# Patient Record
Sex: Female | Born: 1945 | Race: White | Hispanic: No | Smoking: Former smoker
Health system: Southern US, Community
[De-identification: ages and names within clinical notes are randomized; demographics above are authoritative.]

## PROBLEM LIST (undated history)

## (undated) DIAGNOSIS — J4520 Mild intermittent asthma, uncomplicated: Secondary | ICD-10-CM

## (undated) DIAGNOSIS — K08109 Complete loss of teeth, unspecified cause, unspecified class: Secondary | ICD-10-CM

## (undated) DIAGNOSIS — E119 Type 2 diabetes mellitus without complications: Secondary | ICD-10-CM

## (undated) DIAGNOSIS — K219 Gastro-esophageal reflux disease without esophagitis: Secondary | ICD-10-CM

## (undated) DIAGNOSIS — IMO0002 Reserved for concepts with insufficient information to code with codable children: Secondary | ICD-10-CM

## (undated) DIAGNOSIS — K5909 Other constipation: Secondary | ICD-10-CM

## (undated) DIAGNOSIS — E785 Hyperlipidemia, unspecified: Secondary | ICD-10-CM

## (undated) DIAGNOSIS — I10 Essential (primary) hypertension: Secondary | ICD-10-CM

## (undated) DIAGNOSIS — C679 Malignant neoplasm of bladder, unspecified: Secondary | ICD-10-CM

## (undated) DIAGNOSIS — M1990 Unspecified osteoarthritis, unspecified site: Secondary | ICD-10-CM

## (undated) DIAGNOSIS — Z972 Presence of dental prosthetic device (complete) (partial): Secondary | ICD-10-CM

## (undated) HISTORY — PX: WRIST SURGERY: SHX841

## (undated) HISTORY — PX: BLADDER SURGERY: SHX569

---

## 1968-11-26 HISTORY — PX: CHOLECYSTECTOMY: SHX55

## 1998-11-24 ENCOUNTER — Ambulatory Visit (HOSPITAL_COMMUNITY): Admission: RE | Admit: 1998-11-24 | Discharge: 1998-11-24 | Payer: Self-pay | Admitting: Internal Medicine

## 1999-06-10 ENCOUNTER — Encounter: Payer: Self-pay | Admitting: Emergency Medicine

## 1999-06-10 ENCOUNTER — Emergency Department (HOSPITAL_COMMUNITY): Admission: EM | Admit: 1999-06-10 | Discharge: 1999-06-10 | Payer: Self-pay | Admitting: Emergency Medicine

## 2002-10-19 ENCOUNTER — Encounter: Admission: RE | Admit: 2002-10-19 | Discharge: 2003-01-17 | Payer: Self-pay | Admitting: Internal Medicine

## 2002-12-05 ENCOUNTER — Other Ambulatory Visit: Admission: RE | Admit: 2002-12-05 | Discharge: 2002-12-05 | Payer: Self-pay | Admitting: Internal Medicine

## 2003-04-28 ENCOUNTER — Encounter: Admission: RE | Admit: 2003-04-28 | Discharge: 2003-04-28 | Payer: Self-pay | Admitting: Internal Medicine

## 2003-04-28 ENCOUNTER — Encounter: Payer: Self-pay | Admitting: Internal Medicine

## 2005-04-19 ENCOUNTER — Other Ambulatory Visit: Admission: RE | Admit: 2005-04-19 | Discharge: 2005-04-19 | Payer: Self-pay | Admitting: Internal Medicine

## 2005-09-19 ENCOUNTER — Encounter: Admission: RE | Admit: 2005-09-19 | Discharge: 2005-09-19 | Payer: Self-pay | Admitting: Internal Medicine

## 2006-08-08 ENCOUNTER — Other Ambulatory Visit: Admission: RE | Admit: 2006-08-08 | Discharge: 2006-08-08 | Payer: Self-pay | Admitting: Internal Medicine

## 2007-08-28 ENCOUNTER — Other Ambulatory Visit: Admission: RE | Admit: 2007-08-28 | Discharge: 2007-08-28 | Payer: Self-pay | Admitting: *Deleted

## 2008-09-09 ENCOUNTER — Other Ambulatory Visit: Admission: RE | Admit: 2008-09-09 | Discharge: 2008-09-09 | Payer: Self-pay | Admitting: Internal Medicine

## 2009-09-09 ENCOUNTER — Other Ambulatory Visit: Admission: RE | Admit: 2009-09-09 | Discharge: 2009-09-09 | Payer: Self-pay | Admitting: Geriatric Medicine

## 2010-09-11 ENCOUNTER — Other Ambulatory Visit: Admission: RE | Admit: 2010-09-11 | Discharge: 2010-09-11 | Payer: Self-pay | Admitting: Internal Medicine

## 2014-05-21 ENCOUNTER — Other Ambulatory Visit: Payer: Self-pay | Admitting: Urology

## 2014-05-21 ENCOUNTER — Encounter (HOSPITAL_COMMUNITY): Payer: Self-pay | Admitting: Pharmacy Technician

## 2014-05-21 ENCOUNTER — Other Ambulatory Visit (HOSPITAL_COMMUNITY): Payer: Self-pay | Admitting: *Deleted

## 2014-05-21 ENCOUNTER — Encounter (HOSPITAL_COMMUNITY): Payer: Self-pay | Admitting: *Deleted

## 2014-05-24 ENCOUNTER — Encounter (HOSPITAL_COMMUNITY): Payer: Self-pay

## 2014-05-24 ENCOUNTER — Encounter (HOSPITAL_COMMUNITY): Payer: PRIVATE HEALTH INSURANCE | Admitting: Certified Registered Nurse Anesthetist

## 2014-05-24 ENCOUNTER — Ambulatory Visit (HOSPITAL_COMMUNITY): Payer: PRIVATE HEALTH INSURANCE

## 2014-05-24 ENCOUNTER — Encounter (HOSPITAL_COMMUNITY)
Admission: RE | Disposition: A | Discharge status: Home / Self Care | Payer: Self-pay | Source: Ambulatory Visit | Attending: Urology

## 2014-05-24 ENCOUNTER — Ambulatory Visit (HOSPITAL_COMMUNITY)
Admission: RE | Admit: 2014-05-24 | Discharge: 2014-05-24 | Disposition: A | Discharge status: Home / Self Care | Payer: PRIVATE HEALTH INSURANCE | Source: Ambulatory Visit | Attending: Urology | Admitting: Urology

## 2014-05-24 ENCOUNTER — Ambulatory Visit (HOSPITAL_COMMUNITY): Payer: PRIVATE HEALTH INSURANCE | Admitting: Certified Registered Nurse Anesthetist

## 2014-05-24 DIAGNOSIS — M129 Arthropathy, unspecified: Secondary | ICD-10-CM | POA: Diagnosis not present

## 2014-05-24 DIAGNOSIS — Z7983 Long term (current) use of bisphosphonates: Secondary | ICD-10-CM | POA: Insufficient documentation

## 2014-05-24 DIAGNOSIS — Z87891 Personal history of nicotine dependence: Secondary | ICD-10-CM | POA: Diagnosis not present

## 2014-05-24 DIAGNOSIS — K219 Gastro-esophageal reflux disease without esophagitis: Secondary | ICD-10-CM | POA: Diagnosis not present

## 2014-05-24 DIAGNOSIS — C679 Malignant neoplasm of bladder, unspecified: Secondary | ICD-10-CM | POA: Diagnosis present

## 2014-05-24 DIAGNOSIS — E119 Type 2 diabetes mellitus without complications: Secondary | ICD-10-CM | POA: Diagnosis not present

## 2014-05-24 DIAGNOSIS — J45909 Unspecified asthma, uncomplicated: Secondary | ICD-10-CM | POA: Insufficient documentation

## 2014-05-24 DIAGNOSIS — I10 Essential (primary) hypertension: Secondary | ICD-10-CM | POA: Insufficient documentation

## 2014-05-24 DIAGNOSIS — E785 Hyperlipidemia, unspecified: Secondary | ICD-10-CM | POA: Insufficient documentation

## 2014-05-24 HISTORY — DX: Gastro-esophageal reflux disease without esophagitis: K21.9

## 2014-05-24 HISTORY — DX: Hyperlipidemia, unspecified: E78.5

## 2014-05-24 HISTORY — DX: Essential (primary) hypertension: I10

## 2014-05-24 HISTORY — PX: CYSTOSCOPY/RETROGRADE/URETEROSCOPY: SHX5316

## 2014-05-24 HISTORY — DX: Malignant neoplasm of bladder, unspecified: C67.9

## 2014-05-24 HISTORY — PX: TRANSURETHRAL RESECTION OF BLADDER TUMOR: SHX2575

## 2014-05-24 LAB — GLUCOSE, CAPILLARY
Glucose-Capillary: 112 mg/dL — ABNORMAL HIGH (ref 70–99)
Glucose-Capillary: 122 mg/dL — ABNORMAL HIGH (ref 70–99)

## 2014-05-24 LAB — BASIC METABOLIC PANEL
BUN: 14 mg/dL (ref 6–23)
CO2: 28 mEq/L (ref 19–32)
CREATININE: 0.69 mg/dL (ref 0.50–1.10)
Calcium: 9.1 mg/dL (ref 8.4–10.5)
Chloride: 107 mEq/L (ref 96–112)
GFR calc Af Amer: >90 mL/min (ref >90)
GFR, EST NON AFRICAN AMERICAN: 87 mL/min — AB (ref >90)
GLUCOSE: 116 mg/dL — AB (ref 70–99)
POTASSIUM: 4 meq/L (ref 3.7–5.3)
Sodium: 148 mEq/L — ABNORMAL HIGH (ref 137–147)

## 2014-05-24 LAB — CBC
HCT: 40.4 % (ref 36.0–46.0)
HEMOGLOBIN: 13.3 g/dL (ref 12.0–15.0)
MCH: 26.2 pg (ref 26.0–34.0)
MCHC: 32.9 g/dL (ref 30.0–36.0)
MCV: 79.5 fL (ref 78.0–100.0)
Platelets: 284 10*3/uL (ref 150–400)
RBC: 5.08 MIL/uL (ref 3.87–5.11)
RDW: 13.6 % (ref 11.5–15.5)
WBC: 7.1 10*3/uL (ref 4.0–10.5)

## 2014-05-24 SURGERY — TURBT (TRANSURETHRAL RESECTION OF BLADDER TUMOR)
Anesthesia: General

## 2014-05-24 MED ORDER — MITOMYCIN CHEMO FOR BLADDER INSTILLATION 40 MG
40.0000 mg | Freq: Once | INTRAVENOUS | Status: AC
  Administered 2014-05-24: 40 mg via INTRAVESICAL
  Filled 2014-05-24: qty 40

## 2014-05-24 MED ORDER — FENTANYL CITRATE 0.05 MG/ML IJ SOLN
INTRAMUSCULAR | Status: AC
  Filled 2014-05-24: qty 2

## 2014-05-24 MED ORDER — HYDROCODONE-ACETAMINOPHEN 5-325 MG PO TABS: 1.0000 | ORAL_TABLET | Freq: Once | ORAL | Status: DC | PRN

## 2014-05-24 MED ORDER — IOHEXOL 300 MG/ML  SOLN
INTRAMUSCULAR | Status: DC | PRN
  Administered 2014-05-24: 8 mL via INTRAVENOUS

## 2014-05-24 MED ORDER — MIDAZOLAM HCL 5 MG/5ML IJ SOLN
INTRAMUSCULAR | Status: DC | PRN
  Administered 2014-05-24: 2 mg via INTRAVENOUS

## 2014-05-24 MED ORDER — SODIUM CHLORIDE 0.9 % IR SOLN
Status: DC | PRN
  Administered 2014-05-24: 6000 mL via INTRAVESICAL

## 2014-05-24 MED ORDER — LACTATED RINGERS IV SOLN
INTRAVENOUS | Status: DC | PRN
  Administered 2014-05-24: 07:00:00 via INTRAVENOUS

## 2014-05-24 MED ORDER — FENTANYL CITRATE 0.05 MG/ML IJ SOLN
INTRAMUSCULAR | Status: DC | PRN
  Administered 2014-05-24 (×3): 25 ug via INTRAVENOUS

## 2014-05-24 MED ORDER — STERILE WATER FOR IRRIGATION IR SOLN
Status: DC | PRN
  Administered 2014-05-24: 1000 mL

## 2014-05-24 MED ORDER — MEPERIDINE HCL 50 MG/ML IJ SOLN
6.2500 mg | INTRAMUSCULAR | Status: DC | PRN
Start: 2014-05-24 — End: 2014-05-24

## 2014-05-24 MED ORDER — GENTAMICIN SULFATE 40 MG/ML IJ SOLN
160.0000 mg | INTRAVENOUS | Status: DC
  Filled 2014-05-24: qty 4

## 2014-05-24 MED ORDER — LIDOCAINE HCL 2 % EX GEL
CUTANEOUS | Status: AC
  Filled 2014-05-24: qty 10

## 2014-05-24 MED ORDER — ONDANSETRON HCL 4 MG/2ML IJ SOLN
INTRAMUSCULAR | Status: AC
  Filled 2014-05-24: qty 2

## 2014-05-24 MED ORDER — EPHEDRINE SULFATE 50 MG/ML IJ SOLN
INTRAMUSCULAR | Status: AC
  Filled 2014-05-24: qty 1

## 2014-05-24 MED ORDER — 0.9 % SODIUM CHLORIDE (POUR BTL) OPTIME
TOPICAL | Status: DC | PRN
  Administered 2014-05-24: 1000 mL

## 2014-05-24 MED ORDER — SODIUM CHLORIDE 0.9 % IJ SOLN
INTRAMUSCULAR | Status: AC
  Filled 2014-05-24: qty 10

## 2014-05-24 MED ORDER — PROPOFOL 10 MG/ML IV BOLUS
INTRAVENOUS | Status: AC
  Filled 2014-05-24: qty 20

## 2014-05-24 MED ORDER — BELLADONNA ALKALOIDS-OPIUM 16.2-60 MG RE SUPP
RECTAL | Status: AC
  Filled 2014-05-24: qty 1

## 2014-05-24 MED ORDER — HYDROCODONE-ACETAMINOPHEN 5-325 MG PO TABS: 1.0000 | ORAL_TABLET | ORAL | Status: DC | PRN

## 2014-05-24 MED ORDER — MITOMYCIN CHEMO FOR BLADDER INSTILLATION 40 MG: 40.0000 mg | Freq: Once | INTRAVENOUS | Status: DC

## 2014-05-24 MED ORDER — LIDOCAINE HCL (CARDIAC) 20 MG/ML IV SOLN
INTRAVENOUS | Status: AC
  Filled 2014-05-24: qty 5

## 2014-05-24 MED ORDER — PROMETHAZINE HCL 25 MG/ML IJ SOLN: 6.2500 mg | INTRAMUSCULAR | Status: DC | PRN

## 2014-05-24 MED ORDER — ONDANSETRON HCL 4 MG/2ML IJ SOLN
INTRAMUSCULAR | Status: DC | PRN
  Administered 2014-05-24: 4 mg via INTRAVENOUS

## 2014-05-24 MED ORDER — FENTANYL CITRATE 0.05 MG/ML IJ SOLN
25.0000 ug | INTRAMUSCULAR | Status: DC | PRN
  Administered 2014-05-24 (×2): 25 ug via INTRAVENOUS
  Administered 2014-05-24: 50 ug via INTRAVENOUS

## 2014-05-24 MED ORDER — FENTANYL CITRATE 0.05 MG/ML IJ SOLN
INTRAMUSCULAR | Status: AC
  Filled 2014-05-24: qty 5

## 2014-05-24 MED ORDER — PROPOFOL 10 MG/ML IV BOLUS
INTRAVENOUS | Status: DC | PRN
  Administered 2014-05-24: 170 mg via INTRAVENOUS

## 2014-05-24 MED ORDER — NITROFURANTOIN MONOHYD MACRO 100 MG PO CAPS: 100.0000 mg | ORAL_CAPSULE | Freq: Two times a day (BID) | ORAL | Status: DC

## 2014-05-24 MED ORDER — GENTAMICIN SULFATE 40 MG/ML IJ SOLN
320.0000 mg | INTRAVENOUS | Status: AC
  Administered 2014-05-24: 320 mg via INTRAVENOUS
  Filled 2014-05-24: qty 8

## 2014-05-24 MED ORDER — MIDAZOLAM HCL 2 MG/2ML IJ SOLN
INTRAMUSCULAR | Status: AC
  Filled 2014-05-24: qty 2

## 2014-05-24 MED ORDER — LIDOCAINE HCL (CARDIAC) 20 MG/ML IV SOLN
INTRAVENOUS | Status: DC | PRN
  Administered 2014-05-24: 80 mg via INTRAVENOUS

## 2014-05-24 SURGICAL SUPPLY — 28 items
BAG URINE DRAINAGE (UROLOGICAL SUPPLIES) ×2 IMPLANT
BAG URO CATCHER STRL LF (DRAPE) ×4 IMPLANT
BASKET ZERO TIP NITINOL 2.4FR (BASKET) IMPLANT
BSKT STON RTRVL ZERO TP 2.4FR (BASKET)
CATH FOLEY 2WAY  5CC 16FR SIL (CATHETERS)
CATH FOLEY 2WAY 5CC 16FR SIL (CATHETERS) IMPLANT
CATH FOLEY 2WAY SLVR  5CC 18FR (CATHETERS) ×2
CATH FOLEY 2WAY SLVR 5CC 18FR (CATHETERS) IMPLANT
CATH INTERMIT  6FR 70CM (CATHETERS) IMPLANT
CATH URET 5FR 28IN CONE TIP (BALLOONS)
CATH URET 5FR 70CM CONE TIP (BALLOONS) IMPLANT
DRAPE CAMERA CLOSED 9X96 (DRAPES) ×4 IMPLANT
ELECT REM PT RETURN 9FT ADLT (ELECTROSURGICAL) ×4
ELECTRODE REM PT RTRN 9FT ADLT (ELECTROSURGICAL) ×2 IMPLANT
EVACUATOR MICROVAS BLADDER (UROLOGICAL SUPPLIES) IMPLANT
GLOVE BIOGEL M 8.0 STRL (GLOVE) ×4 IMPLANT
GOWN STRL REUS W/ TWL XL LVL3 (GOWN DISPOSABLE) ×2 IMPLANT
GOWN STRL REUS W/TWL XL LVL3 (GOWN DISPOSABLE) ×8 IMPLANT
KIT ASPIRATION TUBING (SET/KITS/TRAYS/PACK) IMPLANT
LOOPS RESECTOSCOPE DISP (ELECTROSURGICAL) ×2 IMPLANT
MANIFOLD NEPTUNE II (INSTRUMENTS) ×4 IMPLANT
PACK CYSTO (CUSTOM PROCEDURE TRAY) ×4 IMPLANT
PLUG CATH AND CAP STER (CATHETERS) ×2 IMPLANT
SYRINGE IRR TOOMEY STRL 70CC (SYRINGE) IMPLANT
TUBING CONNECTING 10 (TUBING) ×3 IMPLANT
TUBING CONNECTING 10' (TUBING) ×1
WATER STERILE IRR 3000ML UROMA (IV SOLUTION) ×4 IMPLANT
WIRE COONS/BENSON .038X145CM (WIRE) ×2 IMPLANT

## 2014-05-24 NOTE — Op Note (Signed)
PATIENT:  Teresa Rice  PRE-OPERATIVE DIAGNOSIS: History of bladder cancer with 2 papillary recurrences  POST-OPERATIVE DIAGNOSIS: Same  PROCEDURE: Cystoscopy, left retrograde ureteropyelogram with interpretive fluoroscopy, TURBT of 2 lesions, aggregate diameter 2 cm, placement of mitomycin  SURGEON:  Lillette Boxer. Dahlstedt, M.D.  ANESTHESIA:  General  EBL:  Minimal  DRAINS: 18 French Foley catheter  LOCAL MEDICATIONS USED:  None  SPECIMEN:  1. Left ureteral orifice lesion 2. Posterior bladder lesion  INDICATION: Teresa Rice is a 68 year old female who presents at this time for cystoscopy, TURBT, left retrograde and possible ureteroscopy. Her initial TURBT was performed by Dr. Solon Augusta in [place] in 2012. Followup since then has been negative until last week when she was found to have 2 papillary recurrences-one of the left ureteral orifice, one in the midline posterior bladder. She presents at this time for left retrograde to rule out upper tract lesions as well as TURBT.  Description of procedure: The patient was properly identified and marked (if applicable) in the holding area. They were then  taken to the operating room and placed on the table in a supine position. General anesthesia was then administered. Once fully anesthetized the patient was moved to the dorsolithotomy position and the genitalia and perineum were sterilely prepped and draped in standard fashion. An official timeout was then performed.  A 22 French panendoscope was passed into her bladder. Her bladder was inspected circumferentially. There were no trabeculations or foreign bodies. Ureteral orifices were normal in configuration and location. Both the 12 and 70 lenses were used to inspect the bladder. 2 areas of papillary tumors were noted-there was a 4-5 mm lesion just lateral to the left ureteral orifice. There were flat papillary lesions in the midline of the posterior bladder, these were  approximately 15 mm in size. No other lesions were noted. X-rays were taken.  I cannulated the left ureteral orifice with a 6 [place] open-ended catheter. Retrograde ureteropyelogram was performed using Omnipaque. The ureter was of normal diameter. There were no filling defects. There was no stricture. Renal pelvis and calyces were normal.  At this point, with the cold cup biopsy forceps, I resected the lesion at the ureteral orifice. This was sent as left ureteral orifice lesion. I use caution not to resect orifice itself. The tumor was resected totally. Separately, I resected with the cold cup biopsy forceps the posterior bladder wall lesion. It took great caution to perform wide resection here. The biopsy was taken as well. All visible tumor was resected.  At this point, I passed the Bugbee electrode. The posterior bladder wall biopsy site was cauterized, with a wide margin used and cauterizing outside of the resected sites. I also cauterized, and carefully, the biopsy site of the left ureteral orifice, and taking caution not to cauterize the orifice itself. At this point, there was adequate hemostasis.  The bladder was drained. A placed an 16 French Foley catheter. The balloon was filled with 10 cc of water. The catheter was plugged.  The patient was awakened and taken to PACU in stable condition. In the PACU, I placed 40 mg of mitomycin. It would be left indwelling for an hour.    PLAN OF CARE: Discharge to home after PACU  PATIENT DISPOSITION:  PACU - hemodynamically stable.

## 2014-05-24 NOTE — H&P (Signed)
H&P  Chief Complaint: Bladder cancer  History of Present Illness: Teresa Rice is a 68 y.o. year old female who presents today for management of recurrent bladder cancer. She underwent an initial resection in [place] in 2012. She 1st saw me in 2014 for cysto, at which time was negative. Recent surveillance cystoscopy revealed 2 papillary recurrences-1 at her left ureteral orifice and 1 above her trigone.   I have recommended TURBT of her recurrences as well as left RGP/possible ureteroscopy.  Past Medical History  Diagnosis Date  . Hyperlipidemia   . Hypertension   . Asthma     infrequent problem  . Arthritis   . GERD (gastroesophageal reflux disease)   . Bladder cancer     DX 2012  . Diabetes mellitus without complication     Past Surgical History  Procedure Laterality Date  . Bladder surgery  2012  . Cholecystectomy  1970    Home Medications:  Medications Prior to Admission  Medication Sig Dispense Refill  . ALPRAZolam (XANAX) 1 MG tablet Take 0.5-1 mg by mouth 4 (four) times daily - after meals and at bedtime. 0.5 tablet tid and 1 tablet at hs      . alendronate (FOSAMAX) 70 MG tablet Take 70 mg by mouth every Monday. Take with a full glass of water on an empty stomach.      . Calcium-Vitamin D (CALTRATE 600 PLUS-VIT D PO) Take 1 tablet by mouth 2 (two) times daily.      . cetirizine (ZYRTEC) 10 MG tablet Take 10 mg by mouth daily.      Marland Kitchen esomeprazole (NEXIUM) 40 MG capsule Take 40 mg by mouth daily at 12 noon.      Marland Kitchen lisinopril (PRINIVIL,ZESTRIL) 20 MG tablet Take 20 mg by mouth every morning.      . magnesium oxide (MAG-OX) 400 MG tablet Take 400 mg by mouth 2 (two) times daily.      . metFORMIN (GLUCOPHAGE) 500 MG tablet Take 500 mg by mouth 2 (two) times daily with a meal.      . Multiple Vitamin (MULTIVITAMIN WITH MINERALS) TABS tablet Take 1 tablet by mouth daily.      Marland Kitchen olopatadine (PATANOL) 0.1 % ophthalmic solution Place 1 drop into both eyes 2 (two) times daily.       . Omega 3 1000 MG CAPS Take 1,000 mg by mouth daily.      . rosuvastatin (CRESTOR) 10 MG tablet Take 10 mg by mouth every evening.      . sitaGLIPtin (JANUVIA) 100 MG tablet Take 100 mg by mouth every morning.        Allergies:  Allergies  Allergen Reactions  . Celebrex [Celecoxib] Anaphylaxis  . Penicillins Other (See Comments)    Burns marks   . Sulfa Antibiotics Itching    History reviewed. No pertinent family history.  Social History:  reports that she quit smoking about 20 years ago. She does not have any smokeless tobacco history on file. She reports that she drinks alcohol. She reports that she does not use illicit drugs.  ROS: A complete review of systems was performed.  All systems are negative except for pertinent findings as noted.  Physical Exam:  Vital signs in last 24 hours:   General:  Alert and oriented, No acute distress HEENT: Normocephalic, atraumatic Neck: No JVD or lymphadenopathy Cardiovascular: Regular rate and rhythm Lungs: Clear bilaterally Abdomen: Soft, nontender, nondistended, no abdominal masses Back: No CVA tenderness Extremities: No edema Neurologic:  Grossly intact  Laboratory Data:  No results found for this or any previous visit (from the past 24 hour(s)). No results found for this or any previous visit (from the past 240 hour(s)). Creatinine: No results found for this basename: CREATININE,  in the last 168 hours  Radiologic Imaging: No results found.  Impression/Assessment:  Recurrent NMIBC  Plan:  Cystoscopy, left RGP/possible ureteroscopy, TURBT  DAHLSTEDT, STEPHEN M 05/24/2014, 5:34 AM  Lillette Boxer. Dahlstedt MD

## 2014-05-24 NOTE — Discharge Instructions (Signed)
Teresa Rice  DOB Oct 27, 2046 DOS 05/24/14  PT INSTRUCTED TO ARRIVE AT [facility] ON 05/24/14 AT 5:15 AM  INSTRUCTED NPO AFTER MIDNIGHT EXCEPT TO TAKE [place]  INSTRUCTED ON BETASEPT PROTOCOL  D REED RN Transurethral Resection of Bladder Tumor (TURBT)  Definition:  Transurethral Resection of the Bladder Tumor is a surgical procedure used to diagnose and remove tumors within the bladder. TURBT is the most common treatment for early stage bladder cancer.   General instructions:  Your recent bladder surgery requires very little post hospital care but some definite precautions.  Despite the fact that no skin incisions were used, the area around the tumor removal site is raw and covered with scabs to promote healing and prevent bleeding. Certain precautions are needed to insure that the scabs are not disturbed over the next 2-4 weeks while the healing proceeds.  Because the raw surface inside your bladder and the irritating effects of urine you may expect frequency of urination and/or urgency (a stronger desire to urinate) and perhaps even getting up at night more often. This will usually resolve or improve slowly over the healing period. You may see some blood in your urine over the first 6 weeks. Do not be alarmed, even if the urine was clear for a while. Get off your feet and drink lots of fluids until clearing occurs. If you start to pass clots or don't improve call us.  Remove the catheter as instructed on Tuesday morning. Diet:  You may return to your normal diet immediately. Because of the raw surface of your bladder, alcohol, spicy foods, foods high in acid and drinks with caffeine may cause irritation or frequency and should be used in moderation. To keep your urine flowing freely and avoid constipation, drink plenty of fluids during the day (8-10 glasses). Tip: Avoid cranberry juice because it is very acidic.   Activity:  Your physical activity  needs to be restricted somewhat.  We suggest that you reduce your activity under the circumstances until the bleeding has stopped. Heavy lifting (greater than 20 lbs.) and heavy exertion should be limited for 2-3 weeks.  Bowels:  It is important to keep your bowels regular during the postoperative period. Straining with bowel movements can cause bleeding. A bowel movement every other day is reasonable. Use a mild laxative if needed, such as milk of magnesia 2-3 tablespoons, or 2 Dulcolax tablets. Call if you continue to have problems. If you had been taking narcotics for pain, before, during or after your surgery, you may be constipated.   Medication:  You should resume your pre-surgery medications unless told not to. In addition you may be given an antibiotic to prevent or treat infection. Antibiotics are not always necessary. All medication should be taken as prescribed until the bottles are finished unless you are having an unusual reaction to one of the drugs.  General Anesthetic, Adult  A doctor specialized in giving anesthesia (anesthesiologist) or a nurse specialized in giving anesthesia (nurse anesthetist) gives medicine that makes you sleep while a procedure is performed (general anesthetic). Once the general anesthetic has been administered, you will be in a sleeplike state in which you feel no pain. After having a general anesthetic you may feel:  Dizzy.  Weak.  Drowsy.  Confused.  These feelings are normal and can be expected to last for up to 24 hours after the procedure.   LET YOUR CAREGIVER KNOW ABOUT:  Allergies you have.  Medications you are taking, including herbs, eye drops, over the counter medications, dietary supplements, and creams.  Previous problems you have had with anesthetics or numbing medicines.  Use of cigarettes, alcohol, or illicit drugs.  Possibility of pregnancy, if this applies.  History of bleeding or blood disorders, including blood clots and clotting  disorders.  Previous surgeries you have had and types of anesthetics you have received.  Family medical history, especially anesthetic problems.  Other health problems.   AFTER THE PROCEDURE  After surgery, you will be taken to the recovery area where a nurse will monitor your progress. You will be allowed to go home when you are awake, stable, taking fluids well, and without serious pain or complications.  For the first 24 hours following an anesthetic:  Have a responsible person with you.  Do not drive a car. If you are alone, do not take public transportation.  Do not engage in strenuous activity. You may usually resume normal activities the next day, or as advised by your caregiver.  Do not drink alcohol.  Do not take medicine that has not been prescribed by your caregiver.  Do not sign important papers or make important decisions as your judgement may be impaired.  You may resume a normal diet as directed.  Change bandages (dressings) as directed.  Only take over-the-counter or prescription medicines for pain, discomfort, or fever as directed by your caregiver.  If you have questions or problems that seem related to the anesthetic, call the hospital and ask for the anesthetist, anesthesiologist, or anesthesia department.   SEEK IMMEDIATE MEDICAL CARE IF:  You develop a rash.  You have difficulty breathing.  You have chest pain.  You have allergic problems.  You have uncontrolled nausea.  You have uncontrolled vomiting.  You develop any serious bleeding, especially from the incision site.  Document Released: 02/19/2008 Document Revised: 07/25/2011 Document Reviewed: 03/15/2011  [facility] Patient Information 2012 [place].

## 2014-05-24 NOTE — Anesthesia Postprocedure Evaluation (Signed)
  Anesthesia Post-op Note  Patient: Teresa Rice  Procedure(s) Performed: Procedure(s) (LRB): TRANSURETHRAL RESECTION OF BLADDER TUMOR (TURBT)  (N/A) CYSTOSCOPY/RETROGRADE/ POSSIBLE URETEROSCOPY (Left)  Patient Location: PACU  Anesthesia Type: General  Level of Consciousness: awake and alert   Airway and Oxygen Therapy: Patient Spontanous Breathing  Post-op Pain: mild  Post-op Assessment: Post-op Vital signs reviewed, Patient's Cardiovascular Status Stable, Respiratory Function Stable, Patent Airway and No signs of Nausea or vomiting  Last Vitals:  Filed Vitals:   05/24/14 1117  BP: 133/64  Pulse: 78  Temp:   Resp: 16    Post-op Vital Signs: stable   Complications: No apparent anesthesia complications

## 2014-05-24 NOTE — Transfer of Care (Signed)
Immediate Anesthesia Transfer of Care Note  Patient: Teresa Rice  Procedure(s) Performed: Procedure(s): TRANSURETHRAL RESECTION OF BLADDER TUMOR (TURBT)  (N/A) CYSTOSCOPY/RETROGRADE/ POSSIBLE URETEROSCOPY (Left)  Patient Location: PACU  Anesthesia Type:General  Level of Consciousness: awake, alert , oriented and patient cooperative  Airway & Oxygen Therapy: Patient Spontanous Breathing and Patient connected to face mask oxygen  Post-op Assessment: Report given to PACU RN, Post -op Vital signs reviewed and stable and Patient moving all extremities  Post vital signs: Reviewed and stable  Complications: No apparent anesthesia complications

## 2014-05-24 NOTE — Anesthesia Preprocedure Evaluation (Addendum)
Anesthesia Evaluation  Patient identified by MRN, date of birth, ID band Patient awake    Reviewed: Allergy & Precautions, H&P , NPO status , Patient's Chart, lab work & pertinent test results  Airway Mallampati: II TM Distance: >3 FB Neck ROM: Full    Dental no notable dental hx. (+) Edentulous Lower, Edentulous Upper   Pulmonary asthma , former smoker,  breath sounds clear to auscultation  Pulmonary exam normal       Cardiovascular hypertension, Pt. on medications Rhythm:Regular Rate:Normal     Neuro/Psych negative neurological ROS  negative psych ROS   GI/Hepatic negative GI ROS, Neg liver ROS,   Endo/Other  diabetes  Renal/GU negative Renal ROS  negative genitourinary   Musculoskeletal negative musculoskeletal ROS (+)   Abdominal   Peds negative pediatric ROS (+)  Hematology negative hematology ROS (+)   Anesthesia Other Findings   Reproductive/Obstetrics negative OB ROS                          Anesthesia Physical Anesthesia Plan  ASA: III  Anesthesia Plan: General   Post-op Pain Management:    Induction: Intravenous  Airway Management Planned: LMA  Additional Equipment:   Intra-op Plan:   Post-operative Plan: Extubation in OR  Informed Consent: I have reviewed the patients History and Physical, chart, labs and discussed the procedure including the risks, benefits and alternatives for the proposed anesthesia with the patient or authorized representative who has indicated his/her understanding and acceptance.   Dental advisory given  Plan Discussed with: CRNA  Anesthesia Plan Comments:         Anesthesia Quick Evaluation

## 2014-05-24 NOTE — Progress Notes (Signed)
pacu note:  Dentures and glasses returned to patient in pacu per patient request.

## 2014-05-25 ENCOUNTER — Encounter (HOSPITAL_COMMUNITY): Payer: Self-pay | Admitting: Urology

## 2015-07-22 ENCOUNTER — Other Ambulatory Visit: Payer: Self-pay | Admitting: Urology

## 2015-07-28 ENCOUNTER — Encounter (HOSPITAL_BASED_OUTPATIENT_CLINIC_OR_DEPARTMENT_OTHER): Payer: Self-pay | Admitting: *Deleted

## 2015-08-02 ENCOUNTER — Encounter (HOSPITAL_BASED_OUTPATIENT_CLINIC_OR_DEPARTMENT_OTHER): Payer: Self-pay | Admitting: *Deleted

## 2015-08-02 NOTE — Progress Notes (Signed)
NPO AFTER MN.  ARRIVE AT 0700,.  NEEDS ISTAT 8 AND EKG.   WILL TAKE XANAX, ZYRTEC, AND ZANTAC AM DOS W/ SIPS OF WATER.

## 2015-08-03 NOTE — H&P (Signed)
  H&P  Chief Complaint: Bladder cancer  History of Present Illness: Teresa Rice is a 69 y.o. year old female who presents for TURBT of recurrent bladder cancer.  Past Medical History  Diagnosis Date  . Hyperlipidemia   . Hypertension   . GERD (gastroesophageal reflux disease)   . Bladder cancer first dx 2012    Recurrent Bladder Cancer--  (urologist-  dr Diona Fanti)  . Mild intermittent asthma   . Type 2 diabetes mellitus   . Chronic constipation   . OA (osteoarthritis)     fingers   . Trigger finger of both hands     wear slints and special gloves at night  . Full dentures     Past Surgical History  Procedure Laterality Date  . Bladder surgery  2012   in [place]    TURBT  . Cholecystectomy  1970  . Transurethral resection of bladder tumor N/A 05/24/2014    Procedure: TRANSURETHRAL RESECTION OF BLADDER TUMOR (TURBT) ;  Surgeon: Jorja Loa, MD;  Location: [facility];  Service: Urology;  Laterality: N/A;  . Cystoscopy/retrograde/ureteroscopy Left 05/24/2014    Procedure: CYSTOSCOPY/RETROGRADE/ URETEROSCOPY;  Surgeon: Jorja Loa, MD;  Location: [facility];  Service: Urology;  Laterality: Left;    Home Medications:  No prescriptions prior to admission    Allergies:  Allergies  Allergen Reactions  . Celebrex [Celecoxib] Anaphylaxis  . Glipizide Itching  . Penicillins Other (See Comments)    "Burn marks from inside out"   . Sulfa Antibiotics Itching    History reviewed. No pertinent family history.  Social History:  reports that she quit smoking about 21 years ago. Her smoking use included Cigarettes. She quit after 30 years of use. She has never used smokeless tobacco. She reports that she drinks alcohol. She reports that she does not use illicit drugs.  ROS: A complete review of systems was performed.  All systems are negative except for pertinent findings as noted.  Physical Exam:  Vital signs in last 24 hours:   General:  Alert and oriented, No  acute distress HEENT: Normocephalic, atraumatic Neck: No JVD or lymphadenopathy Cardiovascular: Regular rate and rhythm Lungs: Clear bilaterally Abdomen: Soft, nontender, nondistended, no abdominal masses Back: No CVA tenderness Extremities: No edema Neurologic: Grossly intact  Laboratory Data:  No results found for this or any previous visit (from the past 24 hour(s)). No results found for this or any previous visit (from the past 240 hour(s)). Creatinine: No results for input(s): CREATININE in the last 168 hours.  Radiologic Imaging: No results found.  Impression/Assessment:  Recurrent bladder cancer  Plan:  TURBT  Jorja Loa 08/03/2015, 8:37 PM  Lillette Boxer. Alysse Rathe MD

## 2015-08-04 ENCOUNTER — Encounter (HOSPITAL_BASED_OUTPATIENT_CLINIC_OR_DEPARTMENT_OTHER)
Admission: RE | Disposition: A | Discharge status: Home / Self Care | Payer: Self-pay | Source: Ambulatory Visit | Attending: Urology

## 2015-08-04 ENCOUNTER — Ambulatory Visit (HOSPITAL_BASED_OUTPATIENT_CLINIC_OR_DEPARTMENT_OTHER)
Admission: RE | Admit: 2015-08-04 | Discharge: 2015-08-04 | Disposition: A | Discharge status: Home / Self Care | Payer: Medicare Other | Source: Ambulatory Visit | Attending: Urology | Admitting: Urology

## 2015-08-04 ENCOUNTER — Other Ambulatory Visit: Payer: Self-pay

## 2015-08-04 ENCOUNTER — Ambulatory Visit (HOSPITAL_BASED_OUTPATIENT_CLINIC_OR_DEPARTMENT_OTHER): Payer: Medicare Other | Admitting: Anesthesiology

## 2015-08-04 ENCOUNTER — Encounter (HOSPITAL_BASED_OUTPATIENT_CLINIC_OR_DEPARTMENT_OTHER): Payer: Self-pay | Admitting: *Deleted

## 2015-08-04 DIAGNOSIS — I10 Essential (primary) hypertension: Secondary | ICD-10-CM | POA: Insufficient documentation

## 2015-08-04 DIAGNOSIS — N3020 Other chronic cystitis without hematuria: Secondary | ICD-10-CM | POA: Insufficient documentation

## 2015-08-04 DIAGNOSIS — E785 Hyperlipidemia, unspecified: Secondary | ICD-10-CM | POA: Insufficient documentation

## 2015-08-04 DIAGNOSIS — M19049 Primary osteoarthritis, unspecified hand: Secondary | ICD-10-CM | POA: Insufficient documentation

## 2015-08-04 DIAGNOSIS — K219 Gastro-esophageal reflux disease without esophagitis: Secondary | ICD-10-CM | POA: Diagnosis not present

## 2015-08-04 DIAGNOSIS — J4520 Mild intermittent asthma, uncomplicated: Secondary | ICD-10-CM | POA: Diagnosis not present

## 2015-08-04 DIAGNOSIS — Z87891 Personal history of nicotine dependence: Secondary | ICD-10-CM | POA: Diagnosis not present

## 2015-08-04 DIAGNOSIS — C679 Malignant neoplasm of bladder, unspecified: Secondary | ICD-10-CM | POA: Insufficient documentation

## 2015-08-04 DIAGNOSIS — E119 Type 2 diabetes mellitus without complications: Secondary | ICD-10-CM | POA: Diagnosis not present

## 2015-08-04 DIAGNOSIS — C674 Malignant neoplasm of posterior wall of bladder: Secondary | ICD-10-CM

## 2015-08-04 HISTORY — DX: Other constipation: K59.09

## 2015-08-04 HISTORY — DX: Reserved for concepts with insufficient information to code with codable children: IMO0002

## 2015-08-04 HISTORY — DX: Mild intermittent asthma, uncomplicated: J45.20

## 2015-08-04 HISTORY — PX: TRANSURETHRAL RESECTION OF BLADDER TUMOR: SHX2575

## 2015-08-04 HISTORY — DX: Type 2 diabetes mellitus without complications: E11.9

## 2015-08-04 HISTORY — DX: Complete loss of teeth, unspecified cause, unspecified class: K08.109

## 2015-08-04 HISTORY — DX: Unspecified osteoarthritis, unspecified site: M19.90

## 2015-08-04 HISTORY — DX: Presence of dental prosthetic device (complete) (partial): Z97.2

## 2015-08-04 LAB — GLUCOSE, CAPILLARY: GLUCOSE-CAPILLARY: 124 mg/dL — AB (ref 65–99)

## 2015-08-04 LAB — POCT I-STAT, CHEM 8
BUN: 20 mg/dL (ref 6–20)
CREATININE: 0.7 mg/dL (ref 0.44–1.00)
Calcium, Ion: 1.29 mmol/L (ref 1.13–1.30)
Chloride: 103 mmol/L (ref 101–111)
GLUCOSE: 129 mg/dL — AB (ref 65–99)
HCT: 39 % (ref 36.0–46.0)
HEMOGLOBIN: 13.3 g/dL (ref 12.0–15.0)
POTASSIUM: 4 mmol/L (ref 3.5–5.1)
Sodium: 140 mmol/L (ref 135–145)
TCO2: 23 mmol/L (ref 0–100)

## 2015-08-04 SURGERY — TURBT (TRANSURETHRAL RESECTION OF BLADDER TUMOR)
Anesthesia: General | Site: Bladder

## 2015-08-04 MED ORDER — LACTATED RINGERS IV SOLN
INTRAVENOUS | Status: DC
  Administered 2015-08-04: 08:00:00 via INTRAVENOUS
  Filled 2015-08-04: qty 1000

## 2015-08-04 MED ORDER — NITROFURANTOIN MONOHYD MACRO 100 MG PO CAPS: 100.0000 mg | ORAL_CAPSULE | Freq: Two times a day (BID) | ORAL | Status: DC

## 2015-08-04 MED ORDER — FENTANYL CITRATE (PF) 100 MCG/2ML IJ SOLN
INTRAMUSCULAR | Status: AC
  Filled 2015-08-04: qty 4

## 2015-08-04 MED ORDER — FENTANYL CITRATE (PF) 100 MCG/2ML IJ SOLN
INTRAMUSCULAR | Status: DC | PRN
  Administered 2015-08-04: 50 ug via INTRAVENOUS

## 2015-08-04 MED ORDER — HYDROCODONE-ACETAMINOPHEN 5-325 MG PO TABS
ORAL_TABLET | ORAL | Status: AC
  Filled 2015-08-04: qty 1

## 2015-08-04 MED ORDER — HYDROCODONE-ACETAMINOPHEN 5-325 MG PO TABS: 1.0000 | ORAL_TABLET | ORAL | Status: DC | PRN

## 2015-08-04 MED ORDER — ONDANSETRON HCL 4 MG/2ML IJ SOLN
INTRAMUSCULAR | Status: DC | PRN
  Administered 2015-08-04: 4 mg via INTRAVENOUS

## 2015-08-04 MED ORDER — GENTAMICIN SULFATE 40 MG/ML IJ SOLN
5.0000 mg/kg | INTRAVENOUS | Status: AC
  Administered 2015-08-04: 320 mg via INTRAVENOUS
  Filled 2015-08-04: qty 8

## 2015-08-04 MED ORDER — HYDROMORPHONE HCL 1 MG/ML IJ SOLN
0.2500 mg | INTRAMUSCULAR | Status: DC | PRN
  Filled 2015-08-04: qty 1

## 2015-08-04 MED ORDER — HYDROMORPHONE HCL 1 MG/ML IJ SOLN
INTRAMUSCULAR | Status: AC
  Filled 2015-08-04: qty 1

## 2015-08-04 MED ORDER — LIDOCAINE HCL (CARDIAC) 20 MG/ML IV SOLN
INTRAVENOUS | Status: DC | PRN
  Administered 2015-08-04: 60 mg via INTRAVENOUS

## 2015-08-04 MED ORDER — STERILE WATER FOR IRRIGATION IR SOLN
Status: DC | PRN
  Administered 2015-08-04: 3000 mL

## 2015-08-04 MED ORDER — DEXAMETHASONE SODIUM PHOSPHATE 4 MG/ML IJ SOLN
INTRAMUSCULAR | Status: DC | PRN
  Administered 2015-08-04: 10 mg via INTRAVENOUS

## 2015-08-04 MED ORDER — MIDAZOLAM HCL 5 MG/5ML IJ SOLN
INTRAMUSCULAR | Status: DC | PRN
  Administered 2015-08-04: 2 mg via INTRAVENOUS

## 2015-08-04 MED ORDER — PROPOFOL 10 MG/ML IV BOLUS
INTRAVENOUS | Status: DC | PRN
  Administered 2015-08-04: 140 mg via INTRAVENOUS

## 2015-08-04 MED ORDER — HYDROCODONE-ACETAMINOPHEN 5-325 MG PO TABS
1.0000 | ORAL_TABLET | Freq: Four times a day (QID) | ORAL | Status: DC | PRN
  Administered 2015-08-04: 1 via ORAL
  Filled 2015-08-04: qty 2

## 2015-08-04 MED ORDER — MEPERIDINE HCL 25 MG/ML IJ SOLN
6.2500 mg | INTRAMUSCULAR | Status: DC | PRN
  Filled 2015-08-04: qty 1

## 2015-08-04 MED ORDER — OXYBUTYNIN CHLORIDE 5 MG PO TABS: 5.0000 mg | ORAL_TABLET | Freq: Three times a day (TID) | ORAL | Status: DC | PRN

## 2015-08-04 MED ORDER — MIDAZOLAM HCL 2 MG/2ML IJ SOLN
INTRAMUSCULAR | Status: AC
  Filled 2015-08-04: qty 2

## 2015-08-04 MED ORDER — PROMETHAZINE HCL 25 MG/ML IJ SOLN
6.2500 mg | INTRAMUSCULAR | Status: DC | PRN
  Filled 2015-08-04: qty 1

## 2015-08-04 SURGICAL SUPPLY — 34 items
BAG DRAIN URO-CYSTO SKYTR STRL (DRAIN) ×4 IMPLANT
BAG DRN ANRFLXCHMBR STRAP LEK (BAG) ×2
BAG DRN UROCATH (DRAIN) ×2
BAG URINE DRAINAGE (UROLOGICAL SUPPLIES) IMPLANT
BAG URINE LEG 19OZ MD ST LTX (BAG) ×3 IMPLANT
CANISTER SUCT LVC 12 LTR MEDI- (MISCELLANEOUS) IMPLANT
CATH FOLEY 2WAY SLVR  5CC 20FR (CATHETERS)
CATH FOLEY 2WAY SLVR  5CC 22FR (CATHETERS)
CATH FOLEY 2WAY SLVR  5CC 24FR (CATHETERS) ×2
CATH FOLEY 2WAY SLVR 5CC 20FR (CATHETERS) IMPLANT
CATH FOLEY 2WAY SLVR 5CC 22FR (CATHETERS) IMPLANT
CATH FOLEY 2WAY SLVR 5CC 24FR (CATHETERS) ×1 IMPLANT
CATH FOLEY 3WAY 30CC 22F (CATHETERS) IMPLANT
CLOTH BEACON ORANGE TIMEOUT ST (SAFETY) ×4 IMPLANT
ELECT BUTTON BIOP 24F 90D PLAS (MISCELLANEOUS) IMPLANT
ELECT REM PT RETURN 9FT ADLT (ELECTROSURGICAL) ×4
ELECTRODE REM PT RTRN 9FT ADLT (ELECTROSURGICAL) ×2 IMPLANT
EVACUATOR MICROVAS BLADDER (UROLOGICAL SUPPLIES) IMPLANT
GLOVE BIO SURGEON STRL SZ8 (GLOVE) ×4 IMPLANT
GLOVE BIOGEL PI IND STRL 6.5 (GLOVE) ×2 IMPLANT
GLOVE BIOGEL PI INDICATOR 6.5 (GLOVE) ×4
GLOVE ECLIPSE 6.5 STRL STRAW (GLOVE) ×3 IMPLANT
GOWN STRL REUS W/ TWL XL LVL3 (GOWN DISPOSABLE) ×3 IMPLANT
GOWN STRL REUS W/TWL XL LVL3 (GOWN DISPOSABLE) ×8
GOWN XL W/COTTON TOWEL STD (GOWNS) ×1 IMPLANT
HOLDER FOLEY CATH W/STRAP (MISCELLANEOUS) IMPLANT
MANIFOLD NEPTUNE II (INSTRUMENTS) ×3 IMPLANT
NEEDLE HYPO 22GX1.5 SAFETY (NEEDLE) IMPLANT
NS IRRIG 500ML POUR BTL (IV SOLUTION) IMPLANT
PACK CYSTO (CUSTOM PROCEDURE TRAY) ×4 IMPLANT
PLUG CATH AND CAP STER (CATHETERS) IMPLANT
SET ASPIRATION TUBING (TUBING) IMPLANT
SYRINGE IRR TOOMEY STRL 70CC (SYRINGE) IMPLANT
WATER STERILE IRR 3000ML UROMA (IV SOLUTION) ×4 IMPLANT

## 2015-08-04 NOTE — Op Note (Signed)
PATIENT:  Teresa Rice  PRE-OPERATIVE DIAGNOSIS:   POST-OPERATIVE DIAGNOSIS: Same  PROCEDURE:   SURGEON:  Lillette Boxer. Lynden Flemmer, M.D.  ANESTHESIA:  General  EBL:  Minimal  DRAINS: None  LOCAL MEDICATIONS USED:  None  SPECIMEN:    INDICATION: [name] is a 69 year old female with a history of bladder cancer. Her initial resection was in [place] several years ago. Recently, during routine surveillance cystoscopy, she was noted to have a recurrence. She presents at this time for repeat TURBT. Risks and complications the procedure have been discussed with the patient, as well as the routine use of mitomycin postoperatively. She agrees with the procedure and desires to proceed.  Description of procedure: The patient was properly identified and marked (if applicable) in the holding area. They were then  taken to the operating room and placed on the table in a supine position. General anesthesia was then administered. Once fully anesthetized the patient was moved to the dorsolithotomy position and the genitalia and perineum were sterilely prepped and draped in standard fashion. An official timeout was then performed.  A 23 French cystoscope was passed into her bladder. The bladder was inspected circumferentially. No foreign bodies were identified. Both ureteral orifices were normal in configuration and location. Bladder abnormalities were as follows-a 1 cm papillary tumor located posteriorly on the left, a 6 mm papillary lesion located at the left bladder neck at approximately the 4 clock position, approximately 1 cm below the left ureteral orifice. 1 erythematous lesion without papillary morphology just above and medial to the left ureteral orifice. No other abnormalities were noted.  Using the cold cup biopsy forceps, I totally excise both of the papillary lesions. These were sent, labeled "bladder tumor". When the posterior bladder lesion was removed, a small amount of fat was  evident through the bladder wall after excision. For this reason, I did not feel it mitomycin use was judicious. Bladder lesions/tumors were totally removed with the cold cup biopsy forceps.  I then biopsied the flat erythematous lesion separately. This was sent as "posterior bladder lesion". The entire lesion was removed. I then used the Bugbee electrode to cauterize all biopsy sites. Hemostasis was excellent.  At this point, the scope was removed. An 14 French Foley catheter was then placed to direct drainage. The balloon was filled with 10 mL of water. The patient was awakened and taken to the PACU in stable condition.    PLAN OF CARE: Discharge to home after PACU  PATIENT DISPOSITION:  PACU - hemodynamically stable.

## 2015-08-04 NOTE — Anesthesia Preprocedure Evaluation (Addendum)
Anesthesia Evaluation  Patient identified by MRN, date of birth, ID band Patient awake    Reviewed: Allergy & Precautions, H&P , NPO status , Patient's Chart, lab work & pertinent test results  Airway Mallampati: II  TM Distance: >3 FB Neck ROM: Full    Dental no notable dental hx. (+) Edentulous Lower, Upper Dentures, Dental Advisory Given   Pulmonary asthma , former smoker,    Pulmonary exam normal breath sounds clear to auscultation       Cardiovascular hypertension, Pt. on medications Normal cardiovascular exam Rhythm:Regular Rate:Normal     Neuro/Psych negative neurological ROS  negative psych ROS   GI/Hepatic Neg liver ROS, GERD  Medicated and Controlled,  Endo/Other  diabetes, Well Controlled, Type 2, Oral Hypoglycemic Agents  Renal/GU negative Renal ROS     Musculoskeletal  (+) Arthritis , Osteoarthritis,    Abdominal   Peds  Hematology negative hematology ROS (+)   Anesthesia Other Findings   Reproductive/Obstetrics negative OB ROS                           Anesthesia Physical  Anesthesia Plan  ASA: III  Anesthesia Plan: General   Post-op Pain Management:    Induction: Intravenous  Airway Management Planned: LMA  Additional Equipment:   Intra-op Plan:   Post-operative Plan: Extubation in OR  Informed Consent: I have reviewed the patients History and Physical, chart, labs and discussed the procedure including the risks, benefits and alternatives for the proposed anesthesia with the patient or authorized representative who has indicated his/her understanding and acceptance.   Dental advisory given  Plan Discussed with: CRNA  Anesthesia Plan Comments:         Anesthesia Quick Evaluation

## 2015-08-04 NOTE — Anesthesia Procedure Notes (Signed)
Procedure Name: LMA Insertion Date/Time: 08/04/2015 8:31 AM Performed by: Wanita Chamberlain Pre-anesthesia Checklist: Patient identified, Timeout performed, Emergency Drugs available, Suction available and Patient being monitored Patient Re-evaluated:Patient Re-evaluated prior to inductionOxygen Delivery Method: Circle system utilized Intubation Type: IV induction Ventilation: Mask ventilation without difficulty LMA: LMA inserted LMA Size: 4.0 Placement Confirmation: breath sounds checked- equal and bilateral and positive ETCO2 Tube secured with: Tape Dental Injury: Teeth and Oropharynx as per pre-operative assessment

## 2015-08-04 NOTE — Transfer of Care (Signed)
Immediate Anesthesia Transfer of Care Note  Patient: Teresa Rice  Procedure(s) Performed: Procedure(s): TRANSURETHRAL RESECTION OF BLADDER TUMOR (TURBT) (N/A)  Patient Location: PACU  Anesthesia Type:General  Level of Consciousness: awake, alert , oriented and patient cooperative  Airway & Oxygen Therapy: Patient Spontanous Breathing and Patient connected to face mask oxygen  Post-op Assessment: Report given to RN and Post -op Vital signs reviewed and stable  Post vital signs: Reviewed and stable  Last Vitals:  Filed Vitals:   08/04/15 0706  BP: 139/78  Pulse: 99  Temp: 36.6 C  Resp: 16    Complications: No apparent anesthesia complications

## 2015-08-04 NOTE — Anesthesia Postprocedure Evaluation (Signed)
Anesthesia Post Note  Patient: Teresa Rice  Procedure(s) Performed: Procedure(s) (LRB): TRANSURETHRAL RESECTION OF BLADDER TUMOR (TURBT) (N/A)  Anesthesia type: General  Patient location: PACU  Post pain: Pain level controlled  Post assessment: Post-op Vital signs reviewed  Last Vitals: BP 127/59 mmHg  Pulse 77  Temp(Src) 36.4 C (Oral)  Resp 14  Ht 5\' 4"  (1.626 m)  Wt 168 lb (76.204 kg)  BMI 28.82 kg/m2  SpO2 96%  Post vital signs: Reviewed  Level of consciousness: sedated  Complications: No apparent anesthesia complications

## 2015-08-04 NOTE — Discharge Instructions (Signed)
Transurethral Resection of Bladder Tumor (TURBT)  Definition:  Transurethral Resection of the Bladder Tumor is a surgical procedure used to diagnose and remove tumors within the bladder. TURBT is the most common treatment for early stage bladder cancer.   General instructions:  Your recent bladder surgery requires very little post hospital care but some definite precautions.  Despite the fact that no skin incisions were used, the area around the tumor removal site is raw and covered with scabs to promote healing and prevent bleeding. Certain precautions are needed to insure that the scabs are not disturbed over the next 2-4 weeks while the healing proceeds.  Because the raw surface inside your bladder and the irritating effects of urine you may expect frequency of urination and/or urgency (a stronger desire to urinate) and perhaps even getting up at night more often. This will usually resolve or improve slowly over the healing period. You may see some blood in your urine over the first 6 weeks. Do not be alarmed, even if the urine was clear for a while. Get off your feet and drink lots of fluids until clearing occurs. If you start to pass clots or don't improve call us.   Because of the bladder biopsy, I would prefer the catheter to be left in until Monday, September 12. Remove as instructed by the nurses.  Diet:  You may return to your normal diet immediately. Because of the raw surface of your bladder, alcohol, spicy foods, foods high in acid and drinks with caffeine may cause irritation or frequency and should be used in moderation. To keep your urine flowing freely and avoid constipation, drink plenty of fluids during the day (8-10 glasses). Tip: Avoid cranberry juice because it is very acidic.   Activity:  Your physical activity needs to be restricted somewhat.  We suggest that you reduce your activity under the circumstances until the bleeding has stopped. Heavy lifting (greater than 20 lbs.)  and heavy exertion should be limited for 2-3 weeks.  Bowels:  It is important to keep your bowels regular during the postoperative period. Straining with bowel movements can cause bleeding. A bowel movement every other day is reasonable. Use a mild laxative if needed, such as milk of magnesia 2-3 tablespoons, or 2 Dulcolax tablets. Call if you continue to have problems. If you had been taking narcotics for pain, before, during or after your surgery, you may be constipated.   Medication:  You should resume your pre-surgery medications unless told not to. In addition you may be given an antibiotic to prevent or treat infection. Antibiotics are not always necessary. All medication should be taken as prescribed until the bottles are finished unless you are having an unusual reaction to one of the drugs.  General Anesthetic, Adult  A doctor specialized in giving anesthesia (anesthesiologist) or a nurse specialized in giving anesthesia (nurse anesthetist) gives medicine that makes you sleep while a procedure is performed (general anesthetic). Once the general anesthetic has been administered, you will be in a sleeplike state in which you feel no pain. After having a general anesthetic you may feel:  Dizzy.  Weak.  Drowsy.  Confused.  These feelings are normal and can be expected to last for up to 24 hours after the procedure.   LET YOUR CAREGIVER KNOW ABOUT:  Allergies you have.  Medications you are taking, including herbs, eye drops, over the counter medications, dietary supplements, and creams.  Previous problems you have had with anesthetics or numbing medicines.  Use  of cigarettes, alcohol, or illicit drugs.  Possibility of pregnancy, if this applies.  History of bleeding or blood disorders, including blood clots and clotting disorders.  Previous surgeries you have had and types of anesthetics you have received.  Family medical history, especially anesthetic problems.  Other health problems.     AFTER THE PROCEDURE  After surgery, you will be taken to the recovery area where a nurse will monitor your progress. You will be allowed to go home when you are awake, stable, taking fluids well, and without serious pain or complications.  For the first 24 hours following an anesthetic:  Have a responsible person with you.  Do not drive a car. If you are alone, do not take public transportation.  Do not engage in strenuous activity. You may usually resume normal activities the next day, or as advised by your caregiver.  Do not drink alcohol.  Do not take medicine that has not been prescribed by your caregiver.  Do not sign important papers or make important decisions as your judgement may be impaired.  You may resume a normal diet as directed.  Change bandages (dressings) as directed.  Only take over-the-counter or prescription medicines for pain, discomfort, or fever as directed by your caregiver.  If you have questions or problems that seem related to the anesthetic, call the hospital and ask for the anesthetist, anesthesiologist, or anesthesia department.   SEEK IMMEDIATE MEDICAL CARE IF:  You develop a rash.  You have difficulty breathing.  You have chest pain.  You have allergic problems.  You have uncontrolled nausea.  You have uncontrolled vomiting.  You develop any serious bleeding, especially from the incision site.  Document Released: 02/19/2008 Document Revised: 07/25/2011 Document Reviewed: 03/15/2011  [facility] Patient Information 2012 [place].    Post Anesthesia Home Care Instructions  Activity: Get plenty of rest for the remainder of the day. A responsible adult should stay with you for 24 hours following the procedure.  For the next 24 hours, DO NOT: -Drive a car -Paediatric nurse -Drink alcoholic beverages -Take any medication unless instructed by your physician -Make any legal decisions or sign important papers.  Meals: Start with liquid foods such  as gelatin or soup. Progress to regular foods as tolerated. Avoid greasy, spicy, heavy foods. If nausea and/or vomiting occur, drink only clear liquids until the nausea and/or vomiting subsides. Call your physician if vomiting continues.  Special Instructions/Symptoms: Your throat may feel dry or sore from the anesthesia or the breathing tube placed in your throat during surgery. If this causes discomfort, gargle with warm salt water. The discomfort should disappear within 24 hours.  If you had a scopolamine patch placed behind your ear for the management of post- operative nausea and/or vomiting:  1. The medication in the patch is effective for 72 hours, after which it should be removed.  Wrap patch in a tissue and discard in the trash. Wash hands thoroughly with soap and water. 2. You may remove the patch earlier than 72 hours if you experience unpleasant side effects which may include dry mouth, dizziness or visual disturbances. 3. Avoid touching the patch. Wash your hands with soap and water after contact with the patch.   Indwelling Urinary Catheter Care You have been given a flexible tube (catheter) used to drain the bladder. Catheters are often used when a person has difficulty urinating due to blockage, bleeding, infection, or inability to control bladder or bowel movements (incontinence). A catheter requires daily care to  prevent infection and blockage. HOME CARE INSTRUCTIONS  Do the following to reduce the risk of infection. Antibiotic medicines cannot prevent infections. Limit the number of bacteria entering your bladder  Wash your hands for 2 minutes with soapy water before and after handling the catheter.  Wash your bottom and the entire catheter twice daily, as well as after each bowel movement. Wash the tip of the penis or just above the vaginal opening with soap and warm water, rinse, and then wash the rectal area. Always wash from front to back.  When changing from the leg bag  to overnight bag or from the overnight bag to leg bag, thoroughly clean the end of the catheter where it connects to the tubing with an alcohol wipe.  Clean the leg bag and overnight bag daily after use. Replace your drainage bags weekly.  Always keep the tubing and bag below the level of your bladder. This allows your urine to drain properly. Lifting the bag or tubing above the level of your bladder will cause dirty urine to flow back into your bladder. If you must briefly lift the bag higher than your bladder, pinch the catheter or tubing to prevent backflow.  Drink enough water and fluids to keep your urine clear or pale yellow, or as directed by your caregiver. This will flush bacteria out of the bladder. Protect tissues from injury  Attach the catheter to your leg so there is no tension on the catheter. Use adhesive tape or a leg strap. If you are using adhesive tape, remove any sticky residue left behind by the previous tape you used.  Place your leg bag on your lower leg. Fasten the straps securely and comfortably.  Do not remove the catheter yourself unless you have been instructed how to do so. Keep the urinary pathway open  Check throughout the day to be sure your catheter is working and urine is draining freely. Make sure the tubing does not become kinked.  Do not let the drainage bag overfill. SEEK IMMEDIATE MEDICAL CARE IF:   The catheter becomes blocked. Urine is not draining.  Urine is leaking.  You have any pain.  You have a fever. Document Released: 11/12/2005 Document Revised: 10/29/2012 Document Reviewed: 04/13/2010 [facility] Patient Information 2015 [place], [place]. This information is not intended to replace advice given to you by your health care provider. Make sure you discuss any questions you have with your health care provider.

## 2015-08-05 ENCOUNTER — Encounter (HOSPITAL_BASED_OUTPATIENT_CLINIC_OR_DEPARTMENT_OTHER): Payer: Self-pay | Admitting: Urology

## 2015-10-11 DIAGNOSIS — G5603 Carpal tunnel syndrome, bilateral upper limbs: Secondary | ICD-10-CM | POA: Insufficient documentation

## 2015-10-11 DIAGNOSIS — IMO0002 Reserved for concepts with insufficient information to code with codable children: Secondary | ICD-10-CM | POA: Insufficient documentation

## 2015-10-11 DIAGNOSIS — M19041 Primary osteoarthritis, right hand: Secondary | ICD-10-CM | POA: Insufficient documentation

## 2015-10-11 DIAGNOSIS — M19042 Primary osteoarthritis, left hand: Secondary | ICD-10-CM | POA: Insufficient documentation

## 2016-08-30 DIAGNOSIS — S52502A Unspecified fracture of the lower end of left radius, initial encounter for closed fracture: Secondary | ICD-10-CM

## 2016-08-30 HISTORY — DX: Unspecified fracture of the lower end of left radius, initial encounter for closed fracture: S52.502A

## 2016-09-13 DIAGNOSIS — S62647A Nondisplaced fracture of proximal phalanx of left little finger, initial encounter for closed fracture: Secondary | ICD-10-CM | POA: Insufficient documentation

## 2018-09-26 ENCOUNTER — Ambulatory Visit: Payer: Medicare Other | Admitting: Gastroenterology

## 2018-10-14 ENCOUNTER — Encounter: Payer: Self-pay | Admitting: Family Medicine

## 2018-11-10 ENCOUNTER — Encounter: Payer: Self-pay | Admitting: Emergency Medicine

## 2018-11-10 DIAGNOSIS — F4310 Post-traumatic stress disorder, unspecified: Secondary | ICD-10-CM

## 2018-11-25 ENCOUNTER — Ambulatory Visit (INDEPENDENT_AMBULATORY_CARE_PROVIDER_SITE_OTHER): Payer: Medicare Other | Admitting: Psychiatry

## 2018-11-25 ENCOUNTER — Encounter: Payer: Self-pay | Admitting: Psychiatry

## 2018-11-25 DIAGNOSIS — F4001 Agoraphobia with panic disorder: Secondary | ICD-10-CM

## 2018-11-25 DIAGNOSIS — F5105 Insomnia due to other mental disorder: Secondary | ICD-10-CM

## 2018-11-25 DIAGNOSIS — F4310 Post-traumatic stress disorder, unspecified: Secondary | ICD-10-CM | POA: Diagnosis not present

## 2018-11-25 DIAGNOSIS — G4721 Circadian rhythm sleep disorder, delayed sleep phase type: Secondary | ICD-10-CM

## 2018-11-25 MED ORDER — ALPRAZOLAM 1 MG PO TABS: ORAL_TABLET | ORAL | 5 refills | Status: DC

## 2018-11-25 NOTE — Progress Notes (Signed)
EGYPT WELCOME [id number] Jan 10, 1946 72 y.o.  Subjective:   Patient ID:  Teresa Rice is a 72 y.o. (DOB 03-Aug-1946) female.  Chief Complaint:  Chief Complaint  Patient presents with  . Follow-up    Medication management  . Anxiety    HPI Teresa Rice presents to the office today for follow-up of anxiety.  1 year anniversary of B's death.  Affected her mood and caused anxiety.  "over anxious" and poor sleep.  Initial insomnia.  Doesn't sleep late usually.  Going to bed 1:30 and up 6-9 am.  Usually sleeps until 9 AM.  No reason known.  For a couple of months.  No caffeine.  Not aware if something bothering her.  No full panic.  Not drowsy daytime.  Bored often.  Also falling asleep on the couch though she doesn't want to do it.  No weight loss off Paxil but no weight gain either.   Will be great GM.  Review of Systems:  Review of Systems  HENT: Positive for hearing loss.   Neurological: Negative for tremors and weakness.  Psychiatric/Behavioral: Positive for sleep disturbance. Negative for agitation, behavioral problems, confusion, decreased concentration, dysphoric mood, hallucinations, self-injury and suicidal ideas. The patient is nervous/anxious. The patient is not hyperactive.     Medications: I have reviewed the patient's current medications.  Current Outpatient Medications  Medication Sig Dispense Refill  . ALPRAZolam (XANAX) 1 MG tablet Take 0.5-1 mg by mouth 4 (four) times daily - after meals and at bedtime. 0.5 tablet tid and 1 tablet at hs    . aspirin EC 81 MG tablet Take 81 mg by mouth daily.    . Calcium-Vitamin D (CALTRATE 600 PLUS-VIT D PO) Take 1 tablet by mouth 2 (two) times daily.    . Dulaglutide (TRULICITY) 1.06 YI/9.4WN SOPN Inject into the skin once a week.    . levothyroxine (SYNTHROID, LEVOTHROID) 25 MCG tablet     . lisinopril (PRINIVIL,ZESTRIL) 20 MG tablet Take 20 mg by mouth every morning.    . magnesium oxide (MAG-OX) 400 MG tablet Take 400  mg by mouth 2 (two) times daily.    . metFORMIN (GLUCOPHAGE) 500 MG tablet Take 500 mg by mouth 3 (three) times daily.     . Multiple Vitamin (MULTIVITAMIN WITH MINERALS) TABS tablet Take 1 tablet by mouth daily.    Marland Kitchen olopatadine (PATANOL) 0.1 % ophthalmic solution Place 1 drop into both eyes 2 (two) times daily.    . Omega 3 1000 MG CAPS Take 1,000 mg by mouth daily.    Marland Kitchen oxybutynin (DITROPAN) 5 MG tablet Take 1 tablet (5 mg total) by mouth every 8 (eight) hours as needed for bladder spasms. 30 tablet 1  . Polyethylene Glycol 3350 (MIRALAX PO) Take by mouth every evening.    . rosuvastatin (CRESTOR) 10 MG tablet Take 10 mg by mouth every evening.    . sitaGLIPtin (JANUVIA) 100 MG tablet Take 100 mg by mouth every morning.    . traMADol (ULTRAM) 50 MG tablet Take by mouth.    . cetirizine (ZYRTEC) 10 MG tablet Take 10 mg by mouth every morning.     Marland Kitchen HYDROcodone-acetaminophen (NORCO) 5-325 MG per tablet Take 1-2 tablets by mouth every 4 (four) hours as needed for moderate pain. (Patient not taking: Reported on 11/25/2018) 20 tablet 0  . nitrofurantoin, macrocrystal-monohydrate, (MACROBID) 100 MG capsule Take 1 capsule (100 mg total) by mouth 2 (two) times daily. (Patient not taking: Reported on 11/25/2018) 10  capsule 0  . ranitidine (ZANTAC) 150 MG tablet Take 150 mg by mouth every morning.     No current facility-administered medications for this visit.     Medication Side Effects: None  Allergies:  Allergies  Allergen Reactions  . Celebrex [Celecoxib] Anaphylaxis  . Glipizide Itching  . Penicillins Other (See Comments)    "Burn marks from inside out"   . Sulfa Antibiotics Itching    Past Medical History:  Diagnosis Date  . Bladder cancer [facility]) first dx 2012   Recurrent Bladder Cancer--  (urologist-  dr Diona Fanti)  . Chronic constipation   . Full dentures   . GERD (gastroesophageal reflux disease)   . Hyperlipidemia   . Hypertension   . Mild intermittent asthma   . OA  (osteoarthritis)    fingers   . Trigger finger of both hands    wear slints and special gloves at night  . Type 2 diabetes mellitus ([place])     History reviewed. No pertinent family history.  Social History   Socioeconomic History  . Marital status: Widowed    Spouse name: Not on file  . Number of children: Not on file  . Years of education: Not on file  . Highest education level: Not on file  Occupational History  . Not on file  Social Needs  . Financial resource strain: Not on file  . Food insecurity:    Worry: Not on file    Inability: Not on file  . Transportation needs:    Medical: Not on file    Non-medical: Not on file  Tobacco Use  . Smoking status: Former Smoker    Years: 30.00    Types: Cigarettes    Last attempt to quit: 05/21/1994    Years since quitting: 24.5  . Smokeless tobacco: Never Used  Substance and Sexual Activity  . Alcohol use: Yes    Comment: RARE  . Drug use: No  . Sexual activity: Not on file  Lifestyle  . Physical activity:    Days per week: Not on file    Minutes per session: Not on file  . Stress: Not on file  Relationships  . Social connections:    Talks on phone: Not on file    Gets together: Not on file    Attends religious service: Not on file    Active member of club or organization: Not on file    Attends meetings of clubs or organizations: Not on file    Relationship status: Not on file  . Intimate partner violence:    Fear of current or ex partner: Not on file    Emotionally abused: Not on file    Physically abused: Not on file    Forced sexual activity: Not on file  Other Topics Concern  . Not on file  Social History Narrative  . Not on file    Past Medical History, Surgical history, Social history, and Family history were reviewed and updated as appropriate.   2 new hearing aids.  Please see review of systems for further details on the patient's review from today.   Objective:   Physical Exam:  There were no  vitals taken for this visit.  Physical Exam Constitutional:      General: She is not in acute distress.    Appearance: She is well-developed.  Musculoskeletal:        General: No deformity.  Neurological:     Mental Status: She is alert and oriented to person,  place, and time.     Motor: No tremor.     Coordination: Coordination normal.     Gait: Gait normal.  Psychiatric:        Attention and Perception: Attention normal.        Mood and Affect: Mood is anxious. Mood is not depressed. Affect is not labile, blunt, angry or inappropriate.        Speech: Speech normal.        Behavior: Behavior normal.        Thought Content: Thought content normal. Thought content does not include homicidal or suicidal ideation. Thought content does not include homicidal or suicidal plan.        Cognition and Memory: Cognition normal.        Judgment: Judgment normal.     Comments: Insight fair.  Lacking in some areas about sleep expectations. No auditory or visual hallucinations. No delusions.      Lab Review:     Component Value Date/Time   NA 140 08/04/2015 0739   K 4.0 08/04/2015 0739   CL 103 08/04/2015 0739   CO2 28 05/24/2014 0620   GLUCOSE 129 (H) 08/04/2015 0739   BUN 20 08/04/2015 0739   CREATININE 0.70 08/04/2015 0739   CALCIUM 9.1 05/24/2014 0620   GFRNONAA 87 (L) 05/24/2014 0620   GFRAA >90 05/24/2014 0620       Component Value Date/Time   WBC 7.1 05/24/2014 0620   RBC 5.08 05/24/2014 0620   HGB 13.3 08/04/2015 0739   HCT 39.0 08/04/2015 0739   PLT 284 05/24/2014 0620   MCV 79.5 05/24/2014 0620   MCH 26.2 05/24/2014 0620   MCHC 32.9 05/24/2014 0620   RDW 13.6 05/24/2014 0620    No results found for: POCLITH, LITHIUM   No results found for: PHENYTOIN, PHENOBARB, VALPROATE, CBMZ   .res Assessment: Plan:    PTSD (post-traumatic stress disorder)  Delayed sleep phase syndrome  Panic disorder with agoraphobia  Insomnia due to mental condition   Sleep hygiene  in detail.  Normalized some of the problem.  She's getting enough sleep.  Delayed sleep phase explained in detail.  If she wants to change this then get up 15 mins earlier per day.   Rec exercise.  Went off SSRI AMA DT weight gain.  Disc it's benefit for anxiety.  Disc SE.  She doesn't want to take more meds like this right now.  Does not think she can tolerate anxiety without Xanax.  A lot of benefit and no SE.  We discussed the short-term risks associated with benzodiazepines including sedation and increased fall risk among others.  Discussed long-term side effect risk including dependence, potential withdrawal symptoms, and the potential eventual dose-related risk of dementia.  Asks to increase HS Xanax.  Already at significant dose for age.  Rec against this.  This appt was 30 mins.  FU 6 mos.  Lynder Parents, MD, DFAPA   Please see After Visit Summary for patient specific instructions.  No future appointments.  No orders of the defined types were placed in this encounter.     -------------------------------

## 2019-01-14 ENCOUNTER — Other Ambulatory Visit: Payer: Self-pay | Admitting: Internal Medicine

## 2019-01-14 DIAGNOSIS — Z1231 Encounter for screening mammogram for malignant neoplasm of breast: Secondary | ICD-10-CM

## 2019-02-06 ENCOUNTER — Ambulatory Visit
Admission: RE | Admit: 2019-02-06 | Discharge: 2019-02-06 | Disposition: A | Discharge status: Home / Self Care | Payer: Medicare Other | Source: Ambulatory Visit | Attending: Internal Medicine | Admitting: Internal Medicine

## 2019-02-06 ENCOUNTER — Other Ambulatory Visit: Payer: Self-pay

## 2019-02-06 DIAGNOSIS — Z1231 Encounter for screening mammogram for malignant neoplasm of breast: Secondary | ICD-10-CM

## 2019-05-05 ENCOUNTER — Encounter: Payer: Self-pay | Admitting: Family Medicine

## 2019-05-05 ENCOUNTER — Ambulatory Visit (INDEPENDENT_AMBULATORY_CARE_PROVIDER_SITE_OTHER): Payer: Medicare Other | Admitting: Family Medicine

## 2019-05-05 ENCOUNTER — Other Ambulatory Visit: Payer: Self-pay

## 2019-05-05 VITALS — Ht 64.0 in | Wt 176.2 lb

## 2019-05-05 DIAGNOSIS — K219 Gastro-esophageal reflux disease without esophagitis: Secondary | ICD-10-CM

## 2019-05-05 DIAGNOSIS — M791 Myalgia, unspecified site: Secondary | ICD-10-CM

## 2019-05-05 DIAGNOSIS — M545 Low back pain: Secondary | ICD-10-CM | POA: Diagnosis not present

## 2019-05-05 DIAGNOSIS — E039 Hypothyroidism, unspecified: Secondary | ICD-10-CM

## 2019-05-05 DIAGNOSIS — G8929 Other chronic pain: Secondary | ICD-10-CM

## 2019-05-05 DIAGNOSIS — I152 Hypertension secondary to endocrine disorders: Secondary | ICD-10-CM

## 2019-05-05 DIAGNOSIS — I10 Essential (primary) hypertension: Secondary | ICD-10-CM

## 2019-05-05 DIAGNOSIS — E785 Hyperlipidemia, unspecified: Secondary | ICD-10-CM

## 2019-05-05 DIAGNOSIS — E1169 Type 2 diabetes mellitus with other specified complication: Secondary | ICD-10-CM

## 2019-05-05 DIAGNOSIS — J302 Other seasonal allergic rhinitis: Secondary | ICD-10-CM

## 2019-05-05 DIAGNOSIS — E1159 Type 2 diabetes mellitus with other circulatory complications: Secondary | ICD-10-CM

## 2019-05-05 DIAGNOSIS — J4540 Moderate persistent asthma, uncomplicated: Secondary | ICD-10-CM

## 2019-05-05 DIAGNOSIS — F4310 Post-traumatic stress disorder, unspecified: Secondary | ICD-10-CM

## 2019-05-05 DIAGNOSIS — E669 Obesity, unspecified: Secondary | ICD-10-CM

## 2019-05-05 MED ORDER — SITAGLIPTIN PHOS-METFORMIN HCL 50-500 MG PO TABS: 1.0000 | ORAL_TABLET | Freq: Two times a day (BID) | ORAL | 1 refills | Status: DC

## 2019-05-05 NOTE — Progress Notes (Signed)
Virtual Visit via Video   Due to the COVID-19 pandemic, this visit was completed with telemedicine (audio/video) technology to reduce patient and provider exposure as well as to preserve personal protective equipment.  I connected with Bynum Bellows by a video enabled telemedicine application and verified that I am speaking with the correct person using two identifiers. Location patient: Home Location provider: [facility], Office Persons participating in the virtual visit: CARLENA RUYBAL, Briscoe Deutscher, DO Lonell Grandchild, CMA acting as scribe for Dr. Briscoe Deutscher.   I discussed the limitations of evaluation and management by telemedicine and the availability of in person appointments. The patient expressed understanding and agreed to proceed.  Care Team   Patient Care Team: Colonel Bald, MD as PCP - General (Internal Medicine)  Subjective:   HPI: Patient presents for NEW PATIENT appointment. Patient was referred by daughter who is patient of Dr. Juleen China as well. She was not comfortable with the level of care that she was receiving at that office. He has been seen by Psychology, urology as well.  She follows up wit psychology once ever six months. She is treated for PTSD.   Diabetes: Patient has recently increased Trulicity to 1.5, metformin three times a day and Januvia. We will call in the combo and see if her insurance covers. If so we will change.  Her fasting blood sugar today today was 170. She thinks her last A1c was 7.6 the time before that it was 6.8. But she was aware that she did not follow her diet as well last few months. That was before she was increased on with the Trulicity. She has been walking daily.   Bladder Cancer: she is in remission for bladder cancer. She is followed by urology.   Back pain: She has ongoing back pian that she takes tramadol as needed. She has increased pain with activity. She will consider PT in the future if needed and will continue  with tramadol as needed.   Review of Systems  Constitutional: Negative for chills and fever.  HENT: Negative for ear pain, hearing loss and tinnitus.   Eyes: Negative for blurred vision, double vision and photophobia.  Respiratory: Negative for cough and hemoptysis.   Cardiovascular: Negative for chest pain, palpitations and leg swelling.  Gastrointestinal: Negative for heartburn, nausea and vomiting.  Genitourinary: Negative for dysuria, frequency and urgency.  Musculoskeletal: Negative for myalgias.  Neurological: Negative for dizziness and headaches.  Psychiatric/Behavioral: Negative for depression and suicidal ideas.    Patient Active Problem List   Diagnosis Date Noted  . Obesity, Class I, BMI 30-34.9 05/06/2019  . Seasonal allergies 05/06/2019  . Gastroesophageal reflux disease 05/06/2019  . Moderate persistent asthma without complication 32/44/0102  . Acquired hypothyroidism, on Levothyoxine 25 mcg daily 05/06/2019  . Chronic bilateral low back pain without sciatica 05/06/2019  . Diabetes mellitus ([place]) 05/06/2019  . Hyperlipidemia associated with type 2 diabetes mellitus ([place]) 05/06/2019  . Hypertension associated with diabetes ([place]) 05/06/2019  . PTSD (post-traumatic stress disorder) 11/10/2018  . Closed nondisplaced fracture of proximal phalanx of left little finger 09/13/2016  . Closed fracture of left distal radius 08/30/2016  . Bilateral carpal tunnel syndrome 10/11/2015  . Primary osteoarthritis of both hands 10/11/2015  . Trigger finger of both hands 10/11/2015    Social History   Tobacco Use  . Smoking status: Former Smoker    Years: 30.00    Types: Cigarettes    Last attempt to quit: 05/21/1994  Years since quitting: 24.9  . Smokeless tobacco: Never Used  Substance Use Topics  . Alcohol use: Yes    Comment: RARE   Current Outpatient Medications:  .  ALPRAZolam (XANAX) 1 MG tablet, 0.5 tablet tid and 1 tablet at hs, Disp: 75 tablet, Rfl: 5 .  aspirin EC  81 MG tablet, Take 81 mg by mouth daily., Disp: , Rfl:  .  Calcium-Vitamin D (CALTRATE 600 PLUS-VIT D PO), Take 1 tablet by mouth 2 (two) times daily., Disp: , Rfl:  .  cetirizine (ZYRTEC) 10 MG tablet, Take 10 mg by mouth every morning. , Disp: , Rfl:  .  Dulaglutide (TRULICITY) 2.37 SE/8.3TD SOPN, Inject into the skin once a week., Disp: , Rfl:  .  HYDROcodone-acetaminophen (NORCO) 5-325 MG per tablet, Take 1-2 tablets by mouth every 4 (four) hours as needed for moderate pain. (Patient not taking: Reported on 11/25/2018), Disp: 20 tablet, Rfl: 0 .  levothyroxine (SYNTHROID, LEVOTHROID) 25 MCG tablet, , Disp: , Rfl:  .  lisinopril (PRINIVIL,ZESTRIL) 20 MG tablet, Take 20 mg by mouth every morning., Disp: , Rfl:  .  magnesium oxide (MAG-OX) 400 MG tablet, Take 400 mg by mouth 2 (two) times daily., Disp: , Rfl:  .  metFORMIN (GLUCOPHAGE) 500 MG tablet, Take 500 mg by mouth 3 (three) times daily. , Disp: , Rfl:  .  Multiple Vitamin (MULTIVITAMIN WITH MINERALS) TABS tablet, Take 1 tablet by mouth daily., Disp: , Rfl:  .  nitrofurantoin, macrocrystal-monohydrate, (MACROBID) 100 MG capsule, Take 1 capsule (100 mg total) by mouth 2 (two) times daily. (Patient not taking: Reported on 11/25/2018), Disp: 10 capsule, Rfl: 0 .  olopatadine (PATANOL) 0.1 % ophthalmic solution, Place 1 drop into both eyes 2 (two) times daily., Disp: , Rfl:  .  Omega 3 1000 MG CAPS, Take 1,000 mg by mouth daily., Disp: , Rfl:  .  oxybutynin (DITROPAN) 5 MG tablet, Take 1 tablet (5 mg total) by mouth every 8 (eight) hours as needed for bladder spasms., Disp: 30 tablet, Rfl: 1 .  Polyethylene Glycol 3350 (MIRALAX PO), Take by mouth every evening., Disp: , Rfl:  .  ranitidine (ZANTAC) 150 MG tablet, Take 150 mg by mouth every morning., Disp: , Rfl:  .  rosuvastatin (CRESTOR) 10 MG tablet, Take 10 mg by mouth every evening., Disp: , Rfl:  .  sitaGLIPtin (JANUVIA) 100 MG tablet, Take 100 mg by mouth every morning., Disp: , Rfl:  .   traMADol (ULTRAM) 50 MG tablet, Take by mouth., Disp: , Rfl:   Allergies  Allergen Reactions  . Celebrex [Celecoxib] Anaphylaxis  . Glipizide Itching  . Penicillins Other (See Comments)    "Burn marks from inside out"   . Sulfa Antibiotics Itching   Objective:   VITALS: Per patient if applicable, see vitals. GENERAL: Alert, appears well and in no acute distress. HEENT: Atraumatic, conjunctiva clear, no obvious abnormalities on inspection of external nose and ears. NECK: Normal movements of the head and neck. CARDIOPULMONARY: No increased WOB. Speaking in clear sentences. I:E ratio WNL.  MS: Moves all visible extremities without noticeable abnormality. PSYCH: Pleasant and cooperative, well-groomed. Speech normal rate and rhythm. Affect is appropriate. Insight and judgement are appropriate. Attention is focused, linear, and appropriate.  NEURO: CN grossly intact. Oriented as arrived to appointment on time with no prompting. Moves both UE equally.  SKIN: No obvious lesions, wounds, erythema, or cyanosis noted on face or hands.  No flowsheet data found.  Assessment and Plan:  Robi was seen today for establish care.  Diagnoses and all orders for this visit:  Hypertension associated with diabetes ([place])  Hyperlipidemia associated with type 2 diabetes mellitus ([place])  Type 2 diabetes mellitus with other specified complication, without long-term current use of insulin ([facility]) -     sitaGLIPtin-metformin (JANUMET) 50-500 MG tablet; Take 1 tablet by mouth 2 (two) times daily with a meal.  Myalgia Comments: Will hold statin to see if improves.   Chronic bilateral low back pain without sciatica  Acquired hypothyroidism, on Levothyoxine 25 mcg daily  Moderate persistent asthma without complication  PTSD (post-traumatic stress disorder), followed by Dr. Clovis Pu  Gastroesophageal reflux disease, esophagitis presence not specified  Seasonal allergies  Obesity, Class I, BMI  30-34.9    . COVID-19 Education: The signs and symptoms of COVID-19 were discussed with the patient and how to seek care for testing if needed. The importance of social distancing was discussed today. . Reviewed expectations re: course of current medical issues. . Discussed self-management of symptoms. . Outlined signs and symptoms indicating need for more acute intervention. . Patient verbalized understanding and all questions were answered. Marland Kitchen Health Maintenance issues including appropriate healthy diet, exercise, and smoking avoidance were discussed with patient. . See orders for this visit as documented in the electronic medical record.  Briscoe Deutscher, DO  Records requested if needed. Time spent: 45 minutes, of which >50% was spent in obtaining information about her symptoms, reviewing her previous labs, evaluations, and treatments, counseling her about her condition (please see the discussed topics above), and developing a plan to further investigate it; she had a number of questions which I addressed.

## 2019-05-06 ENCOUNTER — Encounter: Payer: Self-pay | Admitting: Family Medicine

## 2019-05-06 DIAGNOSIS — E119 Type 2 diabetes mellitus without complications: Secondary | ICD-10-CM | POA: Insufficient documentation

## 2019-05-06 DIAGNOSIS — J4540 Moderate persistent asthma, uncomplicated: Secondary | ICD-10-CM | POA: Insufficient documentation

## 2019-05-06 DIAGNOSIS — E1169 Type 2 diabetes mellitus with other specified complication: Secondary | ICD-10-CM | POA: Insufficient documentation

## 2019-05-06 DIAGNOSIS — E785 Hyperlipidemia, unspecified: Secondary | ICD-10-CM | POA: Insufficient documentation

## 2019-05-06 DIAGNOSIS — J302 Other seasonal allergic rhinitis: Secondary | ICD-10-CM | POA: Insufficient documentation

## 2019-05-06 DIAGNOSIS — G8929 Other chronic pain: Secondary | ICD-10-CM | POA: Insufficient documentation

## 2019-05-06 DIAGNOSIS — E039 Hypothyroidism, unspecified: Secondary | ICD-10-CM | POA: Insufficient documentation

## 2019-05-06 DIAGNOSIS — K219 Gastro-esophageal reflux disease without esophagitis: Secondary | ICD-10-CM | POA: Insufficient documentation

## 2019-05-06 DIAGNOSIS — I152 Hypertension secondary to endocrine disorders: Secondary | ICD-10-CM | POA: Insufficient documentation

## 2019-05-06 DIAGNOSIS — E1159 Type 2 diabetes mellitus with other circulatory complications: Secondary | ICD-10-CM | POA: Insufficient documentation

## 2019-05-06 DIAGNOSIS — M545 Low back pain, unspecified: Secondary | ICD-10-CM | POA: Insufficient documentation

## 2019-05-06 DIAGNOSIS — E669 Obesity, unspecified: Secondary | ICD-10-CM | POA: Insufficient documentation

## 2019-05-19 ENCOUNTER — Other Ambulatory Visit: Payer: Self-pay

## 2019-05-20 ENCOUNTER — Ambulatory Visit: Payer: Medicare Other | Admitting: Family Medicine

## 2019-05-26 ENCOUNTER — Ambulatory Visit (INDEPENDENT_AMBULATORY_CARE_PROVIDER_SITE_OTHER): Payer: Medicare Other | Admitting: Psychiatry

## 2019-05-26 ENCOUNTER — Other Ambulatory Visit: Payer: Self-pay

## 2019-05-26 ENCOUNTER — Encounter: Payer: Self-pay | Admitting: Psychiatry

## 2019-05-26 DIAGNOSIS — F5105 Insomnia due to other mental disorder: Secondary | ICD-10-CM | POA: Diagnosis not present

## 2019-05-26 DIAGNOSIS — G4721 Circadian rhythm sleep disorder, delayed sleep phase type: Secondary | ICD-10-CM

## 2019-05-26 DIAGNOSIS — F4310 Post-traumatic stress disorder, unspecified: Secondary | ICD-10-CM

## 2019-05-26 DIAGNOSIS — F4001 Agoraphobia with panic disorder: Secondary | ICD-10-CM | POA: Diagnosis not present

## 2019-05-26 MED ORDER — ALPRAZOLAM 1 MG PO TABS
ORAL_TABLET | ORAL | 5 refills | Status: DC
Start: 2019-05-26 — End: 2019-08-04

## 2019-05-26 NOTE — Progress Notes (Signed)
Teresa Rice [id number] 1946-09-28 73 y.o.  Subjective:   Patient ID:  Teresa Rice is a 73 y.o. (DOB [birth year]) female.  Chief Complaint:  Chief Complaint  Patient presents with  . Anxiety    med mangement    HPI [name] presents to the office today for follow-up of anxiety.    Last seen December without med changes though she had previously stopped SSRI AMA.  MD had concerns over LT panic,  Get down a little from Covid isolation.  New GGS Elijah 3 mos old.  Sleep better.  Couple panic since here over social matters.  Doing ok with Xanax. Consistent and it works.  1 year anniversary of B's death.  Affected her mood and caused anxiety.  "over anxious" and poor sleep.  Initial insomnia.  Doesn't sleep late usually.  Going to bed 1:30 and up 6-9 am.  Usually sleeps until 9 AM.  No reason known.  For a couple of months.  No caffeine.  Not aware if something bothering her.  No full panic.  Not drowsy daytime.  Bored often.  Also falling asleep on the couch though she doesn't want to do it.  No weight loss off Paxil but no weight gain either.  Overall is not worse evidentally.  New PCP Juleen China  Past Psychiatric Medication Trials:  Paxil 60, trazodone, sertraline forgetfulness She has been under our care since 1998 and has been on the same dosage of Xanax the whole time and most of the time took paroxetine 60   Review of Systems:  Review of Systems  HENT: Positive for hearing loss.   Neurological: Negative for tremors and weakness.  Psychiatric/Behavioral: Negative for agitation, behavioral problems, confusion, decreased concentration, dysphoric mood, hallucinations, self-injury, sleep disturbance and suicidal ideas. The patient is nervous/anxious. The patient is not hyperactive.     Medications: I have reviewed the patient's current medications.  Current Outpatient Medications  Medication Sig Dispense Refill  . ALPRAZolam (XANAX) 1 MG tablet 0.5 tablet tid and 1  tablet at hs 75 tablet 5  . aspirin EC 81 MG tablet Take 81 mg by mouth daily.    . Calcium-Vitamin D (CALTRATE 600 PLUS-VIT D PO) Take 1 tablet by mouth 2 (two) times daily.    . cetirizine (ZYRTEC) 10 MG tablet Take 10 mg by mouth every morning.     . Dulaglutide (TRULICITY) 7.37 TG/6.2IR SOPN Inject 1.5 mg into the skin once a week.     . levothyroxine (SYNTHROID, LEVOTHROID) 25 MCG tablet     . lisinopril (PRINIVIL,ZESTRIL) 20 MG tablet Take 20 mg by mouth every morning.    . magnesium oxide (MAG-OX) 400 MG tablet Take 400 mg by mouth 2 (two) times daily.    . Multiple Vitamin (MULTIVITAMIN WITH MINERALS) TABS tablet Take 1 tablet by mouth daily.    Marland Kitchen olopatadine (PATANOL) 0.1 % ophthalmic solution Place 1 drop into both eyes 2 (two) times daily.    . Omega 3 1000 MG CAPS Take 1,000 mg by mouth daily.    Marland Kitchen omeprazole (PRILOSEC) 20 MG capsule Take 20 mg by mouth daily.    . Polyethylene Glycol 3350 (MIRALAX PO) Take by mouth every evening.    . sitaGLIPtin-metformin (JANUMET) 50-500 MG tablet Take 1 tablet by mouth 2 (two) times daily with a meal. 60 tablet 1  . traMADol (ULTRAM) 50 MG tablet Take by mouth.    . rosuvastatin (CRESTOR) 10 MG tablet Take 10 mg by mouth every  evening.     No current facility-administered medications for this visit.     Medication Side Effects: None  Allergies:  Allergies  Allergen Reactions  . Celebrex [Celecoxib] Anaphylaxis  . Glipizide Itching  . Penicillins Other (See Comments)    "Burn marks from inside out"   . Sulfa Antibiotics Itching    Past Medical History:  Diagnosis Date  . Bladder cancer [facility]) first dx 2012   Recurrent Bladder Cancer--  (urologist-  dr Diona Fanti)  . Chronic constipation   . Full dentures   . GERD (gastroesophageal reflux disease)   . Hyperlipidemia   . Hypertension   . Mild intermittent asthma   . OA (osteoarthritis)    fingers   . Trigger finger of both hands    wear slints and special gloves at night  .  Type 2 diabetes mellitus ([facility])     Family History  Problem Relation Age of Onset  . Early death Mother   . AAA (abdominal aortic aneurysm) Sister     Social History   Socioeconomic History  . Marital status: Widowed    Spouse name: Not on file  . Number of children: Not on file  . Years of education: Not on file  . Highest education level: Not on file  Occupational History  . Not on file  Social Needs  . Financial resource strain: Not on file  . Food insecurity    Worry: Not on file    Inability: Not on file  . Transportation needs    Medical: Not on file    Non-medical: Not on file  Tobacco Use  . Smoking status: Former Smoker    Years: 30.00    Types: Cigarettes    Quit date: 05/21/1994    Years since quitting: 25.0  . Smokeless tobacco: Never Used  Substance and Sexual Activity  . Alcohol use: Yes    Comment: RARE  . Drug use: No  . Sexual activity: Not on file  Lifestyle  . Physical activity    Days per week: 6 days    Minutes per session: 20 min  . Stress: Not on file  Relationships  . Social Herbalist on phone: Not on file    Gets together: Not on file    Attends religious service: Not on file    Active member of club or organization: Not on file    Attends meetings of clubs or organizations: Not on file    Relationship status: Not on file  . Intimate partner violence    Fear of current or ex partner: Not on file    Emotionally abused: Not on file    Physically abused: Not on file    Forced sexual activity: Not on file  Other Topics Concern  . Not on file  Social History Narrative  . Not on file    Past Medical History, Surgical history, Social history, and Family history were reviewed and updated as appropriate.   2 new hearing aids.  Please see review of systems for further details on the patient's review from today.   Objective:   Physical Exam:  There were no vitals taken for this visit.  Physical Exam Constitutional:       General: She is not in acute distress.    Appearance: She is well-developed.  Musculoskeletal:        General: No deformity.  Neurological:     Mental Status: She is alert and oriented to person, place,  and time.     Motor: No tremor.     Coordination: Coordination normal.     Gait: Gait normal.  Psychiatric:        Attention and Perception: Attention normal. She does not perceive auditory hallucinations.        Mood and Affect: Mood is anxious. Mood is not depressed. Affect is not labile, blunt, angry or inappropriate.        Speech: Speech normal.        Behavior: Behavior normal.        Thought Content: Thought content normal. Thought content does not include homicidal or suicidal ideation. Thought content does not include homicidal or suicidal plan.        Cognition and Memory: Cognition normal.        Judgment: Judgment normal.     Comments: Insight fair.  Lacking in some areas about sleep expectations. No auditory or visual hallucinations. No delusions.      Lab Review:     Component Value Date/Time   NA 140 08/04/2015 0739   K 4.0 08/04/2015 0739   CL 103 08/04/2015 0739   CO2 28 05/24/2014 0620   GLUCOSE 129 (H) 08/04/2015 0739   BUN 20 08/04/2015 0739   CREATININE 0.70 08/04/2015 0739   CALCIUM 9.1 05/24/2014 0620   GFRNONAA 87 (L) 05/24/2014 0620   GFRAA >90 05/24/2014 0620       Component Value Date/Time   WBC 7.1 05/24/2014 0620   RBC 5.08 05/24/2014 0620   HGB 13.3 08/04/2015 0739   HCT 39.0 08/04/2015 0739   PLT 284 05/24/2014 0620   MCV 79.5 05/24/2014 0620   MCH 26.2 05/24/2014 0620   MCHC 32.9 05/24/2014 0620   RDW 13.6 05/24/2014 0620    No results found for: POCLITH, LITHIUM   No results found for: PHENYTOIN, PHENOBARB, VALPROATE, CBMZ   .res Assessment: Plan:    Lacresha was seen today for anxiety.  Diagnoses and all orders for this visit:  Panic disorder with agoraphobia  PTSD (post-traumatic stress disorder)  Delayed sleep phase  syndrome  Insomnia due to mental condition   Overall doing well as she can.  Chronic avoidance and general fearfulness at baseline.  Sleep hygiene in detail.  Normalized some of the problem.  She's getting enough sleep.  Delayed sleep phase is improved from last visit.  Insomnia is better.    Rec exercise.  Went off SSRI AMA DT weight gain.  Has done ok off it but no weight loss.  Does not think she can tolerate anxiety without Xanax.  A lot of benefit and no SE.  We discussed the short-term risks associated with benzodiazepines including sedation and increased fall risk among others.  Discussed long-term side effect risk including dependence, potential withdrawal symptoms, and the potential eventual dose-related risk of dementia.  Asks to increase HS Xanax.  Already at significant dose for age.  Rec against this.  FU 6 mos.  Lynder Parents, MD, DFAPA   Please see After Visit Summary for patient specific instructions.  Future Appointments  Date Time Provider [place]  06/01/2019  8:20 AM Briscoe Deutscher, DO [facility]    No orders of the defined types were placed in this encounter.     -------------------------------

## 2019-05-28 NOTE — Progress Notes (Signed)
Virtual Visit via Video   Due to the COVID-19 pandemic, this visit was completed with telemedicine (audio/video) technology to reduce patient and provider exposure as well as to preserve personal protective equipment.   I connected with Teresa Rice by a video enabled telemedicine application and verified that I am speaking with the correct person using two identifiers. Location patient: Home Location provider: [facility], Office Persons participating in the virtual visit: JAKI STEPTOE, Briscoe Deutscher, DO Lonell Grandchild, CMA acting as scribe for Dr. Briscoe Deutscher.   I discussed the limitations of evaluation and management by telemedicine and the availability of in person appointments. The patient expressed understanding and agreed to proceed.  Care Team   Patient Care Team: Briscoe Deutscher, DO as PCP - General (Family Medicine)  Subjective:   HPI: Patient was told to hold crestor at last visit to see if was causing foot cramps.   For the last 4 months she started having swelling in both feet. Left is much more then right. Stops at ankles. She denies any injury she does have soreness in top of feet and joint of toes. She has never been tested for gout. She can not notice change in amount of swelling or tenderness from when she wakes up. When she tries to go on walks she does have increased pain/swelling and has to stop. No Shortness of breath, no injury, no changes in bowel or bladder.   Asks for refill of Tramadol to help control pain in hips, legs, back, feet. Has seen Neurology in the past. Was told that she had peripheral neuropathy and right foot drop. Went through PT and wore a brace for months. Now, left foot feels worse. No falls.   Had Wellness exam at home through insurance. Was told that she had decreased peripheral pulses. States that her legs "get tired."  Tolerating Trulicity. Needs 90 day to  Get cheaper through insurance. Hasn't been walking due to heat. Eats at  night - "my daughter calls me a bread monster." AM fasting range 136-156 but highest was 192 recently.   Review of Systems  Constitutional: Negative for chills and fever.  HENT: Negative for hearing loss and tinnitus.   Eyes: Negative for blurred vision and double vision.  Respiratory: Negative for cough and wheezing.   Cardiovascular: Negative for chest pain, palpitations and leg swelling.       B/l feet swelling   Gastrointestinal: Negative for nausea and vomiting.  Genitourinary: Negative for dysuria and urgency.  Skin: Negative for rash.  Neurological: Negative for dizziness and headaches.  Psychiatric/Behavioral: Negative for depression and suicidal ideas.    Patient Active Problem List   Diagnosis Date Noted  . Obesity, Class I, BMI 30-34.9 05/06/2019  . Seasonal allergies 05/06/2019  . Gastroesophageal reflux disease 05/06/2019  . Moderate persistent asthma without complication 63/84/6659  . Acquired hypothyroidism, on Levothyoxine 25 mcg daily 05/06/2019  . Chronic bilateral low back pain without sciatica 05/06/2019  . Diabetes mellitus ([place]) 05/06/2019  . Hyperlipidemia associated with type 2 diabetes mellitus ([place]) 05/06/2019  . Hypertension associated with diabetes ([place]) 05/06/2019  . PTSD (post-traumatic stress disorder) 11/10/2018  . Closed nondisplaced fracture of proximal phalanx of left little finger 09/13/2016  . Closed fracture of left distal radius 08/30/2016  . Bilateral carpal tunnel syndrome 10/11/2015  . Primary osteoarthritis of both hands 10/11/2015  . Trigger finger of both hands 10/11/2015    Social History   Tobacco Use  . Smoking status: Former Smoker  Years: 30.00    Types: Cigarettes    Quit date: 05/21/1994    Years since quitting: 25.0  . Smokeless tobacco: Never Used  Substance Use Topics  . Alcohol use: Yes    Comment: RARE   Current Outpatient Medications:  .  ALPRAZolam (XANAX) 1 MG tablet, 0.5 tablet tid and 1 tablet at hs, Disp:  75 tablet, Rfl: 5 .  aspirin EC 81 MG tablet, Take 81 mg by mouth daily., Disp: , Rfl:  .  Calcium-Vitamin D (CALTRATE 600 PLUS-VIT D PO), Take 1 tablet by mouth 2 (two) times daily., Disp: , Rfl:  .  cetirizine (ZYRTEC) 10 MG tablet, Take 10 mg by mouth every morning. , Disp: , Rfl:  .  Dulaglutide (TRULICITY) 2.37 SE/8.3TD SOPN, Inject 1.5 mg into the skin once a week. , Disp: , Rfl:  .  levothyroxine (SYNTHROID, LEVOTHROID) 25 MCG tablet, , Disp: , Rfl:  .  lisinopril (PRINIVIL,ZESTRIL) 20 MG tablet, Take 20 mg by mouth every morning., Disp: , Rfl:  .  magnesium oxide (MAG-OX) 400 MG tablet, Take 400 mg by mouth 2 (two) times daily., Disp: , Rfl:  .  Multiple Vitamin (MULTIVITAMIN WITH MINERALS) TABS tablet, Take 1 tablet by mouth daily., Disp: , Rfl:  .  olopatadine (PATANOL) 0.1 % ophthalmic solution, Place 1 drop into both eyes 2 (two) times daily., Disp: , Rfl:  .  Omega 3 1000 MG CAPS, Take 1,000 mg by mouth daily., Disp: , Rfl:  .  omeprazole (PRILOSEC) 20 MG capsule, Take 20 mg by mouth daily., Disp: , Rfl:  .  Polyethylene Glycol 3350 (MIRALAX PO), Take by mouth every evening., Disp: , Rfl:  .  rosuvastatin (CRESTOR) 10 MG tablet, Take 10 mg by mouth every evening., Disp: , Rfl:  .  sitaGLIPtin-metformin (JANUMET) 50-500 MG tablet, Take 1 tablet by mouth 2 (two) times daily with a meal., Disp: 60 tablet, Rfl: 1 .  traMADol (ULTRAM) 50 MG tablet, Take by mouth., Disp: , Rfl:   Allergies  Allergen Reactions  . Celebrex [Celecoxib] Anaphylaxis  . Glipizide Itching  . Penicillins Other (See Comments)    "Burn marks from inside out"   . Sulfa Antibiotics Itching   Objective:   VITALS: Per patient if applicable, see vitals. GENERAL: Alert, appears well and in no acute distress. HEENT: Atraumatic, conjunctiva clear, no obvious abnormalities on inspection of external nose and ears. NECK: Normal movements of the head and neck. CARDIOPULMONARY: No increased WOB. Speaking in clear  sentences. I:E ratio WNL.  MS: Moves all visible extremities without noticeable abnormality. PSYCH: Pleasant and cooperative, well-groomed. Speech normal rate and rhythm. Affect is appropriate. Insight and judgement are appropriate. Attention is focused, linear, and appropriate.  NEURO: CN grossly intact. Oriented as arrived to appointment on time with no prompting. Moves both UE equally.  SKIN: No obvious lesions, wounds, erythema, or cyanosis noted on face or hands.  Depression screen [facility] 2/9 05/28/2019  Decreased Interest 0  Down, Depressed, Hopeless 0  PHQ - 2 Score 0  Altered sleeping 0  Tired, decreased energy 0  Change in appetite 0  Feeling bad or failure about yourself  0  Trouble concentrating 0  Moving slowly or fidgety/restless 0  Suicidal thoughts 0  PHQ-9 Score 0   Assessment and Plan:   Teresa Rice was seen today for foot swelling.  Diagnoses and all orders for this visit:  Bilateral leg pain Comments: DDx includes neurogenic or vascular claudication, vit deficiency (B12), DM related.  Will get labs and go from there. Need records.  Orders: -     Iron, TIBC and Ferritin Panel -     Magnesium -     Uric acid -     CK  Acquired hypothyroidism, on Levothyoxine 25 mcg daily -     T4, free -     TSH  Hyperlipidemia associated with type 2 diabetes mellitus ([place])  Hypertension associated with diabetes ([place])  Chronic bilateral low back pain without sciatica -     traMADol (ULTRAM) 50 MG tablet; Take 1 tablet (50 mg total) by mouth every 8 (eight) hours as needed for severe pain (chronic pain).  Type 2 diabetes mellitus with other specified complication, without long-term current use of insulin (HCC) -     CBC with Differential/Platelet -     Comprehensive metabolic panel -     Hemoglobin A1c  Obesity, Class I, BMI 30-34.9  Bilateral lower extremity edema -     Brain natriuretic peptide  Other polyneuropathy -     Iron, TIBC and Ferritin Panel -     Vitamin  B12  Medication management -     Vitamin B12 -     Uric acid -     CK  Vitamin D deficiency -     VITAMIN D 25 Hydroxy (Vit-D Deficiency, Fractures)  Chronic hip pain, bilateral -     traMADol (ULTRAM) 50 MG tablet; Take 1 tablet (50 mg total) by mouth every 8 (eight) hours as needed for severe pain (chronic pain).   . COVID-19 Education: The signs and symptoms of COVID-19 were discussed with the patient and how to seek care for testing if needed. The importance of social distancing was discussed today. . Reviewed expectations re: course of current medical issues. . Discussed self-management of symptoms. . Outlined signs and symptoms indicating need for more acute intervention. . Patient verbalized understanding and all questions were answered. Marland Kitchen Health Maintenance issues including appropriate healthy diet, exercise, and smoking avoidance were discussed with patient. . See orders for this visit as documented in the electronic medical record.  Briscoe Deutscher, DO  Records requested if needed. Time spent: 25 minutes, of which >50% was spent in obtaining information about her symptoms, reviewing her previous labs, evaluations, and treatments, counseling her about her condition (please see the discussed topics above), and developing a plan to further investigate it; she had a number of questions which I addressed.

## 2019-05-30 ENCOUNTER — Encounter: Payer: Self-pay | Admitting: Family Medicine

## 2019-06-01 ENCOUNTER — Other Ambulatory Visit: Payer: Self-pay

## 2019-06-01 ENCOUNTER — Encounter: Payer: Self-pay | Admitting: Family Medicine

## 2019-06-01 ENCOUNTER — Ambulatory Visit (INDEPENDENT_AMBULATORY_CARE_PROVIDER_SITE_OTHER): Payer: Medicare Other | Admitting: Family Medicine

## 2019-06-01 ENCOUNTER — Other Ambulatory Visit: Payer: Medicare Other

## 2019-06-01 VITALS — Ht 64.0 in | Wt 176.0 lb

## 2019-06-01 DIAGNOSIS — E785 Hyperlipidemia, unspecified: Secondary | ICD-10-CM

## 2019-06-01 DIAGNOSIS — E559 Vitamin D deficiency, unspecified: Secondary | ICD-10-CM

## 2019-06-01 DIAGNOSIS — M79604 Pain in right leg: Secondary | ICD-10-CM

## 2019-06-01 DIAGNOSIS — R600 Localized edema: Secondary | ICD-10-CM

## 2019-06-01 DIAGNOSIS — E039 Hypothyroidism, unspecified: Secondary | ICD-10-CM | POA: Diagnosis not present

## 2019-06-01 DIAGNOSIS — E1169 Type 2 diabetes mellitus with other specified complication: Secondary | ICD-10-CM

## 2019-06-01 DIAGNOSIS — E66811 Obesity, class 1: Secondary | ICD-10-CM

## 2019-06-01 DIAGNOSIS — G6289 Other specified polyneuropathies: Secondary | ICD-10-CM

## 2019-06-01 DIAGNOSIS — G8929 Other chronic pain: Secondary | ICD-10-CM

## 2019-06-01 DIAGNOSIS — I10 Essential (primary) hypertension: Secondary | ICD-10-CM

## 2019-06-01 DIAGNOSIS — M25551 Pain in right hip: Secondary | ICD-10-CM

## 2019-06-01 DIAGNOSIS — Z79899 Other long term (current) drug therapy: Secondary | ICD-10-CM

## 2019-06-01 DIAGNOSIS — E1159 Type 2 diabetes mellitus with other circulatory complications: Secondary | ICD-10-CM

## 2019-06-01 DIAGNOSIS — E669 Obesity, unspecified: Secondary | ICD-10-CM

## 2019-06-01 DIAGNOSIS — M545 Low back pain, unspecified: Secondary | ICD-10-CM

## 2019-06-01 DIAGNOSIS — M79605 Pain in left leg: Secondary | ICD-10-CM | POA: Diagnosis not present

## 2019-06-01 DIAGNOSIS — I152 Hypertension secondary to endocrine disorders: Secondary | ICD-10-CM

## 2019-06-01 DIAGNOSIS — M25552 Pain in left hip: Secondary | ICD-10-CM

## 2019-06-01 LAB — BRAIN NATRIURETIC PEPTIDE: Pro B Natriuretic peptide (BNP): 33 pg/mL (ref 0.0–100.0)

## 2019-06-01 LAB — CBC WITH DIFFERENTIAL/PLATELET
Basophils Absolute: 0.1 10*3/uL (ref 0.0–0.1)
Basophils Relative: 1.1 % (ref 0.0–3.0)
Eosinophils Absolute: 0.3 10*3/uL (ref 0.0–0.7)
Eosinophils Relative: 3.7 % (ref 0.0–5.0)
HCT: 39.7 % (ref 36.0–46.0)
Hemoglobin: 13 g/dL (ref 12.0–15.0)
Lymphocytes Relative: 46.6 % — ABNORMAL HIGH (ref 12.0–46.0)
Lymphs Abs: 3.3 10*3/uL (ref 0.7–4.0)
MCHC: 32.7 g/dL (ref 30.0–36.0)
MCV: 81.2 fl (ref 78.0–100.0)
Monocytes Absolute: 0.5 10*3/uL (ref 0.1–1.0)
Monocytes Relative: 7 % (ref 3.0–12.0)
Neutro Abs: 2.9 10*3/uL (ref 1.4–7.7)
Neutrophils Relative %: 41.6 % — ABNORMAL LOW (ref 43.0–77.0)
Platelets: 333 10*3/uL (ref 150.0–400.0)
RBC: 4.89 Mil/uL (ref 3.87–5.11)
RDW: 14.3 % (ref 11.5–15.5)
WBC: 7.1 10*3/uL (ref 4.0–10.5)

## 2019-06-01 LAB — COMPREHENSIVE METABOLIC PANEL
ALT: 28 U/L (ref 0–35)
AST: 18 U/L (ref 0–37)
Albumin: 4.3 g/dL (ref 3.5–5.2)
Alkaline Phosphatase: 44 U/L (ref 39–117)
BUN: 21 mg/dL (ref 6–23)
CO2: 27 mEq/L (ref 19–32)
Calcium: 9.5 mg/dL (ref 8.4–10.5)
Chloride: 102 mEq/L (ref 96–112)
Creatinine, Ser: 0.82 mg/dL (ref 0.40–1.20)
GFR: 68.2 mL/min (ref >60.00)
Glucose, Bld: 141 mg/dL — ABNORMAL HIGH (ref 70–99)
Potassium: 4.3 mEq/L (ref 3.5–5.1)
Sodium: 137 mEq/L (ref 135–145)
Total Bilirubin: 0.3 mg/dL (ref 0.2–1.2)
Total Protein: 7 g/dL (ref 6.0–8.3)

## 2019-06-01 LAB — T4, FREE: Free T4: 0.86 ng/dL (ref 0.60–1.60)

## 2019-06-01 LAB — MAGNESIUM: Magnesium: 1.6 mg/dL (ref 1.5–2.5)

## 2019-06-01 LAB — VITAMIN D 25 HYDROXY (VIT D DEFICIENCY, FRACTURES): VITD: 40.95 ng/mL (ref 30.00–100.00)

## 2019-06-01 LAB — HEMOGLOBIN A1C: Hgb A1c MFr Bld: 7.8 % — ABNORMAL HIGH (ref 4.6–6.5)

## 2019-06-01 LAB — CK: Total CK: 35 U/L (ref 7–177)

## 2019-06-01 LAB — URIC ACID: Uric Acid, Serum: 4.3 mg/dL (ref 2.4–7.0)

## 2019-06-01 LAB — TSH: TSH: 5.11 u[IU]/mL — ABNORMAL HIGH (ref 0.35–4.50)

## 2019-06-01 LAB — VITAMIN B12: Vitamin B-12: 606 pg/mL (ref 211–911)

## 2019-06-01 MED ORDER — TRAMADOL HCL 50 MG PO TABS
50.0000 mg | ORAL_TABLET | Freq: Three times a day (TID) | ORAL | 0 refills | Status: AC | PRN
Start: 2019-06-01 — End: 2019-07-01

## 2019-06-02 ENCOUNTER — Telehealth: Payer: Self-pay | Admitting: Family Medicine

## 2019-06-02 DIAGNOSIS — M79604 Pain in right leg: Secondary | ICD-10-CM

## 2019-06-02 DIAGNOSIS — G609 Hereditary and idiopathic neuropathy, unspecified: Secondary | ICD-10-CM

## 2019-06-02 LAB — IRON,TIBC AND FERRITIN PANEL
%SAT: 14 % (calc) — ABNORMAL LOW (ref 16–45)
Ferritin: 39 ng/mL (ref 16–288)
Iron: 50 ug/dL (ref 45–160)
TIBC: 364 mcg/dL (calc) (ref 250–450)

## 2019-06-02 NOTE — Telephone Encounter (Signed)
Patient has decided she would like to have the referral to neurology, thank you!

## 2019-06-02 NOTE — Telephone Encounter (Signed)
Left message to return call to our office.  

## 2019-06-02 NOTE — Telephone Encounter (Signed)
Did you have one you wanted sent to?

## 2019-06-02 NOTE — Telephone Encounter (Signed)
No preference from me, she saw someone in [facility] a few years ago.

## 2019-06-03 ENCOUNTER — Ambulatory Visit: Payer: Medicare Other | Admitting: Family Medicine

## 2019-06-05 NOTE — Telephone Encounter (Signed)
I spoke with patient and she informed me that she does not have a preference for neurologist. Did you want to sent a referral out to neurology and if so I will need DX?  Thank you.

## 2019-06-05 NOTE — Telephone Encounter (Signed)
Please place Neurology referral. Dx leg pain, peripheral neuropathy.

## 2019-06-06 ENCOUNTER — Encounter: Payer: Self-pay | Admitting: Family Medicine

## 2019-06-08 ENCOUNTER — Other Ambulatory Visit: Payer: Self-pay | Admitting: *Deleted

## 2019-06-08 MED ORDER — TRULICITY 0.75 MG/0.5ML ~~LOC~~ SOAJ: 1.5000 mg | SUBCUTANEOUS | 0 refills | Status: DC

## 2019-06-08 MED ORDER — GABAPENTIN 100 MG PO CAPS: 100.0000 mg | ORAL_CAPSULE | Freq: Three times a day (TID) | ORAL | 1 refills | Status: DC

## 2019-06-08 NOTE — Telephone Encounter (Signed)
Teresa Rice, I sent in Rx for Trulicity 3 month supply, but can you check on the paper she is talking about. Thanks

## 2019-06-08 NOTE — Addendum Note (Signed)
Addended by: Marian Sorrow on: 06/08/2019 10:12 AM   Modules accepted: Orders

## 2019-06-08 NOTE — Telephone Encounter (Signed)
Spoke to pt told her referral for Neurology has been placed someone will be contacting you to schedule an appt. If you do not hear from someone within 3-4 weeks please let us know. Pt verbalized understanding.

## 2019-06-08 NOTE — Progress Notes (Signed)
gabap

## 2019-06-09 ENCOUNTER — Other Ambulatory Visit: Payer: Self-pay

## 2019-06-09 ENCOUNTER — Encounter: Payer: Self-pay | Admitting: Family Medicine

## 2019-06-09 MED ORDER — GLUCOSE BLOOD VI STRP: ORAL_STRIP | 12 refills | Status: DC

## 2019-06-11 ENCOUNTER — Other Ambulatory Visit: Payer: Self-pay

## 2019-06-11 DIAGNOSIS — M79604 Pain in right leg: Secondary | ICD-10-CM

## 2019-06-11 DIAGNOSIS — M79605 Pain in left leg: Secondary | ICD-10-CM

## 2019-06-21 ENCOUNTER — Encounter: Payer: Self-pay | Admitting: Family Medicine

## 2019-06-22 ENCOUNTER — Telehealth: Payer: Self-pay

## 2019-06-22 ENCOUNTER — Other Ambulatory Visit: Payer: Self-pay

## 2019-06-22 MED ORDER — AMOXICILLIN-POT CLAVULANATE 875-125 MG PO TABS: 1.0000 | ORAL_TABLET | Freq: Two times a day (BID) | ORAL | 0 refills | Status: DC

## 2019-06-22 NOTE — Telephone Encounter (Signed)
1. Okay to call in Augmentin 875 mg po BID x 10 days. 2. Okay to restart Crestor if leg pain did not improve. CK was normal.

## 2019-06-22 NOTE — Telephone Encounter (Signed)
Hold and will discuss at next visit.

## 2019-06-22 NOTE — Telephone Encounter (Signed)
Copied from [place] [phone] [phone]. Topic: General - Other >> Jun 22, 2019  9:52 AM Leward Quan A wrote: Reason for CRM: Patient called to inform Dr Santina Evans that she have a cough with congestion and lots of wheezing. Asking for an Rx to be sent to be sent to the pharmacy patient can be reached at Ph# [phone]

## 2019-06-22 NOTE — Telephone Encounter (Signed)
Called patient let her know that abx sent in.  She has had improvement with leg cramps after stopping the medication. Did you want her to change to something different?

## 2019-06-22 NOTE — Telephone Encounter (Signed)
Called patient she has been having symptoms started before 6/9 app with our office. She has productive cough with green mucus. She has had nasal congestion with increased wheezing at night. He denies any fever, body aches, changes in taste or smell or changes in apatite.  Would like to have something called in to make her feel better.   She did d/c the Crestor over 30 days ago and wanted to know if you wanted her to start back on in yet?

## 2019-06-23 NOTE — Telephone Encounter (Signed)
Patient informed will call if any questions.  

## 2019-06-26 ENCOUNTER — Telehealth (INDEPENDENT_AMBULATORY_CARE_PROVIDER_SITE_OTHER): Payer: Medicare Other | Admitting: Neurology

## 2019-06-26 ENCOUNTER — Encounter: Payer: Self-pay | Admitting: Neurology

## 2019-06-26 ENCOUNTER — Other Ambulatory Visit: Payer: Self-pay

## 2019-06-26 VITALS — Ht 64.0 in | Wt 176.0 lb

## 2019-06-26 DIAGNOSIS — E1142 Type 2 diabetes mellitus with diabetic polyneuropathy: Secondary | ICD-10-CM

## 2019-06-26 DIAGNOSIS — I739 Peripheral vascular disease, unspecified: Secondary | ICD-10-CM | POA: Diagnosis not present

## 2019-06-26 NOTE — Progress Notes (Signed)
New Patient Virtual Visit via Video Note The purpose of this virtual visit is to provide medical care while limiting exposure to the novel coronavirus.    Consent was obtained for video visit:  Yes.   Answered questions that patient had about telehealth interaction:  Yes.   I discussed the limitations, risks, security and privacy concerns of performing an evaluation and management service by telemedicine. I also discussed with the patient that there may be a patient responsible charge related to this service. The patient expressed understanding and agreed to proceed.  Pt location: Home Physician Location: office Name of referring provider:  Briscoe Deutscher, DO I connected with Bynum Bellows at patients initiation/request on 06/26/2019 at 12:50 PM EDT by video enabled telemedicine application and verified that I am speaking with the correct person using two identifiers. Pt MRN:  [id number] Pt DOB:  1946/02/14 Video Participants:  Bynum Bellows    History of Present Illness: Teresa Rice is a 73 y.o. right-handed female with diabetes mellitus, hypothyroidism, hyperlipidemia, hypertension, and bladder cancer (2012) presenting for evaluation of bilateral leg pain.   She complains of achy pain and soreness in the calf which is worse with exertion.  She can walk for several minutes then then has to stop because of achy pain in the lower legs.  Rest quickly improves symptoms and she is able to resume activity, until pain returns.  She does not have exertional low back pain.  She denies leg weakness.  She has known diabetic neuropathy in the feet with numbness and tingling.  She was diagnosed by a neurologist in [facility] where she initially presented with a right foot drop.  I do not have these reports to review.  She did physical therapy and wore a brace, which helped.  She no longer has foot weakness.  She denies imbalance and walks unassisted.   Out-side paper records, electronic  medical record, and images have been reviewed where available and summarized as:  Lab Results  Component Value Date   HGBA1C 7.8 (H) 06/01/2019   Lab Results  Component Value Date   [id number] 035 06/01/2019   Lab Results  Component Value Date   TSH 5.11 (H) 06/01/2019   No results found for: ESRSEDRATE, POCTSEDRATE  Past Medical History:  Diagnosis Date  . Bladder cancer [facility]) first dx 2012   Recurrent Bladder Cancer--  (urologist-  dr Diona Fanti)  . Chronic constipation   . Full dentures   . GERD (gastroesophageal reflux disease)   . Hyperlipidemia   . Hypertension   . Mild intermittent asthma   . OA (osteoarthritis)    fingers   . Trigger finger of both hands    wear slints and special gloves at night  . Type 2 diabetes mellitus ([place])     Past Surgical History:  Procedure Laterality Date  . BLADDER SURGERY  2012   in [place]   TURBT  . CHOLECYSTECTOMY  1970  . CYSTOSCOPY/RETROGRADE/URETEROSCOPY Left 05/24/2014   Procedure: CYSTOSCOPY/RETROGRADE/ URETEROSCOPY;  Surgeon: Jorja Loa, MD;  Location: [facility];  Service: Urology;  Laterality: Left;  . TRANSURETHRAL RESECTION OF BLADDER TUMOR N/A 05/24/2014   Procedure: TRANSURETHRAL RESECTION OF BLADDER TUMOR (TURBT) ;  Surgeon: Jorja Loa, MD;  Location: [facility];  Service: Urology;  Laterality: N/A;  . TRANSURETHRAL RESECTION OF BLADDER TUMOR N/A 08/04/2015   Procedure: TRANSURETHRAL RESECTION OF BLADDER TUMOR (TURBT);  Surgeon: Franchot Gallo, MD;  Location: [facility];  Service:  Urology;  Laterality: N/A;     Medications:  Outpatient Encounter Medications as of 06/26/2019  Medication Sig  . ALPRAZolam (XANAX) 1 MG tablet 0.5 tablet tid and 1 tablet at hs  . amoxicillin-clavulanate (AUGMENTIN) 875-125 MG tablet Take 1 tablet by mouth 2 (two) times daily.  Marland Kitchen aspirin EC 81 MG tablet Take 81 mg by mouth daily.  . budesonide-formoterol (SYMBICORT) 160-4.5 MCG/ACT inhaler Inhale 2 puffs  into the lungs 2 (two) times daily.  . Calcium-Vitamin D (CALTRATE 600 PLUS-VIT D PO) Take 1 tablet by mouth 2 (two) times daily.  . cetirizine (ZYRTEC) 10 MG tablet Take 10 mg by mouth every morning.   . diclofenac sodium (VOLTAREN) 1 % GEL   . Dulaglutide (TRULICITY) 6.71 IW/5.8KD SOPN Inject 1.5 mg into the skin once a week.  . fenofibrate 160 MG tablet   . fluticasone (FLONASE) 50 MCG/ACT nasal spray   . gabapentin (NEURONTIN) 100 MG capsule Take 1 capsule (100 mg total) by mouth 3 (three) times daily.  Marland Kitchen glucose blood test strip Use as instructed  . levothyroxine (SYNTHROID, LEVOTHROID) 25 MCG tablet   . lisinopril (PRINIVIL,ZESTRIL) 20 MG tablet Take 20 mg by mouth every morning.  Marland Kitchen lisinopril (ZESTRIL) 40 MG tablet   . magnesium oxide (MAG-OX) 400 MG tablet Take 400 mg by mouth 2 (two) times daily.  . montelukast (SINGULAIR) 10 MG tablet   . Multiple Vitamin (MULTIVITAMIN WITH MINERALS) TABS tablet Take 1 tablet by mouth daily.  Marland Kitchen olopatadine (PATANOL) 0.1 % ophthalmic solution Place 1 drop into both eyes 2 (two) times daily.  . Omega 3 1000 MG CAPS Take 1,000 mg by mouth daily.  Marland Kitchen omeprazole (PRILOSEC) 20 MG capsule Take 20 mg by mouth daily.  . Polyethylene Glycol 3350 (MIRALAX PO) Take by mouth every evening.  . sitaGLIPtin-metformin (JANUMET) 50-500 MG tablet Take 1 tablet by mouth 2 (two) times daily with a meal.  . SYMBICORT 80-4.5 MCG/ACT inhaler   . traMADol (ULTRAM) 50 MG tablet Take 1 tablet (50 mg total) by mouth every 8 (eight) hours as needed for severe pain (chronic pain).   No facility-administered encounter medications on file as of 06/26/2019.     Allergies:  Allergies  Allergen Reactions  . Celebrex [Celecoxib] Anaphylaxis  . Glipizide Itching  . Penicillins Other (See Comments)    "Burn marks from inside out"   . Sulfa Antibiotics Itching    Family History: Family History  Problem Relation Age of Onset  . Early death Mother   . AAA (abdominal aortic  aneurysm) Sister   . Cancer Father   . Diabetes Brother   . Cancer Brother     Social History: Social History   Tobacco Use  . Smoking status: Former Smoker    Years: 30.00    Types: Cigarettes    Quit date: 05/21/1994    Years since quitting: 25.1  . Smokeless tobacco: Never Used  Substance Use Topics  . Alcohol use: Yes    Comment: RARE  . Drug use: No   Social History   Social History Narrative   Lives in an apartment, lives alone   One daughter   Right handed    Retired from [employer] work   Highest level of education:  GED    Review of Systems:  CONSTITUTIONAL: No fevers, chills, night sweats, or weight loss.   EYES: No visual changes or eye pain ENT: No hearing changes.  No history of nose bleeds.   RESPIRATORY: No cough,  wheezing and shortness of breath.   CARDIOVASCULAR: Negative for chest pain, and palpitations.   GI: Negative for abdominal discomfort, blood in stools or black stools.  No recent change in bowel habits.   GU:  No history of incontinence.   MUSCLOSKELETAL: No history of joint pain or swelling.  No myalgias.   SKIN: Negative for lesions, rash, and itching.   HEMATOLOGY/ONCOLOGY: Negative for prolonged bleeding, bruising easily, and swollen nodes.  No history of cancer.   ENDOCRINE: Negative for cold or heat intolerance, polydipsia or goiter.   PSYCH:  No depression or anxiety symptoms.   NEURO: As Above.   Vital Signs:  Ht 5\' 4"  (1.626 m)   Wt 176 lb (79.8 kg)   BMI 30.21 kg/m    General Medical Exam:  Well appearing, comfortable.  Nonlabored breathing.  No deformity or edema.  No rash.  Neurological Exam: MENTAL STATUS including orientation to time, place, person, recent and remote memory, attention span and concentration, language, and fund of knowledge is normal.  Speech is not dysarthric.  CRANIAL NERVES:  Normal conjugate, extra-ocular eye movements in all directions of gaze.  No ptosis.  Normal facial symmetry and movements.  Normal  shoulder shrug and head rotation.  Tongue is midline.  MOTOR:  Antigravity in all extremities.  No abnormal movements.  No pronator drift.   SENSORY & REFLEXES:  Unable to assess   COORDINATION/GAIT:  Intact rapid alternating movements bilaterally.  Able to rise from a chair without using arms.  Gait narrow based and stable.    IMPRESSION: 1. Exertional leg pain concerning for claudication.  She denies low back pain with exertion, making neurogenic claudication less likely, however due to being virtual visit her examination is limited. Recommend arterial studies of the legs  2.  Diabetic peripheral neuropathy, current symptomology does not suggest neuropathy to be a cause of her leg pain as neuropathy is not triggered by activity, not characterized by achy pain/soreness.  She will return to the office for formal neuromuscular examination and NCS/EMG of the legs to determine the severity of neuropathy and assess for lumbosacral radiculopathy.   Follow Up Instructions:  I discussed the assessment and treatment plan with the patient. The patient was provided an opportunity to ask questions and all were answered. The patient agreed with the plan and demonstrated an understanding of the instructions.   The patient was advised to call back or seek an in-person evaluation if the symptoms worsen or if the condition fails to improve as anticipated.  Further recommendations pending results.   Alda Berthold, DO

## 2019-06-29 NOTE — Telephone Encounter (Signed)
sCopied from [place] [phone]. Topic: Referral - Question >> Jun 29, 2019 10:41 AM Ivar Drape wrote: Reason for CRM:  Shaunda w/[facility] [phone] needs to know if the patient needs just a consult or if the provider would like to order a test.  Please advise ASAP.

## 2019-06-29 NOTE — Telephone Encounter (Signed)
Called office to let them know she needs both.

## 2019-07-02 ENCOUNTER — Other Ambulatory Visit: Payer: Self-pay | Admitting: Family Medicine

## 2019-07-02 DIAGNOSIS — E1169 Type 2 diabetes mellitus with other specified complication: Secondary | ICD-10-CM

## 2019-07-09 ENCOUNTER — Ambulatory Visit (INDEPENDENT_AMBULATORY_CARE_PROVIDER_SITE_OTHER): Payer: Medicare Other

## 2019-07-09 ENCOUNTER — Other Ambulatory Visit: Payer: Self-pay

## 2019-07-09 DIAGNOSIS — Z23 Encounter for immunization: Secondary | ICD-10-CM | POA: Diagnosis not present

## 2019-07-09 NOTE — Progress Notes (Signed)
I have reviewed the patient's encounter and agree with the documentation.  Teresa Rice. Jerline Pain, MD 07/09/2019 4:08 PM

## 2019-07-09 NOTE — Progress Notes (Signed)
Per orders of Dr. Jerline Pain, injection of Prevnar 13 given left deltoid IM by Kevan Ny, CMA Patient tolerated injection well.  Patient was originally scheduled to have pneumonia vaccine and Tdap.  I explained to her that since she has Medicare, her insurance will not pay for her to have a Tdap.  Advised her to contact her pharmacy to obtain the vaccination there for her Tdap.  She voiced understanding.

## 2019-07-30 ENCOUNTER — Ambulatory Visit (INDEPENDENT_AMBULATORY_CARE_PROVIDER_SITE_OTHER): Payer: Medicare Other | Admitting: Neurology

## 2019-07-30 ENCOUNTER — Other Ambulatory Visit: Payer: Self-pay

## 2019-07-30 ENCOUNTER — Encounter: Payer: Self-pay | Admitting: Neurology

## 2019-07-30 VITALS — BP 118/86 | HR 94 | Ht 64.0 in | Wt 176.0 lb

## 2019-07-30 DIAGNOSIS — M5417 Radiculopathy, lumbosacral region: Secondary | ICD-10-CM

## 2019-07-30 DIAGNOSIS — M48062 Spinal stenosis, lumbar region with neurogenic claudication: Secondary | ICD-10-CM | POA: Diagnosis not present

## 2019-07-30 DIAGNOSIS — E1142 Type 2 diabetes mellitus with diabetic polyneuropathy: Secondary | ICD-10-CM | POA: Diagnosis not present

## 2019-07-30 NOTE — Procedures (Signed)
[facility]  [place], [place]  [place], [place] [address] Tel: [phone] Fax:  [phone] Test Date:  07/30/2019  Patient: Teresa Rice DOB: 1946/01/12 Physician: Narda Amber, DO  Sex: Female Height: 5\' 4"  Ref Phys: Narda Amber, DO  ID#: [id number] Temp: 33.0C Technician:    Patient Complaints: This is a 73 year old female referred for evaluation of bilateral exertional leg pain.  NCV & EMG Findings: Extensive electrodiagnostic testing of the right lower extremity and additional studies of the left shows:  1. Bilateral sural and superficial peroneal sensory responses are within normal limits. 2. Right peroneal and bilateral tibial motor responses are within normal limits.  Left peroneal motor response at the extensor digitorum brevis is reduced, and normal at the tibialis anterior. 3. Bilateral tibial H reflex studies are within normal limits. 4. Chronic motor axonal loss changes are seen affecting bilateral L5 myotomes, without accompanied active denervation.  Impression: 1. Chronic L5 radiculopathy affecting bilateral lower extremities, mild in degree electrically. 2. There is no evidence of a large fiber sensorimotor polyneuropathy affecting the lower extremities.   ___________________________ Narda Amber, DO    Nerve Conduction Studies Anti Sensory Summary Table   Site NR Peak (ms) Norm Peak (ms) P-T Amp (V) Norm P-T Amp  Left Sup Peroneal Anti Sensory (Ant Lat Mall)  33C  12 cm    2.5 <4.6 5.3 >3  Right Sup Peroneal Anti Sensory (Ant Lat Mall)  33C  12 cm    2.7 <4.6 7.0 >3  Left Sural Anti Sensory (Lat Mall)  33C  Calf    2.4 <4.6 8.6 >3  Right Sural Anti Sensory (Lat Mall)  33C  Calf    2.8 <4.6 9.9 >3   Motor Summary Table   Site NR Onset (ms) Norm Onset (ms) O-P Amp (mV) Norm O-P Amp Site1 Site2 Delta-0 (ms) Dist (cm) Vel (m/s) Norm Vel (m/s)  Left Peroneal Motor (Ext Dig Brev)  33C  Ankle    3.8 <6.0 1.6 >2.5 B Fib Ankle  6.2 30.0 48 >40  B Fib    10.0  1.4  Poplt B Fib 1.7 8.0 47 >40  Poplt    11.7  1.4         Right Peroneal Motor (Ext Dig Brev)  33C  Ankle    3.2 <6.0 2.5 >2.5 B Fib Ankle 6.7 34.0 51 >40  B Fib    9.9  2.0  Poplt B Fib 1.6 8.0 50 >40  Poplt    11.5  1.9         Left Peroneal TA Motor (Tib Ant)  33C  Fib Head    2.6 <4.5 6.8 >3 Poplit Fib Head 1.3 8.0 62 >40  Poplit    3.9  6.2         Left Tibial Motor (Abd Hall Brev)  33C  Ankle    3.9 <6.0 8.0 >4 Knee Ankle 7.3 36.0 49 >40  Knee    11.2  5.6         Right Tibial Motor (Abd Hall Brev)  33C  Ankle    3.4 <6.0 10.0 >4 Knee Ankle 7.0 39.0 56 >40  Knee    10.4  5.1          H Reflex Studies   NR H-Lat (ms) Lat Norm (ms) L-R H-Lat (ms)  Left Tibial (Gastroc)  33C     33.06 <35 0.00  Right Tibial (Gastroc)  33C  33.06 <35 0.00   EMG   Side Muscle Ins Act Fibs Psw Fasc Number Recrt Dur Dur. Amp Amp. Poly Poly. Comment  Right AntTibialis Nml Nml Nml Nml 1- Rapid Some 1+ Some 1+ Nml Nml N/A  Right Gastroc Nml Nml Nml Nml Nml Nml Nml Nml Nml Nml Nml Nml N/A  Right Flex Dig Long Nml Nml Nml Nml 1- Rapid Some 1+ Some 1+ Nml Nml N/A  Right RectFemoris Nml Nml Nml Nml Nml Nml Nml Nml Nml Nml Nml Nml N/A  Right GluteusMed Nml Nml Nml Nml 1- Rapid Some 1+ Some 1+ Nml Nml N/A  Left AntTibialis Nml Nml Nml Nml 1- Rapid Some 1+ Some 1+ Nml Nml N/A  Left Gastroc Nml Nml Nml Nml Nml Nml Nml Nml Nml Nml Nml Nml N/A  Left Flex Dig Long Nml Nml Nml Nml 1- Rapid Some 1+ Some 1+ Nml Nml N/A  Left RectFemoris Nml Nml Nml Nml Nml Nml Nml Nml Nml Nml Nml Nml N/A  Left GluteusMed Nml Nml Nml Nml 1- Rapid Some 1+ Some 1+ Nml Nml N/A      Waveforms:

## 2019-07-30 NOTE — Progress Notes (Signed)
Follow-up Visit   Date: 07/30/19   Teresa Rice MRN: [id number] DOB: Jan 22, 1946   Interim History: Teresa Rice is a 73 y.o. right-handed Caucasian female with diabetes mellitus, hypothyroidism, hyperlipidemia, hypertension, and bladder cancer (2012) returning to the clinic for follow-up of bilateral leg pain.  The patient was accompanied to the clinic by self.  History of present illness: She complains of achy pain and soreness in the calf which is worse with exertion.  She can walk for several minutes then then has to stop because of achy pain in the lower legs.  Rest quickly improves symptoms and she is able to resume activity, until pain returns.  She does not have exertional low back pain.  She denies leg weakness.  She has known diabetic neuropathy in the feet with numbness and tingling.  She was diagnosed by a neurologist in [facility] where she initially presented with a right foot drop.  I do not have these reports to review.  She did physical therapy and wore a brace, which helped.  She no longer has foot weakness.  She denies imbalance and walks unassisted.  UPDATE 07/30/2019: She is here for follow-up visit.  She continues to have achy leg pain, worse with walking.  She does report being able to walk up to a mile before she has to take a brief break.  She has some numbness and tingling of the feet, this has been long-lasting.  She has not had any falls.  She denies any muscle cramps, low back pain, or leg weakness.  No bowel/bladder incontinence. She babysits her great grandchild 5 days a week and stays active with him.  Medications:  Current Outpatient Medications on File Prior to Visit  Medication Sig Dispense Refill  . ALPRAZolam (XANAX) 1 MG tablet 0.5 tablet tid and 1 tablet at hs 75 tablet 5  . aspirin EC 81 MG tablet Take 81 mg by mouth daily.    . budesonide-formoterol (SYMBICORT) 160-4.5 MCG/ACT inhaler Inhale 2 puffs into the lungs 2 (two) times daily.    .  Calcium-Vitamin D (CALTRATE 600 PLUS-VIT D PO) Take 1 tablet by mouth 2 (two) times daily.    . cetirizine (ZYRTEC) 10 MG tablet Take 10 mg by mouth every morning.     . diclofenac sodium (VOLTAREN) 1 % GEL     . Dulaglutide (TRULICITY) A999333 0000000 SOPN Inject 1.5 mg into the skin once a week. 12 pen 0  . fenofibrate 160 MG tablet     . fluticasone (FLONASE) 50 MCG/ACT nasal spray     . gabapentin (NEURONTIN) 100 MG capsule Take 1 capsule (100 mg total) by mouth 3 (three) times daily. 90 capsule 1  . glucose blood test strip Use as instructed 100 each 12  . JANUMET 50-500 MG tablet TAKE 1 TABLET BY MOUTH TWICE DAILY WITH A MEAL 60 tablet 1  . JANUVIA 100 MG tablet     . levothyroxine (SYNTHROID, LEVOTHROID) 25 MCG tablet     . lisinopril (PRINIVIL,ZESTRIL) 20 MG tablet Take 20 mg by mouth every morning.    Marland Kitchen lisinopril (ZESTRIL) 40 MG tablet     . magnesium oxide (MAG-OX) 400 MG tablet Take 400 mg by mouth 2 (two) times daily.    . montelukast (SINGULAIR) 10 MG tablet     . Multiple Vitamin (MULTIVITAMIN WITH MINERALS) TABS tablet Take 1 tablet by mouth daily.    Marland Kitchen olopatadine (PATANOL) 0.1 % ophthalmic solution Place 1 drop into  both eyes 2 (two) times daily.    . Omega 3 1000 MG CAPS Take 1,000 mg by mouth daily.    Marland Kitchen omeprazole (PRILOSEC) 20 MG capsule Take 20 mg by mouth daily.    . Polyethylene Glycol 3350 (MIRALAX PO) Take by mouth every evening.    . SYMBICORT 80-4.5 MCG/ACT inhaler     . TRULICITY 1.5 0000000 SOPN     . amoxicillin-clavulanate (AUGMENTIN) 875-125 MG tablet Take 1 tablet by mouth 2 (two) times daily. 20 tablet 0   No current facility-administered medications on file prior to visit.     Allergies:  Allergies  Allergen Reactions  . Celebrex [Celecoxib] Anaphylaxis  . Glipizide Itching  . Penicillins Other (See Comments)    "Burn marks from inside out"   . Sulfa Antibiotics Itching    Review of Systems:  CONSTITUTIONAL: No fevers, chills, night sweats,  or weight loss.  EYES: No visual changes or eye pain ENT: No hearing changes.  No history of nose bleeds.   RESPIRATORY: No cough, wheezing and shortness of breath.   CARDIOVASCULAR: Negative for chest pain, and palpitations.   GI: Negative for abdominal discomfort, blood in stools or black stools.  No recent change in bowel habits.   GU:  No history of incontinence.   MUSCLOSKELETAL: No history of joint pain or swelling.  No myalgias.   SKIN: Negative for lesions, rash, and itching.   ENDOCRINE: Negative for cold or heat intolerance, polydipsia or goiter.   PSYCH:  No depression or anxiety symptoms.   NEURO: As Above.   Vital Signs:  BP 118/86   Pulse 94   Ht 5\' 4"  (1.626 m)   Wt 176 lb (79.8 kg)   SpO2 95%   BMI 30.21 kg/m    General Medical Exam:   General:  Well appearing, comfortable  Ext:  Feet are warm with easily palpable pedal pulse  Neurological Exam: MENTAL STATUS including orientation to time, place, person, recent and remote memory, attention span and concentration, language, and fund of knowledge is normal.  Speech is not dysarthric.  CRANIAL NERVES:   Face is symmetric.   MOTOR:  Motor strength is 5/5 in all extremities, including bilateral dorsiflexion, plantarflexion, toe extension, and toe flexion.  No atrophy, fasciculations or abnormal movements.  No pronator drift.  Tone is normal.    MSRs:                                           Right        Left brachioradialis 2+  2+  biceps 2+  2+  triceps 2+  2+  patellar 3+  3+  ankle jerk 1+  1+  plantar response down  down  Bilateral crossed adductors.    SENSORY:  Intact to vibration, temperature, and pin prick throughout.  COORDINATION/GAIT:    Gait mildly wide-based and stable, unassisted.   Data: NCS/EMG of the legs 07/30/2019:  Chronic L5 radiculopathy affecting bilateral lower extremities.  No evidence of large fiber sensorimotor polyneuropathy.   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  It's likely that her paresthesias in the feet are stemming from lumbar disease.   Return to clinic in 3 months   Thank you for allowing me to participate in patient's care.  If I can answer any additional questions, I would be pleased to do so.    Sincerely,    Calandra Madura K. Posey Pronto, DO

## 2019-08-04 ENCOUNTER — Ambulatory Visit (INDEPENDENT_AMBULATORY_CARE_PROVIDER_SITE_OTHER): Payer: Medicare Other

## 2019-08-04 ENCOUNTER — Ambulatory Visit (INDEPENDENT_AMBULATORY_CARE_PROVIDER_SITE_OTHER): Payer: Medicare Other | Admitting: Podiatry

## 2019-08-04 ENCOUNTER — Other Ambulatory Visit: Payer: Self-pay

## 2019-08-04 ENCOUNTER — Other Ambulatory Visit: Payer: Self-pay | Admitting: Podiatry

## 2019-08-04 ENCOUNTER — Encounter: Payer: Self-pay | Admitting: Family Medicine

## 2019-08-04 ENCOUNTER — Encounter: Payer: Self-pay | Admitting: Podiatry

## 2019-08-04 VITALS — BP 124/75 | HR 87 | Resp 16

## 2019-08-04 DIAGNOSIS — E1142 Type 2 diabetes mellitus with diabetic polyneuropathy: Secondary | ICD-10-CM

## 2019-08-04 DIAGNOSIS — D3610 Benign neoplasm of peripheral nerves and autonomic nervous system, unspecified: Secondary | ICD-10-CM | POA: Diagnosis not present

## 2019-08-04 DIAGNOSIS — G5762 Lesion of plantar nerve, left lower limb: Secondary | ICD-10-CM

## 2019-08-04 NOTE — Patient Instructions (Signed)
Morton Neuralgia  Morton neuralgia is foot pain that affects the ball of the foot and the area near the toes. Morton neuralgia occurs when part of a nerve in the foot (digital nerve) is under too much pressure (compressed). When this happens over a long period of time, the nerve can thicken (neuroma) and cause pain. Pain usually occurs between the third and fourth toes.  Morton neuralgia can come and go but may get worse over time. What are the causes? This condition is caused by doing the same things over and over with your foot, such as:  Activities such as running or jumping.  Wearing shoes that are too tight. What increases the risk? You may be at higher risk for Morton neuralgia if you:  Are female.  Wear high heels.  Wear shoes that are narrow or tight.  Do activities that repeatedly stretch your toes, such as: ? Running. ? Ballet. ? Long-distance walking. What are the signs or symptoms? The first symptom of Morton neuralgia is pain that spreads from the ball of the foot to the toes. It may feel like you are walking on a marble. Pain usually gets worse with walking and goes away at night. Other symptoms may include numbness and cramping of your toes. Both feet are equally affected, but rarely at the same time. How is this diagnosed? This condition is diagnosed based on your symptoms, your medical history, and a physical exam. Your health care provider may:  Squeeze your foot just behind your toe.  Ask you to move your toes to check for pain.  Ask about your physical activity level. You also may have imaging tests, such as an X-ray, ultrasound, or MRI. How is this treated? Treatment depends on how severe your condition is and what causes it. Treatment may involve:  Wearing different shoes that are not too tight, are low-heeled, and provide good support. For some people, this is the only treatment needed.  Wearing an over-the-counter or custom supportive pad (orthotic)  under the front of your foot.  Getting injections of numbing medicine and anti-inflammatory medicine (steroid) in the nerve.  Having surgery to remove part of the thickened nerve. Follow these instructions at home: Managing pain, stiffness, and swelling   Massage your foot as needed.  Wear orthotics as told by your health care provider.  If directed, put ice on your foot: ? Put ice in a plastic bag. ? Place a towel between your skin and the bag. ? Leave the ice on for 20 minutes, 2-3 times a day.  Avoid activities that cause pain or make pain worse. If you play sports, ask your health care provider when it is safe for you to return to sports.  Raise (elevate) your foot above the level of your heart while lying down and, when possible, while sitting. General instructions  Take over-the-counter and prescription medicines only as told by your health care provider.  Do not drive or use heavy machinery while taking prescription pain medicine.  Wear shoes that: ? Have soft soles. ? Have a wide toe area. ? Provide arch support. ? Do not pinch or squeeze your feet. ? Have room for your orthotics, if applicable.  Keep all follow-up visits as told by your health care provider. This is important. Contact a health care provider if:  Your symptoms get worse or do not get better with treatment and home care. Summary  Morton neuralgia is foot pain that affects the ball of the foot and the   area near the toes. Pain usually occurs between the third and fourth toes, gets worse with walking, and goes away at night.  Morton neuralgia occurs when part of a nerve in the foot (digital nerve) is under too much pressure. When this happens over a long period of time, the nerve can thicken (neuroma) and cause pain.  This condition is caused by doing the same things over and over with your foot, such as running or jumping, wearing shoes that are too tight, or wearing high heels.  Treatment may  involve wearing low-heeled shoes that are not too tight, wearing a supportive pad (orthotic) under the front of your foot, getting injections in the nerve, or having surgery to remove part of the thickened nerve. This information is not intended to replace advice given to you by your health care provider. Make sure you discuss any questions you have with your health care provider. Document Released: 02/18/2001 Document Revised: 11/26/2017 Document Reviewed: 11/26/2017 Elsevier Patient Education  [place].    Diabetes Mellitus and [place] care is an important part of your health, especially when you have diabetes. Diabetes may cause you to have problems because of poor blood flow (circulation) to your feet and legs, which can cause your skin to:  Become thinner and drier.  Break more easily.  Heal more slowly.  Peel and crack. You may also have nerve damage (neuropathy) in your legs and feet, causing decreased feeling in them. This means that you may not notice minor injuries to your feet that could lead to more serious problems. Noticing and addressing any potential problems early is the best way to prevent future foot problems. How to care for your feet Foot hygiene  Wash your feet daily with warm water and mild soap. Do not use hot water. Then, pat your feet and the areas between your toes until they are completely dry. Do not soak your feet as this can dry your skin.  Trim your toenails straight across. Do not dig under them or around the cuticle. File the edges of your nails with an emery board or nail file.  Apply a moisturizing lotion or petroleum jelly to the skin on your feet and to dry, brittle toenails. Use lotion that does not contain alcohol and is unscented. Do not apply lotion between your toes. Shoes and socks  Wear clean socks or stockings every day. Make sure they are not too tight. Do not wear knee-high stockings since they may decrease blood flow to  your legs.  Wear shoes that fit properly and have enough cushioning. Always look in your shoes before you put them on to be sure there are no objects inside.  To break in new shoes, wear them for just a few hours a day. This prevents injuries on your feet. Wounds, scrapes, corns, and calluses  Check your feet daily for blisters, cuts, bruises, sores, and redness. If you cannot see the bottom of your feet, use a mirror or ask someone for help.  Do not cut corns or calluses or try to remove them with medicine.  If you find a minor scrape, cut, or break in the skin on your feet, keep it and the skin around it clean and dry. You may clean these areas with mild soap and water. Do not clean the area with peroxide, alcohol, or iodine.  If you have a wound, scrape, corn, or callus on your foot, look at it several times a day to make  sure it is healing and not infected. Check for: ? Redness, swelling, or pain. ? Fluid or blood. ? Warmth. ? Pus or a bad smell. General instructions  Do not cross your legs. This may decrease blood flow to your feet.  Do not use heating pads or hot water bottles on your feet. They may burn your skin. If you have lost feeling in your feet or legs, you may not know this is happening until it is too late.  Protect your feet from hot and cold by wearing shoes, such as at the beach or on hot pavement.  Schedule a complete foot exam at least once a year (annually) or more often if you have foot problems. If you have foot problems, report any cuts, sores, or bruises to your health care provider immediately. Contact a health care provider if:  You have a medical condition that increases your risk of infection and you have any cuts, sores, or bruises on your feet.  You have an injury that is not healing.  You have redness on your legs or feet.  You feel burning or tingling in your legs or feet.  You have pain or cramps in your legs and feet.  Your legs or feet are  numb.  Your feet always feel cold.  You have pain around a toenail. Get help right away if:  You have a wound, scrape, corn, or callus on your foot and: ? You have pain, swelling, or redness that gets worse. ? You have fluid or blood coming from the wound, scrape, corn, or callus. ? Your wound, scrape, corn, or callus feels warm to the touch. ? You have pus or a bad smell coming from the wound, scrape, corn, or callus. ? You have a fever. ? You have a red line going up your leg. Summary  Check your feet every day for cuts, sores, red spots, swelling, and blisters.  Moisturize feet and legs daily.  Wear shoes that fit properly and have enough cushioning.  If you have foot problems, report any cuts, sores, or bruises to your health care provider immediately.  Schedule a complete foot exam at least once a year (annually) or more often if you have foot problems. This information is not intended to replace advice given to you by your health care provider. Make sure you discuss any questions you have with your health care provider. Document Released: 11/09/2000 Document Revised: 12/25/2017 Document Reviewed: 12/14/2016 Elsevier Patient Education  2020 [employer].

## 2019-08-06 NOTE — Progress Notes (Signed)
Subjective:   Patient ID: Teresa Rice, female   DOB: 73 y.o.   MRN: [id number]   HPI 74 year old female presents the office today for concerns of burning pain to the forefoot on both sides left side worse than the right.  She says been ongoing for last couple months and gets worse with prolonged walking.  She said that she is also been getting some cramping in the toes.  She is previously been prescribed gabapentin but she stopped as it was not helping.  She has had no recent injury.  No radiating pain.  No other concerns.   Review of Systems  All other systems reviewed and are negative.  Past Medical History:  Diagnosis Date  . Bladder cancer [facility]) first dx 2012   Recurrent Bladder Cancer--  (urologist-  dr Diona Fanti)  . Chronic constipation   . Full dentures   . GERD (gastroesophageal reflux disease)   . Hyperlipidemia   . Hypertension   . Mild intermittent asthma   . OA (osteoarthritis)    fingers   . Trigger finger of both hands    wear slints and special gloves at night  . Type 2 diabetes mellitus ([place])     Past Surgical History:  Procedure Laterality Date  . BLADDER SURGERY  2012   in [place]   TURBT  . CHOLECYSTECTOMY  1970  . CYSTOSCOPY/RETROGRADE/URETEROSCOPY Left 05/24/2014   Procedure: CYSTOSCOPY/RETROGRADE/ URETEROSCOPY;  Surgeon: Jorja Loa, MD;  Location: [facility];  Service: Urology;  Laterality: Left;  . TRANSURETHRAL RESECTION OF BLADDER TUMOR N/A 05/24/2014   Procedure: TRANSURETHRAL RESECTION OF BLADDER TUMOR (TURBT) ;  Surgeon: Jorja Loa, MD;  Location: [facility];  Service: Urology;  Laterality: N/A;  . TRANSURETHRAL RESECTION OF BLADDER TUMOR N/A 08/04/2015   Procedure: TRANSURETHRAL RESECTION OF BLADDER TUMOR (TURBT);  Surgeon: Franchot Gallo, MD;  Location: [facility];  Service: Urology;  Laterality: N/A;     Current Outpatient Medications:  .  aspirin EC 81 MG tablet, Take 81 mg by mouth daily., Disp: , Rfl:  .   Calcium-Vitamin D (CALTRATE 600 PLUS-VIT D PO), Take 1 tablet by mouth 2 (two) times daily., Disp: , Rfl:  .  fenofibrate 160 MG tablet, , Disp: , Rfl:  .  glucose blood test strip, Use as instructed, Disp: 100 each, Rfl: 12 .  JANUMET 50-500 MG tablet, TAKE 1 TABLET BY MOUTH TWICE DAILY WITH A MEAL, Disp: 60 tablet, Rfl: 1 .  levothyroxine (SYNTHROID, LEVOTHROID) 25 MCG tablet, , Disp: , Rfl:  .  lisinopril (ZESTRIL) 40 MG tablet, , Disp: , Rfl:  .  magnesium oxide (MAG-OX) 400 MG tablet, Take 400 mg by mouth 2 (two) times daily., Disp: , Rfl:  .  Multiple Vitamin (MULTIVITAMIN WITH MINERALS) TABS tablet, Take 1 tablet by mouth daily., Disp: , Rfl:  .  olopatadine (PATANOL) 0.1 % ophthalmic solution, Place 1 drop into both eyes 2 (two) times daily., Disp: , Rfl:  .  Omega 3 1000 MG CAPS, Take 1,000 mg by mouth daily., Disp: , Rfl:  .  omeprazole (PRILOSEC) 20 MG capsule, Take 20 mg by mouth daily., Disp: , Rfl:  .  Polyethylene Glycol 3350 (MIRALAX PO), Take by mouth every evening., Disp: , Rfl:   Allergies  Allergen Reactions  . Celebrex [Celecoxib] Anaphylaxis  . Glipizide Itching  . Penicillins Other (See Comments)    "Burn marks from inside out"   . Sulfa Antibiotics Itching  Objective:  Physical Exam  General: AAO x3, NAD  Dermatological: Skin is warm, dry and supple bilateral. Nails x 10 are well manicured; remaining integument appears unremarkable at this time. There are no open sores, no preulcerative lesions, no rash or signs of infection present.  Vascular: Dorsalis Pedis artery and Posterior Tibial artery pedal pulses are 2/4 bilateral with immedate capillary fill time. There is no pain with calf compression, swelling, warmth, erythema.   Neruologic: Sensation mildly decreased with Semmes Weinstein monofilament mostly to the forefoot area.  Musculoskeletal: There is tenderness palpation on third interspaces bilaterally.  She is describing burning, tingling to her  toes most on the third and fourth toes.  She does get some other areas of tenderness subjectively but I am not able to elicit any tenderness to palpation on exam today she seems to get discomfort on metatarsal head area.  There is no edema, erythema.  No area pinpoint tenderness.  Muscular strength 5/5 in all groups tested bilateral.  Gait: Unassisted, Nonantalgic.       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 There is no evidence of acute fracture or stress fracture. -Discussed steroid injection.  She wants to hold off on this today and she was a schedule for another time to have the injection performed.  Dispensed a neuroma pads.  Discussed shoe modifications and orthotics to offload the area as well.  Follow-up at her convenience.  Trula Slade DPM

## 2019-08-09 NOTE — Progress Notes (Signed)
Virtual Visit via Video   Due to the COVID-19 pandemic, this visit was completed with telemedicine (audio/video) technology to reduce patient and provider exposure as well as to preserve personal protective equipment.   I connected with Bynum Bellows by a video enabled telemedicine application and verified that I am speaking with the correct person using two identifiers. Location patient: Home Location provider: [facility], Office Persons participating in the virtual visit: Kap[place], Mulrooney, DO   I discussed the limitations of evaluation and management by telemedicine and the availability of in person appointments. The patient expressed understanding and agreed to proceed.  Care Team   Patient Care Team: Briscoe Deutscher, DO as PCP - General (Family Medicine) Alda Berthold, DO as Consulting Physician (Neurology)  Subjective:   HPI: Patient states that the last fasting glucose was 164 today. She has been on Trulicity a few years ago. We did not have on list so I have added.   Swelling in both feet. Seen by podiatry but she would like to have you take a look. She has a follow up with them in October. She does have compression socks that she uses daily.   Review of Systems  Constitutional: Negative for chills and fever.  HENT: Negative for hearing loss and tinnitus.   Eyes: Negative for blurred vision and double vision.  Respiratory: Negative for cough.   Cardiovascular: Negative for chest pain, palpitations and leg swelling.  Gastrointestinal: Negative for nausea and vomiting.  Genitourinary: Negative for dysuria and urgency.  Neurological: Negative for dizziness and headaches.  Psychiatric/Behavioral: Negative for depression and suicidal ideas.     Patient Active Problem List   Diagnosis Date Noted  . Metatarsalgia of both feet 08/10/2019  . Morton's neuroma of both feet 08/10/2019  . Obesity, Class I, BMI 30-34.9 05/06/2019  . Seasonal allergies  05/06/2019  . Gastroesophageal reflux disease 05/06/2019  . Moderate persistent asthma without complication 123456  . Acquired hypothyroidism, on Levothyoxine 25 mcg daily 05/06/2019  . Chronic bilateral low back pain without sciatica 05/06/2019  . Diabetes mellitus ([place]) 05/06/2019  . Hyperlipidemia associated with type 2 diabetes mellitus ([place]) 05/06/2019  . Hypertension associated with diabetes ([place]) 05/06/2019  . PTSD (post-traumatic stress disorder) 11/10/2018  . Closed nondisplaced fracture of proximal phalanx of left little finger 09/13/2016  . Closed fracture of left distal radius 08/30/2016  . Bilateral carpal tunnel syndrome 10/11/2015  . Primary osteoarthritis of both hands 10/11/2015  . Trigger finger of both hands 10/11/2015    Social History   Tobacco Use  . Smoking status: Former Smoker    Years: 30.00    Types: Cigarettes    Quit date: 05/21/1994    Years since quitting: 25.2  . Smokeless tobacco: Never Used  Substance Use Topics  . Alcohol use: Yes    Comment: RARE   Current Outpatient Medications:  .  aspirin EC 81 MG tablet, Take 81 mg by mouth daily., Disp: , Rfl:  .  budesonide-formoterol (SYMBICORT) 160-4.5 MCG/ACT inhaler, Inhale 2 puffs into the lungs 2 (two) times daily., Disp: , Rfl:  .  Dulaglutide (TRULICITY) 1.5 0000000 SOPN, Inject into the skin., Disp: , Rfl:  .  fenofibrate 160 MG tablet, , Disp: , Rfl:  .  JANUMET 50-500 MG tablet, TAKE 1 TABLET BY MOUTH TWICE DAILY WITH A MEAL, Disp: 60 tablet, Rfl: 1 .  levothyroxine (SYNTHROID, LEVOTHROID) 25 MCG tablet, , Disp: , Rfl:  .  lisinopril (ZESTRIL) 40 MG  tablet, , Disp: , Rfl:  .  magnesium oxide (MAG-OX) 400 MG tablet, Take 400 mg by mouth 2 (two) times daily., Disp: , Rfl:  .  Multiple Vitamin (MULTIVITAMIN WITH MINERALS) TABS tablet, Take 1 tablet by mouth daily., Disp: , Rfl:  .  olopatadine (PATANOL) 0.1 % ophthalmic solution, Place 1 drop into both eyes 2 (two) times daily., Disp: , Rfl:    .  omeprazole (PRILOSEC) 20 MG capsule, Take 20 mg by mouth daily., Disp: , Rfl:  .  Polyethylene Glycol 3350 (MIRALAX PO), Take by mouth every evening., Disp: , Rfl:  .  gabapentin (NEURONTIN) 100 MG capsule, Take 1 capsule (100 mg total) by mouth 3 (three) times daily., Disp: 90 capsule, Rfl: 3  Allergies  Allergen Reactions  . Celebrex [Celecoxib] Anaphylaxis  . Glipizide Itching  . Penicillins Other (See Comments)    "Burn marks from inside out"   . Sulfa Antibiotics Itching    Objective:   VITALS: Per patient if applicable, see vitals. GENERAL: Alert and in no acute distress. CARDIOPULMONARY: No increased WOB. Speaking in clear sentences.  PSYCH: Pleasant and cooperative. Speech normal rate and rhythm. Affect is appropriate. Insight and judgement are appropriate. Attention is focused, linear, and appropriate.  NEURO: Oriented as arrived to appointment on time with no prompting.   Depression screen [facility] 2/9 05/28/2019  Decreased Interest 0  Down, Depressed, Hopeless 0  PHQ - 2 Score 0  Altered sleeping 0  Tired, decreased energy 0  Change in appetite 0  Feeling bad or failure about yourself  0  Trouble concentrating 0  Moving slowly or fidgety/restless 0  Suicidal thoughts 0  PHQ-9 Score 0   Assessment and Plan:   Ema was seen today for follow-up.  Diagnoses and all orders for this visit:  Bilateral lower extremity edema Comments: Ongoing. Previously on Lasix. Will restart and follow up in office in one week to recheck labs and have physical exam. Orders: -     furosemide (LASIX) 20 MG tablet; Take 1 tablet (20 mg total) by mouth daily. -     potassium chloride (K-DUR) 10 MEQ tablet; Take 1 tablet (10 mEq total) by mouth daily.  Type 2 diabetes mellitus with other specified complication, without long-term current use of insulin (HCC) -     Semaglutide, 1 MG/DOSE, (OZEMPIC, 1 MG/DOSE,) 2 MG/1.5ML SOPN; Inject 1 mg into the skin once a week.  Morton's neuroma of  both feet Comments: Reviewed Podiatry note.  Metatarsalgia of both feet Comments: Reviewed Podiatry note.    Marland Kitchen COVID-19 Education: The signs and symptoms of COVID-19 were discussed with the patient and how to seek care for testing if needed. The importance of social distancing was discussed today. . Reviewed expectations re: course of current medical issues. . Discussed self-management of symptoms. . Outlined signs and symptoms indicating need for more acute intervention. . Patient verbalized understanding and all questions were answered. Marland Kitchen Health Maintenance issues including appropriate healthy diet, exercise, and smoking avoidance were discussed with patient. . See orders for this visit as documented in the electronic medical record.  Briscoe Deutscher, DO  Records requested if needed. Time spent: 25 minutes, of which >50% was spent in obtaining information about her symptoms, reviewing her previous labs, evaluations, and treatments, counseling her about her condition (please see the discussed topics above), and developing a plan to further investigate it; she had a number of questions which I addressed.

## 2019-08-10 ENCOUNTER — Ambulatory Visit (INDEPENDENT_AMBULATORY_CARE_PROVIDER_SITE_OTHER): Payer: Medicare Other | Admitting: Family Medicine

## 2019-08-10 ENCOUNTER — Encounter: Payer: Self-pay | Admitting: Family Medicine

## 2019-08-10 VITALS — Ht 64.0 in | Wt 177.0 lb

## 2019-08-10 DIAGNOSIS — M7742 Metatarsalgia, left foot: Secondary | ICD-10-CM | POA: Diagnosis not present

## 2019-08-10 DIAGNOSIS — M7741 Metatarsalgia, right foot: Secondary | ICD-10-CM | POA: Diagnosis not present

## 2019-08-10 DIAGNOSIS — R600 Localized edema: Secondary | ICD-10-CM | POA: Diagnosis not present

## 2019-08-10 DIAGNOSIS — E1169 Type 2 diabetes mellitus with other specified complication: Secondary | ICD-10-CM | POA: Diagnosis not present

## 2019-08-10 DIAGNOSIS — G5763 Lesion of plantar nerve, bilateral lower limbs: Secondary | ICD-10-CM | POA: Insufficient documentation

## 2019-08-10 MED ORDER — POTASSIUM CHLORIDE ER 10 MEQ PO TBCR: 10.0000 meq | EXTENDED_RELEASE_TABLET | Freq: Every day | ORAL | 2 refills | Status: DC

## 2019-08-10 MED ORDER — OZEMPIC (1 MG/DOSE) 2 MG/1.5ML ~~LOC~~ SOPN: 1.0000 mg | PEN_INJECTOR | SUBCUTANEOUS | 3 refills | Status: DC

## 2019-08-10 MED ORDER — FUROSEMIDE 20 MG PO TABS: 20.0000 mg | ORAL_TABLET | Freq: Every day | ORAL | 3 refills | Status: DC

## 2019-08-12 ENCOUNTER — Ambulatory Visit (INDEPENDENT_AMBULATORY_CARE_PROVIDER_SITE_OTHER): Payer: Medicare Other

## 2019-08-12 ENCOUNTER — Other Ambulatory Visit: Payer: Self-pay

## 2019-08-12 DIAGNOSIS — Z23 Encounter for immunization: Secondary | ICD-10-CM | POA: Diagnosis not present

## 2019-08-14 ENCOUNTER — Other Ambulatory Visit: Payer: Self-pay

## 2019-08-14 MED ORDER — GABAPENTIN 100 MG PO CAPS: 100.0000 mg | ORAL_CAPSULE | Freq: Three times a day (TID) | ORAL | 3 refills | Status: DC

## 2019-08-17 ENCOUNTER — Telehealth: Payer: Self-pay | Admitting: Family Medicine

## 2019-08-17 NOTE — Telephone Encounter (Signed)
Please Advise.   >> Aug 17, 2019  9:45 AM Ivar Drape wrote: Reason for CRM:   Patient stated she was told by Dr. Juleen China on her 08/10/2019 virtual visit to come into the office the week of 08/17/19.  So she will need an In [facility]vc visit this week for Dr. Juleen China.

## 2019-08-17 NOTE — Telephone Encounter (Signed)
Ok to schedule patient for next available OV.

## 2019-08-20 NOTE — Progress Notes (Signed)
Virtual Visit via Video   Due to the COVID-19 pandemic, this visit was completed with telemedicine (audio/video) technology to reduce patient and provider exposure as well as to preserve personal protective equipment.   I connected with Teresa Rice by a video enabled telemedicine application and verified that I am speaking with the correct person using two identifiers. Location patient: Home Location provider: [facility], Office Persons participating in the virtual visit: Leani, [place], DO   I discussed the limitations of evaluation and management by telemedicine and the availability of in person appointments. The patient expressed understanding and agreed to proceed.  Care Team   Patient Care Team: Briscoe Deutscher, DO as PCP - General (Family Medicine) Alda Berthold, DO as Consulting Physician (Neurology)  Subjective:   Cough This is a new problem. The current episode started in the past 7 days. The problem has been gradually worsening. The cough is productive of purulent sputum. Associated symptoms include ear congestion, nasal congestion, rhinorrhea, a sore throat and shortness of breath. Pertinent negatives include no chest pain, chills, fever, headaches or rash. The symptoms are aggravated by lying down. Risk factors for lung disease include smoking/tobacco exposure. She has tried a beta-agonist inhaler, OTC cough suppressant, rest and steroid inhaler for the symptoms. The treatment provided mild relief. Her past medical history is significant for asthma and environmental allergies.    Patient Active Problem List   Diagnosis Date Noted  . Metatarsalgia of both feet 08/10/2019  . Morton's neuroma of both feet 08/10/2019  . Obesity, Class I, BMI 30-34.9 05/06/2019  . Seasonal allergies 05/06/2019  . Gastroesophageal reflux disease 05/06/2019  . Moderate persistent asthma without complication 123456  . Acquired hypothyroidism, on Levothyoxine 25 mcg  daily 05/06/2019  . Chronic bilateral low back pain without sciatica 05/06/2019  . Diabetes mellitus ([place]) 05/06/2019  . Hyperlipidemia associated with type 2 diabetes mellitus ([place]) 05/06/2019  . Hypertension associated with diabetes ([place]) 05/06/2019  . PTSD (post-traumatic stress disorder) 11/10/2018  . Closed nondisplaced fracture of proximal phalanx of left little finger 09/13/2016  . Closed fracture of left distal radius 08/30/2016  . Bilateral carpal tunnel syndrome 10/11/2015  . Primary osteoarthritis of both hands 10/11/2015  . Trigger finger of both hands 10/11/2015    Social History   Tobacco Use  . Smoking status: Former Smoker    Years: 30.00    Types: Cigarettes    Quit date: 05/21/1994    Years since quitting: 25.2  . Smokeless tobacco: Never Used  Substance Use Topics  . Alcohol use: Yes    Comment: RARE    Current Outpatient Medications:  .  ALPRAZolam (XANAX) 1 MG tablet, , Disp: , Rfl:  .  aspirin EC 81 MG tablet, Take 81 mg by mouth daily., Disp: , Rfl:  .  budesonide-formoterol (SYMBICORT) 160-4.5 MCG/ACT inhaler, Inhale 2 puffs into the lungs 2 (two) times daily., Disp: , Rfl:  .  Calcium-Vitamin D (CALTRATE 600 PLUS-VIT D PO), Take 1 tablet by mouth 2 (two) times daily., Disp: , Rfl:  .  fenofibrate 160 MG tablet, , Disp: , Rfl:  .  fluticasone (FLONASE) 50 MCG/ACT nasal spray, , Disp: , Rfl:  .  furosemide (LASIX) 20 MG tablet, Take 1 tablet (20 mg total) by mouth daily., Disp: 30 tablet, Rfl: 3 .  gabapentin (NEURONTIN) 100 MG capsule, Take 1 capsule (100 mg total) by mouth 3 (three) times daily., Disp: 90 capsule, Rfl: 3 .  glucose blood  test strip, Use as instructed, Disp: 100 each, Rfl: 12 .  levothyroxine (SYNTHROID, LEVOTHROID) 25 MCG tablet, , Disp: , Rfl:  .  lisinopril (ZESTRIL) 40 MG tablet, , Disp: , Rfl:  .  magnesium oxide (MAG-OX) 400 MG tablet, Take 400 mg by mouth 2 (two) times daily., Disp: , Rfl:  .  Multiple Vitamin (MULTIVITAMIN WITH  MINERALS) TABS tablet, Take 1 tablet by mouth daily., Disp: , Rfl:  .  olopatadine (PATANOL) 0.1 % ophthalmic solution, Place 1 drop into both eyes 2 (two) times daily., Disp: , Rfl:  .  Omega 3 1000 MG CAPS, Take 1,000 mg by mouth daily., Disp: , Rfl:  .  omeprazole (PRILOSEC) 20 MG capsule, Take 20 mg by mouth daily., Disp: , Rfl:  .  Polyethylene Glycol 3350 (MIRALAX PO), Take by mouth every evening., Disp: , Rfl:  .  potassium chloride (K-DUR) 10 MEQ tablet, Take 1 tablet (10 mEq total) by mouth daily., Disp: 30 tablet, Rfl: 2 .  Semaglutide, 1 MG/DOSE, (OZEMPIC, 1 MG/DOSE,) 2 MG/1.5ML SOPN, Inject 1 mg into the skin once a week., Disp: 1 pen, Rfl: 3 .  sitaGLIPtin-metformin (JANUMET) 50-500 MG tablet, TAKE 1 TABLET BY MOUTH TWICE DAILY WITH A MEAL, Disp: 60 tablet, Rfl: 4 .  Dulaglutide (TRULICITY) 1.5 0000000 SOPN, Inject into the skin., Disp: , Rfl:   Allergies  Allergen Reactions  . Celebrex [Celecoxib] Anaphylaxis  . Glipizide Itching  . Penicillins Other (See Comments)    "Burn marks from inside out"   . Sulfa Antibiotics Itching   Objective:   VITALS: Per patient if applicable, see vitals. GENERAL: Alert and in no acute distress. CARDIOPULMONARY: No increased WOB. Speaking in clear sentences.  PSYCH: Pleasant and cooperative. Speech normal rate and rhythm. Affect is appropriate. Insight and judgement are appropriate. Attention is focused, linear, and appropriate.  NEURO: Oriented as arrived to appointment on time with no prompting.   Depression screen [facility] 2/9 08/21/2019 05/28/2019  Decreased Interest 0 0  Down, Depressed, Hopeless 0 0  PHQ - 2 Score 0 0  Altered sleeping - 0  Tired, decreased energy - 0  Change in appetite - 0  Feeling bad or failure about yourself  - 0  Trouble concentrating - 0  Moving slowly or fidgety/restless - 0  Suicidal thoughts - 0  PHQ-9 Score - 0   Assessment and Plan:   Areliz was seen today for cough.  Diagnoses and all orders for this  visit:  Cough Comments: Patient declines COVID testing today but will isolate. Exposure to grandson with fever a week ago - eval by pediatrician and Dx URI. Orders: -     azithromycin (ZITHROMAX) 250 MG tablet; 2 po on day one, then 1 po daily until gone. -     guaiFENesin-codeine (CHERATUSSIN AC) 100-10 MG/5ML syrup; Take 10 mLs by mouth at bedtime as needed for cough or congestion.  Type 2 diabetes mellitus with other specified complication, without long-term current use of insulin (HCC) -     sitaGLIPtin-metformin (JANUMET) 50-500 MG tablet; TAKE 1 TABLET BY MOUTH TWICE DAILY WITH A MEAL   . COVID-19 Education: The signs and symptoms of COVID-19 were discussed with the patient and how to seek care for testing if needed. The importance of social distancing was discussed today. . Reviewed expectations re: course of current medical issues. . Discussed self-management of symptoms. . Outlined signs and symptoms indicating need for more acute intervention. . Patient verbalized understanding and all questions were  answered. Marland Kitchen Health Maintenance issues including appropriate healthy diet, exercise, and smoking avoidance were discussed with patient. . See orders for this visit as documented in the electronic medical record.  Briscoe Deutscher, DO  Records requested if needed. Time spent: 15 minutes, of which >50% was spent in obtaining information about her symptoms, reviewing her previous labs, evaluations, and treatments, counseling her about her condition (please see the discussed topics above), and developing a plan to further investigate it; she had a number of questions which I addressed.

## 2019-08-21 ENCOUNTER — Other Ambulatory Visit: Payer: Self-pay

## 2019-08-21 ENCOUNTER — Ambulatory Visit (INDEPENDENT_AMBULATORY_CARE_PROVIDER_SITE_OTHER): Payer: Medicare Other

## 2019-08-21 ENCOUNTER — Encounter: Payer: Self-pay | Admitting: Family Medicine

## 2019-08-21 ENCOUNTER — Ambulatory Visit (INDEPENDENT_AMBULATORY_CARE_PROVIDER_SITE_OTHER): Payer: Medicare Other | Admitting: Family Medicine

## 2019-08-21 VITALS — Temp 98.4°F | Ht 64.0 in | Wt 174.0 lb

## 2019-08-21 DIAGNOSIS — Z20828 Contact with and (suspected) exposure to other viral communicable diseases: Secondary | ICD-10-CM

## 2019-08-21 DIAGNOSIS — R05 Cough: Secondary | ICD-10-CM

## 2019-08-21 DIAGNOSIS — E1169 Type 2 diabetes mellitus with other specified complication: Secondary | ICD-10-CM | POA: Diagnosis not present

## 2019-08-21 DIAGNOSIS — Z0000 Encounter for general adult medical examination without abnormal findings: Secondary | ICD-10-CM

## 2019-08-21 DIAGNOSIS — R059 Cough, unspecified: Secondary | ICD-10-CM

## 2019-08-21 MED ORDER — JANUMET 50-500 MG PO TABS: ORAL_TABLET | ORAL | 4 refills | Status: DC

## 2019-08-21 MED ORDER — AZITHROMYCIN 250 MG PO TABS: ORAL_TABLET | ORAL | 0 refills | Status: DC

## 2019-08-21 MED ORDER — GUAIFENESIN-CODEINE 100-10 MG/5ML PO SYRP: 10.0000 mL | ORAL_SOLUTION | Freq: Every evening | ORAL | 0 refills | Status: DC | PRN

## 2019-08-21 NOTE — Progress Notes (Signed)
I have reviewed documentation for AWV and Advance Care planning provided by [facility], I agree with documentation, I was immediately available for any questions. Koron Godeaux, DO   

## 2019-08-21 NOTE — Progress Notes (Signed)
This visit is being conducted via phone call due to the COVID-19 pandemic. This patient has given me verbal consent via phone to conduct this visit, patient states they are participating from their home address. Some vital signs may be absent or patient reported.   Patient identification: identified by name, DOB, and current address.  Visit conducted from office setting by myself.  Patient Teresa Rice) and myself Denman George LPN) participating in visit.   Subjective:   Teresa Rice is a 73 y.o. female who presents for an Initial Medicare Annual Wellness Visit.  Review of Systems     Cardiac Risk Factors include: diabetes mellitus;hypertension;dyslipidemia;advanced age (>40men, >88 women)     Objective:    There were no vitals filed for this visit. There is no height or weight on file to calculate BMI.  Advanced Directives 08/21/2019 07/30/2019 06/26/2019 08/04/2015 05/21/2014 05/21/2014  Does Patient Have a Medical Advance Directive? No No No No Patient does not have advance directive;Patient would not like information Patient does not have advance directive  Would patient like information on creating a medical advance directive? Yes (MAU/Ambulatory/Procedural Areas - Information given) - - No - patient declined information - -    Current Medications (verified) Outpatient Encounter Medications as of 08/21/2019  Medication Sig  . ALPRAZolam (XANAX) 1 MG tablet   . aspirin EC 81 MG tablet Take 81 mg by mouth daily.  . budesonide-formoterol (SYMBICORT) 160-4.5 MCG/ACT inhaler Inhale 2 puffs into the lungs 2 (two) times daily.  . Calcium-Vitamin D (CALTRATE 600 PLUS-VIT D PO) Take 1 tablet by mouth 2 (two) times daily.  . Dulaglutide (TRULICITY) 1.5 0000000 SOPN Inject into the skin.  . fenofibrate 160 MG tablet   . fluticasone (FLONASE) 50 MCG/ACT nasal spray   . furosemide (LASIX) 20 MG tablet Take 1 tablet (20 mg total) by mouth daily.  Marland Kitchen gabapentin (NEURONTIN) 100 MG capsule  Take 1 capsule (100 mg total) by mouth 3 (three) times daily.  Marland Kitchen glucose blood test strip Use as instructed  . JANUMET 50-500 MG tablet TAKE 1 TABLET BY MOUTH TWICE DAILY WITH A MEAL  . levothyroxine (SYNTHROID, LEVOTHROID) 25 MCG tablet   . lisinopril (ZESTRIL) 40 MG tablet   . magnesium oxide (MAG-OX) 400 MG tablet Take 400 mg by mouth 2 (two) times daily.  . Multiple Vitamin (MULTIVITAMIN WITH MINERALS) TABS tablet Take 1 tablet by mouth daily.  Marland Kitchen olopatadine (PATANOL) 0.1 % ophthalmic solution Place 1 drop into both eyes 2 (two) times daily.  . Omega 3 1000 MG CAPS Take 1,000 mg by mouth daily.  Marland Kitchen omeprazole (PRILOSEC) 20 MG capsule Take 20 mg by mouth daily.  . Polyethylene Glycol 3350 (MIRALAX PO) Take by mouth every evening.  . potassium chloride (K-DUR) 10 MEQ tablet Take 1 tablet (10 mEq total) by mouth daily.  . Semaglutide, 1 MG/DOSE, (OZEMPIC, 1 MG/DOSE,) 2 MG/1.5ML SOPN Inject 1 mg into the skin once a week.   No facility-administered encounter medications on file as of 08/21/2019.     Allergies (verified) Celebrex [celecoxib], Glipizide, Penicillins, and Sulfa antibiotics   History: Past Medical History:  Diagnosis Date  . Bladder cancer [facility]) first dx 2012   Recurrent Bladder Cancer--  (urologist-  dr Diona Fanti)  . Chronic constipation   . Full dentures   . GERD (gastroesophageal reflux disease)   . Hyperlipidemia   . Hypertension   . Mild intermittent asthma   . OA (osteoarthritis)    fingers   .  Trigger finger of both hands    wear slints and special gloves at night  . Type 2 diabetes mellitus ([place])    Past Surgical History:  Procedure Laterality Date  . BLADDER SURGERY  2012   in [place]   TURBT  . CHOLECYSTECTOMY  1970  . CYSTOSCOPY/RETROGRADE/URETEROSCOPY Left 05/24/2014   Procedure: CYSTOSCOPY/RETROGRADE/ URETEROSCOPY;  Surgeon: Jorja Loa, MD;  Location: [facility];  Service: Urology;  Laterality: Left;  . TRANSURETHRAL RESECTION OF BLADDER  TUMOR N/A 05/24/2014   Procedure: TRANSURETHRAL RESECTION OF BLADDER TUMOR (TURBT) ;  Surgeon: Jorja Loa, MD;  Location: [facility];  Service: Urology;  Laterality: N/A;  . TRANSURETHRAL RESECTION OF BLADDER TUMOR N/A 08/04/2015   Procedure: TRANSURETHRAL RESECTION OF BLADDER TUMOR (TURBT);  Surgeon: Franchot Gallo, MD;  Location: [facility];  Service: Urology;  Laterality: N/A;   Family History  Problem Relation Age of Onset  . Early death Mother   . AAA (abdominal aortic aneurysm) Sister   . Cancer Father   . Diabetes Brother   . Cancer Brother    Social History   Socioeconomic History  . Marital status: Widowed    Spouse name: Not on file  . Number of children: 1  . Years of education: 89  . Highest education level: Not on file  Occupational History  . Occupation: retired  Scientific laboratory technician  . Financial resource strain: Not on file  . Food insecurity    Worry: Not on file    Inability: Not on file  . Transportation needs    Medical: Not on file    Non-medical: Not on file  Tobacco Use  . Smoking status: Former Smoker    Years: 30.00    Types: Cigarettes    Quit date: 05/21/1994    Years since quitting: 25.2  . Smokeless tobacco: Never Used  Substance and Sexual Activity  . Alcohol use: Yes    Comment: RARE  . Drug use: No  . Sexual activity: Not on file  Lifestyle  . Physical activity    Days per week: 6 days    Minutes per session: 20 min  . Stress: Not on file  Relationships  . Social Herbalist on phone: Not on file    Gets together: Not on file    Attends religious service: Not on file    Active member of club or organization: Not on file    Attends meetings of clubs or organizations: Not on file    Relationship status: Not on file  Other Topics Concern  . Not on file  Social History Narrative   Lives in an apartment, lives alone   One daughter   Right handed    Retired from [employer] work   Highest level of education:  GED     Tobacco Counseling Counseling given: Not Answered   Clinical Intake:  Pre-visit preparation completed: Yes  Diabetes: Yes CBG done?: No Did pt. bring in CBG monitor from home?: No  How often do you need to have someone help you when you read instructions, pamphlets, or other written materials from your doctor or pharmacy?: 1 - Never  Interpreter Needed?: No  Information entered by :: Denman George LPN   Activities of Daily Living In your present state of health, do you have any difficulty performing the following activities: 08/21/2019  Hearing? N  Vision? N  Difficulty concentrating or making decisions? N  Walking or climbing stairs? N  Dressing  or bathing? N  Doing errands, shopping? N  Preparing Food and eating ? N  Using the Toilet? N  In the past six months, have you accidently leaked urine? N  Do you have problems with loss of bowel control? N  Managing your Medications? N  Managing your Finances? N  Housekeeping or managing your Housekeeping? N  Some recent data might be hidden     Immunizations and Health Maintenance Immunization History  Administered Date(s) Administered  . Fluad Quad(high Dose 65+) 08/12/2019  . Pneumococcal Conjugate-13 07/09/2019   Health Maintenance Due  Topic Date Due  . Hepatitis C Screening  1946/08/12  . FOOT EXAM  09/18/1955  . OPHTHALMOLOGY EXAM  09/18/1955  . TETANUS/TDAP  09/17/1964  . COLONOSCOPY  09/18/1995  . DEXA SCAN  09/17/2010    Patient Care Team: Briscoe Deutscher, DO as PCP - General (Family Medicine) Alda Berthold, DO as Consulting Physician (Neurology)  Indicate any recent Medical Services you may have received from other than [facility] providers in the past year (date may be approximate).     Assessment:   This is a routine wellness examination for Katherin.  Hearing/Vision screen No exam data present  Dietary issues and exercise activities discussed: Current Exercise Habits: The patient does not  participate in regular exercise at present  Goals   None    Depression Screen PHQ 2/9 Scores 08/21/2019 05/28/2019  PHQ - 2 Score 0 0  PHQ- 9 Score - 0    Fall Risk Fall Risk  08/21/2019 07/30/2019 06/26/2019 05/28/2019  Falls in the past year? 0 0 0 0  Number falls in past yr: 0 0 0 0  Injury with Fall? 0 0 0 0  Follow up Education provided;Falls prevention discussed;Falls evaluation completed - - -    Is the patient's home free of loose throw rugs in walkways, pet beds, electrical cords, etc?   yes      Grab bars in the bathroom? yes      Handrails on the stairs?   yes      Adequate lighting?   yes  Cognitive Function: no cognitive concerns at this time  Alert? Yes         Normal Appearance? N/a  Oriented to person? Yes           Place? Yes  Time? Yes  Recall of three objects? Yes  Can perform simple calculations? Yes  Displays appropriate judgment? Yes  Can read the correct time from a watch face? Yes          Screening Tests Health Maintenance  Topic Date Due  . Hepatitis C Screening  [birth year]  . FOOT EXAM  09/18/1955  . OPHTHALMOLOGY EXAM  09/18/1955  . TETANUS/TDAP  09/17/1964  . COLONOSCOPY  09/18/1995  . DEXA SCAN  09/17/2010  . HEMOGLOBIN A1C  12/02/2019  . PNA vac Low Risk Adult (2 of 2 - PPSV23) 07/08/2020  . MAMMOGRAM  02/05/2021  . INFLUENZA VACCINE  Completed    Qualifies for Shingles Vaccine? Discussed and patient will check with pharmacy for coverage.  Patient education handout provided    Cancer Screenings: Lung: Low Dose CT Chest recommended if Age 58-80 years, 30 pack-year currently smoking OR have quit w/in 15years. Patient does not qualify. Breast: Up to date on Mammogram? Yes   Up to date of Bone Density/Dexa? Yes Colorectal: colonoscopy 02/02/10; due 01/2020    Plan:  I have personally reviewed and addressed the Medicare Annual Wellness questionnaire  and have noted the following in the patient's chart:  A. Medical and social history B.  Use of alcohol, tobacco or illicit drugs  C. Current medications and supplements D. Functional ability and status E.  Nutritional status F.  Physical activity G. Advance directives H. List of other physicians I.  Hospitalizations, surgeries, and ER visits in previous 12 months J.  [place] such as hearing and vision if needed, cognitive and depression L. Referrals, records requested, and appointments- none (will request records from diabetic eye exam and dexa)   In addition, I have reviewed and discussed with patient certain preventive protocols, quality metrics, and best practice recommendations. A written personalized care plan for preventive services as well as general preventive health recommendations were provided to patient.   Signed,  Denman George, LPN  Nurse Health Advisor   Nurse Notes: no additional

## 2019-08-21 NOTE — Patient Instructions (Signed)
Teresa Rice , Thank you for taking time to come for your Medicare Wellness Visit. I appreciate your ongoing commitment to your health goals. Please review the following plan we discussed and let me know if I can assist you in the future.   Screening recommendations/referrals: Colorectal Screening: last colonoscopy 02/02/10 Mammogram: completed 02/06/19 Bone Density: will request records from last provider   Vision and Dental Exams: Recommended annual ophthalmology exams for early detection of glaucoma and other disorders of the eye Recommended annual dental exams for proper oral hygiene  Diabetic Exams: Diabetic Eye Exam: will request exam notes from this year  Diabetic Foot Exam: at next visit   Vaccinations: Influenza vaccine: completed 08/12/19 Pneumococcal vaccine: up to date; last 07/09/19 Tdap vaccine: Please call your insurance company to determine your out of pocket expense. You may also receive this vaccine at your local pharmacy or Health Dept. Shingles vaccine: Please call your insurance company to determine your out of pocket expense for the Shingrix vaccine. You may receive this vaccine at your local pharmacy.  Advanced directives: Advance directives discussed with you today. I have provided a copy for you to complete at home and have notarized. Once this is complete please bring a copy in to our office so we can scan it into your chart.  Goals: Recommend to drink at least 6-8 8oz glasses of water per day.  Next appointment: Please schedule your Annual Wellness Visit with your Nurse Health Advisor in one year.  Preventive Care 25 Years and Older, Female Preventive care refers to lifestyle choices and visits with your health care provider that can promote health and wellness. What does preventive care include?  A yearly physical exam. This is also called an annual well check.  Dental exams once or twice a year.  Routine eye exams. Ask your health care provider how often  you should have your eyes checked.  Personal lifestyle choices, including:  Daily care of your teeth and gums.  Regular physical activity.  Eating a healthy diet.  Avoiding tobacco and drug use.  Limiting alcohol use.  Practicing safe sex.  Taking low-dose aspirin every day if recommended by your health care provider.  Taking vitamin and mineral supplements as recommended by your health care provider. What happens during an annual well check? The services and screenings done by your health care provider during your annual well check will depend on your age, overall health, lifestyle risk factors, and family history of disease. Counseling  Your health care provider may ask you questions about your:  Alcohol use.  Tobacco use.  Drug use.  Emotional well-being.  Home and relationship well-being.  Sexual activity.  Eating habits.  History of falls.  Memory and ability to understand (cognition).  Work and work Statistician.  Reproductive health. Screening  You may have the following tests or measurements:  Height, weight, and BMI.  Blood pressure.  Lipid and cholesterol levels. These may be checked every 5 years, or more frequently if you are over 72 years old.  Skin check.  Lung cancer screening. You may have this screening every year starting at age 55 if you have a 30-pack-year history of smoking and currently smoke or have quit within the past 15 years.  Fecal occult blood test (FOBT) of the stool. You may have this test every year starting at age 39.  Flexible sigmoidoscopy or colonoscopy. You may have a sigmoidoscopy every 5 years or a colonoscopy every 10 years starting at age 58.  Hepatitis  C blood test.  Hepatitis B blood test.  Sexually transmitted disease (STD) testing.  Diabetes screening. This is done by checking your blood sugar (glucose) after you have not eaten for a while (fasting). You may have this done every 1-3 years.  Bone density  scan. This is done to screen for osteoporosis. You may have this done starting at age 51.  Mammogram. This may be done every 1-2 years. Talk to your health care provider about how often you should have regular mammograms. Talk with your health care provider about your test results, treatment options, and if necessary, the need for more tests. Vaccines  Your health care provider may recommend certain vaccines, such as:  Influenza vaccine. This is recommended every year.  Tetanus, diphtheria, and acellular pertussis (Tdap, Td) vaccine. You may need a Td booster every 10 years.  Zoster vaccine. You may need this after age 51.  Pneumococcal 13-valent conjugate (PCV13) vaccine. One dose is recommended after age 76.  Pneumococcal polysaccharide (PPSV23) vaccine. One dose is recommended after age 27. Talk to your health care provider about which screenings and vaccines you need and how often you need them. This information is not intended to replace advice given to you by your health care provider. Make sure you discuss any questions you have with your health care provider. Document Released: 12/09/2015 Document Revised: 08/01/2016 Document Reviewed: 09/13/2015 Elsevier Interactive Patient Education  2017 [employer] Prevention in the Home Falls can cause injuries. They can happen to people of all ages. There are many things you can do to make your home safe and to help prevent falls. What can I do on the outside of my home?  Regularly fix the edges of walkways and driveways and fix any cracks.  Remove anything that might make you trip as you walk through a door, such as a raised step or threshold.  Trim any bushes or trees on the path to your home.  Use bright outdoor lighting.  Clear any walking paths of anything that might make someone trip, such as rocks or tools.  Regularly check to see if handrails are loose or broken. Make sure that both sides of any steps have  handrails.  Any raised decks and porches should have guardrails on the edges.  Have any leaves, snow, or ice cleared regularly.  Use sand or salt on walking paths during winter.  Clean up any spills in your garage right away. This includes oil or grease spills. What can I do in the bathroom?  Use night lights.  Install grab bars by the toilet and in the tub and shower. Do not use towel bars as grab bars.  Use non-skid mats or decals in the tub or shower.  If you need to sit down in the shower, use a plastic, non-slip stool.  Keep the floor dry. Clean up any water that spills on the floor as soon as it happens.  Remove soap buildup in the tub or shower regularly.  Attach bath mats securely with double-sided non-slip rug tape.  Do not have throw rugs and other things on the floor that can make you trip. What can I do in the bedroom?  Use night lights.  Make sure that you have a light by your bed that is easy to reach.  Do not use any sheets or blankets that are too big for your bed. They should not hang down onto the floor.  Have a firm chair that has side arms.  You can use this for support while you get dressed.  Do not have throw rugs and other things on the floor that can make you trip. What can I do in the kitchen?  Clean up any spills right away.  Avoid walking on wet floors.  Keep items that you use a lot in easy-to-reach places.  If you need to reach something above you, use a strong step stool that has a grab bar.  Keep electrical cords out of the way.  Do not use floor polish or wax that makes floors slippery. If you must use wax, use non-skid floor wax.  Do not have throw rugs and other things on the floor that can make you trip. What can I do with my stairs?  Do not leave any items on the stairs.  Make sure that there are handrails on both sides of the stairs and use them. Fix handrails that are broken or loose. Make sure that handrails are as long as  the stairways.  Check any carpeting to make sure that it is firmly attached to the stairs. Fix any carpet that is loose or worn.  Avoid having throw rugs at the top or bottom of the stairs. If you do have throw rugs, attach them to the floor with carpet tape.  Make sure that you have a light switch at the top of the stairs and the bottom of the stairs. If you do not have them, ask someone to add them for you. What else can I do to help prevent falls?  Wear shoes that:  Do not have high heels.  Have rubber bottoms.  Are comfortable and fit you well.  Are closed at the toe. Do not wear sandals.  If you use a stepladder:  Make sure that it is fully opened. Do not climb a closed stepladder.  Make sure that both sides of the stepladder are locked into place.  Ask someone to hold it for you, if possible.  Clearly mark and make sure that you can see:  Any grab bars or handrails.  First and last steps.  Where the edge of each step is.  Use tools that help you move around (mobility aids) if they are needed. These include:  Canes.  Walkers.  Scooters.  Crutches.  Turn on the lights when you go into a dark area. Replace any light bulbs as soon as they burn out.  Set up your furniture so you have a clear path. Avoid moving your furniture around.  If any of your floors are uneven, fix them.  If there are any pets around you, be aware of where they are.  Review your medicines with your doctor. Some medicines can make you feel dizzy. This can increase your chance of falling. Ask your doctor what other things that you can do to help prevent falls. This information is not intended to replace advice given to you by your health care provider. Make sure you discuss any questions you have with your health care provider. Document Released: 09/08/2009 Document Revised: 04/19/2016 Document Reviewed: 12/17/2014 Elsevier Interactive Patient Education  2017 [employer].

## 2019-08-24 ENCOUNTER — Encounter: Payer: Self-pay | Admitting: Family Medicine

## 2019-08-24 ENCOUNTER — Other Ambulatory Visit: Payer: Self-pay | Admitting: Family Medicine

## 2019-08-24 DIAGNOSIS — E1169 Type 2 diabetes mellitus with other specified complication: Secondary | ICD-10-CM

## 2019-08-24 NOTE — Addendum Note (Signed)
Addended by: Dimple Nanas on: 08/24/2019 03:41 PM   Modules accepted: Orders

## 2019-08-24 NOTE — Telephone Encounter (Signed)
Requested medication (s) are due for refill today: Yes  Requested medication (s) are on the active medication list: Yes  Last refill:  05/30/19 by historical provider  Future visit scheduled: No  Notes to clinic:  Unable to refill per protocol, last refilled by another provider     Requested Prescriptions  Pending Prescriptions Disp Refills   fenofibrate 160 MG tablet        Cardiovascular:  Antilipid - Fibric Acid Derivatives Failed - 08/24/2019  3:41 PM      Failed - Total Cholesterol in normal range and within 360 days    No results found for: CHOL, POCCHOL       Failed - LDL in normal range and within 360 days    No results found for: LDLCALC, LDLC, HIRISKLDL       Failed - HDL in normal range and within 360 days    No results found for: HDL       Failed - Triglycerides in normal range and within 360 days    No results found for: TRIG       Passed - ALT in normal range and within 180 days    ALT  Date Value Ref Range Status  06/01/2019 28 0 - 35 U/L Final         Passed - AST in normal range and within 180 days    AST  Date Value Ref Range Status  06/01/2019 18 0 - 37 U/L Final         Passed - Cr in normal range and within 180 days    Creatinine, Ser  Date Value Ref Range Status  06/01/2019 0.82 0.40 - 1.20 mg/dL Final         Passed - eGFR in normal range and within 180 days    GFR calc Af Amer  Date Value Ref Range Status  05/24/2014 >90 >90 mL/min Final    Comment:    (NOTE) The eGFR has been calculated using the CKD EPI equation. This calculation has not been validated in all clinical situations. eGFR's persistently <90 mL/min signify possible Chronic Kidney Disease.   GFR calc non Af Amer  Date Value Ref Range Status  05/24/2014 87 (L) >90 mL/min Final   GFR  Date Value Ref Range Status  06/01/2019 68.20 >60.00 mL/min Final         Passed - Valid encounter within last 12 months    Recent Outpatient Visits          3 days ago Cough   [place] Wallace, [place]ol[place], DO   2 weeks ago Bilateral lower extremity edema   [place] Wallace, [place], DO   2 months ago Bilateral leg pain   [place], DO   3 months ago Hypertension associated with diabetes [facility])   [place] Wallace, [place], [place]

## 2019-08-24 NOTE — Telephone Encounter (Signed)
Copied from [place] [phone] [id number]. Topic: Quick Communication - Rx Refill/Question >> Aug 24, 2019  3:28 PM Leward Quan A wrote: Medication: fenofibrate 160 MG tablet   Has the patient contacted their pharmacy? Yes.   (Agent: If no, request that the patient contact the pharmacy for the refill.) (Agent: If yes, when and what did the pharmacy advise?)  Preferred Pharmacy (with phone number or street name): [place], [place] [phone] (Phone) [phone] (Fax)    Agent: Please be advised that RX refills may take up to 3 business days. We ask that you follow-up with your pharmacy.

## 2019-08-25 MED ORDER — FENOFIBRATE 160 MG PO TABS: 160.0000 mg | ORAL_TABLET | Freq: Every day | ORAL | 2 refills | Status: DC

## 2019-08-25 NOTE — Telephone Encounter (Signed)
See below

## 2019-08-28 MED ORDER — BLOOD GLUCOSE MONITOR KIT: PACK | 0 refills | Status: DC

## 2019-08-28 NOTE — Telephone Encounter (Signed)
Patient called in and said she needed a new meter and a new needle that hers had broke/ Please advise.

## 2019-08-28 NOTE — Telephone Encounter (Signed)
I faxed over Rx for meter to [facility] today.

## 2019-08-28 NOTE — Addendum Note (Signed)
Addended by: Darral Dash on: 08/28/2019 05:06 PM   Modules accepted: Orders

## 2019-08-31 ENCOUNTER — Ambulatory Visit: Payer: Medicare Other | Admitting: Physician Assistant

## 2019-08-31 NOTE — Telephone Encounter (Signed)
Copied from [place] [phone]. Topic: General - Other >> Aug 31, 2019  1:55 PM Leward Quan A wrote: Reason for CRM: [place] called to ask if it is ok to change the directions on the Rx that was sent in for Glucometer and test strips to testing twice daily as per patient. Please call [facility] at Ph# [phone] Ref# [id number]

## 2019-09-01 ENCOUNTER — Ambulatory Visit (INDEPENDENT_AMBULATORY_CARE_PROVIDER_SITE_OTHER): Payer: Medicare Other | Admitting: Physician Assistant

## 2019-09-01 ENCOUNTER — Encounter: Payer: Self-pay | Admitting: Physician Assistant

## 2019-09-01 VITALS — Temp 96.6°F | Ht 64.0 in | Wt 177.0 lb

## 2019-09-01 DIAGNOSIS — R0981 Nasal congestion: Secondary | ICD-10-CM

## 2019-09-01 DIAGNOSIS — J4540 Moderate persistent asthma, uncomplicated: Secondary | ICD-10-CM

## 2019-09-01 MED ORDER — BUDESONIDE-FORMOTEROL FUMARATE 160-4.5 MCG/ACT IN AERO: 2.0000 | INHALATION_SPRAY | Freq: Two times a day (BID) | RESPIRATORY_TRACT | 11 refills | Status: DC

## 2019-09-01 MED ORDER — IPRATROPIUM BROMIDE 0.03 % NA SOLN: 2.0000 | Freq: Two times a day (BID) | NASAL | 1 refills | Status: DC

## 2019-09-01 NOTE — Progress Notes (Signed)
TELEPHONE ENCOUNTER   Patient verbally agreed to telephone visit and is aware that copayment and coinsurance may apply. Patient was treated using telemedicine according to accepted telemedicine protocols.  Location of the patient: home Location of provider: [place] Names of all persons participating in the telemedicine service and role in the encounter: Inda Coke, [place] , Anselmo Pickler, LPN, Eulas Post  I acted as a Education administrator for [employer], PA-C Anselmo Pickler, LPN  Subjective:   Chief Complaint  Patient presents with  . Nasal Congestion     HPI   Nasal congestion Pt c/o nasal congestion and runny nose started yesterday. She is having green nasal drainage. Pt denies fever, chills, headaches, dizziness, cough or diarrhea. She has tried Flonase works for a little while and Tylenol.   She was seen by PCP on 08/21/19 -- was given azithromycin and cough syrup. She had cough at that time and declined COVID testing. Her symptoms resolved with treatment.  She is also asthmatic -- uses symbicort. States that she needs refill. Denies any significant concerns with her asthma at this time.  Patient Active Problem List   Diagnosis Date Noted  . Metatarsalgia of both feet 08/10/2019  . Morton's neuroma of both feet 08/10/2019  . Obesity, Class I, BMI 30-34.9 05/06/2019  . Seasonal allergies 05/06/2019  . Gastroesophageal reflux disease 05/06/2019  . Moderate persistent asthma without complication 41/28/7867  . Acquired hypothyroidism, on Levothyoxine 25 mcg daily 05/06/2019  . Chronic bilateral low back pain without sciatica 05/06/2019  . Diabetes mellitus ([place]) 05/06/2019  . Hyperlipidemia associated with type 2 diabetes mellitus ([place]) 05/06/2019  . Hypertension associated with diabetes ([place]) 05/06/2019  . PTSD (post-traumatic stress disorder) 11/10/2018  . Closed nondisplaced fracture of proximal phalanx of left little finger 09/13/2016  . Closed fracture of  left distal radius 08/30/2016  . Bilateral carpal tunnel syndrome 10/11/2015  . Primary osteoarthritis of both hands 10/11/2015  . Trigger finger of both hands 10/11/2015   Social History   Tobacco Use  . Smoking status: Former Smoker    Years: 30.00    Types: Cigarettes    Quit date: 05/21/1994    Years since quitting: 25.2  . Smokeless tobacco: Never Used  Substance Use Topics  . Alcohol use: Yes    Comment: RARE    Current Outpatient Medications:  .  ALPRAZolam (XANAX) 1 MG tablet, Take 1 mg by mouth as needed. , Disp: , Rfl:  .  aspirin EC 81 MG tablet, Take 81 mg by mouth daily., Disp: , Rfl:  .  blood glucose meter kit and supplies KIT, Dispense based on patient and insurance preference. Use up to four times daily as directed. (FOR ICD-9 250.00, 250.01)., Disp: 1 each, Rfl: 0 .  budesonide-formoterol (SYMBICORT) 160-4.5 MCG/ACT inhaler, Inhale 2 puffs into the lungs 2 (two) times daily., Disp: 1 Inhaler, Rfl: 11 .  fenofibrate 160 MG tablet, Take 1 tablet (160 mg total) by mouth daily., Disp: 90 tablet, Rfl: 2 .  fluticasone (FLONASE) 50 MCG/ACT nasal spray, Place 2 sprays into both nostrils daily. , Disp: , Rfl:  .  furosemide (LASIX) 20 MG tablet, Take 1 tablet (20 mg total) by mouth daily., Disp: 30 tablet, Rfl: 3 .  gabapentin (NEURONTIN) 100 MG capsule, Take 1 capsule (100 mg total) by mouth 3 (three) times daily., Disp: 90 capsule, Rfl: 3 .  glucose blood test strip, Use as instructed, Disp: 100 each, Rfl: 12 .  guaiFENesin-codeine (CHERATUSSIN AC)  100-10 MG/5ML syrup, Take 10 mLs by mouth at bedtime as needed for cough or congestion., Disp: 120 mL, Rfl: 0 .  levothyroxine (SYNTHROID, LEVOTHROID) 25 MCG tablet, , Disp: , Rfl:  .  lisinopril (ZESTRIL) 40 MG tablet, Take 40 mg by mouth daily. , Disp: , Rfl:  .  magnesium oxide (MAG-OX) 400 MG tablet, Take 400 mg by mouth 2 (two) times daily., Disp: , Rfl:  .  Multiple Vitamin (MULTIVITAMIN WITH MINERALS) TABS tablet, Take 1  tablet by mouth daily., Disp: , Rfl:  .  olopatadine (PATANOL) 0.1 % ophthalmic solution, Place 1 drop into both eyes 2 (two) times daily., Disp: , Rfl:  .  Omega 3 1000 MG CAPS, Take 1,000 mg by mouth daily., Disp: , Rfl:  .  omeprazole (PRILOSEC) 20 MG capsule, Take 20 mg by mouth daily., Disp: , Rfl:  .  Polyethylene Glycol 3350 (MIRALAX PO), Take by mouth every evening., Disp: , Rfl:  .  potassium chloride (K-DUR) 10 MEQ tablet, Take 1 tablet (10 mEq total) by mouth daily., Disp: 30 tablet, Rfl: 2 .  Semaglutide, 1 MG/DOSE, (OZEMPIC, 1 MG/DOSE,) 2 MG/1.5ML SOPN, Inject 1 mg into the skin once a week., Disp: 1 pen, Rfl: 3 .  sitaGLIPtin-metformin (JANUMET) 50-500 MG tablet, TAKE 1 TABLET BY MOUTH TWICE DAILY WITH A MEAL, Disp: 60 tablet, Rfl: 4 .  ipratropium (ATROVENT) 0.03 % nasal spray, Place 2 sprays into both nostrils every 12 (twelve) hours., Disp: 30 mL, Rfl: 1 Allergies  Allergen Reactions  . Celebrex [Celecoxib] Anaphylaxis  . Glipizide Itching  . Penicillins Other (See Comments)    "Burn marks from inside out"   . Sulfa Antibiotics Itching    Assessment & Plan:   1. Moderate persistent asthma without complication; Nasal congestion Patient has a respiratory illness without signs of acute distress or respiratory compromise at this time. This is likely a viral infection, which can come from a number of respiratory viruses.  Refill symbicort. Will trial atrovent nasal spray. Close follow-up if symptoms do not improve or if they change.  Did discuss that with her sx I cannot r/o COVID-19. Continue to follow CDC recommendations -- washing hands, wearing masks, avoiding non essential places. Close follow-up if any changes in symptoms. She declines COVID-19 testing at this time.       No orders of the defined types were placed in this encounter.  Meds ordered this encounter  Medications  . budesonide-formoterol (SYMBICORT) 160-4.5 MCG/ACT inhaler    Sig: Inhale 2 puffs into  the lungs 2 (two) times daily.    Dispense:  1 Inhaler    Refill:  11    Order Specific Question:   Supervising Provider    Answer:   Juleen China, ERICA [[id number]]  . ipratropium (ATROVENT) 0.03 % nasal spray    Sig: Place 2 sprays into both nostrils every 12 (twelve) hours.    Dispense:  30 mL    Refill:  1    Order Specific Question:   Supervising Provider    Answer:   Juleen China, ERICA [place]    Inda Coke, [place] 09/01/2019  Time spent with the patient: 8 minutes, spent in obtaining information about her symptoms, reviewing her previous labs, evaluations, and treatments, counseling her about her condition (please see the discussed topics above), and developing a plan to further investigate it; she had a number of questions which I addressed.

## 2019-09-02 ENCOUNTER — Other Ambulatory Visit: Payer: Self-pay

## 2019-09-02 MED ORDER — GLUCOSE BLOOD VI STRP: ORAL_STRIP | 12 refills | Status: DC

## 2019-09-15 ENCOUNTER — Ambulatory Visit: Payer: Medicare Other | Admitting: Podiatry

## 2019-09-30 ENCOUNTER — Encounter: Payer: Self-pay | Admitting: Physician Assistant

## 2019-10-01 NOTE — Telephone Encounter (Signed)
Patient is scheduled for a med refill appt with Dr. Rogers Blocker

## 2019-10-01 NOTE — Telephone Encounter (Signed)
Please call pt and schedule TOC with Dr. Rogers Blocker.

## 2019-10-05 ENCOUNTER — Ambulatory Visit: Payer: Medicare Other | Admitting: Family Medicine

## 2019-10-07 ENCOUNTER — Encounter: Payer: Self-pay | Admitting: Physician Assistant

## 2019-10-08 NOTE — Telephone Encounter (Signed)
Please call pt and schedule with Dr. Rogers Blocker.

## 2019-10-14 ENCOUNTER — Ambulatory Visit (INDEPENDENT_AMBULATORY_CARE_PROVIDER_SITE_OTHER): Payer: Medicare Other | Admitting: Family Medicine

## 2019-10-14 ENCOUNTER — Other Ambulatory Visit: Payer: Self-pay

## 2019-10-14 ENCOUNTER — Encounter: Payer: Self-pay | Admitting: Family Medicine

## 2019-10-14 VITALS — BP 136/70 | HR 92 | Temp 96.5°F | Ht 64.0 in | Wt 173.2 lb

## 2019-10-14 DIAGNOSIS — E039 Hypothyroidism, unspecified: Secondary | ICD-10-CM | POA: Diagnosis not present

## 2019-10-14 DIAGNOSIS — M79604 Pain in right leg: Secondary | ICD-10-CM

## 2019-10-14 DIAGNOSIS — E1159 Type 2 diabetes mellitus with other circulatory complications: Secondary | ICD-10-CM | POA: Diagnosis not present

## 2019-10-14 DIAGNOSIS — E785 Hyperlipidemia, unspecified: Secondary | ICD-10-CM | POA: Diagnosis not present

## 2019-10-14 DIAGNOSIS — I10 Essential (primary) hypertension: Secondary | ICD-10-CM | POA: Diagnosis not present

## 2019-10-14 DIAGNOSIS — M79605 Pain in left leg: Secondary | ICD-10-CM

## 2019-10-14 DIAGNOSIS — K219 Gastro-esophageal reflux disease without esophagitis: Secondary | ICD-10-CM

## 2019-10-14 DIAGNOSIS — E1169 Type 2 diabetes mellitus with other specified complication: Secondary | ICD-10-CM | POA: Diagnosis not present

## 2019-10-14 DIAGNOSIS — Z8551 Personal history of malignant neoplasm of bladder: Secondary | ICD-10-CM | POA: Insufficient documentation

## 2019-10-14 DIAGNOSIS — Z1159 Encounter for screening for other viral diseases: Secondary | ICD-10-CM

## 2019-10-14 LAB — LIPID PANEL
Cholesterol: 212 mg/dL — ABNORMAL HIGH (ref 0–200)
HDL: 30 mg/dL — ABNORMAL LOW (ref >39.00)
NonHDL: 182.23
Total CHOL/HDL Ratio: 7
Triglycerides: 265 mg/dL — ABNORMAL HIGH (ref 0.0–149.0)
VLDL: 53 mg/dL — ABNORMAL HIGH (ref 0.0–40.0)

## 2019-10-14 LAB — CBC WITH DIFFERENTIAL/PLATELET
Basophils Absolute: 0.1 10*3/uL (ref 0.0–0.1)
Basophils Relative: 1.1 % (ref 0.0–3.0)
Eosinophils Absolute: 0.2 10*3/uL (ref 0.0–0.7)
Eosinophils Relative: 3.6 % (ref 0.0–5.0)
HCT: 39.5 % (ref 36.0–46.0)
Hemoglobin: 13 g/dL (ref 12.0–15.0)
Lymphocytes Relative: 33.1 % (ref 12.0–46.0)
Lymphs Abs: 2.1 10*3/uL (ref 0.7–4.0)
MCHC: 33 g/dL (ref 30.0–36.0)
MCV: 80.4 fl (ref 78.0–100.0)
Monocytes Absolute: 0.4 10*3/uL (ref 0.1–1.0)
Monocytes Relative: 5.6 % (ref 3.0–12.0)
Neutro Abs: 3.5 10*3/uL (ref 1.4–7.7)
Neutrophils Relative %: 56.6 % (ref 43.0–77.0)
Platelets: 323 10*3/uL (ref 150.0–400.0)
RBC: 4.91 Mil/uL (ref 3.87–5.11)
RDW: 14.4 % (ref 11.5–15.5)
WBC: 6.3 10*3/uL (ref 4.0–10.5)

## 2019-10-14 LAB — COMPREHENSIVE METABOLIC PANEL
ALT: 32 U/L (ref 0–35)
AST: 26 U/L (ref 0–37)
Albumin: 4.5 g/dL (ref 3.5–5.2)
Alkaline Phosphatase: 52 U/L (ref 39–117)
BUN: 15 mg/dL (ref 6–23)
CO2: 28 mEq/L (ref 19–32)
Calcium: 10 mg/dL (ref 8.4–10.5)
Chloride: 103 mEq/L (ref 96–112)
Creatinine, Ser: 0.83 mg/dL (ref 0.40–1.20)
GFR: 67.19 mL/min (ref >60.00)
Glucose, Bld: 187 mg/dL — ABNORMAL HIGH (ref 70–99)
Potassium: 3.9 mEq/L (ref 3.5–5.1)
Sodium: 141 mEq/L (ref 135–145)
Total Bilirubin: 0.4 mg/dL (ref 0.2–1.2)
Total Protein: 7.4 g/dL (ref 6.0–8.3)

## 2019-10-14 LAB — MICROALBUMIN / CREATININE URINE RATIO
Creatinine,U: 62.4 mg/dL
Microalb Creat Ratio: 1.6 mg/g (ref 0.0–30.0)
Microalb, Ur: 1 mg/dL (ref 0.0–1.9)

## 2019-10-14 LAB — HEMOGLOBIN A1C: Hgb A1c MFr Bld: 7.6 % — ABNORMAL HIGH (ref 4.6–6.5)

## 2019-10-14 LAB — LDL CHOLESTEROL, DIRECT: Direct LDL: 144 mg/dL

## 2019-10-14 LAB — TSH: TSH: 1.9 u[IU]/mL (ref 0.35–4.50)

## 2019-10-14 LAB — T4, FREE: Free T4: 0.93 ng/dL (ref 0.60–1.60)

## 2019-10-14 MED ORDER — FENOFIBRATE 160 MG PO TABS: 160.0000 mg | ORAL_TABLET | Freq: Every day | ORAL | 2 refills | Status: DC

## 2019-10-14 MED ORDER — OMEPRAZOLE 20 MG PO CPDR: 20.0000 mg | DELAYED_RELEASE_CAPSULE | Freq: Every day | ORAL | 1 refills | Status: DC

## 2019-10-14 MED ORDER — BACLOFEN 10 MG PO TABS: 10.0000 mg | ORAL_TABLET | Freq: Three times a day (TID) | ORAL | 1 refills | Status: DC

## 2019-10-14 MED ORDER — LEVOTHYROXINE SODIUM 25 MCG PO TABS: 25.0000 ug | ORAL_TABLET | Freq: Every day | ORAL | 1 refills | Status: DC

## 2019-10-14 MED ORDER — JANUMET 50-1000 MG PO TABS: 1.0000 | ORAL_TABLET | Freq: Two times a day (BID) | ORAL | 1 refills | Status: DC

## 2019-10-14 MED ORDER — TRULICITY 1.5 MG/0.5ML ~~LOC~~ SOAJ: 1.5000 mg | SUBCUTANEOUS | 1 refills | Status: DC

## 2019-10-14 MED ORDER — LISINOPRIL 40 MG PO TABS: 40.0000 mg | ORAL_TABLET | Freq: Every day | ORAL | 1 refills | Status: DC

## 2019-10-14 NOTE — Progress Notes (Signed)
Patient: Teresa Rice MRN: [id number] DOB: 07-May-1946 PCP: Orma Flaming, MD     Subjective:  Chief Complaint  Patient presents with  . Medication Refill    HPI: The patient is a 73 y.o. female who presents today for med refills and transfer of care.   Hypertension: Here for follow up of hypertension.  Currently on lisinopril .  Takes medication as prescribed and denies any side effects. Exercise includes walking. Weight has been stable. Denies any chest pain, headaches, shortness of breath, vision changes, swelling in lower extremities.   Diabetes: Patient is here for follow up of type 2 diabetes. First diagnosed 67.  Currently on the following medications janumet and ozempic. Takes medications as prescribed. Last A1C was 7.8. Currently exercising and following diabetic diet. Sugars range from 156 to 160+. Denies any hypoglycemic events. Denies any vision changes, nausea, vomiting, abdominal pain, ulcers/paraesthesia in feet, polyuria, polydipsia or polyphagia. Denies any chest pain, shortness of breath. She does not like ozempic as it causes her to  Bruise and hurts. She would like to go back to trulicity. Also on janumet at 50-500 dosage. Was on a higher dose of metformin before, unsure why not on the same dose.   Hyperlipidemia: was on statin, but then stopped by PCP. She thought she was having leg cramps, but it was not from medication. She was put on fenofibrate after crestor was stopped. She has HTN/diabetes. Unsure of family hx of CAD. She has past hx of smoking (stopped over 20 years ago).    Leg pain: she was started on gabapentin for leg pain. She is not sure if this helps. Saw neurology EMG was negative. History not consistent with neuropathy either. Cant' see if Abi's have been done, but she said they told her it was normal. She has more cramping. No tingling/burning/pain.   Review of Systems  Constitutional: Negative for fatigue.  HENT: Positive for congestion. Negative  for postnasal drip, rhinorrhea and sore throat.   Eyes: Negative for visual disturbance.  Respiratory: Negative for shortness of breath.   Cardiovascular: Negative for chest pain, palpitations and leg swelling.  Gastrointestinal: Negative for abdominal pain, diarrhea, nausea and vomiting.  Endocrine: Negative for cold intolerance, heat intolerance, polydipsia and polyuria.  Skin: Negative for rash.  Neurological: Negative for dizziness and headaches.  Psychiatric/Behavioral: Negative for sleep disturbance.    Allergies Patient is allergic to celebrex [celecoxib]; glipizide; penicillins; and sulfa antibiotics.  Past Medical History Patient  has a past medical history of Bladder cancer ([place]) (first dx 2012), Chronic constipation, Full dentures, GERD (gastroesophageal reflux disease), Hyperlipidemia, Hypertension, Mild intermittent asthma, OA (osteoarthritis), Trigger finger of both hands, and Type 2 diabetes mellitus ([place]).  Surgical History Patient  has a past surgical history that includes Bladder surgery (2012   in [facility]); Cholecystectomy (1970); Transurethral resection of bladder tumor (N/A, 05/24/2014); Cystoscopy/retrograde/ureteroscopy (Left, 05/24/2014); and Transurethral resection of bladder tumor (N/A, 08/04/2015).  Family History Pateint's family history includes AAA (abdominal aortic aneurysm) in her sister; Cancer in her brother and father; Diabetes in her brother; Early death in her mother.  Social History Patient  reports that she quit smoking about 25 years ago. Her smoking use included cigarettes. She quit after 30.00 years of use. She has never used smokeless tobacco. She reports current alcohol use. She reports that she does not use drugs.    Objective: Vitals:   10/14/19 1115  BP: 136/70  Pulse: 92  Temp: (!) 96.5 F (35.8 C)  TempSrc: Skin  SpO2: 97%  Weight: 173 lb 3.2 oz (78.6 kg)  Height: 5\' 4"  (1.626 m)    Body mass index is 29.73 kg/m.  Physical  Exam Vitals signs reviewed.  Constitutional:      Appearance: Normal appearance. She is well-developed.  HENT:     Head: Normocephalic and atraumatic.     Right Ear: Tympanic membrane, ear canal and external ear normal.     Left Ear: Ear canal and external ear normal.     Nose: Nose normal.     Mouth/Throat:     Mouth: Mucous membranes are moist.  Eyes:     Extraocular Movements: Extraocular movements intact.     Conjunctiva/sclera: Conjunctivae normal.     Pupils: Pupils are equal, round, and reactive to light.  Neck:     Musculoskeletal: Normal range of motion and neck supple.     Thyroid: No thyromegaly.  Cardiovascular:     Rate and Rhythm: Normal rate and regular rhythm.     Heart sounds: Normal heart sounds. No murmur.  Pulmonary:     Effort: Pulmonary effort is normal.     Breath sounds: Normal breath sounds.  Abdominal:     General: Abdomen is flat. Bowel sounds are normal. There is no distension.     Palpations: Abdomen is soft.     Tenderness: There is no abdominal tenderness.  Musculoskeletal:     Right lower leg: No edema.     Left lower leg: No edema.  Lymphadenopathy:     Cervical: No cervical adenopathy.  Skin:    General: Skin is warm and dry.     Findings: No rash.  Neurological:     General: No focal deficit present.     Mental Status: She is alert and oriented to person, place, and time.     Cranial Nerves: No cranial nerve deficit.     Coordination: Coordination normal.     Deep Tendon Reflexes: Reflexes normal.  Psychiatric:        Mood and Affect: Mood normal.        Behavior: Behavior normal.      Office Visit from 10/14/2019 in [facility]  PHQ-2 Total Score  0         Assessment/plan: 1. Type 2 diabetes mellitus with other specified complication, without long-term current use of insulin (HCC) Will change her back to trulicity since tolerated this much better. Increasing her janumet to 50-1000mg  to increase  metformin. GFR/creatinine wnl. Starting back on statin. Foot exam next appointment. F/u in 3 months.  - Hemoglobin A1c - CBC with Differential/Platelet - Comprehensive metabolic panel - Microalbumin / creatinine urine ratio  2. Gastroesophageal reflux disease  - omeprazole (PRILOSEC) 20 MG capsule; Take 1 capsule (20 mg total) by mouth daily.  Dispense: 90 capsule; Refill: 1  3. Hypertension associated with diabetes ([place]) Blood pressure is to goal. Continue current anti-hypertensive medications. Refills given and routine lab work will be done today. Recommended routine exercise and healthy diet including DASH diet and mediterranean diet. Encouraged weight loss. F/u in 6 months.    4. Acquired hypothyroidism, on Levothyoxine 25 mcg daily  - TSH - T4, free  5. Hyperlipidemia associated with type 2 diabetes mellitus ([place]) Starting back statin with diabetes and stopping fenofibrate. Checking lipid panel now and then in 3 months. Unsure what her TG were before and discussed need for statin therapy. Does not need refills at this time.  - Lipid panel  6. Encounter for  hepatitis C screening test for low risk patient  - Hepatitis C antibody  7. Leg pain  -emg study normal, no hx consistent with neuropathy and gabapentin does not work. Stopping this. Will do trial of baclofen.    Return in about 3 months (around 01/14/2020) for labs/appointment .   Orma Flaming, MD [place]   10/14/2019

## 2019-10-14 NOTE — Patient Instructions (Signed)
-  we are stopping your lasix and gabapentin.   -adding baclofen to take up three times a day or as needed for muscle cramping  -stopping ozempic and starting back trulicity  -increasing your janumet to 50-1000mg  BID so we can increase the metformin.   -stopping your fenofibrate and adding back crestor 10mg  nightly.   See you back in 3 months for repeat labs/appointment.   Very nice to meet you! Happy thanksgiving! Dr. Rogers Blocker

## 2019-10-15 LAB — HEPATITIS C ANTIBODY
Hepatitis C Ab: NONREACTIVE
SIGNAL TO CUT-OFF: 0.02 (ref <1.00)

## 2019-10-16 ENCOUNTER — Telehealth: Payer: Self-pay | Admitting: Family Medicine

## 2019-10-16 NOTE — Telephone Encounter (Signed)
Patient is calling back for lab results. Please advise. Thank you. CB- [phone]

## 2019-10-19 ENCOUNTER — Telehealth: Payer: Self-pay | Admitting: *Deleted

## 2019-10-19 NOTE — Telephone Encounter (Signed)
Left a vm for patient to callback for lab result  

## 2019-10-30 ENCOUNTER — Other Ambulatory Visit: Payer: Self-pay | Admitting: Family Medicine

## 2019-10-30 DIAGNOSIS — R600 Localized edema: Secondary | ICD-10-CM

## 2019-11-02 ENCOUNTER — Encounter: Payer: Self-pay | Admitting: Neurology

## 2019-11-09 ENCOUNTER — Ambulatory Visit: Payer: Medicare Other | Admitting: Neurology

## 2019-11-21 ENCOUNTER — Encounter: Payer: Self-pay | Admitting: Family Medicine

## 2019-11-23 ENCOUNTER — Other Ambulatory Visit: Payer: Self-pay

## 2019-11-23 ENCOUNTER — Ambulatory Visit (INDEPENDENT_AMBULATORY_CARE_PROVIDER_SITE_OTHER): Payer: Medicare Other | Admitting: Family Medicine

## 2019-11-23 ENCOUNTER — Encounter: Payer: Self-pay | Admitting: Family Medicine

## 2019-11-23 ENCOUNTER — Other Ambulatory Visit: Payer: Self-pay | Admitting: Physician Assistant

## 2019-11-23 VITALS — Ht 64.0 in | Wt 173.0 lb

## 2019-11-23 DIAGNOSIS — J0100 Acute maxillary sinusitis, unspecified: Secondary | ICD-10-CM | POA: Diagnosis not present

## 2019-11-23 MED ORDER — DOXYCYCLINE HYCLATE 100 MG PO TABS: 100.0000 mg | ORAL_TABLET | Freq: Two times a day (BID) | ORAL | 0 refills | Status: DC

## 2019-11-23 MED ORDER — BUDESONIDE-FORMOTEROL FUMARATE 160-4.5 MCG/ACT IN AERO: 2.0000 | INHALATION_SPRAY | Freq: Two times a day (BID) | RESPIRATORY_TRACT | 4 refills | Status: DC

## 2019-11-23 NOTE — Patient Instructions (Signed)

## 2019-11-23 NOTE — Progress Notes (Signed)
Patient: Teresa Rice MRN: [id number] DOB: 1946/04/14 PCP: Orma Flaming, MD     I connected with Teresa Rice on 11/23/19 at 11:50am by a video enabled telemedicine application and verified that I am speaking with the correct person using two identifiers.  Location patient: Home Location provider: [facility], Office Persons participating in this virtual visit: Teresa Rice and Dr. Rogers Blocker   I discussed the limitations of evaluation and management by telemedicine and the availability of in person appointments. The patient expressed understanding and agreed to proceed.   Subjective:  Chief Complaint  Patient presents with  . Sinusitis    HPI: The patient is a 73 y.o. female who presents today for possible sinus infection. Her symtpoms started before christmas. She has pain and pressure in her sinuses, headache, and severe nasal congestion. When she blows her nose she is getting yellow/green mucous. She denies any fever/chills, ear pain, sore throat. She does not have a cough or shortness of breath. She has false teeth so states her teeth don't hurt. Denies any sick contacts or covid exposure. Uses PPE when out. She has used flonase. Does have a history of asthma, but this is controlled at this time. No loss of taste or smell.   Needs a refill of her symbicort as well.   Review of Systems  Constitutional: Negative for chills, diaphoresis and fever.  HENT: Positive for congestion, postnasal drip, rhinorrhea, sinus pressure and sinus pain. Negative for ear pain, sore throat and trouble swallowing.   Eyes: Negative for photophobia and pain.  Respiratory: Negative for cough, shortness of breath and wheezing.   Cardiovascular: Negative for chest pain and palpitations.  Gastrointestinal: Negative for abdominal pain, diarrhea, nausea and vomiting.  Musculoskeletal: Negative for back pain, neck pain and neck stiffness.  Neurological: Positive for headaches. Negative for dizziness.     Allergies Patient is allergic to celebrex [celecoxib]; glipizide; penicillins; and sulfa antibiotics.  Past Medical History Patient  has a past medical history of Bladder cancer ([place]) (first dx 2012), Chronic constipation, Full dentures, GERD (gastroesophageal reflux disease), Hyperlipidemia, Hypertension, Mild intermittent asthma, OA (osteoarthritis), Trigger finger of both hands, and Type 2 diabetes mellitus ([place]).  Surgical History Patient  has a past surgical history that includes Bladder surgery (2012   in [facility]); Cholecystectomy (1970); Transurethral resection of bladder tumor (N/A, 05/24/2014); Cystoscopy/retrograde/ureteroscopy (Left, 05/24/2014); and Transurethral resection of bladder tumor (N/A, 08/04/2015).  Family History Pateint's family history includes AAA (abdominal aortic aneurysm) in her sister; Cancer in her brother and father; Diabetes in her brother; Early death in her mother.  Social History Patient  reports that she quit smoking about 25 years ago. Her smoking use included cigarettes. She quit after 30.00 years of use. She has never used smokeless tobacco. She reports current alcohol use. She reports that she does not use drugs.    Objective: Vitals:   11/23/19 1132  Weight: 78.5 kg  Height: 5\' 4"  (1.626 m)    Body mass index is 29.7 kg/m.  Physical Exam Vitals reviewed.  Constitutional:      Appearance: Normal appearance. She is normal weight.  HENT:     Head: Normocephalic and atraumatic.     Comments: She has TTP over her sinuses when she palpates.     Nose: Congestion present.  Pulmonary:     Effort: Pulmonary effort is normal. No respiratory distress.     Breath sounds: No stridor.  Neurological:     General: No focal deficit present.  Mental Status: She is alert and oriented to person, place, and time.  Psychiatric:        Mood and Affect: Mood normal.        Behavior: Behavior normal.        Assessment/plan: 1. Acute non-recurrent  maxillary sinusitis Course of doxycycline since allergy to pcn. Continue flonase daily and recommended a cool mist humidifier at night. Precautions given. Let me know if pressure/congestion not getting better and can consider steroids, but would like to hold off at this time with hx of diabetes.   Refilled symbicort.     Return if symptoms worsen or fail to improve.  Records requested if needed. Time spent with patient: 15 minutes, of which >50% was spent in obtaining information about her symptoms, reviweing her previous labs, evaluations, and treatments, counseling her about her conditions (please see discussed topics above), and developing a plan to further investigate it; she had a number of questions which I addressed.    Orma Flaming, MD [place]  11/23/2019

## 2019-11-25 ENCOUNTER — Encounter: Payer: Self-pay | Admitting: Psychiatry

## 2019-11-25 ENCOUNTER — Ambulatory Visit (INDEPENDENT_AMBULATORY_CARE_PROVIDER_SITE_OTHER): Payer: Medicare Other | Admitting: Psychiatry

## 2019-11-25 ENCOUNTER — Other Ambulatory Visit: Payer: Self-pay

## 2019-11-25 DIAGNOSIS — F5105 Insomnia due to other mental disorder: Secondary | ICD-10-CM | POA: Diagnosis not present

## 2019-11-25 DIAGNOSIS — G4721 Circadian rhythm sleep disorder, delayed sleep phase type: Secondary | ICD-10-CM

## 2019-11-25 DIAGNOSIS — F4001 Agoraphobia with panic disorder: Secondary | ICD-10-CM

## 2019-11-25 DIAGNOSIS — F4310 Post-traumatic stress disorder, unspecified: Secondary | ICD-10-CM | POA: Diagnosis not present

## 2019-11-25 MED ORDER — ALPRAZOLAM 1 MG PO TABS: 1.0000 mg | ORAL_TABLET | ORAL | 5 refills | Status: DC | PRN

## 2019-11-25 NOTE — Progress Notes (Signed)
Teresa Rice [id number] [birth year] 73 y.o.  Subjective:   Patient ID:  Teresa Rice is a 73 y.o. (DOB 04/13/1946) female.  Chief Complaint:  Chief Complaint  Patient presents with  . Follow-up    Medication Management  . Post-Traumatic Stress Disorder    Medication Management  . Anxiety    HPI Teresa Rice presents to the office today for follow-up of anxiety.    Last seen June 2020 without med changes though she had previously stopped SSRI AMA.  MD had concerns over LT panic,  Good Xmas with GS coming from [place].  A lot of fun.   Baby sit 81 mos old GGS several days a week.  Keeps her busy and she enjoys it.  Keeps her mind off things that might worry her like the virus. Good except for recent sinus infections  Get down a little from Covid isolation.  New GGS Teresa Rice mos old.  Sleep better.  Couple panic since here over social matters.  Doing ok with Xanax. Consistent and it works.  1 year anniversary of B's death.  Affected her mood and caused anxiety.  Business has helped anxiety and sleep generally..  No caffeine.  Not aware if something bothering her.  No full panic.  Not drowsy daytime. .  Also falling asleep on the couch though she doesn't want to do it.  No weight loss off Paxil but no weight gain either.  Overall is not worse still and pleased with that.  Still doing well with the Xanax. Usually 1/2 mg TID and 1 mg HS.   New PCP Teresa Rice, [place]  Past Psychiatric Medication Trials:  Paxil 60, trazodone, sertraline forgetfulness She has been under our care since 1998 and has been on the same dosage of Xanax the whole time and most of the time took paroxetine 60   Review of Systems:  Review of Systems  HENT: Positive for hearing loss and voice change.   Musculoskeletal: Positive for back pain.  Neurological: Negative for tremors and weakness.  Psychiatric/Behavioral: Negative for agitation, behavioral problems, confusion, decreased concentration,  dysphoric mood, hallucinations, self-injury, sleep disturbance and suicidal ideas. The patient is nervous/anxious. The patient is not hyperactive.     Medications: I have reviewed the patient's current medications.  Current Outpatient Medications  Medication Sig Dispense Refill  . Accu-Chek Softclix Lancets lancets     . ALPRAZolam (XANAX) 1 MG tablet Take 1 mg by mouth as needed.     Marland Kitchen aspirin EC 81 MG tablet Take 81 mg by mouth daily.    . blood glucose meter kit and supplies KIT Dispense based on patient and insurance preference. Use up to four times daily as directed. (FOR ICD-9 250.00, 250.01). 1 each 0  . Blood Glucose Monitoring Suppl (ACCU-CHEK AVIVA PLUS) w/Device KIT     . budesonide-formoterol (SYMBICORT) 160-4.5 MCG/ACT inhaler Inhale 2 puffs into the lungs 2 (two) times daily. Rice Inhaler 4  . doxycycline (VIBRA-TABS) 100 MG tablet Take 1 tablet (100 mg total) by mouth 2 (two) times daily. 20 tablet 0  . Dulaglutide (TRULICITY) 1.5 KY/7.0WC SOPN Inject 1.5 mg into the skin once a week. 12 pen 1  . fluticasone (FLONASE) 50 MCG/ACT nasal spray Place 2 sprays into both nostrils daily.     Marland Kitchen gabapentin (NEURONTIN) 100 MG capsule Take 1 capsule (100 mg total) by mouth Rice (three) times daily. 90 capsule Rice  . glucose blood test strip Use as instructed 100 each 12  .  ipratropium (ATROVENT) 0.03 % nasal spray USE 2 SPRAYS IN EACH NOSTRIL EVERY 12 HOURS 30 mL 1  . levothyroxine (SYNTHROID) 25 MCG tablet Take 1 tablet (25 mcg total) by mouth daily before breakfast. 90 tablet 1  . lisinopril (ZESTRIL) 40 MG tablet Take 1 tablet (40 mg total) by mouth daily. 90 tablet 1  . magnesium oxide (MAG-OX) 400 MG tablet Take 400 mg by mouth 2 (two) times daily.    . Multiple Vitamin (MULTIVITAMIN WITH MINERALS) TABS tablet Take 1 tablet by mouth daily.    . Omega Rice 1000 MG CAPS Take 1,000 mg by mouth daily.    Marland Kitchen omeprazole (PRILOSEC) 20 MG capsule Take 1 capsule (20 mg total) by mouth daily. 90 capsule 1   . Polyethylene Glycol 3350 (MIRALAX PO) Take by mouth every evening.    . potassium chloride (KLOR-CON) 10 MEQ tablet TAKE 1 TABLET(10 MEQ) BY MOUTH DAILY 30 tablet 2  . rosuvastatin (CRESTOR) 10 MG tablet Take 1 tablet (10 mg total) by mouth daily. 90 tablet 1  . sitaGLIPtin-metformin (JANUMET) 50-1000 MG tablet Take 1 tablet by mouth 2 (two) times daily with a meal. 180 tablet 1   No current facility-administered medications for this visit.    Medication Side Effects: None  Allergies:  Allergies  Allergen Reactions  . Celebrex [Celecoxib] Anaphylaxis  . Glipizide Itching  . Penicillins Other (See Comments)    "Burn marks from inside out"   . Sulfa Antibiotics Itching    Past Medical History:  Diagnosis Date  . Bladder cancer [facility]) first dx 2012   Recurrent Bladder Cancer--  (urologist-  dr Teresa Rice)  . Chronic constipation   . Full dentures   . GERD (gastroesophageal reflux disease)   . Hyperlipidemia   . Hypertension   . Mild intermittent asthma   . OA (osteoarthritis)    fingers   . Trigger finger of both hands    wear slints and special gloves at night  . Type 2 diabetes mellitus (HCC)     Family History  Problem Relation Age of Onset  . Early death Mother   . AAA (abdominal aortic aneurysm) Sister   . Cancer Father   . Diabetes Brother   . Cancer Brother     Social History   Socioeconomic History  . Marital status: Widowed    Spouse name: Not on file  . Number of children: 1  . Years of education: 80  . Highest education level: Not on file  Occupational History  . Occupation: retired  Tobacco Use  . Smoking status: Former Smoker    Years: 30.00    Types: Cigarettes    Quit date: 05/21/1994    Years since quitting: 25.5  . Smokeless tobacco: Never Used  Substance and Sexual Activity  . Alcohol use: Yes    Comment: RARE  . Drug use: No  . Sexual activity: Not on file  Other Topics Concern  . Not on file  Social History Narrative   Lives in  an apartment, lives alone   One daughter   Right handed    Retired from [employer] work   [employer] level of education:  GED   Social Determinants of Radio broadcast assistant Strain:   . Difficulty of Paying Living Expenses: Not on file  Food Insecurity:   . Worried About Charity fundraiser in the Last Year: Not on file  . Ran Out of Food in the Last Year: Not on file  Transportation Needs:   . Film/video editor (Medical): Not on file  . Lack of Transportation (Non-Medical): Not on file  Physical Activity: Insufficiently Active  . Days of Exercise per Week: 6 days  . Minutes of Exercise per Session: 20 min  Stress:   . Feeling of Stress : Not on file  Social Connections:   . Frequency of Communication with Friends and Family: Not on file  . Frequency of Social Gatherings with Friends and Family: Not on file  . Attends Religious Services: Not on file  . Active Member of Clubs or Organizations: Not on file  . Attends Archivist Meetings: Not on file  . Marital Status: Not on file  Intimate Partner Violence:   . Fear of Current or Ex-Partner: Not on file  . Emotionally Abused: Not on file  . Physically Abused: Not on file  . Sexually Abused: Not on file    Past Medical History, Surgical history, Social history, and Family history were reviewed and updated as appropriate.   2 new hearing aids.  Please see review of systems for further details on the patient's review from today.   Objective:   Physical Exam:  LMP  (LMP Unknown)   Physical Exam Constitutional:      General: She is not in acute distress.    Appearance: She is well-developed.  Musculoskeletal:        General: No deformity.  Neurological:     Mental Status: She is alert and oriented to person, place, and time.     Motor: No tremor.     Coordination: Coordination normal.     Gait: Gait normal.  Psychiatric:        Attention and Perception: Attention normal. She does not perceive auditory  hallucinations.        Mood and Affect: Mood is anxious. Mood is not depressed. Affect is not labile, blunt, angry or inappropriate.        Speech: Speech normal.        Behavior: Behavior normal.        Thought Content: Thought content normal. Thought content does not include homicidal or suicidal ideation. Thought content does not include homicidal or suicidal plan.        Cognition and Memory: Cognition normal.        Judgment: Judgment normal.     Comments: Insight fair.  Lacking in some areas about sleep expectations. No auditory or visual hallucinations. No delusions.      Lab Review:     Component Value Date/Time   NA 141 10/14/2019 1204   K Rice.9 10/14/2019 1204   CL 103 10/14/2019 1204   CO2 28 10/14/2019 1204   GLUCOSE 187 (H) 10/14/2019 1204   BUN 15 10/14/2019 1204   CREATININE 0.83 10/14/2019 1204   CALCIUM 10.0 10/14/2019 1204   PROT 7.4 10/14/2019 1204   ALBUMIN 4.5 10/14/2019 1204   AST 26 10/14/2019 1204   ALT 32 10/14/2019 1204   ALKPHOS 52 10/14/2019 1204   BILITOT 0.4 10/14/2019 1204   GFRNONAA 87 (L) 05/24/2014 0620   GFRAA >90 05/24/2014 0620       Component Value Date/Time   WBC 6.Rice 10/14/2019 1204   RBC 4.91 10/14/2019 1204   HGB 13.0 10/14/2019 1204   HCT 39.5 10/14/2019 1204   PLT 323.0 10/14/2019 1204   MCV 80.4 10/14/2019 1204   MCH 26.2 05/24/2014 0620   MCHC 33.0 10/14/2019 1204   RDW 14.4 10/14/2019 1204  LYMPHSABS 2.1 10/14/2019 1204   MONOABS 0.4 10/14/2019 1204   EOSABS 0.2 10/14/2019 1204   BASOSABS 0.1 10/14/2019 1204    No results found for: POCLITH, LITHIUM   No results found for: PHENYTOIN, PHENOBARB, VALPROATE, CBMZ   .res Assessment: Plan:    Karinna was seen today for follow-up, post-traumatic stress disorder and anxiety.  Diagnoses and all orders for this visit:  Panic disorder with agoraphobia  PTSD (post-traumatic stress disorder)  Insomnia due to mental condition  Delayed sleep phase syndrome   Overall  doing well as she can.  Chronic avoidance and general fearfulness at baseline.  Sleep hygiene in detail.  Normalized some of the problem.  She's getting enough sleep.  Delayed sleep phase is improved from last visit.  Insomnia is better.    Rec exercise.  Went off SSRI AMA DT weight gain.  Has done ok off it but no weight loss.  No med changes indicated.  Does not think she can tolerate anxiety without Xanax.  A lot of benefit and no SE.  We discussed the short-term risks associated with benzodiazepines including sedation and increased fall risk among others.  Discussed long-term side effect risk including dependence, potential withdrawal symptoms, and the potential eventual dose-related risk of dementia.  Satisfied with Xanax now..  Already at significant dose for age.  She has kept benefit.  FU 6 mos.  Lynder Parents, MD, DFAPA   Please see After Visit Summary for patient specific instructions.  Future Appointments  Date Time Provider [place]  01/15/2020  1:20 PM Orma Flaming, MD [facility]    No orders of the defined types were placed in this encounter.     -------------------------------

## 2019-11-30 ENCOUNTER — Encounter: Payer: Self-pay | Admitting: Family Medicine

## 2019-11-30 ENCOUNTER — Other Ambulatory Visit: Payer: Self-pay | Admitting: Family Medicine

## 2019-11-30 NOTE — Progress Notes (Signed)
Left vm message requesting a c/b

## 2019-11-30 NOTE — Progress Notes (Signed)
Delsa Sale, can you let her know I have refill requests for her for the furosemide and baclofen... I don't have these drugs in her chart anymore.. did someone else stop them?  Thanks,  Dr. Rogers Blocker

## 2019-12-01 ENCOUNTER — Encounter: Payer: Self-pay | Admitting: Family Medicine

## 2019-12-02 MED ORDER — FUROSEMIDE 20 MG PO TABS: 20.0000 mg | ORAL_TABLET | Freq: Every day | ORAL | 0 refills | Status: DC

## 2019-12-02 NOTE — Addendum Note (Signed)
Addended by: Orma Flaming on: 12/02/2019 12:22 PM   Modules accepted: Orders

## 2019-12-02 NOTE — Progress Notes (Signed)
Left vm message requesting a c/b from patient.

## 2019-12-02 NOTE — Progress Notes (Signed)
Spoke to patient and she states that she is still experiencing swelling in her legs and feels that she would benefit from resuming her Lasix.    Ok to send in refill?

## 2019-12-02 NOTE — Progress Notes (Signed)
Spoke to patient and advised of notes per Dr. Rogers Blocker.  Patient verbalized understanding

## 2019-12-02 NOTE — Progress Notes (Signed)
Please let her know that I do not like using this and prefer to do compression hose, leg elevation as it's not really safe to just use lasix. I sent in 30 pills for her to use. If she does use this daily we need to have her come in for a potassium check as it causes low potassium.  Thanks, dr. Rogers Blocker

## 2019-12-04 ENCOUNTER — Telehealth: Payer: Self-pay

## 2019-12-04 NOTE — Telephone Encounter (Signed)
Spoke with patient who states that she has been having increased swelling in her legs esp after watching her young grandchild.  She states that the furosemide helps a lot with the swelling of her legs.  Advised per Dr. Rogers Blocker that she should not be relying solely on the furosemide for the leg swelling.  Should be elevating legs as well as using compression stockings.  Patient verbalized understanding and understands that we will send in 30 pills of furosemide.  We also received a refill request for baclofen 10 mg, patient states that she does not need refill for this medication and that she does want to continue taking it.

## 2019-12-29 ENCOUNTER — Other Ambulatory Visit: Payer: Self-pay

## 2019-12-29 MED ORDER — FUROSEMIDE 20 MG PO TABS: 20.0000 mg | ORAL_TABLET | Freq: Every day | ORAL | 0 refills | Status: DC

## 2020-01-13 ENCOUNTER — Encounter: Payer: Self-pay | Admitting: Family Medicine

## 2020-01-15 ENCOUNTER — Ambulatory Visit: Payer: Medicare Other | Admitting: Family Medicine

## 2020-01-21 ENCOUNTER — Other Ambulatory Visit: Payer: Self-pay

## 2020-01-21 ENCOUNTER — Telehealth: Payer: Self-pay

## 2020-01-21 ENCOUNTER — Ambulatory Visit (INDEPENDENT_AMBULATORY_CARE_PROVIDER_SITE_OTHER): Payer: Medicare Other | Admitting: Family Medicine

## 2020-01-21 ENCOUNTER — Encounter: Payer: Self-pay | Admitting: Family Medicine

## 2020-01-21 ENCOUNTER — Ambulatory Visit (INDEPENDENT_AMBULATORY_CARE_PROVIDER_SITE_OTHER): Payer: Medicare Other

## 2020-01-21 VITALS — BP 142/88 | HR 96 | Temp 96.3°F | Ht 64.0 in | Wt 168.4 lb

## 2020-01-21 DIAGNOSIS — S8991XA Unspecified injury of right lower leg, initial encounter: Secondary | ICD-10-CM | POA: Diagnosis not present

## 2020-01-21 DIAGNOSIS — S8011XA Contusion of right lower leg, initial encounter: Secondary | ICD-10-CM

## 2020-01-21 DIAGNOSIS — M25561 Pain in right knee: Secondary | ICD-10-CM | POA: Diagnosis not present

## 2020-01-21 MED ORDER — TRAMADOL HCL 50 MG PO TABS: 50.0000 mg | ORAL_TABLET | Freq: Three times a day (TID) | ORAL | 0 refills | Status: AC | PRN

## 2020-01-21 NOTE — Telephone Encounter (Signed)
Pt is scheduled for 1:40pm.  Thank You

## 2020-01-21 NOTE — Patient Instructions (Addendum)
Try voltaren gel over the counter to put on area that is tender Heating pad as needed Tylenol prn Tramadol for severe pain  -ER if calf starts to swell, redness or pain in calf or increasing pain in thigh.   Contusion A contusion is a deep bruise. This is a result of an injury that causes bleeding under the skin. Symptoms of bruising include pain, swelling, and discolored skin. The skin may turn blue, purple, or yellow. Follow these instructions at home: Managing pain, stiffness, and swelling You may use RICE. This stands for:  Resting.  Icing.  Compression, or putting pressure.  Elevating, or raising the injured area. To follow this method, do these actions:  Rest the injured area.  If told, put ice on the injured area. ? Put ice in a plastic bag. ? Place a towel between your skin and the bag. ? Leave the ice on for 20 minutes, 2-3 times per day.  If told, put light pressure (compression) on the injured area using an elastic bandage. Make sure the bandage is not too tight. If the area tingles or becomes numb, remove it and put it back on as told by your doctor.  If possible, raise (elevate) the injured area above the level of your heart while you are sitting or lying down.  General instructions  Take over-the-counter and prescription medicines only as told by your doctor.  Keep all follow-up visits as told by your doctor. This is important. Contact a doctor if:  Your symptoms do not get better after several days of treatment.  Your symptoms get worse.  You have trouble moving the injured area. Get help right away if:  You have very bad pain.  You have a loss of feeling (numbness) in a hand or foot.  Your hand or foot turns pale or cold. Summary  A contusion is a deep bruise. This is a result of an injury that causes bleeding under the skin.  Symptoms of bruising include pain, swelling, and discolored skin. The skin may turn blue, purple, or yellow.  This  condition is treated with rest, ice, compression, and elevation. This is also called RICE. You may be given over-the-counter medicines for pain.  Contact a doctor if you do not feel better, or you feel worse. Get help right away if you have very bad pain, have lost feeling in a hand or foot, or the area turns pale or cold. This information is not intended to replace advice given to you by your health care provider. Make sure you discuss any questions you have with your health care provider. Document Revised: 07/04/2018 Document Reviewed: 07/04/2018 Elsevier Patient Education  [place].

## 2020-01-21 NOTE — Progress Notes (Signed)
Patient: Teresa Rice MRN: [id number] DOB: 28-Apr-1946 PCP: Orma Flaming, MD     Subjective:  Chief Complaint  Patient presents with  . Knee Pain    Due to a fall last week.    HPI: The patient is a 74 y.o. female who presents today for right knee pain. An accident happened over a week ago.  She states her alarm went off at 6:45am and she thought she moved in the middle of her bed, but she was actually on the edge of her bed and when she went to press the snooze button again, her right leg went up in the air and crashed into her side table. She hit the inside/back of her right knee. She felt a lump, but this went away. Her leg is so sensitive and it hurts. She has a limp when she walks. She had pretty significant bruising. Pain rated as an 8/10. If she walks, it feels like her knee cap is going to pop and it has a pulling sensation. She does have pain with non weight bearing as well.  She was able to take her grandbaby out for a walk yesterday and it didn't bother her. She states her knee is swollen. She has not taken tylenol and it does help with the pain. She has no pain in her calf or swelling. No pain in back of her knee. Pain is mainly on inner thigh and knee cap. Pain is not as worse as it was a week ago.   Review of Systems  Constitutional: Negative for chills, fatigue and fever.  HENT: Negative for sinus pressure, sneezing and sore throat.   Respiratory: Positive for shortness of breath.        Due to asthma   Musculoskeletal: Positive for joint swelling.  Allergic/Immunologic: Positive for environmental allergies.  Neurological: Negative for dizziness, numbness and headaches.    Allergies Patient is allergic to celebrex [celecoxib]; glipizide; penicillins; and sulfa antibiotics.  Past Medical History Patient  has a past medical history of Bladder cancer ([place]) (first dx 2012), Chronic constipation, Full dentures, GERD (gastroesophageal reflux disease), Hyperlipidemia,  Hypertension, Mild intermittent asthma, OA (osteoarthritis), Trigger finger of both hands, and Type 2 diabetes mellitus ([place]).  Surgical History Patient  has a past surgical history that includes Bladder surgery (2012   in [facility]); Cholecystectomy (1970); Transurethral resection of bladder tumor (N/A, 05/24/2014); Cystoscopy/retrograde/ureteroscopy (Left, 05/24/2014); and Transurethral resection of bladder tumor (N/A, 08/04/2015).  Family History Pateint's family history includes AAA (abdominal aortic aneurysm) in her sister; Cancer in her brother and father; Diabetes in her brother; Early death in her mother.  Social History Patient  reports that she quit smoking about 25 years ago. Her smoking use included cigarettes. She quit after 30.00 years of use. She has never used smokeless tobacco. She reports current alcohol use. She reports that she does not use drugs.    Objective: Vitals:   01/21/20 1326  BP: (!) 142/88  Pulse: 96  Temp: (!) 96.3 F (35.7 C)  TempSrc: Temporal  SpO2: 94%  Weight: 168 lb 6.4 oz (76.4 kg)  Height: 5\' 4"  (1.626 m)    Body mass index is 28.91 kg/m.  Physical Exam Vitals reviewed.  Constitutional:      Appearance: Normal appearance. She is normal weight.  HENT:     Head: Normocephalic and atraumatic.  Pulmonary:     Effort: Pulmonary effort is normal.     Breath sounds: Normal breath sounds.  Abdominal:  General: Abdomen is flat. Bowel sounds are normal.     Palpations: Abdomen is soft.  Musculoskeletal:        General: Tenderness present.     Right lower leg: No edema.     Left lower leg: No edema.     Comments: TTP over right patella, but no swelling or redness. NO edema in calf and negative homan sign. No superficial veins. She has extreme TTP over her inner right thigh, just superior to knee. Faint bruising. No warmth/edema. Some induration around this area which is "lump" she is feeling. Knee can be extended to 180 degrees and can flex > 90  degrees. Unable to do draw signs as it hurts her. +weight bearing and very slight limp with walking   Neurological:     General: No focal deficit present.     Mental Status: She is alert and oriented to person, place, and time.  Psychiatric:        Mood and Affect: Mood normal.        Behavior: Behavior normal.    Right knee xray: no acute fracture. Official read pending.     Assessment/plan: 1. Contusion of leg, right, initial encounter Conservative therapy. Discussed heat, voltaren, tylenol and tramadol for severe pain. If not better or getting worse will need to do further imaging, but discussed this will take some time to get better.   2. Injury of right knee, initial encounter Xray with no acute fractures, read pending. Will see her back next week for better exam and discussed if not getting better Mri may be warranted. Plan per above.  - DG Knee Complete 4 Views Right; Future  Total time of encounter: 35 minutes total time of encounter, including 22 minutes spent in face-to-face patient care. This time includes coordination of care and counseling regarding acute complain of knee injury, work up and review of xray and treatment plan. Remainder of non-face-to-face time involved reviewing chart documents/testing relevant to the patient encounter and documentation in the medical record.  This visit occurred during the SARS-CoV-2 public health emergency.  Safety protocols were in place, including screening questions prior to the visit, additional usage of staff PPE, and extensive cleaning of exam room while observing appropriate contact time as indicated for disinfecting solutions.     Return in about 1 week (around 01/28/2020) for diabetes and check up on leg/knee .    Orma Flaming, MD [place]   01/21/2020

## 2020-01-21 NOTE — Telephone Encounter (Signed)
Caller states she fell about a week or two ago, and her knee has been swollen ever since. She has some bruising on the knee, and it is hurting above her knee and inner thigh. There is a lump. Translation No Nurse Assessment Nurse: Harlow Mares, RN, Suanne Marker Date/Time (Eastern Time): 01/21/2020 10:06:51 AM Confirm and document reason for call. If symptomatic, describe symptoms. ---Caller states she fell about a week or two ago, and her knee has been swollen ever since. She has some bruising on the knee, and it is hurting above her knee and inner thigh. There is a lump. Behind and on the knee is bruising. Has the patient had close contact with a person known or suspected to have the novel coronavirus illness OR traveled / lives in area with major community spread (including international travel) in the last 14 days from the onset of symptoms? * If Asymptomatic, screen for exposure and travel within the last 14 days. ---No Does the patient have any new or worsening symptoms? ---Yes Will a triage be completed? ---Yes Related visit to physician within the last 2 weeks? ---N/A Does the PT have any chronic conditions? (i.e. diabetes, asthma, this includes High risk factors for pregnancy, etc.) ---Yes List chronic conditions. ---diabetes; HTN; high chol; Is this a behavioral health or substance abuse call? ---No Guidelines Guideline Title Affirmed Question Affirmed Notes Nurse Date/Time (Eastern Time) Knee Injury [1] High-risk adult (e.g., age > 59, osteoporosis, Harlow Mares, RN, Suanne Marker 01/21/2020 10:10:49 AM PLEASE NOTE: All timestamps contained within this report are represented as [place] Standard Time. CONFIDENTIALTY NOTICE: This fax transmission is intended only for the addressee. It contains information that is legally privileged, confidential or otherwise protected from use or disclosure. If you are not the intended recipient, you are strictly prohibited from reviewing, disclosing, copying using  or disseminating any of this information or taking any action in reliance on or regarding this information. If you have received this fax in error, please notify us immediately by telephone so that we can arrange for its return to [place]. Phone: [phone], Toll-Free: [phone], Fax: [phone] Page: 2 of 2 Call Id: [id number] Guidelines Guideline Title Affirmed Question Affirmed Notes Nurse Date/Time Eilene Ghazi Time) chronic steroid use) AND [2] limping Disp. Time Eilene Ghazi Time) Disposition Final User 01/21/2020 10:14:33 AM See PCP within 24 Hours Yes Harlow Mares, RN, Rosalyn Charters Disagree/Comply Comply Caller Understands Yes PreDisposition Call Doctor Care Advice Given Per Guideline SEE PCP WITHIN 24 HOURS: * IF OFFICE WILL BE OPEN: You need to be seen within the next 24 hours. Call your doctor (or NP/PA) when the office opens and make an appointment. CRUTCHES: Try not to put any weight on the injured leg until evaluated. Obtain a pair of crutches from the local pharmacy. LOCAL COLD: For bruises or swelling, apply a cold pack or an ice bag (wrapped in a moist towel) to the area for 20 minutes per hour. Repeat for 4 consecutive hours. (Reason: reduce the bleeding and pain) CALL BACK IF: CARE ADVICE given per Knee Injury (Adult) guideline. * You become worse. Referrals REFERRED TO PCP OFFICE

## 2020-01-21 NOTE — Telephone Encounter (Signed)
error 

## 2020-01-22 ENCOUNTER — Ambulatory Visit: Payer: Medicare Other | Admitting: Physician Assistant

## 2020-01-25 ENCOUNTER — Other Ambulatory Visit: Payer: Self-pay | Admitting: Family Medicine

## 2020-01-25 DIAGNOSIS — R600 Localized edema: Secondary | ICD-10-CM

## 2020-01-26 NOTE — Telephone Encounter (Signed)
Dr Shelby Mattocks patient

## 2020-01-27 ENCOUNTER — Other Ambulatory Visit: Payer: Self-pay | Admitting: Family Medicine

## 2020-01-28 ENCOUNTER — Ambulatory Visit (INDEPENDENT_AMBULATORY_CARE_PROVIDER_SITE_OTHER): Payer: Medicare Other | Admitting: Family Medicine

## 2020-01-28 ENCOUNTER — Other Ambulatory Visit: Payer: Self-pay

## 2020-01-28 ENCOUNTER — Encounter: Payer: Self-pay | Admitting: Family Medicine

## 2020-01-28 VITALS — BP 126/78 | HR 91 | Temp 97.3°F | Ht 64.0 in | Wt 167.4 lb

## 2020-01-28 DIAGNOSIS — Z7185 Encounter for immunization safety counseling: Secondary | ICD-10-CM

## 2020-01-28 DIAGNOSIS — E1169 Type 2 diabetes mellitus with other specified complication: Secondary | ICD-10-CM

## 2020-01-28 DIAGNOSIS — Z1211 Encounter for screening for malignant neoplasm of colon: Secondary | ICD-10-CM

## 2020-01-28 DIAGNOSIS — E785 Hyperlipidemia, unspecified: Secondary | ICD-10-CM

## 2020-01-28 DIAGNOSIS — E119 Type 2 diabetes mellitus without complications: Secondary | ICD-10-CM

## 2020-01-28 DIAGNOSIS — Z7189 Other specified counseling: Secondary | ICD-10-CM | POA: Diagnosis not present

## 2020-01-28 LAB — COMPREHENSIVE METABOLIC PANEL
ALT: 20 U/L (ref 0–35)
AST: 17 U/L (ref 0–37)
Albumin: 4.2 g/dL (ref 3.5–5.2)
Alkaline Phosphatase: 47 U/L (ref 39–117)
BUN: 24 mg/dL — ABNORMAL HIGH (ref 6–23)
CO2: 28 mEq/L (ref 19–32)
Calcium: 10.3 mg/dL (ref 8.4–10.5)
Chloride: 104 mEq/L (ref 96–112)
Creatinine, Ser: 0.76 mg/dL (ref 0.40–1.20)
GFR: 74.32 mL/min (ref >60.00)
Glucose, Bld: 134 mg/dL — ABNORMAL HIGH (ref 70–99)
Potassium: 4 mEq/L (ref 3.5–5.1)
Sodium: 141 mEq/L (ref 135–145)
Total Bilirubin: 0.4 mg/dL (ref 0.2–1.2)
Total Protein: 7.1 g/dL (ref 6.0–8.3)

## 2020-01-28 LAB — HEMOGLOBIN A1C: Hgb A1c MFr Bld: 7 % — ABNORMAL HIGH (ref 4.6–6.5)

## 2020-01-28 LAB — LIPID PANEL
Cholesterol: 126 mg/dL (ref 0–200)
HDL: 27.9 mg/dL — ABNORMAL LOW (ref >39.00)
LDL Cholesterol: 61 mg/dL (ref 0–99)
NonHDL: 98.21
Total CHOL/HDL Ratio: 5
Triglycerides: 187 mg/dL — ABNORMAL HIGH (ref 0.0–149.0)
VLDL: 37.4 mg/dL (ref 0.0–40.0)

## 2020-01-28 NOTE — Progress Notes (Addendum)
Patient: Teresa Rice MRN: [id number] DOB: 1946-11-18 PCP: Orma Flaming, MD     Subjective:  Chief Complaint  Patient presents with  . Diabetes  . Hyperlipidemia    HPI: The patient is a 74 y.o. female who presents today for diabetes and cholesterol  follow up.   Diabetes: Patient is here for follow up of type 2 diabetes. First diagnosed 59.  Currently on the following medications janumet and trulicity. Takes medications as prescribed. Last A1C was 7.6 . Currently exercising and following diabetic diet. Sugars range from 115 to 200. Denies any hypoglycemic events. Denies any vision changes, nausea, vomiting, abdominal pain, ulcers/paraesthesia in feet, polyuria, polydipsia or polyphagia. Denies any chest pain, shortness of breath.   Hyperlipidemia: we stopped her fenofibrate and we started back her statin. We are going to check her lipid panel today. She didn't fast though. No issues with statin.   I saw her on Monday for a contusion on her leg. It is slightly improving, but still sore.   Review of Systems  Constitutional: Negative for chills, fatigue and fever.  HENT: Negative for dental problem, ear pain, hearing loss, sinus pressure, sinus pain, sore throat and trouble swallowing.   Eyes: Negative for visual disturbance.  Respiratory: Negative for cough, chest tightness, shortness of breath and wheezing.   Cardiovascular: Negative for chest pain, palpitations and leg swelling.  Gastrointestinal: Negative for abdominal pain, blood in stool, diarrhea, nausea and vomiting.  Endocrine: Negative for cold intolerance, polydipsia, polyphagia and polyuria.  Genitourinary: Negative for dysuria and hematuria.  Musculoskeletal: Negative for arthralgias.       Pain on inner right thigh from fall   Skin: Negative for rash.  Neurological: Negative for dizziness, light-headedness and headaches.  Psychiatric/Behavioral: Negative for dysphoric mood and sleep disturbance. The patient is  not nervous/anxious.     Allergies Patient is allergic to celebrex [celecoxib]; glipizide; penicillins; and sulfa antibiotics.  Past Medical History Patient  has a past medical history of Bladder cancer ([place]) (first dx 2012), Chronic constipation, Full dentures, GERD (gastroesophageal reflux disease), Hyperlipidemia, Hypertension, Mild intermittent asthma, OA (osteoarthritis), Trigger finger of both hands, and Type 2 diabetes mellitus ([place]).  Surgical History Patient  has a past surgical history that includes Bladder surgery (2012   in [facility]); Cholecystectomy (1970); Transurethral resection of bladder tumor (N/A, 05/24/2014); Cystoscopy/retrograde/ureteroscopy (Left, 05/24/2014); and Transurethral resection of bladder tumor (N/A, 08/04/2015).  Family History Pateint's family history includes AAA (abdominal aortic aneurysm) in her sister; Cancer in her brother and father; Diabetes in her brother; Early death in her mother.  Social History Patient  reports that she quit smoking about 25 years ago. Her smoking use included cigarettes. She quit after 30.00 years of use. She has never used smokeless tobacco. She reports current alcohol use. She reports that she does not use drugs.    Objective: Vitals:   01/28/20 0800  BP: 126/78  Pulse: 91  Temp: (!) 97.3 F (36.3 C)  TempSrc: Temporal  SpO2: 97%  Weight: 167 lb 6.4 oz (75.9 kg)  Height: 5\' 4"  (1.626 m)    Body mass index is 28.73 kg/m.  Physical Exam Vitals reviewed.  Constitutional:      Appearance: Normal appearance. She is well-developed and normal weight.  HENT:     Head: Normocephalic and atraumatic.     Right Ear: Tympanic membrane, ear canal and external ear normal.     Left Ear: Tympanic membrane, ear canal and external ear normal.  Nose: Nose normal.     Mouth/Throat:     Mouth: Mucous membranes are moist.  Eyes:     Extraocular Movements: Extraocular movements intact.     Conjunctiva/sclera: Conjunctivae  normal.     Pupils: Pupils are equal, round, and reactive to light.  Neck:     Thyroid: No thyromegaly.  Cardiovascular:     Rate and Rhythm: Normal rate and regular rhythm.     Heart sounds: Normal heart sounds. No murmur.  Pulmonary:     Effort: Pulmonary effort is normal.     Breath sounds: Normal breath sounds.  Abdominal:     General: Abdomen is flat. Bowel sounds are normal. There is no distension.     Palpations: Abdomen is soft.     Tenderness: There is no abdominal tenderness.  Musculoskeletal:     Cervical back: Normal range of motion and neck supple.  Lymphadenopathy:     Cervical: No cervical adenopathy.  Skin:    General: Skin is warm and dry.     Findings: No rash.  Neurological:     General: No focal deficit present.     Mental Status: She is alert and oriented to person, place, and time.     Cranial Nerves: No cranial nerve deficit.     Coordination: Coordination normal.     Deep Tendon Reflexes: Reflexes normal.  Psychiatric:        Mood and Affect: Mood normal.        Behavior: Behavior normal.        Diabetic Foot Exam - Simple   Simple Foot Form Diabetic Foot exam was performed with the following findings: Yes 01/28/2020  8:26 AM  Visual Inspection Sensation Testing Pulse Check Comments Normal exam except diminished left posterior tibialis pulse.      Assessment/plan: 1. Type 2 diabetes mellitus without complication, without long-term current use of insulin (HCC) Checking a1c today. Foot exam done today and wnl. On statin/acei. Eye exam in may of this year. Continue diet and will continue trulicity and janumet for now and adjust when labs are back if needed. F/u in 3 months.  - Hemoglobin A1c - Comprehensive metabolic panel  2. Hyperlipidemia associated with type 2 diabetes mellitus (HCC) Continue statin.  - Lipid panel  3. Screening for colon cancer  - Fecal occult blood, imunochemical; Future  4. Vaccine counseling Discussed covid  vaccine. Advised at her age benefits outweigh risks and advised she get this. She is going to continue to think on this. Information provided.    This visit occurred during the SARS-CoV-2 public health emergency.  Safety protocols were in place, including screening questions prior to the visit, additional usage of staff PPE, and extensive cleaning of exam room while observing appropriate contact time as indicated for disinfecting solutions.     Return in about 3 months (around 04/29/2020) for htn/diabetes routine f/u .   Orma Flaming, MD [place]   01/28/2020

## 2020-01-28 NOTE — Patient Instructions (Addendum)
Colorectal Cancer Screening  Colorectal cancer screening is a group of tests that are used to check for colorectal cancer before symptoms develop. Colorectal refers to the colon and rectum. The colon and rectum are located at the end of the digestive tract and carry bowel movements out of the body. Who should have screening? All adults starting at age 75 until age 87 should have screening. Your health care provider may recommend screening at age 71. You will have tests every 1-10 years, depending on your results and the type of screening test. You may have screening tests starting at an earlier age, or more frequently than other people, if you have any of the following risk factors:  A personal or family history of colorectal cancer or abnormal growths (polyps).  Inflammatory bowel disease, such as ulcerative colitis or Crohn's disease.  A history of having radiation treatment to the abdomen or pelvic area for cancer.  Colorectal cancer symptoms, such as changes in bowel habits or blood in your stool.  A type of colon cancer syndrome that is passed from parent to child (hereditary), such as: ? Lynch syndrome. ? Familial adenomatous polyposis. ? Turcot syndrome. ? Peutz-Jeghers syndrome. Screening recommendations for adults who are 72-48 years old vary depending on health. How is screening done? There are several types of colorectal screening tests. You may have one or more of the following:  Guaiac-based fecal occult blood testing. For this test, a stool (feces) sample is checked for hidden (occult) blood, which could be a sign of colorectal cancer.  Fecal immunochemical test (FIT). For this test, a stool sample is checked for blood, which could be a sign of colorectal cancer.  Stool DNA test. For this test, a stool sample is checked for blood and changes in DNA that could lead to colorectal cancer.  Sigmoidoscopy. During this test, a thin, flexible tube with a camera on the end  (sigmoidoscope) is used to examine the rectum and the lower colon.  Colonoscopy. During this test, a long, flexible tube with a camera on the end (colonoscope) is used to examine the entire colon and rectum. With a colonoscopy, it is possible to take a sample of tissue (biopsy) and remove small polyps during the test.  Virtual colonoscopy. Instead of a colonoscope, this type of colonoscopy uses X-rays (CT scan) and computers to produce images of the colon and rectum. What are the benefits of screening? Screening reduces your risk for colorectal cancer and can help identify cancer at an early stage, when the cancer can be removed or treated more easily. It is common for polyps to form in the lining of the colon, especially as you age. These polyps may be cancerous or become cancerous over time. Screening can identify these polyps. What are the risks of screening? Each screening test may have different risks.  Stool sample tests have fewer risks than other types of screening tests. However, you may need more tests to confirm results from a stool sample test.  Screening tests that involve X-rays expose you to low levels of radiation, which may slightly increase your cancer risk. The benefit of detecting cancer outweighs the slight increase in risk.  Screening tests such as sigmoidoscopy and colonoscopy may place you at risk for bleeding, intestinal damage, infection, or a reaction to medicines given during the exam. Talk with your health care provider to understand your risk for colorectal cancer and to make a screening plan that is right for you. Questions to ask your health care  provider  When should I start colorectal cancer screening?  What is my risk for colorectal cancer?  How often do I need screening?  Which screening tests do I need?  How do I get my test results?  What do my results mean? Where to find more information Learn more about colorectal cancer screening from:  The  [place]: www.cancer.org  The [employer]: www.cancer.gov Summary  Colorectal cancer screening is a group of tests used to check for colorectal cancer before symptoms develop.  Screening reduces your risk for colorectal cancer and can help identify cancer at an early stage, when the cancer can be removed or treated more easily.  All adults starting at age 53 until age 62 should have screening. Your health care provider may recommend screening at age 74.  You may have screening tests starting at an earlier age, or more frequently than other people, if you have certain risk factors.  Talk with your health care provider to understand your risk for colorectal cancer and to make a screening plan that is right for you. This information is not intended to replace advice given to you by your health care provider. Make sure you discuss any questions you have with your health care provider. Document Revised: 03/04/2019 Document Reviewed: 08/14/2017 Elsevier Patient Education  [place].  covid vaccine information   Our providers are recommending the vaccine to all our patients, especially if you are a patient who has medical conditions that make you high-risk for serious COVID disease.    As of right now there is NO known contraindication to getting the COVID vaccine. This means that everyone should be able to get this vaccine, even if they have other medical conditions or are pregnant. Side effects are always possible, but as with all vaccines, we believe the benefits outweigh the risks.    It is important to remember that when people are having "reactions" to the vaccine, these reactions are almost always NORMAL immune system responses. It is ok to take Benadryl/diphenhydramine for these symptoms. If you are having any trouble breathing and/or feel like your airway is swelling, go to an urgent care or emergency department. Very few people are having immune  responses so strong they need to go to the emergency department, but many people are not feeling well for 1 to 2 days after the vaccine is given.   The CDC states that if you have had an allergic reaction to polyethylene glycol (PEG) or polysorbate you should NOT get an mRNA COVID-19 vaccine.  Polysorbate is not an ingredient in either mRNA COVID-19 vaccine but is closely related to PEG, which is in the vaccines.   If you have any questions about adverse reactions related to COVID-19 vaccines, please visit [email or web address] or set up a virtual visit with your primary care provider.     For questions related to COVID-19 vaccine distribution or appointments, please visit [email or web address].

## 2020-02-04 ENCOUNTER — Other Ambulatory Visit: Payer: Self-pay

## 2020-02-04 ENCOUNTER — Telehealth: Payer: Self-pay

## 2020-02-04 DIAGNOSIS — H9193 Unspecified hearing loss, bilateral: Secondary | ICD-10-CM

## 2020-02-04 NOTE — Telephone Encounter (Signed)
Called patient she has gone by office they checked and are covered by her insurance. She has app set up. I have started referral. She also wanted to let you know that she is going to go by and pick up records before next appointment and bring with her for follow up with our office.

## 2020-02-04 NOTE — Telephone Encounter (Signed)
Please let her know that this provider is not in her network... does she still want [place] to do this as it may be very expensive.   Thanks, dr. Rogers Blocker

## 2020-02-04 NOTE — Telephone Encounter (Signed)
Patient is calling regarding hearing referral. Patient would like to see Dr. Lonzo Candy Phone number to that location is [phone]

## 2020-02-13 ENCOUNTER — Other Ambulatory Visit: Payer: Self-pay | Admitting: Family Medicine

## 2020-02-16 ENCOUNTER — Other Ambulatory Visit: Payer: Self-pay

## 2020-02-16 MED ORDER — FUROSEMIDE 20 MG PO TABS: ORAL_TABLET | ORAL | 0 refills | Status: DC

## 2020-03-03 DIAGNOSIS — H90A22 Sensorineural hearing loss, unilateral, left ear, with restricted hearing on the contralateral side: Secondary | ICD-10-CM | POA: Diagnosis not present

## 2020-03-07 ENCOUNTER — Other Ambulatory Visit: Payer: Self-pay | Admitting: Family Medicine

## 2020-03-07 ENCOUNTER — Other Ambulatory Visit: Payer: Self-pay

## 2020-03-16 ENCOUNTER — Ambulatory Visit: Payer: Medicare Other | Admitting: Family Medicine

## 2020-03-16 DIAGNOSIS — H905 Unspecified sensorineural hearing loss: Secondary | ICD-10-CM | POA: Diagnosis not present

## 2020-03-20 ENCOUNTER — Other Ambulatory Visit: Payer: Self-pay | Admitting: Family Medicine

## 2020-03-29 ENCOUNTER — Encounter: Payer: Self-pay | Admitting: Pharmacist

## 2020-04-06 ENCOUNTER — Other Ambulatory Visit: Payer: Self-pay | Admitting: Family Medicine

## 2020-04-08 DIAGNOSIS — Z8551 Personal history of malignant neoplasm of bladder: Secondary | ICD-10-CM | POA: Diagnosis not present

## 2020-04-14 ENCOUNTER — Encounter: Payer: Self-pay | Admitting: Family Medicine

## 2020-04-15 ENCOUNTER — Other Ambulatory Visit: Payer: Self-pay

## 2020-04-15 MED ORDER — JANUMET 50-1000 MG PO TABS: ORAL_TABLET | ORAL | 1 refills | Status: DC

## 2020-04-18 ENCOUNTER — Encounter: Payer: Self-pay | Admitting: Family Medicine

## 2020-04-18 ENCOUNTER — Ambulatory Visit (INDEPENDENT_AMBULATORY_CARE_PROVIDER_SITE_OTHER): Payer: Medicare Other | Admitting: Family Medicine

## 2020-04-18 ENCOUNTER — Other Ambulatory Visit: Payer: Self-pay

## 2020-04-18 VITALS — BP 154/74 | HR 106 | Temp 96.0°F | Ht 64.0 in | Wt 169.0 lb

## 2020-04-18 DIAGNOSIS — J0100 Acute maxillary sinusitis, unspecified: Secondary | ICD-10-CM

## 2020-04-18 DIAGNOSIS — Z9109 Other allergy status, other than to drugs and biological substances: Secondary | ICD-10-CM

## 2020-04-18 MED ORDER — PREDNISONE 20 MG PO TABS: 40.0000 mg | ORAL_TABLET | Freq: Every day | ORAL | 0 refills | Status: DC

## 2020-04-18 MED ORDER — FLUTICASONE PROPIONATE 50 MCG/ACT NA SUSP: 2.0000 | Freq: Every day | NASAL | 6 refills | Status: DC

## 2020-04-18 NOTE — Progress Notes (Signed)
Patient: Teresa Rice MRN: [id number] DOB: 28-Aug-1946 PCP: Orma Flaming, MD     Subjective:  Chief Complaint  Patient presents with  . Cough  . Asthma    Grandson sick, Started symptoms Friday.     HPI: The patient is a 74 y.o. female who presents today for a cough, she says that she thinks it may be Asthma. She states symptoms started on Friday. She went to go babysit her grandson (13 months) on Friday. When she got to her daughters house she told her to go home because she sounded bad. She didn't think she sounded bad, but a couple hours later after she got home it just hit her. She states she was tired and she had a lot of congestion, spitting up and a lot of wheezing. She has never had any fever. She is now hoarse. She states it is a wet cough. No sick contacts that she is aware of. She has not taken any over the counter medication. She is on twice daily symbicort and has her rescue inhaler that she is really not needing. She denies any headache, N/V/D, loss of taste or smell, sinus congestion, sore throat or ear pain. She does take zyrtec daily.   Review of Systems  Constitutional: Positive for fatigue.  HENT: Positive for congestion. Negative for sore throat.   Respiratory: Positive for cough and wheezing. Negative for shortness of breath.   Cardiovascular: Negative for chest pain and palpitations.  Gastrointestinal: Negative for abdominal pain, nausea and vomiting.  Neurological: Negative for dizziness, light-headedness and headaches.    Allergies Patient is allergic to celebrex [celecoxib]; glipizide; penicillins; and sulfa antibiotics.  Past Medical History Patient  has a past medical history of Bladder cancer ([place]) (first dx 2012), Chronic constipation, Full dentures, GERD (gastroesophageal reflux disease), Hyperlipidemia, Hypertension, Mild intermittent asthma, OA (osteoarthritis), Trigger finger of both hands, and Type 2 diabetes mellitus ([place]).  Surgical  History Patient  has a past surgical history that includes Bladder surgery (2012   in [facility]); Cholecystectomy (1970); Transurethral resection of bladder tumor (N/A, 05/24/2014); Cystoscopy/retrograde/ureteroscopy (Left, 05/24/2014); and Transurethral resection of bladder tumor (N/A, 08/04/2015).  Family History Pateint's family history includes AAA (abdominal aortic aneurysm) in her sister; Cancer in her brother and father; Diabetes in her brother; Early death in her mother.  Social History Patient  reports that she quit smoking about 25 years ago. Her smoking use included cigarettes. She quit after 30.00 years of use. She has never used smokeless tobacco. She reports current alcohol use. She reports that she does not use drugs.    Objective: Vitals:   04/18/20 1035  BP: (!) 154/74  Pulse: (!) 106  Temp: (!) 96 F (35.6 C)  TempSrc: Temporal  SpO2: 98%  Weight: 169 lb (76.7 kg)  Height: 5\' 4"  (1.626 m)    Body mass index is 29.01 kg/m.  Physical Exam Vitals reviewed.  Constitutional:      Appearance: Normal appearance. She is obese.  HENT:     Head: Normocephalic and atraumatic.     Comments: ttp over maxillary sinuses     Right Ear: Tympanic membrane, ear canal and external ear normal.     Left Ear: Tympanic membrane, ear canal and external ear normal.     Nose: Rhinorrhea present.     Mouth/Throat:     Mouth: Mucous membranes are moist.     Comments: Cobblestoning on posterior pharynx  Eyes:     Extraocular Movements: Extraocular movements intact.  Pupils: Pupils are equal, round, and reactive to light.  Neck:     Vascular: No carotid bruit.  Cardiovascular:     Rate and Rhythm: Normal rate and regular rhythm.     Heart sounds: Normal heart sounds.  Pulmonary:     Effort: Pulmonary effort is normal. No respiratory distress.     Breath sounds: Normal breath sounds. No wheezing, rhonchi or rales.  Abdominal:     General: Bowel sounds are normal.     Palpations:  Abdomen is soft.  Musculoskeletal:     Cervical back: Normal range of motion and neck supple.  Lymphadenopathy:     Cervical: No cervical adenopathy.  Skin:    Capillary Refill: Capillary refill takes less than 2 seconds.  Neurological:     General: No focal deficit present.     Mental Status: She is alert and oriented to person, place, and time.  Psychiatric:        Mood and Affect: Mood normal.        Behavior: Behavior normal.        Assessment/plan: 1. Subacute maxillary sinusitis Appears viral vs. Allergies at this point and less than 7 days. We are going to do conservative management with cool mist humidifier, flonase and short course of steroids. Precautions given  and she will let me know if not getting better and we will add on antibiotic.   2. Environmental allergies Continue zyrtec daily and I want her to start flonase nightly. Recommended cool mist humdifier as well.   This visit occurred during the SARS-CoV-2 public health emergency.  Safety protocols were in place, including screening questions prior to the visit, additional usage of staff PPE, and extensive cleaning of exam room while observing appropriate contact time as indicated for disinfecting solutions.    Return if symptoms worsen or fail to improve.   Orma Flaming, MD [place]   04/18/2020

## 2020-04-18 NOTE — Patient Instructions (Addendum)
I think you have allergies/post nasal drip and possible sinus issues.   1) start flonase at night. Will help nasal congestion. Get cool mist humidifier at night 2) continue zyrtec daily  3) sending in steroid course to take for 5 days. You will take 2 pills each morning. This will help hoarseness, congestion and allergies.  If you are not getting better or feel like your symptoms are getting worse please let me know so we can send in antibiotics.   Sinusitis, Adult Sinusitis is soreness and swelling (inflammation) of your sinuses. Sinuses are hollow spaces in the bones around your face. They are located:  Around your eyes.  In the middle of your forehead.  Behind your nose.  In your cheekbones. Your sinuses and nasal passages are lined with a fluid called mucus. Mucus drains out of your sinuses. Swelling can trap mucus in your sinuses. This lets germs (bacteria, virus, or fungus) grow, which leads to infection. Most of the time, this condition is caused by a virus. What are the causes? This condition is caused by:  Allergies.  Asthma.  Germs.  Things that block your nose or sinuses.  Growths in the nose (nasal polyps).  Chemicals or irritants in the air.  Fungus (rare). What increases the risk? You are more likely to develop this condition if:  You have a weak body defense system (immune system).  You do a lot of swimming or diving.  You use nasal sprays too much.  You smoke. What are the signs or symptoms? The main symptoms of this condition are pain and a feeling of pressure around the sinuses. Other symptoms include:  Stuffy nose (congestion).  Runny nose (drainage).  Swelling and warmth in the sinuses.  Headache.  Toothache.  A cough that may get worse at night.  Mucus that collects in the throat or the back of the nose (postnasal drip).  Being unable to smell and taste.  Being very tired (fatigue).  A fever.  Sore throat.  Bad breath. How is  this diagnosed? This condition is diagnosed based on:  Your symptoms.  Your medical history.  A physical exam.  Tests to find out if your condition is short-term (acute) or long-term (chronic). Your doctor may: ? Check your nose for growths (polyps). ? Check your sinuses using a tool that has a light (endoscope). ? Check for allergies or germs. ? Do imaging tests, such as an MRI or CT scan. How is this treated? Treatment for this condition depends on the cause and whether it is short-term or long-term.  If caused by a virus, your symptoms should go away on their own within 10 days. You may be given medicines to relieve symptoms. They include: ? Medicines that shrink swollen tissue in the nose. ? Medicines that treat allergies (antihistamines). ? A spray that treats swelling of the nostrils. ? Rinses that help get rid of thick mucus in your nose (nasal saline washes).  If caused by bacteria, your doctor may wait to see if you will get better without treatment. You may be given antibiotic medicine if you have: ? A very bad infection. ? A weak body defense system.  If caused by growths in the nose, you may need to have surgery. Follow these instructions at home: Medicines  Take, use, or apply over-the-counter and prescription medicines only as told by your doctor. These may include nasal sprays.  If you were prescribed an antibiotic medicine, take it as told by your doctor. Do  not stop taking the antibiotic even if you start to feel better. Hydrate and humidify   Drink enough water to keep your pee (urine) pale yellow.  Use a cool mist humidifier to keep the humidity level in your home above 50%.  Breathe in steam for 10-15 minutes, 3-4 times a day, or as told by your doctor. You can do this in the bathroom while a hot shower is running.  Try not to spend time in cool or dry air. Rest  Rest as much as you can.  Sleep with your head raised (elevated).  Make sure you get  enough sleep each night. General instructions   Put a warm, moist washcloth on your face 3-4 times a day, or as often as told by your doctor. This will help with discomfort.  Wash your hands often with soap and water. If there is no soap and water, use hand sanitizer.  Do not smoke. Avoid being around people who are smoking (secondhand smoke).  Keep all follow-up visits as told by your doctor. This is important. Contact a doctor if:  You have a fever.  Your symptoms get worse.  Your symptoms do not get better within 10 days. Get help right away if:  You have a very bad headache.  You cannot stop throwing up (vomiting).  You have very bad pain or swelling around your face or eyes.  You have trouble seeing.  You feel confused.  Your neck is stiff.  You have trouble breathing. Summary  Sinusitis is swelling of your sinuses. Sinuses are hollow spaces in the bones around your face.  This condition is caused by tissues in your nose that become inflamed or swollen. This traps germs. These can lead to infection.  If you were prescribed an antibiotic medicine, take it as told by your doctor. Do not stop taking it even if you start to feel better.  Keep all follow-up visits as told by your doctor. This is important. This information is not intended to replace advice given to you by your health care provider. Make sure you discuss any questions you have with your health care provider. Document Revised: 04/14/2018 Document Reviewed: 04/14/2018 Elsevier Patient Education  [place].

## 2020-05-05 ENCOUNTER — Ambulatory Visit (INDEPENDENT_AMBULATORY_CARE_PROVIDER_SITE_OTHER): Payer: Medicare Other | Admitting: Family Medicine

## 2020-05-05 ENCOUNTER — Encounter: Payer: Self-pay | Admitting: Family Medicine

## 2020-05-05 ENCOUNTER — Other Ambulatory Visit: Payer: Self-pay

## 2020-05-05 VITALS — BP 128/70 | HR 96 | Temp 97.3°F | Ht 64.0 in | Wt 170.2 lb

## 2020-05-05 DIAGNOSIS — E039 Hypothyroidism, unspecified: Secondary | ICD-10-CM | POA: Diagnosis not present

## 2020-05-05 DIAGNOSIS — M8580 Other specified disorders of bone density and structure, unspecified site: Secondary | ICD-10-CM | POA: Insufficient documentation

## 2020-05-05 DIAGNOSIS — E119 Type 2 diabetes mellitus without complications: Secondary | ICD-10-CM

## 2020-05-05 DIAGNOSIS — I10 Essential (primary) hypertension: Secondary | ICD-10-CM

## 2020-05-05 DIAGNOSIS — E1159 Type 2 diabetes mellitus with other circulatory complications: Secondary | ICD-10-CM

## 2020-05-05 DIAGNOSIS — R252 Cramp and spasm: Secondary | ICD-10-CM | POA: Diagnosis not present

## 2020-05-05 DIAGNOSIS — K219 Gastro-esophageal reflux disease without esophagitis: Secondary | ICD-10-CM

## 2020-05-05 LAB — COMPREHENSIVE METABOLIC PANEL
ALT: 28 U/L (ref 0–35)
AST: 30 U/L (ref 0–37)
Albumin: 4.3 g/dL (ref 3.5–5.2)
Alkaline Phosphatase: 58 U/L (ref 39–117)
BUN: 16 mg/dL (ref 6–23)
CO2: 29 mEq/L (ref 19–32)
Calcium: 9.8 mg/dL (ref 8.4–10.5)
Chloride: 101 mEq/L (ref 96–112)
Creatinine, Ser: 0.71 mg/dL (ref 0.40–1.20)
GFR: 80.33 mL/min (ref >60.00)
Glucose, Bld: 193 mg/dL — ABNORMAL HIGH (ref 70–99)
Potassium: 4.2 mEq/L (ref 3.5–5.1)
Sodium: 139 mEq/L (ref 135–145)
Total Bilirubin: 0.3 mg/dL (ref 0.2–1.2)
Total Protein: 6.7 g/dL (ref 6.0–8.3)

## 2020-05-05 LAB — CBC WITH DIFFERENTIAL/PLATELET
Basophils Absolute: 0.1 10*3/uL (ref 0.0–0.1)
Basophils Relative: 0.9 % (ref 0.0–3.0)
Eosinophils Absolute: 0.2 10*3/uL (ref 0.0–0.7)
Eosinophils Relative: 3.5 % (ref 0.0–5.0)
HCT: 38 % (ref 36.0–46.0)
Hemoglobin: 12.5 g/dL (ref 12.0–15.0)
Lymphocytes Relative: 39.8 % (ref 12.0–46.0)
Lymphs Abs: 2.5 10*3/uL (ref 0.7–4.0)
MCHC: 33 g/dL (ref 30.0–36.0)
MCV: 79.8 fl (ref 78.0–100.0)
Monocytes Absolute: 0.4 10*3/uL (ref 0.1–1.0)
Monocytes Relative: 5.9 % (ref 3.0–12.0)
Neutro Abs: 3.2 10*3/uL (ref 1.4–7.7)
Neutrophils Relative %: 49.9 % (ref 43.0–77.0)
Platelets: 311 10*3/uL (ref 150.0–400.0)
RBC: 4.76 Mil/uL (ref 3.87–5.11)
RDW: 14.3 % (ref 11.5–15.5)
WBC: 6.3 10*3/uL (ref 4.0–10.5)

## 2020-05-05 LAB — TSH: TSH: 4.7 u[IU]/mL — ABNORMAL HIGH (ref 0.35–4.50)

## 2020-05-05 LAB — T4, FREE: Free T4: 0.83 ng/dL (ref 0.60–1.60)

## 2020-05-05 LAB — CK: Total CK: 56 U/L (ref 7–177)

## 2020-05-05 MED ORDER — OMEPRAZOLE 20 MG PO CPDR: 20.0000 mg | DELAYED_RELEASE_CAPSULE | Freq: Every day | ORAL | 1 refills | Status: DC

## 2020-05-05 NOTE — Patient Instructions (Signed)
-  routine lab work today and checking labs for the muscle cramping as well -for leg cramping I want you to stretch every night. If starts to become more frequent, we will do muscle relaxer as needed. Checking labs on this today as well to make sure no electrolyte/thyroid and CK.   -depending on A1c f/u in 3-6 months!   Enjoy your summer! Dr. Rogers Blocker

## 2020-05-05 NOTE — Progress Notes (Signed)
Patient: Teresa Rice MRN: [id number] DOB: 1946-10-27 PCP: Orma Flaming, MD     Subjective:  Chief Complaint  Patient presents with  . Diabetes  . Hypothyroidism  . leg cramping  . Hypertension    HPI: The patient is a 74 y.o. female who presents today for routine follow up of her diabetes, HTN and thyroid. She also has complaints of leg cramping that happened one time on Sunday.   Hypertension:  Here for follow up of hypertension.  Currently on lisinopril 40mg  . She does not take her blood pressure at home. Takes medication as prescribed and denies any side effects. Exercise includes walking. Weight has been stable. Denies any chest pain, headaches, shortness of breath, vision changes, swelling in lower extremities.   Diabetes:  Patient is here for follow up of type 2 diabetes. First diagnosed 14.  Currently on the following medications janumet 50-1000mg  BID and trulicity 1.5mg /week. Takes medications as prescribed. Last A1C was 7.0. Currently exercising and following diabetic diet (at times). Sugars range from 130-200. Denies any hypoglycemic events. Denies any vision changes, nausea, vomiting, abdominal pain, ulcers/paraesthesia in feet, polyuria, polydipsia or polyphagia. Denies any chest pain, shortness of breath. she has her eye exam this month.   Hypothyroid  Currently on synthroid 73mcg. Takes medication as she should in the AM. She is having leg cramping, but otherwise has no other symptoms.    Leg cramping She states she was sleeping Sunday night and all of a sudden she was woken up by her foot cramping really bad. She doesn't know what time it was. She states the first cramping lasted for about 5 mintues and then went away. She went back to sleep and then it happened again, but this time the cramping went up to her ankle. It went away again and then the third time the cramping was only on the lateral side of her lower left leg. She put her feet on the floor and then  just fell down because the pain was so bad from the cramping. She felt like it lasted forever, but it was maybe 45 minutes.   Review of Systems  Respiratory: Negative for cough, shortness of breath and wheezing.   Cardiovascular: Negative for chest pain and palpitations.  Gastrointestinal: Negative for abdominal pain, nausea and vomiting.  Neurological: Negative for dizziness, light-headedness and headaches.    Allergies Patient is allergic to celebrex [celecoxib], glipizide, penicillins, and sulfa antibiotics.  Past Medical History Patient  has a past medical history of Bladder cancer ([place]) (first dx 2012), Chronic constipation, Full dentures, GERD (gastroesophageal reflux disease), Hyperlipidemia, Hypertension, Mild intermittent asthma, OA (osteoarthritis), Trigger finger of both hands, and Type 2 diabetes mellitus ([place]).  Surgical History Patient  has a past surgical history that includes Bladder surgery (2012   in [facility]); Cholecystectomy (1970); Transurethral resection of bladder tumor (N/A, 05/24/2014); Cystoscopy/retrograde/ureteroscopy (Left, 05/24/2014); and Transurethral resection of bladder tumor (N/A, 08/04/2015).  Family History Pateint's family history includes AAA (abdominal aortic aneurysm) in her sister; Cancer in her brother and father; Diabetes in her brother; Early death in her mother.  Social History Patient  reports that she quit smoking about 25 years ago. Her smoking use included cigarettes. She quit after 30.00 years of use. She has never used smokeless tobacco. She reports current alcohol use. She reports that she does not use drugs.    Objective: Vitals:   05/05/20 0801  BP: 128/70  Pulse: 96  Temp: (!) 97.3 F (36.3 C)  TempSrc:  Temporal  SpO2: 99%  Weight: 170 lb 3.2 oz (77.2 kg)  Height: 5\' 4"  (1.626 m)    Body mass index is 29.21 kg/m.  Physical Exam Vitals reviewed.  Constitutional:      Appearance: Normal appearance.  HENT:     Head:  Normocephalic and atraumatic.     Ears:     Comments: HOH. Forgot hearing aides Neck:     Vascular: No carotid bruit.  Cardiovascular:     Rate and Rhythm: Normal rate and regular rhythm.     Heart sounds: Normal heart sounds.  Pulmonary:     Effort: Pulmonary effort is normal.     Breath sounds: Normal breath sounds.  Abdominal:     General: Abdomen is flat. Bowel sounds are normal.     Palpations: Abdomen is soft.  Musculoskeletal:     Cervical back: Normal range of motion and neck supple.  Skin:    Capillary Refill: Capillary refill takes less than 2 seconds.  Neurological:     General: No focal deficit present.     Mental Status: She is alert and oriented to person, place, and time.  Psychiatric:        Mood and Affect: Mood normal.        Behavior: Behavior normal.      Office Visit from 05/05/2020 in [place]  PHQ-2 Total Score 0         Assessment/plan: 1. Hypertension associated with diabetes ([place]) Blood pressure is to goal. Continue current anti-hypertensive medications per hpi. Refills not needed and routine lab work will be done today. Recommended routine exercise and healthy diet including DASH diet and mediterranean diet. Encouraged weight loss. F/u in 6 months.    - Comprehensive metabolic panel - CBC with Differential/Platelet  2. Type 2 diabetes mellitus without complication, without long-term current use of insulin ([place]) -continue janumet and trulicity. a1c to goal for age 63 months ago.  -eye appointment this month -on acei/statin -diet could be improved and discussed this today -f/u in 3-6 months depending on labs.   - Hemoglobin A1c  3. Acquired hypothyroidism, on Levothyoxine 25 mcg daily  - TSH - T4, free  4. Leg cramping Has happened once. Checking labs, CK and thyroid. Encouraged stretching nightly before bed and I'll follow up on labs/CK. If continues may do trial off statin and muscle relaxer prn.  -  CK   This visit occurred during the SARS-CoV-2 public health emergency.  Safety protocols were in place, including screening questions prior to the visit, additional usage of staff PPE, and extensive cleaning of exam room while observing appropriate contact time as indicated for disinfecting solutions.    Return in about 3 months (around 08/05/2020) for diabetes (6 months if a1c to goal) .   Orma Flaming, MD [place]   05/05/2020

## 2020-05-06 LAB — HEMOGLOBIN A1C: Hgb A1c MFr Bld: 7.6 % — ABNORMAL HIGH (ref 4.6–6.5)

## 2020-05-08 ENCOUNTER — Other Ambulatory Visit: Payer: Self-pay | Admitting: Family Medicine

## 2020-05-08 MED ORDER — TRULICITY 1.5 MG/0.5ML ~~LOC~~ SOAJ: 3.0000 mg | SUBCUTANEOUS | 1 refills | Status: DC

## 2020-05-11 ENCOUNTER — Encounter: Payer: Self-pay | Admitting: Family Medicine

## 2020-05-11 NOTE — Telephone Encounter (Signed)
Could we use same day slot, patient is wanting something Monday if possible states she is going out of town on Thursday.

## 2020-05-13 NOTE — Telephone Encounter (Signed)
Patient is scheduled for Monday.

## 2020-05-16 ENCOUNTER — Encounter: Payer: Self-pay | Admitting: Family Medicine

## 2020-05-16 ENCOUNTER — Ambulatory Visit (INDEPENDENT_AMBULATORY_CARE_PROVIDER_SITE_OTHER): Payer: Medicare Other | Admitting: Family Medicine

## 2020-05-16 ENCOUNTER — Other Ambulatory Visit: Payer: Self-pay

## 2020-05-16 VITALS — BP 130/80 | HR 89 | Temp 97.8°F | Ht 64.0 in | Wt 172.8 lb

## 2020-05-16 DIAGNOSIS — M79604 Pain in right leg: Secondary | ICD-10-CM | POA: Diagnosis not present

## 2020-05-16 DIAGNOSIS — M79605 Pain in left leg: Secondary | ICD-10-CM

## 2020-05-16 DIAGNOSIS — M2550 Pain in unspecified joint: Secondary | ICD-10-CM

## 2020-05-16 LAB — SEDIMENTATION RATE: Sed Rate: 9 mm/hr (ref 0–30)

## 2020-05-16 LAB — CBC WITH DIFFERENTIAL/PLATELET
Basophils Absolute: 0.1 10*3/uL (ref 0.0–0.1)
Basophils Relative: 1.2 % (ref 0.0–3.0)
Eosinophils Absolute: 0.2 10*3/uL (ref 0.0–0.7)
Eosinophils Relative: 3.8 % (ref 0.0–5.0)
HCT: 37.5 % (ref 36.0–46.0)
Hemoglobin: 12.3 g/dL (ref 12.0–15.0)
Lymphocytes Relative: 34.9 % (ref 12.0–46.0)
Lymphs Abs: 2.2 10*3/uL (ref 0.7–4.0)
MCHC: 33 g/dL (ref 30.0–36.0)
MCV: 79.6 fl (ref 78.0–100.0)
Monocytes Absolute: 0.4 10*3/uL (ref 0.1–1.0)
Monocytes Relative: 6 % (ref 3.0–12.0)
Neutro Abs: 3.4 10*3/uL (ref 1.4–7.7)
Neutrophils Relative %: 54.1 % (ref 43.0–77.0)
Platelets: 299 10*3/uL (ref 150.0–400.0)
RBC: 4.71 Mil/uL (ref 3.87–5.11)
RDW: 14.8 % (ref 11.5–15.5)
WBC: 6.3 10*3/uL (ref 4.0–10.5)

## 2020-05-16 LAB — COMPREHENSIVE METABOLIC PANEL
ALT: 33 U/L (ref 0–35)
AST: 27 U/L (ref 0–37)
Albumin: 4.5 g/dL (ref 3.5–5.2)
Alkaline Phosphatase: 55 U/L (ref 39–117)
BUN: 16 mg/dL (ref 6–23)
CO2: 28 mEq/L (ref 19–32)
Calcium: 10.1 mg/dL (ref 8.4–10.5)
Chloride: 101 mEq/L (ref 96–112)
Creatinine, Ser: 0.76 mg/dL (ref 0.40–1.20)
GFR: 74.26 mL/min (ref >60.00)
Glucose, Bld: 140 mg/dL — ABNORMAL HIGH (ref 70–99)
Potassium: 4.2 mEq/L (ref 3.5–5.1)
Sodium: 139 mEq/L (ref 135–145)
Total Bilirubin: 0.5 mg/dL (ref 0.2–1.2)
Total Protein: 7 g/dL (ref 6.0–8.3)

## 2020-05-16 LAB — TSH: TSH: 2.5 u[IU]/mL (ref 0.35–4.50)

## 2020-05-16 LAB — C-REACTIVE PROTEIN: CRP: <1 mg/dL (ref 0.5–20.0)

## 2020-05-16 LAB — CK: Total CK: 79 U/L (ref 7–177)

## 2020-05-16 MED ORDER — TRAMADOL HCL 50 MG PO TABS: 50.0000 mg | ORAL_TABLET | Freq: Three times a day (TID) | ORAL | 0 refills | Status: AC | PRN

## 2020-05-16 MED ORDER — TIZANIDINE HCL 2 MG PO TABS: 2.0000 mg | ORAL_TABLET | Freq: Three times a day (TID) | ORAL | 0 refills | Status: DC | PRN

## 2020-05-16 NOTE — Progress Notes (Signed)
Patient: Teresa Rice MRN: [id number] DOB: Feb 16, 1946 PCP: Orma Flaming, MD     Subjective:  Chief Complaint  Patient presents with  . Foot Pain  . Leg Pain  . Medication Refill    Tramadol    HPI: The patient is a 74 y.o. female who presents today for foot/leg pain starting last week. She keeps getting muscle muscle spams in her calves and pain in her legs and feet. I increased her trulicity to 3mg , but she states she doesn't think this is the issue.  She states she drinks plenty of water and stretches before she goes to bed. Both of her legs hurt from her thighs down to her feet.  She denies any issues in her back/pain or injury to this area. She has no numbness or tingling down her legs. Will have weakness at times. She states she has not taken anything. She states ever since she fell out of bed she has hurt. She is stiff in the AM and has some neck issues as well. Tramadol does take the pain away completely. She never had these issues until she fell off the bed in February.   She also tells me she will walk and will have to sit down to rest because her legs will start hurting. Legs do feel a little better if she rests, but not all of the time. Legs do feel good when she lays down on the couch, but when she gets up they start all over again. She state her toes have looked blue to her in the past, but it went away and now it comes and goes.   emgs have been done in the past which were normal per patient.  Per records 07/2019: no evidence of a large fiber sensorimotor polyneuropathy affecting the lower extremities.   Review of Systems  Allergies Patient is allergic to celebrex [celecoxib], glipizide, penicillins, and sulfa antibiotics.  Past Medical History Patient  has a past medical history of Bladder cancer ([place]) (first dx 2012), Chronic constipation, Full dentures, GERD (gastroesophageal reflux disease), Hyperlipidemia, Hypertension, Mild intermittent asthma, OA  (osteoarthritis), Trigger finger of both hands, and Type 2 diabetes mellitus ([place]).  Surgical History Patient  has a past surgical history that includes Bladder surgery (2012   in [facility]); Cholecystectomy (1970); Transurethral resection of bladder tumor (N/A, 05/24/2014); Cystoscopy/retrograde/ureteroscopy (Left, 05/24/2014); and Transurethral resection of bladder tumor (N/A, 08/04/2015).  Family History Pateint's family history includes AAA (abdominal aortic aneurysm) in her sister; Cancer in her brother and father; Diabetes in her brother; Early death in her mother.  Social History Patient  reports that she quit smoking about 26 years ago. Her smoking use included cigarettes. She quit after 30.00 years of use. She has never used smokeless tobacco. She reports current alcohol use. She reports that she does not use drugs.    Objective: Vitals:   05/16/20 1126  BP: 130/80  Pulse: 89  Temp: 97.8 F (36.6 C)  TempSrc: Temporal  SpO2: 95%  Weight: 172 lb 12.8 oz (78.4 kg)  Height: 5\' 4"  (1.626 m)    Body mass index is 29.66 kg/m.  Physical Exam Vitals reviewed.  Constitutional:      Appearance: Normal appearance. She is normal weight.  HENT:     Head: Normocephalic and atraumatic.  Cardiovascular:     Rate and Rhythm: Normal rate and regular rhythm.     Pulses: Normal pulses.     Heart sounds: Normal heart sounds.     Comments:  Pedal pulses wnl  Pulmonary:     Effort: Pulmonary effort is normal.     Breath sounds: Normal breath sounds.  Abdominal:     General: Abdomen is flat. Bowel sounds are normal.     Palpations: Abdomen is soft.  Musculoskeletal:     Comments: Straight leg test negative.  ttp over lower legs bilaterally. No edema/erythema  Strength 5/5 in bilateral lower legs. Sensation intact   Skin:    General: Skin is warm.     Capillary Refill: Capillary refill takes less than 2 seconds.  Neurological:     General: No focal deficit present.     Mental Status:  She is alert and oriented to person, place, and time.     Sensory: No sensory deficit.     Motor: No weakness.     Gait: Gait normal.     Deep Tendon Reflexes: Reflexes normal.        Assessment/plan: 1. Arthralgia, unspecified joint Large differential including PMR, back issues, muscle cramping, OA, RLS, etc.  Checking labs, trial of tizanidine, tramadol and back xray. Also checking ABI for any PVD/arterial disease since some symptoms of claudication.  - CBC with Differential/Platelet - Comprehensive metabolic panel - TSH - Rheumatoid factor - CK - Sedimentation rate - C-reactive protein  2. Leg pain, bilateral See above.  - DG Lumbar Spine Complete; Future - VAS [place] ABI WITH/WO TBI; Future   This visit occurred during the SARS-CoV-2 public health emergency.  Safety protocols were in place, including screening questions prior to the visit, additional usage of staff PPE, and extensive cleaning of exam room while observing appropriate contact time as indicated for disinfecting solutions.     Return if symptoms worsen or fail to improve.   Orma Flaming, MD [place]   05/16/2020

## 2020-05-16 NOTE — Patient Instructions (Addendum)
I have ordered xrays for you. At this time we do not have xrays in our clinic. You will have to go to our [place] clinic. The address is [address]  xray is located in the basement.  Hours of operation are M-F 8:30am to 5:00pm.  Closed for lunch between 12:30 and 1:00pm.   1) lots of labs today. Also xray of your back.  2) sent in tramadol for you and a different muscle relaxer to try.  3) LARGE differential so may take some time for sort this out.   Talk soon once labs are back.   Dr. Rogers Blocker

## 2020-05-17 ENCOUNTER — Ambulatory Visit (INDEPENDENT_AMBULATORY_CARE_PROVIDER_SITE_OTHER)
Admission: RE | Admit: 2020-05-17 | Discharge: 2020-05-17 | Disposition: A | Discharge status: Home / Self Care | Payer: Medicare Other | Source: Ambulatory Visit | Attending: Family Medicine | Admitting: Family Medicine

## 2020-05-17 ENCOUNTER — Encounter (INDEPENDENT_AMBULATORY_CARE_PROVIDER_SITE_OTHER): Payer: Self-pay | Admitting: Family Medicine

## 2020-05-17 DIAGNOSIS — H5340 Unspecified visual field defects: Secondary | ICD-10-CM | POA: Diagnosis not present

## 2020-05-17 DIAGNOSIS — E119 Type 2 diabetes mellitus without complications: Secondary | ICD-10-CM | POA: Diagnosis not present

## 2020-05-17 DIAGNOSIS — M545 Low back pain: Secondary | ICD-10-CM | POA: Diagnosis not present

## 2020-05-17 DIAGNOSIS — M79605 Pain in left leg: Secondary | ICD-10-CM | POA: Diagnosis not present

## 2020-05-17 DIAGNOSIS — M79604 Pain in right leg: Secondary | ICD-10-CM

## 2020-05-17 LAB — HM DIABETES EYE EXAM

## 2020-05-17 LAB — RHEUMATOID FACTOR: Rheumatoid fact SerPl-aCnc: <14 IU/mL (ref <14)

## 2020-05-18 ENCOUNTER — Telehealth: Payer: Self-pay | Admitting: Family Medicine

## 2020-05-18 NOTE — Telephone Encounter (Signed)
Yes,  I understand this but we just checked her a1c in June and can not recheck for 3 months time. She will be due in September for recheck.  Orma Flaming, MD [place]

## 2020-05-18 NOTE — Telephone Encounter (Signed)
Patient notified

## 2020-05-18 NOTE — Telephone Encounter (Signed)
I spoke with pt to confirm that she just had Hgb A1C testing done, and Trulicity was increased. I advised her to continue to work on diet and exercise. She says that in order for her cataract surgery to be improved she needs to be below her last reading 7.6 (done in the office). I prompted her to follow up in 3 months for recheck.

## 2020-05-18 NOTE — Telephone Encounter (Signed)
Patient is calling in this morning stating she went to her eye doctor yesterday and because her last a1c was in the 7's the doctor told her that she will need to have it re-checked or else he will not remove the cataracts in July.

## 2020-05-23 ENCOUNTER — Telehealth: Payer: Self-pay

## 2020-05-23 ENCOUNTER — Other Ambulatory Visit: Payer: Self-pay | Admitting: Family Medicine

## 2020-05-23 MED ORDER — METHOCARBAMOL 500 MG PO TABS: 500.0000 mg | ORAL_TABLET | Freq: Three times a day (TID) | ORAL | 0 refills | Status: DC | PRN

## 2020-05-23 MED ORDER — TRULICITY 3 MG/0.5ML ~~LOC~~ SOAJ: 3.0000 mg | SUBCUTANEOUS | 3 refills | Status: DC

## 2020-05-23 NOTE — Addendum Note (Signed)
Addended by: Orma Flaming on: 05/23/2020 03:42 PM   Modules accepted: Orders

## 2020-05-23 NOTE — Telephone Encounter (Signed)
Okay to send 

## 2020-05-23 NOTE — Telephone Encounter (Signed)
Pharmacy called requesting the Trulicity to be 3 mg instead of 1.5mg , because insurance does not cover the 1.5mg .

## 2020-05-23 NOTE — Telephone Encounter (Signed)
SentOrma Flaming, MD [place]

## 2020-05-25 ENCOUNTER — Other Ambulatory Visit: Payer: Self-pay

## 2020-05-25 ENCOUNTER — Ambulatory Visit (INDEPENDENT_AMBULATORY_CARE_PROVIDER_SITE_OTHER): Payer: Medicare Other | Admitting: Psychiatry

## 2020-05-25 ENCOUNTER — Encounter: Payer: Self-pay | Admitting: Psychiatry

## 2020-05-25 DIAGNOSIS — F4001 Agoraphobia with panic disorder: Secondary | ICD-10-CM

## 2020-05-25 DIAGNOSIS — F4310 Post-traumatic stress disorder, unspecified: Secondary | ICD-10-CM

## 2020-05-25 DIAGNOSIS — F5105 Insomnia due to other mental disorder: Secondary | ICD-10-CM

## 2020-05-25 MED ORDER — ALPRAZOLAM 1 MG PO TABS: ORAL_TABLET | ORAL | 5 refills | Status: DC

## 2020-05-25 NOTE — Progress Notes (Signed)
Teresa Rice [id number] [birth year] 74 y.o.  Subjective:   Patient ID:  Teresa Rice is a 74 y.o. (DOB 1946-10-29) female.  Chief Complaint:  Chief Complaint  Patient presents with  . Follow-up  . Stress  . Anxiety    HPI Teresa Rice presents to the office today for follow-up of anxiety.    Last seen Dec 2020 without med changes though she had previously stopped SSRI AMA.  MD had concerns over LT panic,  05/25/20 appt with the following noted: No vaccine. [place] out of bed.  Hurt herself.  Needs eye surgery and worry about it and DM.  Fights off depression and anxiety.  Feels stressed.   No caffeine.  Not aware if something bothering her.  No full panic.  Not drowsy daytime. .  Also falling asleep on the couch though she doesn't want to do it.  Chest gets tight with anxiety.  No weight loss off Paxil but no weight gain either.  Overall is not worse still and pleased with that.  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  Still doing well with the Xanax. Usually 1/2 mg TID and 1 mg HS.   New PCP Teresa Rice, [place]  Past Psychiatric Medication Trials:  Paxil 60, trazodone, sertraline forgetfulness She has been under our care since 1998 and has been on the same dosage of Xanax the whole time and most of the time took paroxetine 60   Review of Systems:  Review of Systems  HENT: Positive for hearing loss and voice change.   Respiratory: Positive for chest tightness.   Musculoskeletal: Positive for back pain.  Neurological: Negative for tremors and weakness.  Psychiatric/Behavioral: Negative for agitation, behavioral problems, confusion, decreased concentration, dysphoric mood, hallucinations, self-injury, sleep disturbance and suicidal ideas. The patient is nervous/anxious. The patient is not hyperactive.     Medications: I have reviewed the patient's current medications.  Current Outpatient Medications  Medication Sig Dispense  Refill  . Accu-Chek Softclix Lancets lancets     . ALPRAZolam (XANAX) 1 MG tablet 1/2 tablet TID and 1 at night 75 tablet 5  . aspirin EC 81 MG tablet Take 81 mg by mouth daily.    . blood glucose meter kit and supplies KIT Dispense based on patient and insurance preference. Use up to four times daily as directed. (FOR ICD-9 250.00, 250.01). 1 each 0  . Blood Glucose Monitoring Suppl (ACCU-CHEK AVIVA PLUS) w/Device KIT     . budesonide-formoterol (SYMBICORT) 160-4.5 MCG/ACT inhaler Inhale 2 puffs into the lungs 2 (two) times daily. 3 Inhaler 4  . Calcium Carbonate-Vitamin D 600-400 MG-UNIT tablet Take by mouth.    . cholecalciferol (VITAMIN D3) 25 MCG (1000 UNIT) tablet Take 1,000 Units by mouth daily.    . Dulaglutide (TRULICITY) 3 VV/6.1YW SOPN Inject 0.5 mLs (3 mg total) into the skin once a week. 4 pen 3  . fenofibrate 160 MG tablet Take 160 mg by mouth daily.    . fluticasone (FLONASE) 50 MCG/ACT nasal spray Place 2 sprays into both nostrils daily. 16 g 6  . furosemide (LASIX) 20 MG tablet TAKE 1 TABLET(20 MG) BY MOUTH DAILY 90 tablet 0  . glucose blood test strip Use as instructed 100 each 12  . levothyroxine (SYNTHROID) 25 MCG tablet Take 1 tablet (25 mcg total) by mouth daily before breakfast. 90 tablet 1  . lisinopril (ZESTRIL) 40 MG tablet TAKE 1 TABLET(40 MG) BY MOUTH DAILY 90 tablet 1  .  magnesium oxide (MAG-OX) 400 MG tablet Take 400 mg by mouth 2 (two) times daily.    . methocarbamol (ROBAXIN) 500 MG tablet Take 1 tablet (500 mg total) by mouth 3 (three) times daily as needed for muscle spasms. 30 tablet 0  . Multiple Vitamin (MULTIVITAMIN WITH MINERALS) TABS tablet Take 1 tablet by mouth daily.    . Omega 3 1000 MG CAPS Take 1,000 mg by mouth daily.    Marland Kitchen omeprazole (PRILOSEC) 20 MG capsule Take 1 capsule (20 mg total) by mouth daily. 90 capsule 1  . Polyethylene Glycol 3350 (MIRALAX PO) Take by mouth every evening.    . rosuvastatin (CRESTOR) 10 MG tablet Take 1 tablet (10 mg  total) by mouth daily. 90 tablet 1  . sitaGLIPtin-metformin (JANUMET) 50-1000 MG tablet TAKE 1 TABLET BY MOUTH TWICE DAILY WITH A MEAL 180 tablet 1  . VITAMIN E PO Take by mouth.     No current facility-administered medications for this visit.    Medication Side Effects: None  Allergies:  Allergies  Allergen Reactions  . Celebrex [Celecoxib] Anaphylaxis  . Glipizide Itching  . Penicillins Other (See Comments)    "Burn marks from inside out"   . Sulfa Antibiotics Itching    Past Medical History:  Diagnosis Date  . Bladder cancer [facility]) first dx 2012   Recurrent Bladder Cancer--  (urologist-  dr Diona Fanti)  . Chronic constipation   . Full dentures   . GERD (gastroesophageal reflux disease)   . Hyperlipidemia   . Hypertension   . Mild intermittent asthma   . OA (osteoarthritis)    fingers   . Trigger finger of both hands    wear slints and special gloves at night  . Type 2 diabetes mellitus (HCC)     Family History  Problem Relation Age of Onset  . Early death Mother   . AAA (abdominal aortic aneurysm) Sister   . Cancer Father   . Diabetes Brother   . Cancer Brother     Social History   Socioeconomic History  . Marital status: Widowed    Spouse name: Not on file  . Number of children: 1  . Years of education: 41  . Highest education level: Not on file  Occupational History  . Occupation: retired  Tobacco Use  . Smoking status: Former Smoker    Years: 30.00    Types: Cigarettes    Quit date: 05/21/1994    Years since quitting: 26.0  . Smokeless tobacco: Never Used  Vaping Use  . Vaping Use: Never used  Substance and Sexual Activity  . Alcohol use: Yes    Comment: RARE  . Drug use: No  . Sexual activity: Not on file  Other Topics Concern  . Not on file  Social History Narrative   Lives in an apartment, lives alone   One daughter   Right handed    Retired from [employer] work   [employer] level of education:  GED   Social Determinants of Adult nurse Strain:   . Difficulty of Paying Living Expenses:   Food Insecurity:   . Worried About Charity fundraiser in the Last Year:   . Arboriculturist in the Last Year:   Transportation Needs:   . Film/video editor (Medical):   Marland Kitchen Lack of Transportation (Non-Medical):   Physical Activity:   . Days of Exercise per Week:   . Minutes of Exercise per Session:   Stress:   .  Feeling of Stress :   Social Connections:   . Frequency of Communication with Friends and Family:   . Frequency of Social Gatherings with Friends and Family:   . Attends Religious Services:   . Active Member of Clubs or Organizations:   . Attends Archivist Meetings:   Marland Kitchen Marital Status:   Intimate Partner Violence:   . Fear of Current or Ex-Partner:   . Emotionally Abused:   Marland Kitchen Physically Abused:   . Sexually Abused:     Past Medical History, Surgical history, Social history, and Family history were reviewed and updated as appropriate.   2 new hearing aids.  Please see review of systems for further details on the patient's review from today.   Objective:   Physical Exam:  LMP  (LMP Unknown)   Physical Exam Constitutional:      General: She is not in acute distress.    Appearance: She is well-developed.  Musculoskeletal:        General: No deformity.  Neurological:     Mental Status: She is alert and oriented to person, place, and time.     Motor: No tremor.     Coordination: Coordination normal.     Gait: Gait normal.  Psychiatric:        Attention and Perception: Attention normal. She does not perceive auditory hallucinations.        Mood and Affect: Mood is anxious. Mood is not depressed. Affect is not labile, blunt, angry or inappropriate.        Speech: Speech normal.        Behavior: Behavior normal.        Thought Content: Thought content normal. Thought content does not include homicidal or suicidal ideation. Thought content does not include homicidal or  suicidal plan.        Cognition and Memory: Cognition normal.        Judgment: Judgment normal.     Comments: Insight fair.   No auditory or visual hallucinations. No delusions.      Lab Review:     Component Value Date/Time   NA 139 05/16/2020 1202   K 4.2 05/16/2020 1202   CL 101 05/16/2020 1202   CO2 28 05/16/2020 1202   GLUCOSE 140 (H) 05/16/2020 1202   BUN 16 05/16/2020 1202   CREATININE 0.76 05/16/2020 1202   CALCIUM 10.1 05/16/2020 1202   PROT 7.0 05/16/2020 1202   ALBUMIN 4.5 05/16/2020 1202   AST 27 05/16/2020 1202   ALT 33 05/16/2020 1202   ALKPHOS 55 05/16/2020 1202   BILITOT 0.5 05/16/2020 1202   GFRNONAA 87 (L) 05/24/2014 0620   GFRAA >90 05/24/2014 0620       Component Value Date/Time   WBC 6.3 05/16/2020 1202   RBC 4.71 05/16/2020 1202   HGB 12.3 05/16/2020 1202   HCT 37.5 05/16/2020 1202   PLT 299.0 05/16/2020 1202   MCV 79.6 05/16/2020 1202   MCH 26.2 05/24/2014 0620   MCHC 33.0 05/16/2020 1202   RDW 14.8 05/16/2020 1202   LYMPHSABS 2.2 05/16/2020 1202   MONOABS 0.4 05/16/2020 1202   EOSABS 0.2 05/16/2020 1202   BASOSABS 0.1 05/16/2020 1202    No results found for: POCLITH, LITHIUM   No results found for: PHENYTOIN, PHENOBARB, VALPROATE, CBMZ   .res Assessment: Plan:    Iline was seen today for follow-up, stress and anxiety.  Diagnoses and all orders for this visit:  Panic disorder with agoraphobia -  ALPRAZolam (XANAX) 1 MG tablet; 1/2 tablet TID and 1 at night  PTSD (post-traumatic stress disorder) -     ALPRAZolam (XANAX) 1 MG tablet; 1/2 tablet TID and 1 at night  Insomnia due to mental condition   Overall doing well as she can.  Chronic avoidance and general fearfulness at baseline. Chronically easily stressed.  Sleep hygiene in detail.  Normalized some of the problem.  She's getting enough sleep.  Delayed sleep phase is improved from last visit.  Insomnia is better.    Rec exercise.  Went off SSRI AMA DT weight gain.   Has done ok off it but no weight loss.  Still says no worse off it.  No med changes indicated.  Does not think she can tolerate anxiety without Xanax.  A lot of benefit and no SE.  We discussed the short-term risks associated with benzodiazepines including sedation and increased fall risk among others.  Discussed long-term side effect risk including dependence, potential withdrawal symptoms, and the potential eventual dose-related risk of dementia.  But recent studies from 2020 dispute this association between benzodiazepines and dementia risk. Newer studies in 2020 do not support an association with dementia.  Satisfied with Xanax now..  Already at significant dose for age.  She has kept benefit.  Tolerated well.  FU 6 mos.  Lynder Parents, MD, DFAPA   Please see After Visit Summary for patient specific instructions.  Future Appointments  Date Time Provider [place]  08/10/2020  8:20 AM Orma Flaming, MD [facility]    No orders of the defined types were placed in this encounter.     -------------------------------

## 2020-06-03 ENCOUNTER — Encounter: Payer: Self-pay | Admitting: Family Medicine

## 2020-06-05 ENCOUNTER — Other Ambulatory Visit: Payer: Self-pay | Admitting: Family Medicine

## 2020-06-06 ENCOUNTER — Encounter: Payer: Self-pay | Admitting: Family Medicine

## 2020-06-07 NOTE — Telephone Encounter (Signed)
Please Advise

## 2020-06-08 ENCOUNTER — Other Ambulatory Visit: Payer: Self-pay | Admitting: Family Medicine

## 2020-06-08 DIAGNOSIS — E119 Type 2 diabetes mellitus without complications: Secondary | ICD-10-CM

## 2020-06-13 ENCOUNTER — Encounter: Payer: Self-pay | Admitting: Family Medicine

## 2020-06-13 ENCOUNTER — Other Ambulatory Visit (INDEPENDENT_AMBULATORY_CARE_PROVIDER_SITE_OTHER): Payer: Medicare Other

## 2020-06-13 ENCOUNTER — Other Ambulatory Visit: Payer: Self-pay

## 2020-06-13 DIAGNOSIS — E119 Type 2 diabetes mellitus without complications: Secondary | ICD-10-CM | POA: Diagnosis not present

## 2020-06-13 LAB — HEMOGLOBIN A1C: Hgb A1c MFr Bld: 7.7 % — ABNORMAL HIGH (ref 4.6–6.5)

## 2020-06-17 ENCOUNTER — Other Ambulatory Visit: Payer: Self-pay | Admitting: Family Medicine

## 2020-06-17 DIAGNOSIS — G8929 Other chronic pain: Secondary | ICD-10-CM

## 2020-06-20 ENCOUNTER — Telehealth: Payer: Self-pay

## 2020-06-20 DIAGNOSIS — H35033 Hypertensive retinopathy, bilateral: Secondary | ICD-10-CM | POA: Diagnosis not present

## 2020-06-20 DIAGNOSIS — H5340 Unspecified visual field defects: Secondary | ICD-10-CM | POA: Diagnosis not present

## 2020-06-20 MED ORDER — TRAMADOL HCL 50 MG PO TABS: 50.0000 mg | ORAL_TABLET | Freq: Three times a day (TID) | ORAL | 0 refills | Status: AC | PRN

## 2020-06-20 NOTE — Addendum Note (Signed)
Addended by: Vivi Barrack on: 06/20/2020 12:14 PM   Modules accepted: Orders

## 2020-06-20 NOTE — Telephone Encounter (Signed)
Received fax from [facility] requesting refill for TRAMADOL 50 mg tablets. Medication last filled on 05/21/20, in historical med list, but not listed under current medications. Please advise if refill can be sent in.

## 2020-06-20 NOTE — Telephone Encounter (Signed)
Please review

## 2020-06-23 DIAGNOSIS — H25013 Cortical age-related cataract, bilateral: Secondary | ICD-10-CM | POA: Diagnosis not present

## 2020-06-23 DIAGNOSIS — H18413 Arcus senilis, bilateral: Secondary | ICD-10-CM | POA: Diagnosis not present

## 2020-06-23 DIAGNOSIS — H25043 Posterior subcapsular polar age-related cataract, bilateral: Secondary | ICD-10-CM | POA: Diagnosis not present

## 2020-06-23 DIAGNOSIS — H2513 Age-related nuclear cataract, bilateral: Secondary | ICD-10-CM | POA: Diagnosis not present

## 2020-06-23 DIAGNOSIS — H2512 Age-related nuclear cataract, left eye: Secondary | ICD-10-CM | POA: Diagnosis not present

## 2020-07-05 ENCOUNTER — Other Ambulatory Visit: Payer: Self-pay | Admitting: Family Medicine

## 2020-07-06 DIAGNOSIS — H2512 Age-related nuclear cataract, left eye: Secondary | ICD-10-CM | POA: Diagnosis not present

## 2020-07-07 DIAGNOSIS — H2511 Age-related nuclear cataract, right eye: Secondary | ICD-10-CM | POA: Diagnosis not present

## 2020-07-07 DIAGNOSIS — H2513 Age-related nuclear cataract, bilateral: Secondary | ICD-10-CM | POA: Diagnosis not present

## 2020-07-20 DIAGNOSIS — H2511 Age-related nuclear cataract, right eye: Secondary | ICD-10-CM | POA: Diagnosis not present

## 2020-07-20 DIAGNOSIS — H2513 Age-related nuclear cataract, bilateral: Secondary | ICD-10-CM | POA: Diagnosis not present

## 2020-07-26 ENCOUNTER — Encounter: Payer: Self-pay | Admitting: Family Medicine

## 2020-07-27 NOTE — Telephone Encounter (Signed)
I spoke with the pt to give the message below. Pt will go to [facility] for testing, and virtual appointment will be made.

## 2020-07-27 NOTE — Telephone Encounter (Signed)
Patient has been scheduled for 9/2 for a virtual visit.

## 2020-07-28 ENCOUNTER — Telehealth: Payer: Medicare Other | Admitting: Family Medicine

## 2020-07-29 ENCOUNTER — Encounter: Payer: Self-pay | Admitting: Family Medicine

## 2020-07-29 ENCOUNTER — Telehealth (INDEPENDENT_AMBULATORY_CARE_PROVIDER_SITE_OTHER): Payer: Medicare Other | Admitting: Family Medicine

## 2020-07-29 ENCOUNTER — Other Ambulatory Visit: Payer: Self-pay

## 2020-07-29 VITALS — Ht 64.0 in | Wt 172.0 lb

## 2020-07-29 DIAGNOSIS — J0100 Acute maxillary sinusitis, unspecified: Secondary | ICD-10-CM | POA: Diagnosis not present

## 2020-07-29 MED ORDER — DOXYCYCLINE HYCLATE 100 MG PO TABS
100.0000 mg | ORAL_TABLET | Freq: Two times a day (BID) | ORAL | 0 refills | Status: DC
Start: 2020-07-29 — End: 2020-09-28

## 2020-07-29 MED ORDER — PREDNISONE 20 MG PO TABS
40.0000 mg | ORAL_TABLET | Freq: Every day | ORAL | 0 refills | Status: DC
Start: 2020-07-29 — End: 2020-08-10

## 2020-07-29 NOTE — Progress Notes (Signed)
Patient: Teresa Rice MRN: [id number] DOB: Jun 27, 1946 PCP: Orma Flaming, MD     I connected with Bynum Bellows on 07/29/20 at 11:45am by a video enabled telemedicine application and verified that I am speaking with the correct person using two identifiers.  Location patient: Home Location provider: [facility], Office Persons participating in this virtual visit: Eulas Post and Dr. Rogers Blocker   I discussed the limitations of evaluation and management by telemedicine and the availability of in person appointments. The patient expressed understanding and agreed to proceed.   Subjective:  Chief Complaint  Patient presents with  . Nasal Congestion    Stuffy nose    HPI: The patient is a 74 y.o. female who presents today for nasal congestion, and cold symptoms. She does have a history of moderate asthma. Symptoms started on Sunday with congestion. She states it was so stuffy that she could not breath. she started to get a cough and small wheeze in her chest, but this has stopped. She has had a lot of sweating and her body hurts and she has a bad headache. She has no shortness of breath or wheezing. She has been using her inhaler. She has had one fever on Tuesday to 99.0. She says that she doesn't think she has Covid, but she is waiting for results from yesterday. She does feel better today. She still has a cough and stuffiness in her nose. She does have ttp over her maxillary sinuses. No pain in teeth. Her headache is gone. She is using her flonase and will take tylenol if needed, but she hasn't need this. Her daughter was not feeling good recently.   Review of Systems  Constitutional: Negative for chills and fever.  HENT: Positive for congestion, sinus pressure and sinus pain. Negative for ear pain and sore throat.   Respiratory: Negative for cough, shortness of breath and wheezing.   Cardiovascular: Negative for chest pain.  Gastrointestinal: Negative for abdominal pain, nausea and  vomiting.  Musculoskeletal: Negative for arthralgias.  Neurological: Negative for dizziness and headaches.    Allergies Patient is allergic to celebrex [celecoxib], glipizide, penicillins, and sulfa antibiotics.  Past Medical History Patient  has a past medical history of Bladder cancer ([place]) (first dx 2012), Chronic constipation, Full dentures, GERD (gastroesophageal reflux disease), Hyperlipidemia, Hypertension, Mild intermittent asthma, OA (osteoarthritis), Trigger finger of both hands, and Type 2 diabetes mellitus ([address]).  Surgical History Patient  has a past surgical history that includes Bladder surgery (2012   in [facility]); Cholecystectomy (1970); Transurethral resection of bladder tumor (N/A, 05/24/2014); Cystoscopy/retrograde/ureteroscopy (Left, 05/24/2014); and Transurethral resection of bladder tumor (N/A, 08/04/2015).  Family History Pateint's family history includes AAA (abdominal aortic aneurysm) in her sister; Cancer in her brother and father; Diabetes in her brother; Early death in her mother.  Social History Patient  reports that she quit smoking about 26 years ago. Her smoking use included cigarettes. She quit after 30.00 years of use. She has never used smokeless tobacco. She reports current alcohol use. She reports that she does not use drugs.    Objective: Vitals:   07/29/20 1127  Weight: 172 lb (78 kg)  Height: 5\' 4"  (1.626 m)    Body mass index is 29.52 kg/m.  Physical Exam Vitals reviewed.  Constitutional:      General: She is not in acute distress.    Appearance: Normal appearance. She is obese. She is not ill-appearing.  HENT:     Head: Normocephalic and atraumatic.  Nose:     Comments: TTP over maxillary sinuses when she presses  Pulmonary:     Effort: Pulmonary effort is normal.  Neurological:     General: No focal deficit present.     Mental Status: She is alert and oriented to person, place, and time.        Assessment/plan: 1. Acute  maxillary sinusitis, recurrence not specified Course of doxy since allergy to pcn and burst of steroids. I still wonder if she has covid, but test is pending. Doxy and steroids will help and if positive for covid will send for infusion. She will let me know. Number given for infusion in case she gets test results over the weekend. precautions given.     Return if symptoms worsen or fail to improve.     Orma Flaming, MD [place]  07/29/2020

## 2020-07-29 NOTE — Telephone Encounter (Signed)
Patient states she was tested at a [facility] clinic in [facility] yesterday for Teresa Rice. States results came back this evening. States results were negative. Will have daughter send results through [place].

## 2020-07-31 ENCOUNTER — Encounter: Payer: Self-pay | Admitting: Family Medicine

## 2020-08-02 NOTE — Telephone Encounter (Signed)
FYI

## 2020-08-10 ENCOUNTER — Other Ambulatory Visit: Payer: Self-pay

## 2020-08-10 ENCOUNTER — Ambulatory Visit (INDEPENDENT_AMBULATORY_CARE_PROVIDER_SITE_OTHER): Payer: Medicare Other | Admitting: Family Medicine

## 2020-08-10 ENCOUNTER — Telehealth: Payer: Self-pay | Admitting: Family Medicine

## 2020-08-10 ENCOUNTER — Encounter: Payer: Self-pay | Admitting: Family Medicine

## 2020-08-10 VITALS — BP 118/80 | HR 103 | Temp 96.8°F | Ht 64.0 in | Wt 168.2 lb

## 2020-08-10 DIAGNOSIS — Z23 Encounter for immunization: Secondary | ICD-10-CM

## 2020-08-10 DIAGNOSIS — E119 Type 2 diabetes mellitus without complications: Secondary | ICD-10-CM

## 2020-08-10 MED ORDER — TRULICITY 3 MG/0.5ML ~~LOC~~ SOAJ: 3.0000 mg | SUBCUTANEOUS | 1 refills | Status: DC

## 2020-08-10 MED ORDER — ROSUVASTATIN CALCIUM 10 MG PO TABS: 10.0000 mg | ORAL_TABLET | Freq: Every day | ORAL | 1 refills | Status: DC

## 2020-08-10 NOTE — Progress Notes (Signed)
Patient: Teresa Rice MRN: [id number] DOB: 01/27/1946 PCP: Orma Flaming, MD     Subjective:  Chief Complaint  Patient presents with  . Diabetes    HPI: The patient is a 74 y.o. female who presents today for Diabetes. She says that she feels a lot better.   Diabetes: Patient is here for follow up of type 2 diabetes. First diagnosed 10.  Currently on the following medications janumet (50-100mg  bid) and trulicity 3mg /week. Takes medications as prescribed. Last A1C was 7.7. Currently exercising and following diabetic diet. Sugars range from 120 to 140. Denies any hypoglycemic events. Denies any vision changes, nausea, vomiting, abdominal pain, ulcers/paraesthesia in feet, polyuria, polydipsia or polyphagia. Denies any chest pain, shortness of breath.   Review of Systems  HENT: Positive for congestion. Negative for sore throat.   Respiratory: Negative for shortness of breath and wheezing.   Cardiovascular: Negative for chest pain and palpitations.  Gastrointestinal: Negative for abdominal pain, diarrhea and nausea.  Neurological: Negative for dizziness and headaches.    Allergies Patient is allergic to celebrex [celecoxib], glipizide, penicillins, and sulfa antibiotics.  Past Medical History Patient  has a past medical history of Bladder cancer ([place]) (first dx 2012), Chronic constipation, Full dentures, GERD (gastroesophageal reflux disease), Hyperlipidemia, Hypertension, Mild intermittent asthma, OA (osteoarthritis), Trigger finger of both hands, and Type 2 diabetes mellitus ([place]).  Surgical History Patient  has a past surgical history that includes Bladder surgery (2012   in [facility]); Cholecystectomy (1970); Transurethral resection of bladder tumor (N/A, 05/24/2014); Cystoscopy/retrograde/ureteroscopy (Left, 05/24/2014); and Transurethral resection of bladder tumor (N/A, 08/04/2015).  Family History Pateint's family history includes AAA (abdominal aortic aneurysm) in her  sister; Cancer in her brother and father; Diabetes in her brother; Early death in her mother.  Social History Patient  reports that she quit smoking about 26 years ago. Her smoking use included cigarettes. She quit after 30.00 years of use. She has never used smokeless tobacco. She reports current alcohol use. She reports that she does not use drugs.    Objective: Vitals:   08/10/20 0834  BP: 118/80  Pulse: (!) 103  Temp: (!) 96.8 F (36 C)  TempSrc: Temporal  SpO2: 99%  Weight: 168 lb 3.2 oz (76.3 kg)  Height: 5\' 4"  (1.626 m)    Body mass index is 28.87 kg/m.  Physical Exam Vitals reviewed.  Constitutional:      Appearance: Normal appearance. She is obese.  HENT:     Head: Normocephalic and atraumatic.     Comments: ttp over maxillary sinuses     Nose: Nose normal.     Mouth/Throat:     Comments: Mild cobblestoning on posterior pharynx  Cardiovascular:     Rate and Rhythm: Normal rate and regular rhythm.  Pulmonary:     Effort: Pulmonary effort is normal.     Breath sounds: Normal breath sounds.  Abdominal:     General: Bowel sounds are normal.     Palpations: Abdomen is soft.  Musculoskeletal:     Cervical back: Normal range of motion and neck supple.  Neurological:     General: No focal deficit present.     Mental Status: She is alert and oriented to person, place, and time.  Psychiatric:        Mood and Affect: Mood normal.        Behavior: Behavior normal.        Assessment/plan: 1. Type 2 diabetes mellitus without complication, without long-term current use of insulin (HCC) -  sugars are much better with increased trulicity and near goal.  -continue trulicity 3mg /week and janumet and depending on a1c will adjust medication as needed.  -pnueumovax and flu today -refilled crestor.    This visit occurred during the SARS-CoV-2 public health emergency.  Safety protocols were in place, including screening questions prior to the visit, additional usage of staff  PPE, and extensive cleaning of exam room while observing appropriate contact time as indicated for disinfecting solutions.     Return in about 3 months (around 11/09/2020) for htn/diabetes and labs .   Orma Flaming, MD [place]   08/10/2020

## 2020-08-10 NOTE — Telephone Encounter (Signed)
Teresa Rice from [employer] patients insurance wanted her to call regarding statin medications and states patient needs to talk to PCP as to why she is currently taking the medications [phone] if we have any questions for Teresa Rice

## 2020-08-10 NOTE — Patient Instructions (Signed)
-  flu and pneumovax shots today -checking a1c and i'll be in touch -f/u in 3 months and we will do all labs at that time.    Look great! Dr. Rogers Blocker

## 2020-08-10 NOTE — Telephone Encounter (Signed)
Im very confused on this message. She is on this because she is a diabetic and per guidelines diabetics should be on a statin drug to reduce increased risk of MI/CVA. She also has an elevated ASCVD risk. I have never seen a message like this before.  Dr. Rogers Blocker

## 2020-08-11 ENCOUNTER — Encounter: Payer: Self-pay | Admitting: Family Medicine

## 2020-08-11 LAB — HEMOGLOBIN A1C
Hgb A1c MFr Bld: 7.8 % of total Hgb — ABNORMAL HIGH (ref <5.7)
Mean Plasma Glucose: 177 (calc)
eAG (mmol/L): 9.8 (calc)

## 2020-08-11 NOTE — Telephone Encounter (Signed)
I was rather confused as well all she stated was she need to know why she was taking medication

## 2020-08-11 NOTE — Telephone Encounter (Signed)
Lvm for Abigail Butts to call the office back.

## 2020-08-12 NOTE — Telephone Encounter (Signed)
Patient has called stating she did not want to take Jardiance.   Is requesting a call back in regard.

## 2020-08-15 DIAGNOSIS — Z03818 Encounter for observation for suspected exposure to other biological agents ruled out: Secondary | ICD-10-CM | POA: Diagnosis not present

## 2020-08-16 ENCOUNTER — Other Ambulatory Visit: Payer: Medicare Other

## 2020-08-21 ENCOUNTER — Encounter: Payer: Self-pay | Admitting: Family Medicine

## 2020-08-22 NOTE — Telephone Encounter (Signed)
I spoke with Wendi from [employer] ( a company that supports [employer]) She verified that she is not from [employer]. She says that she called Ms Sherian Maroon  to go over her medication list and to make sure she is taking her medications right. These calls are from Pharmacist. She says that Ms. Kerper had questions about why she is taking Statins.

## 2020-08-24 MED ORDER — LANTUS SOLOSTAR 100 UNIT/ML ~~LOC~~ SOPN: 10.0000 [IU] | PEN_INJECTOR | Freq: Every day | SUBCUTANEOUS | 1 refills | Status: DC

## 2020-08-25 ENCOUNTER — Other Ambulatory Visit: Payer: Self-pay

## 2020-08-25 ENCOUNTER — Ambulatory Visit: Payer: Medicare Other

## 2020-08-25 ENCOUNTER — Encounter: Payer: Self-pay | Admitting: Family Medicine

## 2020-08-25 ENCOUNTER — Telehealth: Payer: Self-pay

## 2020-08-25 DIAGNOSIS — E119 Type 2 diabetes mellitus without complications: Secondary | ICD-10-CM

## 2020-08-25 MED ORDER — LANTUS SOLOSTAR 100 UNIT/ML ~~LOC~~ SOPN: 10.0000 [IU] | PEN_INJECTOR | Freq: Every day | SUBCUTANEOUS | 1 refills | Status: DC

## 2020-08-25 MED ORDER — PEN NEEDLES 32G X 5 MM MISC: 3 refills | Status: DC

## 2020-08-25 NOTE — Telephone Encounter (Signed)
Sent to pharmacy 

## 2020-08-25 NOTE — Telephone Encounter (Signed)
Patient called in and stated she needed the needles to go with her insulin glargine (LANTUS SOLOSTAR) 100 UNIT/ML Solostar Pen.    [facility] [place], [address] Phone:  [phone]  Fax:  [phone]

## 2020-08-25 NOTE — Progress Notes (Signed)
Pt came in today for insulin education. Pt was given patient education materials. She expressed understanding. She was also told to call the office if she has any questions or concerns.

## 2020-08-26 ENCOUNTER — Telehealth: Payer: Medicare Other | Admitting: Family Medicine

## 2020-08-30 ENCOUNTER — Ambulatory Visit: Payer: Medicare Other

## 2020-09-02 ENCOUNTER — Telehealth (INDEPENDENT_AMBULATORY_CARE_PROVIDER_SITE_OTHER): Payer: Medicare Other | Admitting: Family Medicine

## 2020-09-02 ENCOUNTER — Other Ambulatory Visit: Payer: Self-pay

## 2020-09-02 ENCOUNTER — Encounter: Payer: Self-pay | Admitting: Family Medicine

## 2020-09-02 VITALS — Ht 64.0 in | Wt 168.0 lb

## 2020-09-02 DIAGNOSIS — Z7185 Encounter for immunization safety counseling: Secondary | ICD-10-CM

## 2020-09-02 NOTE — Progress Notes (Signed)
Patient: Teresa Rice MRN: [id number] DOB: [birth year] PCP: Orma Flaming, MD     I connected with Bynum Bellows on 09/02/20 at 11:05am by a video enabled telemedicine application and verified that I am speaking with the correct person using two identifiers.  Location patient: Home Location provider: [facility], Office Persons participating in this virtual visit: Eulas Post and Dr. Rogers Blocker  I discussed the limitations of evaluation and management by telemedicine and the availability of in person appointments. The patient expressed understanding and agreed to proceed.   Subjective:  Chief Complaint  Patient presents with  . Covid Vaccine     Questions    HPI: The patient is a 74 y.o. female who presents today for questions about Covid vaccination. Pt says that she has her 2nd shot next week. She says if she gets sick, she wants to know what to do.   She also has some questions about her insulin. She gets itching sometimes at site where she injects, but she doesn't have this every time. She thinks it is tolerable.   Review of Systems  Respiratory: Negative for chest tightness and shortness of breath.   Cardiovascular: Negative for chest pain and palpitations.  Genitourinary: Negative for frequency and urgency.  Neurological: Negative for light-headedness and headaches.    Allergies Patient is allergic to celebrex [celecoxib], glipizide, penicillins, and sulfa antibiotics.  Past Medical History Patient  has a past medical history of Bladder cancer ([place]) (first dx 2012), Chronic constipation, Full dentures, GERD (gastroesophageal reflux disease), Hyperlipidemia, Hypertension, Mild intermittent asthma, OA (osteoarthritis), Trigger finger of both hands, and Type 2 diabetes mellitus ([place]).  Surgical History Patient  has a past surgical history that includes Bladder surgery (2012   in [facility]); Cholecystectomy (1970); Transurethral resection of bladder tumor (N/A,  05/24/2014); Cystoscopy/retrograde/ureteroscopy (Left, 05/24/2014); and Transurethral resection of bladder tumor (N/A, 08/04/2015).  Family History Pateint's family history includes AAA (abdominal aortic aneurysm) in her sister; Cancer in her brother and father; Diabetes in her brother; Early death in her mother.  Social History Patient  reports that she quit smoking about 26 years ago. Her smoking use included cigarettes. She quit after 30.00 years of use. She has never used smokeless tobacco. She reports current alcohol use. She reports that she does not use drugs.    Objective: Vitals:   09/02/20 1029  Weight: 168 lb (76.2 kg)  Height: 5\' 4"  (1.626 m)    Body mass index is 28.84 kg/m.  Physical Exam Vitals reviewed.  Constitutional:      Appearance: Normal appearance. She is obese.  HENT:     Head: Normocephalic and atraumatic.  Pulmonary:     Effort: Pulmonary effort is normal.  Neurological:     General: No focal deficit present.     Mental Status: She is alert and oriented to person, place, and time.  Psychiatric:        Mood and Affect: Mood normal.        Behavior: Behavior normal.        Assessment/plan: 1. Vaccine counseling Answered all of her questions and concerns regarding second shot. Did fine with first one and discussed she can let me know if any adverse reactions. Answered questions about if she gets covid as well. recommended she continue to wear mask, not go in indoor crowds. Also discussed will take 14 days from second immunization for this to be "full protection."   -answered all questions regarding insulin. Doing well.   Return if  symptoms worsen or fail to improve.    Orma Flaming, MD [place]  09/02/2020

## 2020-09-03 ENCOUNTER — Other Ambulatory Visit: Payer: Self-pay | Admitting: Family Medicine

## 2020-09-03 ENCOUNTER — Other Ambulatory Visit: Payer: Self-pay | Admitting: Physician Assistant

## 2020-09-07 ENCOUNTER — Other Ambulatory Visit: Payer: Self-pay

## 2020-09-20 ENCOUNTER — Other Ambulatory Visit: Payer: Self-pay | Admitting: Family Medicine

## 2020-09-24 ENCOUNTER — Encounter: Payer: Self-pay | Admitting: Family Medicine

## 2020-09-28 ENCOUNTER — Ambulatory Visit (INDEPENDENT_AMBULATORY_CARE_PROVIDER_SITE_OTHER): Payer: Medicare Other | Admitting: Family Medicine

## 2020-09-28 ENCOUNTER — Encounter: Payer: Self-pay | Admitting: Family Medicine

## 2020-09-28 ENCOUNTER — Other Ambulatory Visit: Payer: Self-pay

## 2020-09-28 VITALS — BP 157/83 | HR 103 | Temp 98.0°F | Ht 64.0 in | Wt 169.2 lb

## 2020-09-28 DIAGNOSIS — R059 Cough, unspecified: Secondary | ICD-10-CM

## 2020-09-28 MED ORDER — LOSARTAN POTASSIUM 100 MG PO TABS: 100.0000 mg | ORAL_TABLET | Freq: Every day | ORAL | 3 refills | Status: DC

## 2020-09-28 MED ORDER — BENZONATATE 200 MG PO CAPS: 200.0000 mg | ORAL_CAPSULE | Freq: Two times a day (BID) | ORAL | 1 refills | Status: DC | PRN

## 2020-09-28 NOTE — Patient Instructions (Addendum)
I wonder if your dry cough is due to lisinopril. We are going to stop this drug and start you on another called losartan.   Please start with 1/2 tab a day (50mg ) and keep blood pressure for a week. If over 140/90, go ahead and take full pill. I think you will likely need to full pill. Will see you back in 1 month for recheck.   Also I want you to get an xray as this has been going on for a long time. See below for address.   I have ordered xrays for you. At this time we do not have xrays in our clinic. You will have to go to our [place] clinic. The address is [address]  xray is located in the basement.  Hours of operation are M-F 8:30am to 5:00pm.  Closed for lunch between 12:30 and 1:00pm.    In the meantime im going to send in tessalon pearls for you to take. Continue with flonase at night.   See you in a month! Dr. Rogers Blocker

## 2020-09-28 NOTE — Progress Notes (Signed)
Patient: Teresa Rice MRN: [id number] DOB: [birth year] PCP: Orma Flaming, MD     Subjective:  Chief Complaint  Patient presents with  . Cough    HPI: The patient is a 74 y.o. female who presents today for chronic cough. She says that she has had cough since the 2nd Covid vaccine. She says that she has take Tessalon pearls for cough and seemed to have helped her a lot. She has had a chronic cough and she thinks it went away after her first covid shot, but now it's back. She does have post nasal drainage. She also has had some sinus pain/pressure. She has been using flonase twice a day. She states her cough is dry in nature. She is on lisinopril. She has no shortness of breath and does not think this is an asthma related issue. She has had a cough for at least 2+ months that waxes and wanes.   Review of Systems  Constitutional: Negative for chills, diaphoresis and fever.  HENT: Positive for postnasal drip. Negative for congestion, facial swelling, sinus pressure, sinus pain and sore throat.   Respiratory: Positive for cough. Negative for shortness of breath and wheezing.   Cardiovascular: Negative for chest pain and palpitations.    Allergies Patient is allergic to celebrex [celecoxib], glipizide, penicillins, and sulfa antibiotics.  Past Medical History Patient  has a past medical history of Bladder cancer ([place]) (first dx 2012), Chronic constipation, Full dentures, GERD (gastroesophageal reflux disease), Hyperlipidemia, Hypertension, Mild intermittent asthma, OA (osteoarthritis), Trigger finger of both hands, and Type 2 diabetes mellitus ([place]).  Surgical History Patient  has a past surgical history that includes Bladder surgery (2012   in [facility]); Cholecystectomy (1970); Transurethral resection of bladder tumor (N/A, 05/24/2014); Cystoscopy/retrograde/ureteroscopy (Left, 05/24/2014); and Transurethral resection of bladder tumor (N/A, 08/04/2015).  Family History Pateint's family  history includes AAA (abdominal aortic aneurysm) in her sister; Cancer in her brother and father; Diabetes in her brother; Early death in her mother.  Social History Patient  reports that she quit smoking about 26 years ago. Her smoking use included cigarettes. She quit after 30.00 years of use. She has never used smokeless tobacco. She reports current alcohol use. She reports that she does not use drugs.    Objective: Vitals:   09/28/20 1130  BP: (!) 157/83  Pulse: (!) 103  Temp: 98 F (36.7 C)  TempSrc: Temporal  SpO2: 98%  Weight: 169 lb 3.2 oz (76.7 kg)  Height: 5\' 4"  (1.626 m)    Body mass index is 29.04 kg/m.  Physical Exam Vitals reviewed.  Constitutional:      Appearance: Normal appearance. She is normal weight.  HENT:     Head: Normocephalic and atraumatic.     Right Ear: Tympanic membrane, ear canal and external ear normal.     Left Ear: Tympanic membrane, ear canal and external ear normal.     Nose: Nose normal.     Mouth/Throat:     Mouth: Mucous membranes are moist.     Comments: Minimal cobblestoning  Cardiovascular:     Rate and Rhythm: Normal rate and regular rhythm.     Heart sounds: Normal heart sounds.  Pulmonary:     Effort: No respiratory distress.     Breath sounds: Normal breath sounds. No wheezing or rales.  Abdominal:     General: Bowel sounds are normal.     Palpations: Abdomen is soft.  Skin:    Capillary Refill: Capillary refill takes less than 2  seconds.  Neurological:     General: No focal deficit present.     Mental Status: She is alert and oriented to person, place, and time.  Psychiatric:        Mood and Affect: Mood normal.        Behavior: Behavior normal.        Assessment/plan: 1. Cough Chronic in nature. Does not appear related to her asthma. Stopping lisinopril, checking CXR. Continue flonase at night. Can try tessalon pearls prn. Starting her on losartan 100mg , but starting off at 50mg /day. She is to keep a log and bump  up her losartan to 100mg /day if consistently over 140/90. If cough continues despite stopping ACEi, normal CXR, flonase and inhalers will send to pulmonology.  - DG Chest 2 View; Future    This visit occurred during the SARS-CoV-2 public health emergency.  Safety protocols were in place, including screening questions prior to the visit, additional usage of staff PPE, and extensive cleaning of exam room while observing appropriate contact time as indicated for disinfecting solutions.     Return in about 1 month (around 10/28/2020) for blood pressure check/med check up .    Orma Flaming, MD [place]   09/28/2020

## 2020-09-29 ENCOUNTER — Ambulatory Visit (INDEPENDENT_AMBULATORY_CARE_PROVIDER_SITE_OTHER)
Admission: RE | Admit: 2020-09-29 | Discharge: 2020-09-29 | Disposition: A | Discharge status: Home / Self Care | Payer: Medicare Other | Source: Ambulatory Visit | Attending: Family Medicine | Admitting: Family Medicine

## 2020-09-29 DIAGNOSIS — R059 Cough, unspecified: Secondary | ICD-10-CM

## 2020-09-29 DIAGNOSIS — R053 Chronic cough: Secondary | ICD-10-CM | POA: Diagnosis not present

## 2020-10-10 ENCOUNTER — Other Ambulatory Visit: Payer: Self-pay | Admitting: Family Medicine

## 2020-10-10 MED ORDER — METFORMIN HCL 1000 MG PO TABS: 1000.0000 mg | ORAL_TABLET | Freq: Two times a day (BID) | ORAL | 3 refills | Status: DC

## 2020-10-10 NOTE — Progress Notes (Signed)
Lvm for pt to call the office back to give message below.

## 2020-10-10 NOTE — Progress Notes (Signed)
Teresa Rice, Please let her know im stopping her janument and just continuing her metformin only. I sent in new px of just metformin instead of the combination pill. Pharmacist suggested we do this as we increase her risk of pancreatitis with her being on both the trulicity and the other drug in the [place]. Also not much benefit for lowering a1c.   So stop janumet, sent in metformin only to continue as well as rest of her diabetes medication.   Thanks!  D.r Rogers Blocker

## 2020-10-10 NOTE — Progress Notes (Signed)
I spoke with the pt to give message below. Pt agrees to follow plan. She will pick up Metformin for the pharmacy.

## 2020-11-09 ENCOUNTER — Encounter: Payer: Self-pay | Admitting: Family Medicine

## 2020-11-09 ENCOUNTER — Ambulatory Visit (INDEPENDENT_AMBULATORY_CARE_PROVIDER_SITE_OTHER): Payer: Medicare Other | Admitting: Family Medicine

## 2020-11-09 ENCOUNTER — Other Ambulatory Visit: Payer: Self-pay

## 2020-11-09 VITALS — BP 136/80 | HR 82 | Temp 97.1°F | Ht 64.0 in | Wt 168.4 lb

## 2020-11-09 DIAGNOSIS — Z794 Long term (current) use of insulin: Secondary | ICD-10-CM

## 2020-11-09 DIAGNOSIS — E039 Hypothyroidism, unspecified: Secondary | ICD-10-CM | POA: Diagnosis not present

## 2020-11-09 DIAGNOSIS — I152 Hypertension secondary to endocrine disorders: Secondary | ICD-10-CM

## 2020-11-09 DIAGNOSIS — E1159 Type 2 diabetes mellitus with other circulatory complications: Secondary | ICD-10-CM

## 2020-11-09 DIAGNOSIS — E119 Type 2 diabetes mellitus without complications: Secondary | ICD-10-CM

## 2020-11-09 DIAGNOSIS — J302 Other seasonal allergic rhinitis: Secondary | ICD-10-CM | POA: Diagnosis not present

## 2020-11-09 LAB — POCT GLYCOSYLATED HEMOGLOBIN (HGB A1C): Hemoglobin A1C: 6.6 % — AB (ref 4.0–5.6)

## 2020-11-09 MED ORDER — MONTELUKAST SODIUM 10 MG PO TABS: 10.0000 mg | ORAL_TABLET | Freq: Every day | ORAL | 1 refills | Status: DC

## 2020-11-09 MED ORDER — AZELASTINE HCL 0.1 % NA SOLN: 1.0000 | Freq: Every morning | NASAL | 2 refills | Status: DC

## 2020-11-09 MED ORDER — LEVOTHYROXINE SODIUM 25 MCG PO TABS
ORAL_TABLET | ORAL | 1 refills | Status: DC
Start: 2020-11-09 — End: 2020-12-02

## 2020-11-09 NOTE — Progress Notes (Signed)
Patient: Teresa Rice MRN: [id number] DOB: 10/01/1946 PCP: Orma Flaming, MD     Subjective:  Chief Complaint  Patient presents with  . Hypertension  . Sinusitis  . Facial Pain  . Diabetes    HPI: The patient is a 74 y.o. female who presents today for HTN, diabetes and facial pain. She complains of waking up with coughing spells, also nosebleeds.  Diabetes: Patient is here for follow up of type 2 diabetes. First [phone]. Currently on the following medications: metformin 1000mg  BID and trulicity 3mg /week. We also started her on insulin at her last visit. She is on lantus 10 units at bedtime: 110-154 Takes medications as prescribed. Last A1C was6.6 today.  Currently NOT exercising and following diabetic diet.  Denies any hypoglycemic events. Denies any vision changes, nausea, vomiting, abdominal pain, ulcers/paraesthesia in feet, polyuria, polydipsia or polyphagia. Denies any chest pain, shortness of breath.  Hypertension: Here for follow up of hypertension.  Currently on cozaar 100mg /day. Takes medication as prescribed and denies any side effects. Exercise includes none. Weight has been stable. Denies any chest pain, headaches, shortness of breath, vision changes, swelling in lower extremities.   Facial pain Has intermittent pain in her sinuses. She has had a few nights of coughing fits, but has a lot of post nasal drip. She is very congested in her nose and takes her nasal spray twice a day. (flonase). She has history of allergies and asthma. Has never been on singulair. No fever/chills.    Review of Systems  Constitutional: Negative for fatigue, fever and unexpected weight change.  HENT: Positive for congestion, nosebleeds, sinus pressure and sinus pain. Negative for ear pain, facial swelling and sore throat.   Respiratory: Positive for cough. Negative for shortness of breath and wheezing.   Cardiovascular: Negative for chest pain, palpitations and leg swelling.   Gastrointestinal: Negative for abdominal pain, diarrhea, nausea and vomiting.  Neurological: Positive for headaches.    Allergies Patient is allergic to celebrex [celecoxib], glipizide, penicillins, and sulfa antibiotics.  Past Medical History Patient  has a past medical history of Bladder cancer ([place]) (first dx 2012), Chronic constipation, Full dentures, GERD (gastroesophageal reflux disease), Hyperlipidemia, Hypertension, Mild intermittent asthma, OA (osteoarthritis), Trigger finger of both hands, and Type 2 diabetes mellitus ([place]).  Surgical History Patient  has a past surgical history that includes Bladder surgery (2012   in [facility]); Cholecystectomy (1970); Transurethral resection of bladder tumor (N/A, 05/24/2014); Cystoscopy/retrograde/ureteroscopy (Left, 05/24/2014); and Transurethral resection of bladder tumor (N/A, 08/04/2015).  Family History Pateint's family history includes AAA (abdominal aortic aneurysm) in her sister; Cancer in her brother and father; Diabetes in her brother; Early death in her mother.  Social History Patient  reports that she quit smoking about 26 years ago. Her smoking use included cigarettes. She quit after 30.00 years of use. She has never used smokeless tobacco. She reports current alcohol use. She reports that she does not use drugs.    Objective: Vitals:   11/09/20 1021  BP: 136/80  Pulse: 82  Temp: (!) 97.1 F (36.2 C)  TempSrc: Temporal  SpO2: 98%  Weight: 168 lb 6.4 oz (76.4 kg)  Height: 5\' 4"  (1.626 m)    Body mass index is 28.91 kg/m.  Physical Exam Vitals reviewed.  Constitutional:      Appearance: Normal appearance. She is obese.  HENT:     Head: Normocephalic and atraumatic.     Comments: ttp over frontal and bilateral maxillary sinuses     Right Ear:  Tympanic membrane, ear canal and external ear normal.     Left Ear: Tympanic membrane, ear canal and external ear normal.     Nose: Nose normal. No congestion.      Mouth/Throat:     Mouth: Mucous membranes are moist.     Pharynx: No oropharyngeal exudate.  Neck:     Vascular: No carotid bruit.  Cardiovascular:     Rate and Rhythm: Normal rate and regular rhythm.     Heart sounds: Normal heart sounds.  Pulmonary:     Effort: Pulmonary effort is normal. No respiratory distress.     Breath sounds: Normal breath sounds. No wheezing or rales.  Abdominal:     General: Bowel sounds are normal.     Palpations: Abdomen is soft.  Musculoskeletal:     Cervical back: Normal range of motion and neck supple.  Lymphadenopathy:     Cervical: No cervical adenopathy.  Skin:    Capillary Refill: Capillary refill takes less than 2 seconds.  Neurological:     General: No focal deficit present.     Mental Status: She is alert and oriented to person, place, and time.  Psychiatric:        Mood and Affect: Mood normal.    [place] Office Visit from 11/09/2020 in [place]  PHQ-2 Total Score 0        a1c: 6.6   Assessment/plan: 1. Type 2 diabetes mellitus with insulin therapy ([place]) -A1c to goal! So proud of her.  -continue metformin, trulicity and insulin. Doing great.  -utd on with eye exam, foot exam and vaccines.  -f/u in 3 months.   - CBC with Differential/Platelet; Future - COMPLETE METABOLIC PANEL WITH GFR; Future - POCT glycosylated hemoglobin (Hb A1C)  2. Hypertension associated with diabetes ([place]) Blood pressure is to goal. Continue current anti-hypertensive medications. Refills not given and routine lab work will be done today. Recommended routine exercise and healthy diet including DASH diet and mediterranean diet. Encouraged weight loss. F/u in 6 months.    3. Acquired hypothyroidism, on Levothyoxine 25 mcg daily  - TSH; Future  4. Seasonal allergic rhinitis, unspecified trigger Adding on AM astelin spray, continue with flonase nightly and also singulair will be added especially with hx of asthma. Really  recommend she has a cool mist humidifier at night as well. If has nose bleeds then I would back off nasal sprays to every other day. If no improvement by Friday she is to let me know.     This visit occurred during the SARS-CoV-2 public health emergency.  Safety protocols were in place, including screening questions prior to the visit, additional usage of staff PPE, and extensive cleaning of exam room while observing appropriate contact time as indicated for disinfecting solutions.    Return in about 3 months (around 02/07/2021) for routine f/u wiht fasting labs (htn, chol, diabet) .    Orma Flaming, MD [place]   11/09/2020

## 2020-11-09 NOTE — Patient Instructions (Addendum)
1) start using a new nasal spray in the Am called astelin. You will use this every am. Use the flonase at night. May dry you out and if getting nose bleeds, change to every other day. Would keep a cool mist humidifier at night by your bed.   2) for allergies and asthma starting you on singulair. Allergy pill that you take at night.   3) YOUR A1C is 6.6!!! YESSSSS. Keep up all of your medication. im so proud of you!!!!   4) refilled your thyroid.   6) if not better with your sinuses by Friday, send me an email.   MERRY CHRISTMAS!! Dr. Rogers Blocker

## 2020-11-11 ENCOUNTER — Encounter: Payer: Self-pay | Admitting: Family Medicine

## 2020-11-14 ENCOUNTER — Other Ambulatory Visit: Payer: Self-pay | Admitting: Family Medicine

## 2020-11-14 MED ORDER — DOXYCYCLINE HYCLATE 100 MG PO TABS: 100.0000 mg | ORAL_TABLET | Freq: Two times a day (BID) | ORAL | 0 refills | Status: DC

## 2020-11-24 ENCOUNTER — Ambulatory Visit (INDEPENDENT_AMBULATORY_CARE_PROVIDER_SITE_OTHER): Payer: Medicare Other | Admitting: Psychiatry

## 2020-11-24 ENCOUNTER — Other Ambulatory Visit: Payer: Self-pay

## 2020-11-24 ENCOUNTER — Encounter: Payer: Self-pay | Admitting: Psychiatry

## 2020-11-24 DIAGNOSIS — F4001 Agoraphobia with panic disorder: Secondary | ICD-10-CM | POA: Diagnosis not present

## 2020-11-24 DIAGNOSIS — F5105 Insomnia due to other mental disorder: Secondary | ICD-10-CM

## 2020-11-24 DIAGNOSIS — F4310 Post-traumatic stress disorder, unspecified: Secondary | ICD-10-CM

## 2020-11-24 DIAGNOSIS — G4721 Circadian rhythm sleep disorder, delayed sleep phase type: Secondary | ICD-10-CM

## 2020-11-24 MED ORDER — ALPRAZOLAM 1 MG PO TABS: ORAL_TABLET | ORAL | 5 refills | Status: DC
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
## 2020-12-02 ENCOUNTER — Other Ambulatory Visit: Payer: Self-pay | Admitting: Family Medicine

## 2020-12-09 ENCOUNTER — Ambulatory Visit: Payer: Medicare Other

## 2020-12-15 ENCOUNTER — Other Ambulatory Visit: Payer: Self-pay

## 2020-12-15 ENCOUNTER — Ambulatory Visit (INDEPENDENT_AMBULATORY_CARE_PROVIDER_SITE_OTHER): Payer: Medicare Other

## 2020-12-15 VITALS — BP 140/78 | HR 102 | Temp 97.3°F | Wt 173.2 lb

## 2020-12-15 DIAGNOSIS — Z0000 Encounter for general adult medical examination without abnormal findings: Secondary | ICD-10-CM

## 2020-12-15 NOTE — Patient Instructions (Addendum)
Teresa Rice , Thank you for taking time to come for your Medicare Wellness Visit. I appreciate your ongoing commitment to your health goals. Please review the following plan we discussed and let me know if I can assist you in the future.   Screening recommendations/referrals: Colonoscopy: declined and discussed Mammogram: Done 02/06/19 Bone Density: Done 10/14/18 Recommended yearly ophthalmology/optometry visit for glaucoma screening and checkup Recommended yearly dental visit for hygiene and checkup  Vaccinations: Influenza vaccine: Done 08/10/20 Up to date Pneumococcal vaccine: Up to date Tdap vaccine: Declined and discussed Shingles vaccine: Shingrix discussed. Please contact your pharmacy for coverage information.    Covid-19:Completed 9/22 & 09/09/20  Advanced directives: Advance directive discussed with you today. I have provided a copy for you to complete at home and have notarized. Once this is complete please bring a copy in to our office so we can scan it into your chart.  Conditions/risks identified: work on diet   Next appointment: Follow up in one year for your annual wellness visit    Preventive Care 59 Years and Older, Female Preventive care refers to lifestyle choices and visits with your health care provider that can promote health and wellness. What does preventive care include?  A yearly physical exam. This is also called an annual well check.  Dental exams once or twice a year.  Routine eye exams. Ask your health care provider how often you should have your eyes checked.  Personal lifestyle choices, including:  Daily care of your teeth and gums.  Regular physical activity.  Eating a healthy diet.  Avoiding tobacco and drug use.  Limiting alcohol use.  Practicing safe sex.  Taking low-dose aspirin every day.  Taking vitamin and mineral supplements as recommended by your health care provider. What happens during an annual well check? The services  and screenings done by your health care provider during your annual well check will depend on your age, overall health, lifestyle risk factors, and family history of disease. Counseling  Your health care provider may ask you questions about your:  Alcohol use.  Tobacco use.  Drug use.  Emotional well-being.  Home and relationship well-being.  Sexual activity.  Eating habits.  History of falls.  Memory and ability to understand (cognition).  Work and work Statistician.  Reproductive health. Screening  You may have the following tests or measurements:  Height, weight, and BMI.  Blood pressure.  Lipid and cholesterol levels. These may be checked every 5 years, or more frequently if you are over 76 years old.  Skin check.  Lung cancer screening. You may have this screening every year starting at age 73 if you have a 30-pack-year history of smoking and currently smoke or have quit within the past 15 years.  Fecal occult blood test (FOBT) of the stool. You may have this test every year starting at age 35.  Flexible sigmoidoscopy or colonoscopy. You may have a sigmoidoscopy every 5 years or a colonoscopy every 10 years starting at age 33.  Hepatitis C blood test.  Hepatitis B blood test.  Sexually transmitted disease (STD) testing.  Diabetes screening. This is done by checking your blood sugar (glucose) after you have not eaten for a while (fasting). You may have this done every 1-3 years.  Bone density scan. This is done to screen for osteoporosis. You may have this done starting at age 16.  Mammogram. This may be done every 1-2 years. Talk to your health care provider about how often you should have  regular mammograms. Talk with your health care provider about your test results, treatment options, and if necessary, the need for more tests. Vaccines  Your health care provider may recommend certain vaccines, such as:  Influenza vaccine. This is recommended every  year.  Tetanus, diphtheria, and acellular pertussis (Tdap, Td) vaccine. You may need a Td booster every 10 years.  Zoster vaccine. You may need this after age 35.  Pneumococcal 13-valent conjugate (PCV13) vaccine. One dose is recommended after age 26.  Pneumococcal polysaccharide (PPSV23) vaccine. One dose is recommended after age 76. Talk to your health care provider about which screenings and vaccines you need and how often you need them. This information is not intended to replace advice given to you by your health care provider. Make sure you discuss any questions you have with your health care provider. Document Released: 12/09/2015 Document Revised: 08/01/2016 Document Reviewed: 09/13/2015 Elsevier Interactive Patient Education  2017 [place] Prevention in the Home Falls can cause injuries. They can happen to people of all ages. There are many things you can do to make your home safe and to help prevent falls. What can I do on the outside of my home?  Regularly fix the edges of walkways and driveways and fix any cracks.  Remove anything that might make you trip as you walk through a door, such as a raised step or threshold.  Trim any bushes or trees on the path to your home.  Use bright outdoor lighting.  Clear any walking paths of anything that might make someone trip, such as rocks or tools.  Regularly check to see if handrails are loose or broken. Make sure that both sides of any steps have handrails.  Any raised decks and porches should have guardrails on the edges.  Have any leaves, snow, or ice cleared regularly.  Use sand or salt on walking paths during winter.  Clean up any spills in your garage right away. This includes oil or grease spills. What can I do in the bathroom?  Use night lights.  Install grab bars by the toilet and in the tub and shower. Do not use towel bars as grab bars.  Use non-skid mats or decals in the tub or shower.  If you  need to sit down in the shower, use a plastic, non-slip stool.  Keep the floor dry. Clean up any water that spills on the floor as soon as it happens.  Remove soap buildup in the tub or shower regularly.  Attach bath mats securely with double-sided non-slip rug tape.  Do not have throw rugs and other things on the floor that can make you trip. What can I do in the bedroom?  Use night lights.  Make sure that you have a light by your bed that is easy to reach.  Do not use any sheets or blankets that are too big for your bed. They should not hang down onto the floor.  Have a firm chair that has side arms. You can use this for support while you get dressed.  Do not have throw rugs and other things on the floor that can make you trip. What can I do in the kitchen?  Clean up any spills right away.  Avoid walking on wet floors.  Keep items that you use a lot in easy-to-reach places.  If you need to reach something above you, use a strong step stool that has a grab bar.  Keep electrical cords out of the  way.  Do not use floor polish or wax that makes floors slippery. If you must use wax, use non-skid floor wax.  Do not have throw rugs and other things on the floor that can make you trip. What can I do with my stairs?  Do not leave any items on the stairs.  Make sure that there are handrails on both sides of the stairs and use them. Fix handrails that are broken or loose. Make sure that handrails are as long as the stairways.  Check any carpeting to make sure that it is firmly attached to the stairs. Fix any carpet that is loose or worn.  Avoid having throw rugs at the top or bottom of the stairs. If you do have throw rugs, attach them to the floor with carpet tape.  Make sure that you have a light switch at the top of the stairs and the bottom of the stairs. If you do not have them, ask someone to add them for you. What else can I do to help prevent falls?  Wear shoes  that:  Do not have high heels.  Have rubber bottoms.  Are comfortable and fit you well.  Are closed at the toe. Do not wear sandals.  If you use a stepladder:  Make sure that it is fully opened. Do not climb a closed stepladder.  Make sure that both sides of the stepladder are locked into place.  Ask someone to hold it for you, if possible.  Clearly mark and make sure that you can see:  Any grab bars or handrails.  First and last steps.  Where the edge of each step is.  Use tools that help you move around (mobility aids) if they are needed. These include:  Canes.  Walkers.  Scooters.  Crutches.  Turn on the lights when you go into a dark area. Replace any light bulbs as soon as they burn out.  Set up your furniture so you have a clear path. Avoid moving your furniture around.  If any of your floors are uneven, fix them.  If there are any pets around you, be aware of where they are.  Review your medicines with your doctor. Some medicines can make you feel dizzy. This can increase your chance of falling. Ask your doctor what other things that you can do to help prevent falls. This information is not intended to replace advice given to you by your health care provider. Make sure you discuss any questions you have with your health care provider. Document Released: 09/08/2009 Document Revised: 04/19/2016 Document Reviewed: 12/17/2014 Elsevier Interactive Patient Education  2017 [employer].

## 2020-12-15 NOTE — Progress Notes (Signed)
Subjective:   Teresa Rice is a 75 y.o. female who presents for Medicare Annual (Subsequent) preventive examination.  Review of Systems     Cardiac Risk Factors include: advanced age (>64mn, >>50women);diabetes mellitus;dyslipidemia;hypertension     Objective:    Today's Vitals   12/15/20 1048  BP: 140/78  Pulse: (!) 102  Temp: (!) 97.3 F (36.3 C)  SpO2: 97%  Weight: 173 lb 3.2 oz (78.6 kg)   Body mass index is 29.73 kg/m.  Advanced Directives 12/15/2020 08/21/2019 07/30/2019 06/26/2019 08/04/2015 05/21/2014 05/21/2014  Does Patient Have a Medical Advance Directive? No No No No No Patient does not have advance directive;Patient would not like information Patient does not have advance directive  Would patient like information on creating a medical advance directive? No - Patient declined Yes (MAU/Ambulatory/Procedural Areas - Information given) - - No - patient declined information - -    Current Medications (verified) Outpatient Encounter Medications as of 12/15/2020  Medication Sig  . Accu-Chek Softclix Lancets lancets   . ALPRAZolam (XANAX) 1 MG tablet 1/2 tablet TID and 1 at night  . aspirin EC 81 MG tablet Take 81 mg by mouth daily.  .Marland Kitchenazelastine (ASTELIN) 0.1 % nasal spray Place 1 spray into both nostrils in the morning. Use in each nostril as directed  . benzonatate (TESSALON) 200 MG capsule Take 1 capsule (200 mg total) by mouth 2 (two) times daily as needed for cough.  . blood glucose meter kit and supplies KIT Dispense based on patient and insurance preference. Use up to four times daily as directed. (FOR ICD-9 250.00, 250.01).  . Blood Glucose Monitoring Suppl (ACCU-CHEK AVIVA PLUS) w/Device KIT   . cholecalciferol (VITAMIN D3) 25 MCG (1000 UNIT) tablet Take 1,000 Units by mouth daily.  . Dulaglutide (TRULICITY) 3 MZC/5.8IFSOPN Inject 3 mg into the skin once a week.  . fenofibrate 160 MG tablet Take 160 mg by mouth daily.  . fluticasone (FLONASE) 50 MCG/ACT nasal  spray Place 2 sprays into both nostrils daily.  . furosemide (LASIX) 20 MG tablet TAKE 1 TABLET(20 MG) BY MOUTH DAILY  . glucose blood test strip Use as instructed  . insulin glargine (LANTUS SOLOSTAR) 100 UNIT/ML Solostar Pen Inject 10 Units into the skin daily.  . insulin glargine (LANTUS) 100 UNIT/ML Solostar Pen Inject into the skin daily.  . Insulin Pen Needle (PEN NEEDLES) 32G X 5 MM MISC Please use new needle after very Lantus injection.  .Marland Kitchenlevothyroxine (SYNTHROID) 25 MCG tablet TAKE 1 TABLET(25 MCG) BY MOUTH DAILY BEFORE BREAKFAST  . losartan (COZAAR) 100 MG tablet Take 1 tablet (100 mg total) by mouth daily.  . magnesium oxide (MAG-OX) 400 MG tablet Take 400 mg by mouth 2 (two) times daily.  . metFORMIN (GLUCOPHAGE) 1000 MG tablet Take 1 tablet (1,000 mg total) by mouth 2 (two) times daily with a meal.  . montelukast (SINGULAIR) 10 MG tablet Take 1 tablet (10 mg total) by mouth at bedtime.  . Multiple Vitamin (MULTIVITAMIN WITH MINERALS) TABS tablet Take 1 tablet by mouth daily.  . Omega 3 1000 MG CAPS Take 1,000 mg by mouth daily.  .Marland Kitchenomeprazole (PRILOSEC) 20 MG capsule Take 1 capsule (20 mg total) by mouth daily.  . Polyethylene Glycol 3350 (MIRALAX PO) Take by mouth every evening.  . rosuvastatin (CRESTOR) 10 MG tablet Take 1 tablet (10 mg total) by mouth daily.  . SYMBICORT 160-4.5 MCG/ACT inhaler INHALE 2 PUFFS INTO THE LUNGS TWICE DAILY  . VITAMIN  E PO Take by mouth.  . [DISCONTINUED] Calcium Carbonate-Vitamin D 600-400 MG-UNIT tablet Take by mouth. (Patient not taking: Reported on 12/15/2020)  . [DISCONTINUED] doxycycline (VIBRA-TABS) 100 MG tablet Take 1 tablet (100 mg total) by mouth 2 (two) times daily. (Patient not taking: Reported on 12/15/2020)  . [DISCONTINUED] methocarbamol (ROBAXIN) 500 MG tablet Take 1 tablet (500 mg total) by mouth 3 (three) times daily as needed for muscle spasms. (Patient not taking: Reported on 12/15/2020)   No facility-administered encounter  medications on file as of 12/15/2020.    Allergies (verified) Celebrex [celecoxib], Glipizide, Penicillins, and Sulfa antibiotics   History: Past Medical History:  Diagnosis Date  . Bladder cancer [facility]) first dx 2012   Recurrent Bladder Cancer--  (urologist-  dr Diona Fanti)  . Chronic constipation   . Full dentures   . GERD (gastroesophageal reflux disease)   . Hyperlipidemia   . Hypertension   . Mild intermittent asthma   . OA (osteoarthritis)    fingers   . Trigger finger of both hands    wear slints and special gloves at night  . Type 2 diabetes mellitus ([place])    Past Surgical History:  Procedure Laterality Date  . BLADDER SURGERY  2012   in [place]   TURBT  . CHOLECYSTECTOMY  1970  . CYSTOSCOPY/RETROGRADE/URETEROSCOPY Left 05/24/2014   Procedure: CYSTOSCOPY/RETROGRADE/ URETEROSCOPY;  Surgeon: Jorja Loa, MD;  Location: [facility];  Service: Urology;  Laterality: Left;  . TRANSURETHRAL RESECTION OF BLADDER TUMOR N/A 05/24/2014   Procedure: TRANSURETHRAL RESECTION OF BLADDER TUMOR (TURBT) ;  Surgeon: Jorja Loa, MD;  Location: [facility];  Service: Urology;  Laterality: N/A;  . TRANSURETHRAL RESECTION OF BLADDER TUMOR N/A 08/04/2015   Procedure: TRANSURETHRAL RESECTION OF BLADDER TUMOR (TURBT);  Surgeon: Franchot Gallo, MD;  [facility]: [facility];  Service: Urology;  Laterality: N/A;   Family History  Problem Relation Age of Onset  . Early death Mother   . AAA (abdominal aortic aneurysm) Sister   . Cancer Father   . Diabetes Brother   . Cancer Brother    Social History   Socioeconomic History  . Marital status: Widowed    Spouse name: Not on file  . Number of children: 1  . Years of education: 69  . Highest education level: Not on file  Occupational History  . Occupation: retired  Tobacco Use  . Smoking status: Former Smoker    Years: 30.00    Types: Cigarettes    Quit date: 05/21/1994    Years since quitting: 26.5  . Smokeless  tobacco: Never Used  Vaping Use  . Vaping Use: Never used  Substance and Sexual Activity  . Alcohol use: Yes    Comment: RARE  . Drug use: No  . Sexual activity: Not on file  Other Topics Concern  . Not on file  Social History Narrative   Lives in an apartment, lives alone   One daughter   Right handed    Retired from [employer] work   [employer] level of education:  GED   Social Determinants of Radio broadcast assistant Strain: [place]   . Difficulty of Paying Living Expenses: Not hard at all  Food Insecurity: No Food Insecurity  . Worried About Charity fundraiser in the Last Year: Never true  . Ran Out of Food in the Last Year: Never true  Transportation Needs: No Transportation Needs  . Lack of Transportation (Medical): No  . Lack of  Transportation (Non-Medical): No  Physical Activity: Inactive  . Days of Exercise per Week: 0 days  . Minutes of Exercise per Session: 0 min  Stress: No Stress Concern Present  . Feeling of Stress : Only a little  Social Connections: Moderately Isolated  . Frequency of Communication with Friends and Family: More than three times a week  . Frequency of Social Gatherings with Friends and Family: Never  . Attends Religious Services: More than 4 times per year  . Active Member of Clubs or Organizations: No  . Attends Archivist Meetings: Never  . Marital Status: Widowed    Tobacco Counseling Counseling given: Not Answered   Clinical Intake:  Pre-visit preparation completed: Yes  Pain : No/denies pain     BMI - recorded: 29.73 Nutritional Status: BMI 25 -29 Overweight Nutritional Risks: None Diabetes: Yes CBG done?: No Did pt. bring in CBG monitor from home?: No  How often do you need to have someone help you when you read instructions, pamphlets, or other written materials from your doctor or pharmacy?: 1 - Never  Diabetic?Nutrition Risk Assessment:  Has the patient had any N/V/D within the last 2 months?  No  Does  the patient have any non-healing wounds?  No  Has the patient had any unintentional weight loss or weight gain?  No   Diabetes:  Is the patient diabetic?  Yes  If diabetic, was a CBG obtained today?  No  Did the patient bring in their glucometer from home?  No  How often do you monitor your CBG's? Usually daily.   Financial Strains and Diabetes Management:  Are you having any financial strains with the device, your supplies or your medication? No .  Does the patient want to be seen by Chronic Care Management for management of their diabetes?  No  Would the patient like to be referred to a Nutritionist or for Diabetic Management?  No   Diabetic Exams:  Diabetic Eye Exam: Completed 05/17/20 Diabetic Foot Exam: Completed 01/28/20   Interpreter Needed?: No  Information entered by :: Teresa Rakes, LPN   Activities of Daily Living In your present state of health, do you have any difficulty performing the following activities: 12/15/2020  Hearing? Y  Comment wears hearing aids  Vision? N  Difficulty concentrating or making decisions? N  Walking or climbing stairs? N  Dressing or bathing? N  Doing errands, shopping? N  Preparing Food and eating ? N  Using the Toilet? N  In the past six months, have you accidently leaked urine? N  Do you have problems with loss of bowel control? N  Managing your Medications? N  Managing your Finances? N  Housekeeping or managing your Housekeeping? N  Some recent data might be hidden    Patient Care Team: Orma Flaming, MD as PCP - General (Family Medicine) Alda Berthold, DO as Consulting Physician (Neurology) Lonzo Candy, AUD (Audiology)  Indicate any recent Medical Services you may have received from other than [facility] providers in the past year (date may be approximate).     Assessment:   This is a routine wellness examination for Teresa Rice.  Hearing/Vision screen  Hearing Screening   125Hz  250Hz  500Hz  1000Hz  2000Hz  3000Hz  4000Hz   6000Hz  8000Hz   Right ear:           Left ear:           Comments: Wears hearing aids  Vision Screening Comments: Pt follows up with [facility]  for annual exams  Dietary issues and exercise activities discussed: Current Exercise Habits: The patient does not participate in regular exercise at present  Goals    . Patient Stated     Work on diet       Depression Screen PHQ 2/9 Scores 12/15/2020 11/09/2020 09/02/2020 08/10/2020 05/16/2020 05/16/2020 05/05/2020  PHQ - 2 Score 1 0 0 0 0 0 0  PHQ- 9 Score - - - - - - -    Fall Risk Fall Risk  12/15/2020 11/09/2020 09/02/2020 08/10/2020 05/16/2020  Falls in the past year? 1 0 0 0 0  Number falls in past yr: 1 - - - -  Injury with Fall? 1 - - - -  Comment left foot pain - - - -  Risk for fall due to : - No Fall Risks No Fall Risks No Fall Risks -  Follow up - - - - -    FALL [place]:  Any stairs in or around the home? No  If so, are there any without handrails? No  Home free of loose throw rugs in walkways, pet beds, electrical cords, etc? Yes  Adequate lighting in your home to reduce risk of falls? Yes   ASSISTIVE DEVICES UTILIZED TO PREVENT FALLS:  Life alert? No  Use of a cane, walker or w/c? No  Grab bars in the bathroom? Yes  Shower chair or bench in shower? No  Elevated toilet seat or a handicapped toilet? Yes   TIMED UP AND GO:  Was the test performed? Yes .  Length of time to ambulate 10 feet: 10 sec.   Gait steady and fast without use of assistive device  Cognitive Function:     6CIT Screen 12/15/2020  What Year? 0 points  What month? 0 points  Count back from 20 0 points  Months in reverse 0 points  Repeat phrase 4 points    Immunizations Immunization History  Administered Date(s) Administered  . Fluad Quad(high Dose 65+) 08/12/2019, 08/10/2020  . PFIZER(Purple Top)SARS-COV-2 Vaccination 08/17/2020, 09/09/2020  . Pneumococcal Conjugate-13 07/09/2019  .  Pneumococcal Polysaccharide-23 08/10/2020    TDAP status: Due, Education has been provided regarding the importance of this vaccine. Advised may receive this vaccine at local pharmacy or Health Dept. Aware to provide a copy of the vaccination record if obtained from local pharmacy or Health Dept. Verbalized acceptance and understanding.  Flu Vaccine status: Up to date  Pneumococcal vaccine status: Up to date  Covid-19 vaccine status: Completed vaccines  Qualifies for Shingles Vaccine? Yes   Zostavax completed No   Shingrix Completed?: No.    Education has been provided regarding the importance of this vaccine. Patient has been advised to call insurance company to determine out of pocket expense if they have not yet received this vaccine. Advised may also receive vaccine at local pharmacy or Health Dept. Verbalized acceptance and understanding.  Screening Tests Health Maintenance  Topic Date Due  . COLONOSCOPY (Pts 45-69yr Insurance coverage will need to be confirmed)  08/10/2021 (Originally 09/17/1990)  . TETANUS/TDAP  12/15/2021 (Originally 09/17/1964)  . FOOT EXAM  01/27/2021  . COVID-19 Vaccine (3 - Booster for Pfizer series) 03/10/2021  . HEMOGLOBIN A1C  05/10/2021  . OPHTHALMOLOGY EXAM  05/17/2021  . DEXA SCAN  10/14/2021  . INFLUENZA VACCINE  Completed  . Hepatitis C Screening  Completed  . PNA vac Low Risk Adult  Completed    Health Maintenance  There are no preventive care  reminders to display for this patient.  Colorectal cancer screening: No longer required. per pt declined  Mammogram status: Completed 02/06/19. Repeat every year  Bone Density status: Completed 10/14/18. Results reflect: Bone density results: OSTEOPENIA. Repeat every 2 years.   Additional Screening:  Hepatitis C Screening:  Completed 10/14/19  Vision Screening: Recommended annual ophthalmology exams for early detection of glaucoma and other disorders of the eye. Is the patient up to date with  their annual eye exam?  Yes  Who is the provider or what is the name of the office in which the patient attends annual eye exams? Dr Laveda Abbe   Dental Screening: Recommended annual dental exams for proper oral hygiene  Community Resource Referral / Chronic Care Management: CRR required this visit?  No   CCM required this visit?  No      Plan:     I have personally reviewed and noted the following in the patient's chart:   . Medical and social history . Use of alcohol, tobacco or illicit drugs  . Current medications and supplements . Functional ability and status . Nutritional status . Physical activity . Advanced directives . List of other physicians . Hospitalizations, surgeries, and ER visits in previous 12 months . Vitals . Screenings to include cognitive, depression, and falls . Referrals and appointments  In addition, I have reviewed and discussed with patient certain preventive protocols, quality metrics, and best practice recommendations. A written personalized care plan for preventive services as well as general preventive health recommendations were provided to patient.     Willette Brace, LPN   7/33/1250   Nurse Notes: None

## 2020-12-22 ENCOUNTER — Other Ambulatory Visit: Payer: Self-pay | Admitting: Family Medicine

## 2020-12-22 DIAGNOSIS — K219 Gastro-esophageal reflux disease without esophagitis: Secondary | ICD-10-CM

## 2020-12-26 ENCOUNTER — Other Ambulatory Visit: Payer: Self-pay

## 2020-12-26 ENCOUNTER — Encounter: Payer: Self-pay | Admitting: Family Medicine

## 2021-01-18 ENCOUNTER — Other Ambulatory Visit: Payer: Self-pay | Admitting: Psychiatry

## 2021-01-18 ENCOUNTER — Telehealth: Payer: Self-pay | Admitting: Psychiatry

## 2021-01-18 MED ORDER — DULOXETINE HCL 30 MG PO CPEP: ORAL_CAPSULE | ORAL | 0 refills | Status: DC

## 2021-01-18 NOTE — Telephone Encounter (Signed)
Patient has previously been on an antidepressant paroxetine but wanted to stop it.  She has been off of it for some time.  It is time to restart an antidepressant.  We will start something that is not a traditional SSRI per her request. Start duloxetine which is Cymbalta, 30 mg capsule 1 daily for 1 week, then 2 daily.  Prescription was sent in.

## 2021-01-18 NOTE — Telephone Encounter (Signed)
I called her and informed her of everything and she still has concerns.She did research on Cymbalta and saw that it can cause high blood pressure.She is worried due to the fact she already has high blood pressure and the medicine she is taking for it now does not help with that.

## 2021-01-18 NOTE — Telephone Encounter (Signed)
Yes it safe for her to take with any of her other medications.  It is low and side effects and specifically does not tend to cause weight gain or tiredness.  It is an antidepressant and will take 3 or 4 weeks to work just like all antidepressants.

## 2021-01-18 NOTE — Telephone Encounter (Signed)
Pt wants to know is it ok to take Cymbalta with her Trulicity and insulin shots.

## 2021-01-18 NOTE — Telephone Encounter (Signed)
It is very rare for Cymbalta to cause high blood pressure.  If she would rather return to the Paxil that she took before then let me know and I will send in that prescription.

## 2021-01-18 NOTE — Telephone Encounter (Signed)
Pt called to report very depressed, stays in bed until 11:00 am. No drive, can't shut her mind off. Asking if you can give her something to help. Moved apt up to 4/13 and on canc list. Contact # [phone]

## 2021-01-19 NOTE — Telephone Encounter (Signed)
She said Paxil will be fine to call in for her.

## 2021-01-20 ENCOUNTER — Other Ambulatory Visit: Payer: Self-pay | Admitting: Psychiatry

## 2021-01-20 MED ORDER — PAROXETINE HCL 30 MG PO TABS: ORAL_TABLET | ORAL | 1 refills | Status: DC

## 2021-01-20 NOTE — Telephone Encounter (Signed)
RX sent

## 2021-02-08 ENCOUNTER — Other Ambulatory Visit: Payer: Self-pay

## 2021-02-08 ENCOUNTER — Encounter: Payer: Self-pay | Admitting: Family Medicine

## 2021-02-08 ENCOUNTER — Ambulatory Visit (INDEPENDENT_AMBULATORY_CARE_PROVIDER_SITE_OTHER): Payer: Medicare Other | Admitting: Family Medicine

## 2021-02-08 VITALS — BP 154/80 | HR 84 | Temp 97.3°F | Ht 64.0 in | Wt 171.2 lb

## 2021-02-08 DIAGNOSIS — E1169 Type 2 diabetes mellitus with other specified complication: Secondary | ICD-10-CM

## 2021-02-08 DIAGNOSIS — Z794 Long term (current) use of insulin: Secondary | ICD-10-CM | POA: Diagnosis not present

## 2021-02-08 DIAGNOSIS — E785 Hyperlipidemia, unspecified: Secondary | ICD-10-CM

## 2021-02-08 DIAGNOSIS — E119 Type 2 diabetes mellitus without complications: Secondary | ICD-10-CM | POA: Diagnosis not present

## 2021-02-08 DIAGNOSIS — E039 Hypothyroidism, unspecified: Secondary | ICD-10-CM | POA: Diagnosis not present

## 2021-02-08 LAB — LIPID PANEL
Cholesterol: 121 mg/dL (ref 0–200)
HDL: 29.7 mg/dL — ABNORMAL LOW (ref >39.00)
LDL Cholesterol: 57 mg/dL (ref 0–99)
NonHDL: 90.85
Total CHOL/HDL Ratio: 4
Triglycerides: 169 mg/dL — ABNORMAL HIGH (ref 0.0–149.0)
VLDL: 33.8 mg/dL (ref 0.0–40.0)

## 2021-02-08 LAB — CBC WITH DIFFERENTIAL/PLATELET
Basophils Absolute: 0.1 10*3/uL (ref 0.0–0.1)
Basophils Relative: 1.3 % (ref 0.0–3.0)
Eosinophils Absolute: 0.3 10*3/uL (ref 0.0–0.7)
Eosinophils Relative: 4 % (ref 0.0–5.0)
HCT: 34.8 % — ABNORMAL LOW (ref 36.0–46.0)
Hemoglobin: 11.5 g/dL — ABNORMAL LOW (ref 12.0–15.0)
Lymphocytes Relative: 37.5 % (ref 12.0–46.0)
Lymphs Abs: 2.3 10*3/uL (ref 0.7–4.0)
MCHC: 32.9 g/dL (ref 30.0–36.0)
MCV: 77 fl — ABNORMAL LOW (ref 78.0–100.0)
Monocytes Absolute: 0.4 10*3/uL (ref 0.1–1.0)
Monocytes Relative: 6.3 % (ref 3.0–12.0)
Neutro Abs: 3.2 10*3/uL (ref 1.4–7.7)
Neutrophils Relative %: 50.9 % (ref 43.0–77.0)
Platelets: 309 10*3/uL (ref 150.0–400.0)
RBC: 4.52 Mil/uL (ref 3.87–5.11)
RDW: 15.4 % (ref 11.5–15.5)
WBC: 6.2 10*3/uL (ref 4.0–10.5)

## 2021-02-08 LAB — COMPREHENSIVE METABOLIC PANEL
ALT: 27 U/L (ref 0–35)
AST: 29 U/L (ref 0–37)
Albumin: 4 g/dL (ref 3.5–5.2)
Alkaline Phosphatase: 62 U/L (ref 39–117)
BUN: 19 mg/dL (ref 6–23)
CO2: 28 mEq/L (ref 19–32)
Calcium: 9.7 mg/dL (ref 8.4–10.5)
Chloride: 103 mEq/L (ref 96–112)
Creatinine, Ser: 0.61 mg/dL (ref 0.40–1.20)
GFR: 87.47 mL/min (ref >60.00)
Glucose, Bld: 145 mg/dL — ABNORMAL HIGH (ref 70–99)
Potassium: 4.1 mEq/L (ref 3.5–5.1)
Sodium: 140 mEq/L (ref 135–145)
Total Bilirubin: 0.4 mg/dL (ref 0.2–1.2)
Total Protein: 6.6 g/dL (ref 6.0–8.3)

## 2021-02-08 LAB — T4, FREE: Free T4: 1.08 ng/dL (ref 0.60–1.60)

## 2021-02-08 LAB — HEMOGLOBIN A1C: Hgb A1c MFr Bld: 7.7 % — ABNORMAL HIGH (ref 4.6–6.5)

## 2021-02-08 LAB — TSH: TSH: 2.98 u[IU]/mL (ref 0.35–4.50)

## 2021-02-08 MED ORDER — ROSUVASTATIN CALCIUM 10 MG PO TABS: 10.0000 mg | ORAL_TABLET | Freq: Every day | ORAL | 3 refills | Status: DC

## 2021-02-08 MED ORDER — FUROSEMIDE 20 MG PO TABS: ORAL_TABLET | ORAL | 1 refills | Status: DC

## 2021-02-08 MED ORDER — TRULICITY 3 MG/0.5ML ~~LOC~~ SOAJ
3.0000 mg | SUBCUTANEOUS | 1 refills | Status: DC
Start: 2021-02-08 — End: 2021-04-21

## 2021-02-08 MED ORDER — BLOOD GLUCOSE METER KIT: PACK | 0 refills | Status: AC

## 2021-02-08 MED ORDER — FENOFIBRATE 160 MG PO TABS: 160.0000 mg | ORAL_TABLET | Freq: Every day | ORAL | 3 refills | Status: DC

## 2021-02-08 NOTE — Progress Notes (Signed)
Please disregard ,opened in error 

## 2021-02-08 NOTE — Patient Instructions (Signed)
-  you're doing great! You can go 6 months until I see you again! Foot exam normal.  -refilled meds that you needed as well as a new glucose meter with lancet/strips.   Let me know if you need anything,  Happy spring! Dr. Rogers Blocker

## 2021-02-08 NOTE — Progress Notes (Signed)
Patient: Teresa Rice MRN: [id number] DOB: October 11, 1946 PCP: Orma Flaming, MD     Subjective:  Chief Complaint  Patient presents with  . Diabetes  . Hyperlipidemia  . Hypothyroidism    HPI: The patient is a 75 y.o. female who presents today for routine follow up for hypothyroid, DM, and Cholesterol. She recently started Paroxetine for symptoms of depression she says she doesn't like the way it makes her feel, but it does help. She is followed by psychiatry for this.   Diabetes: Patient is here for follow up of type 2 diabetes. First [phone]. Currently on the following medications: metformin 1000mg  BID and trulicity 3mg /week. She is on lantus 10 units at bedtime: fasting sugars 120-140.  Takes medications as prescribed. Last A1C was6.6.  Currently NOT exercising and following diabetic diet.  Denies any hypoglycemic events. Denies any vision changes, nausea, vomiting, abdominal pain, ulcers/paraesthesia in feet, polyuria, polydipsia or polyphagia. Denies any chest pain, shortness of breath.  Hyperlipidemia She is currently on crestor 10mg . Takes as prescribed. No side effects. She is needing a refill of this.   Hypothyroidism Appears euthyroid today. Takes as prescribed. Specifically denies any enlarging masses around thyroid, voice change, trouble swallowing, weight gain or loss, temperature intolerance.   On ASA. No hx of CAD/CVA. Was put on a long time ago   Review of Systems  Constitutional: Negative for appetite change, chills, fatigue and fever.  HENT: Negative for dental problem, ear pain, hearing loss and trouble swallowing.   Eyes: Negative for visual disturbance.  Respiratory: Negative for cough, chest tightness and shortness of breath.   Cardiovascular: Negative for chest pain, palpitations and leg swelling.  Gastrointestinal: Negative for abdominal pain, blood in stool, diarrhea and nausea.  Endocrine: Negative for cold intolerance, polydipsia, polyphagia and  polyuria.  Genitourinary: Negative for dysuria and hematuria.  Musculoskeletal: Negative for arthralgias.  Skin: Negative for rash.  Neurological: Negative for dizziness and headaches.  Psychiatric/Behavioral: Negative for dysphoric mood and sleep disturbance. The patient is not nervous/anxious.     Allergies Patient is allergic to celebrex [celecoxib], glipizide, penicillins, and sulfa antibiotics.  Past Medical History Patient  has a past medical history of Bladder cancer ([place]) (first dx 2012), Chronic constipation, Full dentures, GERD (gastroesophageal reflux disease), Hyperlipidemia, Hypertension, Mild intermittent asthma, OA (osteoarthritis), Trigger finger of both hands, and Type 2 diabetes mellitus ([place]).  Surgical History Patient  has a past surgical history that includes Bladder surgery (2012   in [facility]); Cholecystectomy (1970); Transurethral resection of bladder tumor (N/A, 05/24/2014); Cystoscopy/retrograde/ureteroscopy (Left, 05/24/2014); and Transurethral resection of bladder tumor (N/A, 08/04/2015).  Family History Pateint's family history includes AAA (abdominal aortic aneurysm) in her sister; Cancer in her brother and father; Diabetes in her brother; Early death in her mother.  Social History Patient  reports that she quit smoking about 26 years ago. Her smoking use included cigarettes. She quit after 30.00 years of use. She has never used smokeless tobacco. She reports current alcohol use. She reports that she does not use drugs.    Objective: Vitals:   02/08/21 0930  BP: (!) 154/80  Pulse: 84  Temp: (!) 97.3 F (36.3 C)  TempSrc: Temporal  SpO2: 96%  Weight: 171 lb 3.2 oz (77.7 kg)  Height: 5\' 4"  (1.626 m)    Body mass index is 29.39 kg/m.  Physical Exam Vitals reviewed.  Constitutional:      Appearance: Normal appearance. She is well-developed and normal weight.  HENT:     Head:  Normocephalic and atraumatic.     Right Ear: External ear normal.      Left Ear: External ear normal.     Mouth/Throat:     Mouth: Mucous membranes are moist.  Eyes:     Conjunctiva/sclera: Conjunctivae normal.     Pupils: Pupils are equal, round, and reactive to light.  Neck:     Thyroid: No thyromegaly.     Vascular: No carotid bruit.  Cardiovascular:     Rate and Rhythm: Normal rate and regular rhythm.     Pulses: Normal pulses.     Heart sounds: Normal heart sounds. No murmur heard.   Pulmonary:     Effort: Pulmonary effort is normal.     Breath sounds: Normal breath sounds.  Abdominal:     General: Abdomen is flat. Bowel sounds are normal. There is no distension.     Palpations: Abdomen is soft.     Tenderness: There is no abdominal tenderness.  Musculoskeletal:     Cervical back: Normal range of motion and neck supple.     Left lower leg: Edema (mild pedal edema ) present.  Lymphadenopathy:     Cervical: No cervical adenopathy.  Skin:    General: Skin is warm and dry.     Capillary Refill: Capillary refill takes less than 2 seconds.     Findings: No rash.  Neurological:     General: No focal deficit present.     Mental Status: She is alert and oriented to person, place, and time.     Cranial Nerves: No cranial nerve deficit.     Coordination: Coordination normal.     Deep Tendon Reflexes: Reflexes normal.  Psychiatric:        Mood and Affect: Mood normal.        Behavior: Behavior normal.        Diabetic Foot Exam - Simple   Simple Foot Form Diabetic Foot exam was performed with the following findings: Yes 02/08/2021  9:59 AM  Visual Inspection Sensation Testing Pulse Check Comments Normal sensation bilaterally      Assessment/plan: 1. Acquired hypothyroidism, on Levothyoxine 25 mcg daily  - TSH - T4, free  2. Type 2 diabetes mellitus with insulin therapy ([place]) -has been to goal. Gave her a new meter as her has broken -foot exam done today -on statin/ARB -stopped ASA as discussed new guidelines and no  indication -UTD on vaccines -f/u in 6 months -refilled meds as needed.   - CBC with Differential/Platelet - Comprehensive metabolic panel - Hemoglobin A1c - blood glucose meter kit and supplies; Dispense based on patient and insurance preference. Use up to four times daily as directed. (FOR ICD-10 E10.9, E11.9).  Dispense: 1 each; Refill: 0  3. Hyperlipidemia associated with type 2 diabetes mellitus ([place])  - Lipid panel    This visit occurred during the SARS-CoV-2 public health emergency.  Safety protocols were in place, including screening questions prior to the visit, additional usage of staff PPE, and extensive cleaning of exam room while observing appropriate contact time as indicated for disinfecting solutions.     Return in about 6 months (around 08/11/2021) for diabetes/htn/bone scan .   Orma Flaming, MD [place]   02/08/2021

## 2021-02-10 ENCOUNTER — Encounter: Payer: Self-pay | Admitting: Family Medicine

## 2021-02-14 ENCOUNTER — Other Ambulatory Visit: Payer: Self-pay | Admitting: Physician Assistant

## 2021-02-15 ENCOUNTER — Other Ambulatory Visit: Payer: Self-pay

## 2021-02-15 MED ORDER — SYMBICORT 160-4.5 MCG/ACT IN AERO: 2.0000 | INHALATION_SPRAY | Freq: Two times a day (BID) | RESPIRATORY_TRACT | 0 refills | Status: DC

## 2021-03-02 ENCOUNTER — Other Ambulatory Visit: Payer: Self-pay | Admitting: Family Medicine

## 2021-03-08 ENCOUNTER — Encounter: Payer: Self-pay | Admitting: Psychiatry

## 2021-03-08 ENCOUNTER — Ambulatory Visit (INDEPENDENT_AMBULATORY_CARE_PROVIDER_SITE_OTHER): Payer: Medicare Other | Admitting: Psychiatry

## 2021-03-08 ENCOUNTER — Other Ambulatory Visit: Payer: Self-pay

## 2021-03-08 DIAGNOSIS — F5105 Insomnia due to other mental disorder: Secondary | ICD-10-CM

## 2021-03-08 DIAGNOSIS — F4310 Post-traumatic stress disorder, unspecified: Secondary | ICD-10-CM

## 2021-03-08 DIAGNOSIS — F331 Major depressive disorder, recurrent, moderate: Secondary | ICD-10-CM

## 2021-03-08 DIAGNOSIS — F4001 Agoraphobia with panic disorder: Secondary | ICD-10-CM

## 2021-03-08 MED ORDER — ESCITALOPRAM OXALATE 10 MG PO TABS: ORAL_TABLET | ORAL | 1 refills | Status: DC

## 2021-03-08 NOTE — Progress Notes (Signed)
Teresa Rice [id number] 1946/06/09 75 y.o.  Subjective:   Patient ID:  Teresa Rice is a 75 y.o. (DOB [birth year]) female.  Chief Complaint:  Chief Complaint  Patient presents with  . Follow-up  . Depression  . Anxiety    HPI Teresa Rice presents to the office today for follow-up of anxiety.    seen Dec 2020 without med changes though she had previously stopped SSRI AMA.  MD had concerns over LT panic,  05/25/20 appt with the following noted: No vaccine. [place] out of bed.  Hurt herself.  Needs eye surgery and worry about it and DM.  Fights off depression and anxiety.  Feels stressed.   No caffeine.  Not aware if something bothering her.  No full panic.  Not drowsy daytime. .  Also falling asleep on the couch though she doesn't want to do it.  Chest gets tight with anxiety. No weight loss off Paxil but no weight gain either.  Overall is not worse still and pleased with that. 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 Still doing well with the Xanax. Usually 1/2 mg TID and 1 mg HS.  Plan no med changes  11/24/20 appt with following noted: Some issues with D about her past and a son taken from her at 34 yo.  Recent conflict, 2 days before Christmas,  through me for a loop and said things she regrets to her D out of anger.   The 64 yo son will not listen to her or let her tell her side of the story.  Very confusing to me.  It is a hard thing to cope with..  She tried to get court records about it but was told it was a closed case. Good news November 14 was baptized.  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  [place].  Will be another great grandmother will be a little boy.   Due 03/17/20.   Can awaken with chest pressure in the morning but it stops short of full panic.  Did not have it this morning.    Plan no med changes  01/18/2021 phone call complaining of very depression, staying in bed until 11 AM not wanting to  drive, and cannot shut her mind off and asking about an antidepressant.  She had previously been on paroxetine but wanted to stop it.  Therefore we started duloxetine 30 mg 1 daily for 1 week and then 2 daily  03/08/2021 appointment with the following noted: Never started duloxetine bc read about SE of tremor and never took it. The got RX for paroxetine to restart and Had to stop it bc shaking and felt sick going into 3rd week on it.  Tremor too bad. Depression triggered by conversation between estranged son (stolen from me)  and her daughter.  Really upset her.   Now some days more depressed and some days she feels and functions better.  This problem sticks in her head and bothers her.  Thinks son said he hopes she dies.  Haven't talked to son in 47 years. More bad days than good.    New PCP Joya Gaskins, [place]  Past Psychiatric Medication Trials:  Paxil 60, sertraline forgetfulness  trazodone, She has been under our care since 1998 and has been on the same dosage of Xanax the whole time and most of the time took paroxetine 60  Review of Systems:  Review of Systems  HENT: Positive for hearing loss  and voice change.   Respiratory: Positive for chest tightness.   Cardiovascular: Negative for palpitations.  Musculoskeletal: Positive for back pain.  Neurological: Positive for tremors. Negative for weakness.  Psychiatric/Behavioral: Negative for agitation, behavioral problems, confusion, decreased concentration, dysphoric mood, hallucinations, self-injury, sleep disturbance and suicidal ideas. The patient is nervous/anxious. The patient is not hyperactive.     Medications: I have reviewed the patient's current medications.  Current Outpatient Medications  Medication Sig Dispense Refill  . Accu-Chek Softclix Lancets lancets     . ALPRAZolam (XANAX) 1 MG tablet 1/2 tablet TID and 1 at night 75 tablet 5  . azelastine (ASTELIN) 0.1 % nasal spray Place 1 spray into both nostrils in the morning.  Use in each nostril as directed 30 mL 2  . benzonatate (TESSALON) 200 MG capsule Take 1 capsule (200 mg total) by mouth 2 (two) times daily as needed for cough. 30 capsule 1  . blood glucose meter kit and supplies KIT Dispense based on patient and insurance preference. Use up to four times daily as directed. (FOR ICD-9 250.00, 250.01). 1 each 0  . blood glucose meter kit and supplies Dispense based on patient and insurance preference. Use up to four times daily as directed. (FOR ICD-10 E10.9, E11.9). 1 each 0  . cholecalciferol (VITAMIN D3) 25 MCG (1000 UNIT) tablet Take 1,000 Units by mouth daily.    . Dulaglutide (TRULICITY) 3 JS/9.7WY SOPN Inject 3 mg into the skin once a week. 12 mL 1  . escitalopram (LEXAPRO) 10 MG tablet 1/2 tablet nightly for 1 week, then 1 nightly 30 tablet 1  . fenofibrate 160 MG tablet Take 1 tablet (160 mg total) by mouth daily. 90 tablet 3  . fluticasone (FLONASE) 50 MCG/ACT nasal spray Place 2 sprays into both nostrils daily. 16 g 6  . furosemide (LASIX) 20 MG tablet TAKE 1 TABLET(20 MG) BY MOUTH DAILY 90 tablet 0  . glucose blood test strip Use as instructed 100 each 12  . insulin glargine (LANTUS SOLOSTAR) 100 UNIT/ML Solostar Pen Inject 10 Units into the skin daily. 15 mL 1  . Insulin Pen Needle (PEN NEEDLES) 32G X 5 MM MISC Please use new needle after very Lantus injection. 100 each 3  . levothyroxine (SYNTHROID) 25 MCG tablet TAKE 1 TABLET(25 MCG) BY MOUTH DAILY BEFORE BREAKFAST 90 tablet 1  . losartan (COZAAR) 100 MG tablet Take 1 tablet (100 mg total) by mouth daily. 90 tablet 3  . magnesium oxide (MAG-OX) 400 MG tablet Take 400 mg by mouth 2 (two) times daily.    . metFORMIN (GLUCOPHAGE) 1000 MG tablet Take 1 tablet (1,000 mg total) by mouth 2 (two) times daily with a meal. 180 tablet 3  . montelukast (SINGULAIR) 10 MG tablet Take 1 tablet (10 mg total) by mouth at bedtime. 90 tablet 1  . Multiple Vitamin (MULTIVITAMIN WITH MINERALS) TABS tablet Take 1 tablet  by mouth daily.    . Omega 3 1000 MG CAPS Take 1,000 mg by mouth daily.    Marland Kitchen omeprazole (PRILOSEC) 20 MG capsule Take 1 capsule by mouth once daily 90 capsule 1  . Polyethylene Glycol 3350 (MIRALAX PO) Take by mouth every evening.    . rosuvastatin (CRESTOR) 10 MG tablet Take 1 tablet (10 mg total) by mouth daily. 90 tablet 3  . SYMBICORT 160-4.5 MCG/ACT inhaler Inhale 2 puffs into the lungs 2 (two) times daily. 11 g 0  . VITAMIN E PO Take by mouth.     No  current facility-administered medications for this visit.    Medication Side Effects: None  Allergies:  Allergies  Allergen Reactions  . Celebrex [Celecoxib] Anaphylaxis  . Glipizide Itching  . Penicillins Other (See Comments)    "Burn marks from inside out"   . Sulfa Antibiotics Itching    Past Medical History:  Diagnosis Date  . Bladder cancer [facility]) first dx 2012   Recurrent Bladder Cancer--  (urologist-  dr Diona Fanti)  . Chronic constipation   . Full dentures   . GERD (gastroesophageal reflux disease)   . Hyperlipidemia   . Hypertension   . Mild intermittent asthma   . OA (osteoarthritis)    fingers   . Trigger finger of both hands    wear slints and special gloves at night  . Type 2 diabetes mellitus (HCC)     Family History  Problem Relation Age of Onset  . Early death Mother   . AAA (abdominal aortic aneurysm) Sister   . Cancer Father   . Diabetes Brother   . Cancer Brother     Social History   Socioeconomic History  . Marital status: Widowed    Spouse name: Not on file  . Number of children: 1  . Years of education: 28  . Highest education level: Not on file  Occupational History  . Occupation: retired  Tobacco Use  . Smoking status: Former Smoker    Years: 30.00    Types: Cigarettes    Quit date: 05/21/1994    Years since quitting: 26.8  . Smokeless tobacco: Never Used  Vaping Use  . Vaping Use: Never used  Substance and Sexual Activity  . Alcohol use: Yes    Comment: RARE  . Drug use:  No  . Sexual activity: Not on file  Other Topics Concern  . Not on file  Social History Narrative   Lives in an apartment, lives alone   One daughter   Right handed    Retired from [employer] work   [employer] level of education:  GED   Social Determinants of Radio broadcast assistant Strain: [place]   . Difficulty of Paying Living Expenses: Not hard at all  Food Insecurity: No Food Insecurity  . Worried About Charity fundraiser in the Last Year: Never true  . Ran Out of Food in the Last Year: Never true  Transportation Needs: No Transportation Needs  . Lack of Transportation (Medical): No  . Lack of Transportation (Non-Medical): No  Physical Activity: Inactive  . Days of Exercise per Week: 0 days  . Minutes of Exercise per Session: 0 min  Stress: No Stress Concern Present  . Feeling of Stress : Only a little  Social Connections: Moderately Isolated  . Frequency of Communication with Friends and Family: More than three times a week  . Frequency of Social Gatherings with Friends and Family: Never  . Attends Religious Services: More than 4 times per year  . Active Member of Clubs or Organizations: No  . Attends Archivist Meetings: Never  . Marital Status: Widowed  Intimate Partner Violence: Not At Risk  . Fear of Current or Ex-Partner: No  . Emotionally Abused: No  . Physically Abused: No  . Sexually Abused: No    Past Medical History, Surgical history, Social history, and Family history were reviewed and updated as appropriate.   2 new hearing aids.  Please see review of systems for further details on the patient's review from today.   Objective:  Physical Exam:  LMP  (LMP Unknown)   Physical Exam Constitutional:      General: She is not in acute distress.    Appearance: She is well-developed.  Musculoskeletal:        General: No deformity.  Neurological:     Mental Status: She is alert and oriented to person, place, and time.     Motor: No tremor.      Coordination: Coordination normal.     Gait: Gait normal.  Psychiatric:        Attention and Perception: Attention normal. She does not perceive auditory hallucinations.        Mood and Affect: Mood is anxious and depressed. Affect is not labile, blunt, angry, tearful or inappropriate.        Speech: Speech normal.        Behavior: Behavior normal.        Thought Content: Thought content normal. Thought content does not include homicidal or suicidal ideation. Thought content does not include homicidal or suicidal plan.        Cognition and Memory: Cognition normal.        Judgment: Judgment normal.     Comments: Insight fair.   No auditory or visual hallucinations. No delusions.      Lab Review:     Component Value Date/Time   NA 140 02/08/2021 1021   K 4.1 02/08/2021 1021   CL 103 02/08/2021 1021   CO2 28 02/08/2021 1021   GLUCOSE 145 (H) 02/08/2021 1021   BUN 19 02/08/2021 1021   CREATININE 0.61 02/08/2021 1021   CALCIUM 9.7 02/08/2021 1021   PROT 6.6 02/08/2021 1021   ALBUMIN 4.0 02/08/2021 1021   AST 29 02/08/2021 1021   ALT 27 02/08/2021 1021   ALKPHOS 62 02/08/2021 1021   BILITOT 0.4 02/08/2021 1021   GFRNONAA 87 (L) 05/24/2014 0620   GFRAA >90 05/24/2014 0620       Component Value Date/Time   WBC 6.2 02/08/2021 1021   RBC 4.52 02/08/2021 1021   HGB 11.5 (L) 02/08/2021 1021   HCT 34.8 (L) 02/08/2021 1021   PLT 309.0 02/08/2021 1021   MCV 77.0 (L) 02/08/2021 1021   MCH 26.2 05/24/2014 0620   MCHC 32.9 02/08/2021 1021   RDW 15.4 02/08/2021 1021   LYMPHSABS 2.3 02/08/2021 1021   MONOABS 0.4 02/08/2021 1021   EOSABS 0.3 02/08/2021 1021   BASOSABS 0.1 02/08/2021 1021    No results found for: POCLITH, LITHIUM   No results found for: PHENYTOIN, PHENOBARB, VALPROATE, CBMZ   .res Assessment: Plan:    Teresa Rice was seen today for follow-up, depression and anxiety.  Diagnoses and all orders for this visit:  Major depressive disorder, recurrent episode,  moderate (HCC) -     escitalopram (LEXAPRO) 10 MG tablet; 1/2 tablet nightly for 1 week, then 1 nightly  Panic disorder with agoraphobia -     escitalopram (LEXAPRO) 10 MG tablet; 1/2 tablet nightly for 1 week, then 1 nightly  PTSD (post-traumatic stress disorder) -     escitalopram (LEXAPRO) 10 MG tablet; 1/2 tablet nightly for 1 week, then 1 nightly  Insomnia due to mental condition  Greater than 50% of 30 min face to face time with patient was spent on counseling and coordination of care. We discussed Overall doing worse with family stressors.  More anxious and depressed.   Chronic avoidance and general fearfulness at baseline. Chronically easily stressed.   Sleep hygiene in detail.  Normalized some of the  problem.  She's getting enough sleep.  Delayed sleep phase is improved from last visit.  Insomnia is better.    Rec exercise.  Supportive therapy around dealing with recent family conflict.  Problems solving on how to deal with this issue from an estranged son and daughter participating in it..    Ambivalent about SSRI.  Trial low dose Lexapro 5 to 10 mg daily.  Does not think she can tolerate anxiety without Xanax.  A lot of benefit and no SE.  We discussed the short-term risks associated with benzodiazepines including sedation and increased fall risk among others.  Discussed long-term side effect risk including dependence, potential withdrawal symptoms, and the potential eventual dose-related risk of dementia.  But recent studies from 2020 dispute this association between benzodiazepines and dementia risk. Newer studies in 2020 do not support an association with dementia.  Satisfied with Xanax now..  Already at significant dose for age.  She has kept benefit.  Tolerated well. Continue Xanax as RX  FU 6 weeks  Lynder Parents, MD, DFAPA   Please see After Visit Summary for patient specific instructions.  Future Appointments  Date Time Provider [place]  05/08/2021 11:00  AM Orma Flaming, MD [facility]  05/23/2021  9:15 AM Cottle, Billey Co., MD [facility] None  08/11/2021 10:30 AM Orma Flaming, MD [facility]  12/21/2021 11:00 AM [facility] HEALTH COACH [facility]    No orders of the defined types were placed in this encounter.     -------------------------------

## 2021-03-08 NOTE — Patient Instructions (Signed)
1/2 tablet at night for 1 week then 1 tablet at night

## 2021-03-23 ENCOUNTER — Encounter: Payer: Self-pay | Admitting: Family Medicine

## 2021-03-24 ENCOUNTER — Other Ambulatory Visit: Payer: Self-pay

## 2021-03-24 MED ORDER — GLUCOSE BLOOD VI STRP: ORAL_STRIP | 12 refills | Status: DC

## 2021-04-11 ENCOUNTER — Other Ambulatory Visit: Payer: Self-pay

## 2021-04-11 ENCOUNTER — Telehealth: Payer: Self-pay

## 2021-04-11 ENCOUNTER — Encounter (HOSPITAL_BASED_OUTPATIENT_CLINIC_OR_DEPARTMENT_OTHER): Payer: Self-pay | Admitting: *Deleted

## 2021-04-11 ENCOUNTER — Emergency Department (HOSPITAL_BASED_OUTPATIENT_CLINIC_OR_DEPARTMENT_OTHER)
Admission: EM | Admit: 2021-04-11 | Discharge: 2021-04-11 | Disposition: A | Discharge status: Home / Self Care | Payer: Medicare Other | Attending: Emergency Medicine | Admitting: Emergency Medicine

## 2021-04-11 DIAGNOSIS — J4540 Moderate persistent asthma, uncomplicated: Secondary | ICD-10-CM | POA: Insufficient documentation

## 2021-04-11 DIAGNOSIS — E119 Type 2 diabetes mellitus without complications: Secondary | ICD-10-CM | POA: Diagnosis not present

## 2021-04-11 DIAGNOSIS — E039 Hypothyroidism, unspecified: Secondary | ICD-10-CM | POA: Diagnosis not present

## 2021-04-11 DIAGNOSIS — Z8551 Personal history of malignant neoplasm of bladder: Secondary | ICD-10-CM | POA: Diagnosis not present

## 2021-04-11 DIAGNOSIS — I10 Essential (primary) hypertension: Secondary | ICD-10-CM | POA: Insufficient documentation

## 2021-04-11 DIAGNOSIS — U071 COVID-19: Secondary | ICD-10-CM | POA: Diagnosis not present

## 2021-04-11 DIAGNOSIS — Z794 Long term (current) use of insulin: Secondary | ICD-10-CM | POA: Diagnosis not present

## 2021-04-11 DIAGNOSIS — R509 Fever, unspecified: Secondary | ICD-10-CM | POA: Diagnosis present

## 2021-04-11 DIAGNOSIS — Z87891 Personal history of nicotine dependence: Secondary | ICD-10-CM | POA: Insufficient documentation

## 2021-04-11 DIAGNOSIS — Z79899 Other long term (current) drug therapy: Secondary | ICD-10-CM | POA: Diagnosis not present

## 2021-04-11 DIAGNOSIS — Z7952 Long term (current) use of systemic steroids: Secondary | ICD-10-CM | POA: Diagnosis not present

## 2021-04-11 DIAGNOSIS — R000 Tachycardia, unspecified: Secondary | ICD-10-CM | POA: Insufficient documentation

## 2021-04-11 MED ORDER — PAXLOVID 20 X 150 MG & 10 X 100MG PO TBPK: 1.0000 | ORAL_TABLET | Freq: Two times a day (BID) | ORAL | 0 refills | Status: AC

## 2021-04-11 NOTE — ED Provider Notes (Signed)
[facility] EMERGENCY DEPT Provider Note   CSN: [id number] Arrival date & time: 04/11/21  1726     History Chief Complaint  Patient presents with  . Fever  . Covid Positive    Teresa Rice is a 75 y.o. female.  The patient has had a positive home COVID-19 test.  Her daughter also had a positive home COVID-19 test.  The patient was vaccinated x2 for COVID-19 but did not receive a booster.  She was also noted to be quite weak today and had difficulty getting up off her couch.  Her daughter felt like she was little bit wobbly as well.  The history is provided by the patient.  Influenza Presenting symptoms: cough, fatigue, fever, myalgias and sore throat   Presenting symptoms: no shortness of breath and no vomiting   Severity:  Moderate Onset quality:  Sudden Duration:  2 days Progression:  Worsening Chronicity:  New Relieved by:  Nothing Worsened by:  Nothing Ineffective treatments:  OTC medications Associated symptoms: chills and decreased appetite   Associated symptoms: no ear pain        Past Medical History:  Diagnosis Date  . Bladder cancer [facility]) first dx 2012   Recurrent Bladder Cancer--  (urologist-  dr Diona Fanti)  . Chronic constipation   . Full dentures   . GERD (gastroesophageal reflux disease)   . Hyperlipidemia   . Hypertension   . Mild intermittent asthma   . OA (osteoarthritis)    fingers   . Trigger finger of both hands    wear slints and special gloves at night  . Type 2 diabetes mellitus [facility])     Patient Active Problem List   Diagnosis Date Noted  . Osteopenia 05/05/2020  . History of bladder cancer 10/14/2019  . Metatarsalgia of both feet 08/10/2019  . Morton's neuroma of both feet 08/10/2019  . Seasonal allergies 05/06/2019  . Gastroesophageal reflux disease 05/06/2019  . Moderate persistent asthma without complication 79/48/0165  . Acquired hypothyroidism, on Levothyoxine 25 mcg daily 05/06/2019  . Chronic bilateral  low back pain without sciatica 05/06/2019  . Type 2 diabetes mellitus with insulin therapy ([place]) 05/06/2019  . Hyperlipidemia associated with type 2 diabetes mellitus ([place]) 05/06/2019  . Hypertension associated with diabetes ([place]) 05/06/2019  . PTSD (post-traumatic stress disorder) 11/10/2018  . Closed fracture of left distal radius 08/30/2016  . Bilateral carpal tunnel syndrome 10/11/2015  . Primary osteoarthritis of both hands 10/11/2015  . Trigger finger of both hands 10/11/2015    Past Surgical History:  Procedure Laterality Date  . BLADDER SURGERY  2012   in [place]   TURBT  . CHOLECYSTECTOMY  1970  . CYSTOSCOPY/RETROGRADE/URETEROSCOPY Left 05/24/2014   Procedure: CYSTOSCOPY/RETROGRADE/ URETEROSCOPY;  Surgeon: Jorja Loa, MD;  Location: [facility];  Service: Urology;  Laterality: Left;  . TRANSURETHRAL RESECTION OF BLADDER TUMOR N/A 05/24/2014   Procedure: TRANSURETHRAL RESECTION OF BLADDER TUMOR (TURBT) ;  Surgeon: Jorja Loa, MD;  Location: [facility];  Service: Urology;  Laterality: N/A;  . TRANSURETHRAL RESECTION OF BLADDER TUMOR N/A 08/04/2015   Procedure: TRANSURETHRAL RESECTION OF BLADDER TUMOR (TURBT);  Surgeon: Franchot Gallo, MD;  Location: [facility];  Service: Urology;  Laterality: N/A;     OB History   No obstetric history on file.     Family History  Problem Relation Age of Onset  . Early death Mother   . AAA (abdominal aortic aneurysm) Sister   . Cancer Father   . Diabetes  Brother   . Cancer Brother     Social History   Tobacco Use  . Smoking status: Former Smoker    Years: 30.00    Types: Cigarettes    Quit date: 05/21/1994    Years since quitting: 26.9  . Smokeless tobacco: Never Used  Vaping Use  . Vaping Use: Never used  Substance Use Topics  . Alcohol use: Yes    Comment: RARE  . Drug use: No    Home Medications Prior to Admission medications   Medication Sig Start Date End Date Taking? Authorizing Provider   ALPRAZolam Duanne Moron) 1 MG tablet 1/2 tablet TID and 1 at night 11/24/20  Yes Cottle, Billey Co., MD  azelastine (ASTELIN) 0.1 % nasal spray Place 1 spray into both nostrils in the morning. Use in each nostril as directed 11/09/20  Yes Orma Flaming, MD  cholecalciferol (VITAMIN D3) 25 MCG (1000 UNIT) tablet Take 1,000 Units by mouth daily.   Yes [provider]  escitalopram (LEXAPRO) 10 MG tablet 1/2 tablet nightly for 1 week, then 1 nightly 03/08/21  Yes Cottle, Billey Co., MD  fenofibrate 160 MG tablet Take 1 tablet (160 mg total) by mouth daily. 02/08/21  Yes Orma Flaming, MD  fluticasone [facility]) 50 MCG/ACT nasal spray Place 2 sprays into both nostrils daily. 04/18/20  Yes Orma Flaming, MD  furosemide (LASIX) 20 MG tablet TAKE 1 TABLET(20 MG) BY MOUTH DAILY 03/02/21  Yes Orma Flaming, MD  insulin glargine (LANTUS SOLOSTAR) 100 UNIT/ML Solostar Pen Inject 10 Units into the skin daily. 08/25/20  Yes Orma Flaming, MD  levothyroxine (SYNTHROID) 25 MCG tablet TAKE 1 TABLET(25 MCG) BY MOUTH DAILY BEFORE BREAKFAST 12/02/20  Yes Vivi Barrack, MD  losartan (COZAAR) 100 MG tablet Take 1 tablet (100 mg total) by mouth daily. 09/28/20  Yes Orma Flaming, MD  magnesium oxide (MAG-OX) 400 MG tablet Take 400 mg by mouth 2 (two) times daily.   Yes [provider]  metFORMIN (GLUCOPHAGE) 1000 MG tablet Take 1 tablet (1,000 mg total) by mouth 2 (two) times daily with a meal. 10/10/20  Yes Orma Flaming, MD  montelukast (SINGULAIR) 10 MG tablet Take 1 tablet (10 mg total) by mouth at bedtime. 11/09/20  Yes Orma Flaming, MD  Multiple Vitamin (MULTIVITAMIN WITH MINERALS) TABS tablet Take 1 tablet by mouth daily.   Yes [provider]  Nirmatrelvir & Ritonavir (PAXLOVID) 20 x 150 MG & 10 x 100MG TBPK Take 1 tablet by mouth in the morning and at bedtime for 5 days. 04/11/21 04/16/21 Yes Arnaldo Natal, MD  Omega 3 1000 MG CAPS Take 1,000 mg by mouth daily.   Yes [provider]  omeprazole (PRILOSEC) 20 MG capsule Take 1 capsule by mouth once daily 12/22/20  Yes Orma Flaming, MD  rosuvastatin (CRESTOR) 10 MG tablet Take 1 tablet (10 mg total) by mouth daily. 02/08/21  Yes Orma Flaming, MD  SYMBICORT 160-4.5 MCG/ACT inhaler Inhale 2 puffs into the lungs 2 (two) times daily. 02/15/21  Yes Orma Flaming, MD  VITAMIN E PO Take by mouth.   Yes [provider]  Accu-Chek Softclix Lancets lancets  08/28/19   [provider]  benzonatate (TESSALON) 200 MG capsule Take 1 capsule (200 mg total) by mouth 2 (two) times daily as needed for cough. 09/28/20   Orma Flaming, MD  blood glucose meter kit and supplies KIT Dispense based on patient and insurance preference. Use up to four times daily as directed. (FOR  ICD-9 250.00, 250.01). 08/28/19   Briscoe Deutscher, DO  blood glucose meter kit and supplies Dispense based on patient and insurance preference. Use up to four times daily as directed. (FOR ICD-10 E10.9, E11.9). 02/08/21   Orma Flaming, MD  Dulaglutide (TRULICITY) 3 CH/8.8FO SOPN Inject 3 mg into the skin once a week. 02/08/21   Orma Flaming, MD  glucose blood test strip Use as instructed 03/24/21   Orma Flaming, MD  Insulin Pen Needle (PEN NEEDLES) 32G X 5 MM MISC Please use new needle after very Lantus injection. 08/25/20   Orma Flaming, MD  Polyethylene Glycol 3350 (MIRALAX PO) Take by mouth every evening.    [provider]    Allergies    Celebrex [celecoxib], Glipizide, Penicillins, and Sulfa antibiotics  Review of Systems   Review of Systems  Constitutional: Positive for chills, decreased appetite, fatigue and fever.  HENT: Positive for sore throat. Negative for ear pain.   Eyes: Negative for pain and visual disturbance.  Respiratory: Positive for cough. Negative for shortness of breath.   Cardiovascular: Negative for chest pain and palpitations.  Gastrointestinal: Negative for abdominal pain and vomiting.  Genitourinary: Negative  for dysuria and hematuria.  Musculoskeletal: Positive for myalgias. Negative for arthralgias and back pain.  Skin: Negative for color change and rash.  Neurological: Negative for seizures and syncope.  All other systems reviewed and are negative.   Physical Exam Updated Vital Signs BP (!) 168/154 (BP Location: Left Arm)   Pulse (!) 112   Temp 99.3 F (37.4 C) (Oral)   Resp 16   Ht 5' 4"  (1.626 m)   Wt 77.6 kg   LMP  (LMP Unknown)   SpO2 98%   BMI 29.35 kg/m   Physical Exam Vitals and nursing note reviewed.  Constitutional:      General: She is not in acute distress.    Appearance: She is well-developed.  HENT:     Head: Normocephalic and atraumatic.  Eyes:     Conjunctiva/sclera: Conjunctivae normal.  Cardiovascular:     Rate and Rhythm: Regular rhythm. Tachycardia present.     Heart sounds: No murmur heard.   Pulmonary:     Effort: Pulmonary effort is normal. No respiratory distress.     Breath sounds: Normal breath sounds.  Abdominal:     Palpations: Abdomen is soft.     Tenderness: There is no abdominal tenderness.  Musculoskeletal:     Cervical back: Neck supple.  Skin:    General: Skin is warm and dry.  Neurological:     General: No focal deficit present.     Mental Status: She is alert.  Psychiatric:        Mood and Affect: Mood normal.     ED Results / Procedures / Treatments   Labs (all labs ordered are listed, but only abnormal results are displayed) Labs Reviewed - No data to display  EKG None  Radiology No results found.  Procedures Procedures   Medications Ordered in ED Medications - No data to display  ED Course  I have reviewed the triage vital signs and the nursing notes.  Pertinent labs & imaging results that were available during my care of the patient were reviewed by me and considered in my medical decision making (see chart for details).    MDM Rules/Calculators/A&P                          Bynum Bellows presents  2  days into her COVID-19 course.  She meets criteria for treatment based on her age and medical comorbidities.  The patient is not hypoxic.  I counseled her very carefully to monitor her symptoms.  She does possess a pulse oximeter.  She was advised on the risk and benefits of taking oral antiviral medication.  We talked about the course, expectant management, and symptom treatment.  Final Clinical Impression(s) / ED Diagnoses Final diagnoses:  [id number]    Rx / DC Orders ED Discharge Orders         Ordered    MyChart COVID-19 home monitoring program        04/11/21 1815    Temperature monitoring        04/11/21 1815    Nirmatrelvir & Ritonavir (PAXLOVID) 20 x 150 MG & 10 x 100MG TBPK  2 times daily        04/11/21 1816           Arnaldo Natal, MD 04/11/21 1820

## 2021-04-11 NOTE — Telephone Encounter (Signed)
I lvm on pt's vm to call the office back. I also called pt's daughter-Dawn, but was not able to lvm a message. The phone continued to ring.

## 2021-04-11 NOTE — ED Triage Notes (Signed)
Patient had a fever of 102.2 and tested herself for Covid and was positive.  Called her PCP and was advised to come here.

## 2021-04-11 NOTE — Telephone Encounter (Signed)
I spoke with the pt and her daughter to address concerns. Pt was told to go to Urgent Care or ED if symptoms worsened. Pt's daughter agreed.

## 2021-04-11 NOTE — Discharge Instructions (Signed)
Please monitor your pulse oximeter reading every day.  Return to the emergency department if your pulse oximeter reading is consistently below 92%.

## 2021-04-11 NOTE — Telephone Encounter (Signed)
Patient daughter called in and stated the patient tested positive for Covid and not sure what do for her mother. Patient stated she cant get out of the bed and also has been wetting the bed. Patient will not go to the ED. Patients daughter would like a call back. Dawn [phone].

## 2021-04-12 NOTE — Telephone Encounter (Signed)
Seen at ED and received paloxivid. Have sent my chart message as well.  Orma Flaming, MD [place]

## 2021-04-13 ENCOUNTER — Ambulatory Visit: Payer: Medicare Other | Admitting: Family Medicine

## 2021-04-21 ENCOUNTER — Encounter: Payer: Self-pay | Admitting: Family Medicine

## 2021-04-21 ENCOUNTER — Ambulatory Visit (INDEPENDENT_AMBULATORY_CARE_PROVIDER_SITE_OTHER): Payer: Medicare Other | Admitting: Family Medicine

## 2021-04-21 ENCOUNTER — Other Ambulatory Visit: Payer: Self-pay

## 2021-04-21 VITALS — BP 162/80 | HR 106 | Temp 98.5°F | Ht 64.0 in | Wt 169.4 lb

## 2021-04-21 DIAGNOSIS — E1159 Type 2 diabetes mellitus with other circulatory complications: Secondary | ICD-10-CM

## 2021-04-21 DIAGNOSIS — M8580 Other specified disorders of bone density and structure, unspecified site: Secondary | ICD-10-CM | POA: Diagnosis not present

## 2021-04-21 DIAGNOSIS — Z794 Long term (current) use of insulin: Secondary | ICD-10-CM

## 2021-04-21 DIAGNOSIS — E119 Type 2 diabetes mellitus without complications: Secondary | ICD-10-CM | POA: Diagnosis not present

## 2021-04-21 DIAGNOSIS — I152 Hypertension secondary to endocrine disorders: Secondary | ICD-10-CM

## 2021-04-21 MED ORDER — LEVOTHYROXINE SODIUM 25 MCG PO TABS: ORAL_TABLET | ORAL | 3 refills | Status: DC

## 2021-04-21 MED ORDER — OZEMPIC (0.25 OR 0.5 MG/DOSE) 2 MG/1.5ML ~~LOC~~ SOPN: 0.5000 mg | PEN_INJECTOR | SUBCUTANEOUS | 1 refills | Status: DC

## 2021-04-21 MED ORDER — LANTUS SOLOSTAR 100 UNIT/ML ~~LOC~~ SOPN: PEN_INJECTOR | SUBCUTANEOUS | 1 refills | Status: DC

## 2021-04-21 MED ORDER — HYDROCHLOROTHIAZIDE 25 MG PO TABS: 25.0000 mg | ORAL_TABLET | Freq: Every day | ORAL | 3 refills | Status: DC

## 2021-04-21 NOTE — Progress Notes (Signed)
Patient: Teresa Rice MRN: [id number] DOB: 06-Nov-1946 PCP: Orma Flaming, MD     Subjective:  Chief Complaint  Patient presents with  . Diabetes  . Hypertension    She states that her B/P medication is not helping, it is always elevated.     HPI: The patient is a 75 y.o. female who presents today for DM/HTN/Bone Density. Pt is feeling a lot better post Covid. Just had covid 11+ days ago. Was put on paxlovid and doing well.   Hypertension: Here for follow up of hypertension.  Currently on cozaar 100mg /day. Readings are 16-170/80-90.  Takes medication as prescribed and denies any side effects. Exercise includes none. Weight has been stable. Denies any chest pain, headaches, shortness of breath, vision changes, swelling in lower extremities. her blood pressure has been running higher than normal over the last few months.   Diabetes: Patient is here for follow up of type 2 diabetes. First [phone]. Currently on the following medications: metformin 1000mg  BIDand trulicity 3mg /week. She is on lantus 14 units at bedtime: fasting sugars 125-165. Takes medications as prescribed. Last A1C was7.7Currently NOTexercising and following diabetic diet. Denies any hypoglycemic events. Denies any vision changes, nausea, vomiting, abdominal pain, ulcers/paraesthesia in feet, polyuria, polydipsia or polyphagia. Denies any chest pain, shortness of breath.wanting to change to ozempic as she thinks her body will respond better to this.   Needs dexa.   Review of Systems  Constitutional: Negative for chills, fatigue and fever.  HENT: Negative for dental problem, ear pain, hearing loss and trouble swallowing.   Eyes: Negative for visual disturbance.  Respiratory: Negative for cough, chest tightness and shortness of breath.   Cardiovascular: Negative for chest pain, palpitations and leg swelling.  Gastrointestinal: Negative for abdominal pain, blood in stool, diarrhea and nausea.  Endocrine:  Negative for cold intolerance, polydipsia, polyphagia and polyuria.  Genitourinary: Negative for dysuria and hematuria.  Musculoskeletal: Negative for arthralgias.  Skin: Negative for rash.  Neurological: Negative for dizziness and headaches.  Psychiatric/Behavioral: Negative for dysphoric mood and sleep disturbance. The patient is not nervous/anxious.     Allergies Patient is allergic to celebrex [celecoxib], glipizide, penicillins, and sulfa antibiotics.  Past Medical History Patient  has a past medical history of Bladder cancer ([place]) (first dx 2012), Chronic constipation, Full dentures, GERD (gastroesophageal reflux disease), Hyperlipidemia, Hypertension, Mild intermittent asthma, OA (osteoarthritis), Trigger finger of both hands, and Type 2 diabetes mellitus ([place]).  Surgical History Patient  has a past surgical history that includes Bladder surgery (2012   in [facility]); Cholecystectomy (1970); Transurethral resection of bladder tumor (N/A, 05/24/2014); Cystoscopy/retrograde/ureteroscopy (Left, 05/24/2014); and Transurethral resection of bladder tumor (N/A, 08/04/2015).  Family History Pateint's family history includes AAA (abdominal aortic aneurysm) in her sister; Cancer in her brother and father; Diabetes in her brother; Early death in her mother.  Social History Patient  reports that she quit smoking about 26 years ago. Her smoking use included cigarettes. She quit after 30.00 years of use. She has never used smokeless tobacco. She reports current alcohol use. She reports that she does not use drugs.    Objective: Vitals:   04/21/21 1124 04/21/21 1216  BP: (!) 162/78 (!) 162/80  Pulse: (!) 106   Temp: 98.5 F (36.9 C)   TempSrc: Temporal   SpO2: 97%   Weight: 169 lb 6.4 oz (76.8 kg)   Height: 5\' 4"  (1.626 m)     Body mass index is 29.08 kg/m.  Physical Exam Vitals reviewed.  Constitutional:  Appearance: Normal appearance. She is well-developed. She is obese.  HENT:      Head: Normocephalic and atraumatic.     Right Ear: External ear normal.     Left Ear: External ear normal.  Eyes:     Conjunctiva/sclera: Conjunctivae normal.     Pupils: Pupils are equal, round, and reactive to light.  Neck:     Thyroid: No thyromegaly.  Cardiovascular:     Rate and Rhythm: Normal rate and regular rhythm.     Heart sounds: Normal heart sounds. No murmur heard.   Pulmonary:     Effort: Pulmonary effort is normal.     Breath sounds: Normal breath sounds.  Abdominal:     General: Bowel sounds are normal. There is no distension.     Palpations: Abdomen is soft.     Tenderness: There is no abdominal tenderness.  Musculoskeletal:     Cervical back: Normal range of motion and neck supple.  Lymphadenopathy:     Cervical: No cervical adenopathy.  Skin:    General: Skin is warm and dry.     Capillary Refill: Capillary refill takes less than 2 seconds.     Findings: No rash.  Neurological:     Mental Status: She is alert and oriented to person, place, and time. Mental status is at baseline.     Cranial Nerves: No cranial nerve deficit.     Coordination: Coordination normal.     Deep Tendon Reflexes: Reflexes normal.  Psychiatric:        Mood and Affect: Mood normal.        Behavior: Behavior normal.        Assessment/plan: 1. Hypertension associated with diabetes ([place]) Above goal. Continue losartan 100mg  daily and we are going to add on hctz. Will have her start on 12.5mg  daily x 1-2 weeks and continue to keep a log. If still >140/80, would increase to 25mg /day. Will have her f/u in one month for recheck with labs.   2. Type 2 diabetes mellitus with insulin therapy ([place]) -due for a1c recheck in a month, not eating correctly and she knows this.  -increase insulin up to 15 units with slow titration by 1 unit every 3 days to achieve FBS of 100. Do not go over 20 units until follow up.  -I will change her over to ozempic from trulicity, but discussed this is the  same drug and works the same way. Put her on .5mg /weekly dose.  -continue metformin 1000mg  BID -can not tolerate SLGT2-I.  -really needs to work on diet/exercise.  -asked her to bring sugar log for follow up for Dr.Andy to review.   3. Osteopenia, unspecified location  - DG Bone Density; Future    Return in about 1 month (around 05/22/2021) for blood pressure/labs with dr. Jonni Sanger. (needs female MD) .   Orma Flaming, MD [place]   04/21/2021

## 2021-04-21 NOTE — Patient Instructions (Addendum)
For your blood pressure, start out with 1/2 a tab of new medication called hctz. If your readings are still >130/80 after 1-2 weeks, increase to a full pill. Continue the losartan daily.  Make sure and drink plenty of water on this and we will check this in one month with labs.   Diabetes I stopped the trulicity and we can try ozempic. Same directions. Will take this weekly, but only .5mg /week. they work the same way so im not expecting much of a change.   Increase lantus to 15 units/nightly. May need a little bit more titration. Goal fasting blood sugars are 100.    -ordered bone scan for you.   F/u in one month with dr. Jonni Sanger. For bp/labs.  I will miss you! Dr. Rogers Blocker

## 2021-04-22 ENCOUNTER — Other Ambulatory Visit: Payer: Self-pay | Admitting: Family Medicine

## 2021-04-26 DIAGNOSIS — Z8551 Personal history of malignant neoplasm of bladder: Secondary | ICD-10-CM | POA: Diagnosis not present

## 2021-05-08 ENCOUNTER — Encounter: Payer: Self-pay | Admitting: Psychiatry

## 2021-05-08 ENCOUNTER — Ambulatory Visit (INDEPENDENT_AMBULATORY_CARE_PROVIDER_SITE_OTHER): Payer: Medicare Other | Admitting: Psychiatry

## 2021-05-08 ENCOUNTER — Ambulatory Visit: Payer: Medicare Other | Admitting: Family Medicine

## 2021-05-08 ENCOUNTER — Other Ambulatory Visit: Payer: Self-pay

## 2021-05-08 DIAGNOSIS — F5105 Insomnia due to other mental disorder: Secondary | ICD-10-CM

## 2021-05-08 DIAGNOSIS — F4310 Post-traumatic stress disorder, unspecified: Secondary | ICD-10-CM | POA: Diagnosis not present

## 2021-05-08 DIAGNOSIS — F331 Major depressive disorder, recurrent, moderate: Secondary | ICD-10-CM

## 2021-05-08 DIAGNOSIS — F4001 Agoraphobia with panic disorder: Secondary | ICD-10-CM | POA: Diagnosis not present

## 2021-05-08 MED ORDER — ESCITALOPRAM OXALATE 10 MG PO TABS: 10.0000 mg | ORAL_TABLET | Freq: Every day | ORAL | 1 refills | Status: DC

## 2021-05-08 MED ORDER — ALPRAZOLAM 1 MG PO TABS: ORAL_TABLET | ORAL | 5 refills | Status: DC

## 2021-05-08 NOTE — Progress Notes (Signed)
Teresa Rice [id number] [birth year] 75 y.o.  Subjective:   Patient ID:  Teresa Rice is a 75 y.o. (DOB 14-Apr-1946) female.  Chief Complaint:  Chief Complaint  Patient presents with   Follow-up   Major depressive disorder, recurrent episode, moderate ([place])   Anxiety    HPI Teresa Rice presents to the office today for follow-up of anxiety.    seen Dec 2020 without med changes though she had previously stopped SSRI AMA.  MD had concerns over LT panic,  05/25/20 appt with the following noted: No vaccine. [place] out of bed.  Hurt herself.  Needs eye surgery and worry about it and DM.  Fights off depression and anxiety.  Feels stressed.   No caffeine.  Not aware if something bothering her.  No full panic.  Not drowsy daytime. .  Also falling asleep on the couch though she doesn't want to do it.  Chest gets tight with anxiety. No weight loss off Paxil but no weight gain either.  Overall is not worse still and pleased with that. 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 Still doing well with the Xanax. Usually 1/2 mg TID and 1 mg HS.  Plan no med changes  11/24/20 appt with following noted: Some issues with D about her past and a son taken from her at 72 yo.  Recent conflict, 2 days before Christmas,  through me for a loop and said things she regrets to her D out of anger.   The 61 yo son will not listen to her or let her tell her side of the story.  Very confusing to me.  It is a hard thing to cope with..  She tried to get court records about it but was told it was a closed case. Good news November 14 was baptized.  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  Wynona.  Will be another great grandmother will be a little boy.   Due 03/17/20.   Can awaken with chest pressure in the morning but it stops short of full panic.  Did not have it this morning.    Plan no med changes  01/18/2021 phone call complaining of very  depression, staying in bed until 11 AM not wanting to drive, and cannot shut her mind off and asking about an antidepressant.  She had previously been on paroxetine but wanted to stop it.  Therefore we started duloxetine 30 mg 1 daily for 1 week and then 2 daily  03/08/2021 appointment with the following noted: Never started duloxetine bc read about SE of tremor and never took it. The got RX for paroxetine to restart and Had to stop it bc shaking and felt sick going into 3rd week on it.  Tremor too bad. Depression triggered by conversation between estranged son (stolen from me)  and her daughter.  Really upset her.   Now some days more depressed and some days she feels and functions better.  This problem sticks in her head and bothers her.  Thinks son said he hopes she dies.  Haven't talked to son in 40 years. More bad days than good.   Plan rec low dose 10 Lexapro  05/08/2021 appt noted: Taken Lexapro.  Calmer and don't feel upset.  I feel good.  No SE with the med.  Took a little while to help.  Doesn't talk about son.  That was the root of my problem. Patient reports stable mood and denies  depressed or irritable moods.  Patient denies any recent difficulty with anxiety.  Patient denies difficulty with sleep initiation or maintenance. Denies appetite disturbance.  Patient reports that energy and motivation have been good.  Patient denies any difficulty with concentration.  Patient denies any suicidal ideation. Loves 2 great grand-sons [name] 2, [name] 9 weeks. Got Covid since here. Cough resolved but still a little tired.  New PCP Joya Gaskins, [place]  Past Psychiatric Medication Trials:  Paxil 60, sertraline forgetfulness, Lexapro 10  trazodone, She has been under our care since 1998 and has been on the same dosage of Xanax the whole time and most of the time took paroxetine 60  Review of Systems:  Review of Systems  HENT:  Positive for hearing loss and voice change.   Cardiovascular:  Negative  for palpitations.  Musculoskeletal:  Positive for back pain.  Neurological:  Positive for tremors. Negative for weakness.  Psychiatric/Behavioral:  Negative for agitation, behavioral problems, confusion, decreased concentration, dysphoric mood, hallucinations, self-injury, sleep disturbance and suicidal ideas. The patient is nervous/anxious. The patient is not hyperactive.    Medications: I have reviewed the patient's current medications.  Current Outpatient Medications  Medication Sig Dispense Refill   Accu-Chek Softclix Lancets lancets      ALPRAZolam (XANAX) 1 MG tablet 1/2 tablet TID and 1 at night 75 tablet 5   azelastine (ASTELIN) 0.1 % nasal spray Place 1 spray into both nostrils in the morning. Use in each nostril as directed 30 mL 2   blood glucose meter kit and supplies Dispense based on patient and insurance preference. Use up to four times daily as directed. (FOR ICD-10 E10.9, E11.9). 1 each 0   cholecalciferol (VITAMIN D3) 25 MCG (1000 UNIT) tablet Take 1,000 Units by mouth daily.     escitalopram (LEXAPRO) 10 MG tablet 1/2 tablet nightly for 1 week, then 1 nightly 30 tablet 1   fenofibrate 160 MG tablet Take 1 tablet (160 mg total) by mouth daily. 90 tablet 3   fluticasone (FLONASE) 50 MCG/ACT nasal spray Place 2 sprays into both nostrils daily. 16 g 6   glucose blood test strip Use as instructed 100 each 12   hydrochlorothiazide (HYDRODIURIL) 25 MG tablet Take 1 tablet (25 mg total) by mouth daily. 90 tablet 3   insulin glargine (LANTUS SOLOSTAR) 100 UNIT/ML Solostar Pen Inject 15 units [place] daily at night 15 mL 1   Insulin Pen Needle (PEN NEEDLES) 32G X 5 MM MISC Please use new needle after very Lantus injection. 100 each 3   levothyroxine (SYNTHROID) 25 MCG tablet Take one pill daily for thyroid 90 tablet 3   losartan (COZAAR) 100 MG tablet Take 1 tablet (100 mg total) by mouth daily. 90 tablet 3   magnesium oxide (MAG-OX) 400 MG tablet Take 400 mg by mouth 2 (two) times daily.      metFORMIN (GLUCOPHAGE) 1000 MG tablet Take 1 tablet (1,000 mg total) by mouth 2 (two) times daily with a meal. 180 tablet 3   montelukast (SINGULAIR) 10 MG tablet Take 1 tablet (10 mg total) by mouth at bedtime. 90 tablet 1   Multiple Vitamin (MULTIVITAMIN WITH MINERALS) TABS tablet Take 1 tablet by mouth daily.     Omega 3 1000 MG CAPS Take 1,000 mg by mouth daily.     omeprazole (PRILOSEC) 20 MG capsule Take 1 capsule by mouth once daily 90 capsule 1   Polyethylene Glycol 3350 (MIRALAX PO) Take by mouth every evening.  rosuvastatin (CRESTOR) 10 MG tablet Take 1 tablet (10 mg total) by mouth daily. 90 tablet 3   Semaglutide,0.25 or 0.5MG/DOS, (OZEMPIC, 0.25 OR 0.5 MG/DOSE,) 2 MG/1.5ML SOPN Inject 0.5 mg into the skin once a week. 4.5 mL 1   SYMBICORT 160-4.5 MCG/ACT inhaler Inhale 2 puffs by mouth twice daily 11 g 0   VITAMIN E PO Take by mouth.     No current facility-administered medications for this visit.    Medication Side Effects: None  Allergies:  Allergies  Allergen Reactions   Celebrex [Celecoxib] Anaphylaxis   Glipizide Itching   Penicillins Other (See Comments)    "Burn marks from inside out"    Sulfa Antibiotics Itching    Past Medical History:  Diagnosis Date   Bladder cancer ([place]) first dx 2012   Recurrent Bladder Cancer--  (urologist-  dr Diona Fanti)   Chronic constipation    Full dentures    GERD (gastroesophageal reflux disease)    Hyperlipidemia    Hypertension    Mild intermittent asthma    OA (osteoarthritis)    fingers    Trigger finger of both hands    wear slints and special gloves at night   Type 2 diabetes mellitus ([place])     Family History  Problem Relation Age of Onset   Early death Mother    AAA (abdominal aortic aneurysm) Sister    Cancer Father    Diabetes Brother    Cancer Brother     Social History   Socioeconomic History   Marital status: Widowed    Spouse name: Not on file   Number of children: 1   Years of education:  85   Highest education level: Not on file  Occupational History   Occupation: retired  Tobacco Use   Smoking status: Former    Years: 30.00    Pack years: 0.00    Types: Cigarettes    Quit date: 05/21/1994    Years since quitting: 26.9   Smokeless tobacco: Never  Vaping Use   Vaping Use: Never used  Substance and Sexual Activity   Alcohol use: Yes    Comment: RARE   Drug use: No   Sexual activity: Not on file  Other Topics Concern   Not on file  Social History Narrative   Lives in an apartment, lives alone   One daughter   Right handed    Retired from [employer] work   Highest level of education:  GED   Social Determinants of Radio broadcast assistant Strain: Low Risk    Difficulty of Paying Living Expenses: Not hard at all  Food Insecurity: No Food Insecurity   Worried About Charity fundraiser in the Last Year: Never true   Arboriculturist in the Last Year: Never true  Transportation Needs: No Transportation Needs   Lack of Transportation (Medical): No   Lack of Transportation (Non-Medical): No  Physical Activity: Inactive   Days of Exercise per Week: 0 days   Minutes of Exercise per Session: 0 min  Stress: No Stress Concern Present   Feeling of Stress : Only a little  Social Connections: Moderately Isolated   Frequency of Communication with Friends and Family: More than three times a week   Frequency of Social Gatherings with Friends and Family: Never   Attends Religious Services: More than 4 times per year   Active Member of [employer] or Organizations: No   Attends Archivist Meetings: Never   Marital  Status: Widowed  Human resources officer Violence: Not At Risk   Fear of Current or Ex-Partner: No   Emotionally Abused: No   Physically Abused: No   Sexually Abused: No    Past Medical History, Surgical history, Social history, and Family history were reviewed and updated as appropriate.   2 new hearing aids.  Please see review of systems for further  details on the patient's review from today.   Objective:   Physical Exam:  LMP  (LMP Unknown)   Physical Exam Constitutional:      General: She is not in acute distress. Musculoskeletal:        General: No deformity.  Neurological:     Mental Status: She is alert and oriented to person, place, and time.     Coordination: Coordination normal.     Gait: Gait normal.  Psychiatric:        Attention and Perception: Attention normal. She does not perceive auditory hallucinations.        Mood and Affect: Mood is not anxious or depressed. Affect is not labile, blunt, angry, tearful or inappropriate.        Speech: Speech normal.        Behavior: Behavior normal.        Thought Content: Thought content normal. Thought content does not include homicidal or suicidal ideation. Thought content does not include homicidal or suicidal plan.        Cognition and Memory: Cognition normal.        Judgment: Judgment normal.     Comments: Insight fair.   No auditory or visual hallucinations. No delusions.     Lab Review:     Component Value Date/Time   NA 140 02/08/2021 1021   K 4.1 02/08/2021 1021   CL 103 02/08/2021 1021   CO2 28 02/08/2021 1021   GLUCOSE 145 (H) 02/08/2021 1021   BUN 19 02/08/2021 1021   CREATININE 0.61 02/08/2021 1021   CALCIUM 9.7 02/08/2021 1021   PROT 6.6 02/08/2021 1021   ALBUMIN 4.0 02/08/2021 1021   AST 29 02/08/2021 1021   ALT 27 02/08/2021 1021   ALKPHOS 62 02/08/2021 1021   BILITOT 0.4 02/08/2021 1021   GFRNONAA 87 (L) 05/24/2014 0620   GFRAA >90 05/24/2014 0620       Component Value Date/Time   WBC 6.2 02/08/2021 1021   RBC 4.52 02/08/2021 1021   HGB 11.5 (L) 02/08/2021 1021   HCT 34.8 (L) 02/08/2021 1021   PLT 309.0 02/08/2021 1021   MCV 77.0 (L) 02/08/2021 1021   MCH 26.2 05/24/2014 0620   MCHC 32.9 02/08/2021 1021   RDW 15.4 02/08/2021 1021   LYMPHSABS 2.3 02/08/2021 1021   MONOABS 0.4 02/08/2021 1021   EOSABS 0.3 02/08/2021 1021   BASOSABS  0.1 02/08/2021 1021    No results found for: POCLITH, LITHIUM   No results found for: PHENYTOIN, PHENOBARB, VALPROATE, CBMZ   .res Assessment: Plan:    Yasmyn was seen today for follow-up, major depressive disorder, recurrent episode, moderate ([facility]) and anxiety.  Diagnoses and all orders for this visit:  Major depressive disorder, recurrent episode, moderate ([facility])  Panic disorder with agoraphobia  PTSD (post-traumatic stress disorder)  Insomnia due to mental condition Greater than 50% of 30 min face to face time with patient was spent on counseling and coordination of care. We discussed Overall doing worse with family stressors.  Less anxious and depressed with Lexapro.   Chronic avoidance and general fearfulness at baseline. Chronically easily  stressed.   Sleep hygiene in detail.  Normalized some of the problem.  She's getting enough sleep.  Delayed sleep phase is improved from last visit.  Insomnia is better.    Rec exercise.  Supportive therapy around dealing with recent family conflict.  Problems solving on how to deal with this issue from an estranged son and daughter participating in it..    Benefit with Lexapro 10 mg daily. Don't taper for a few months.  Does not think she can tolerate anxiety without Xanax.  A lot of benefit and no SE.  We discussed the short-term risks associated with benzodiazepines including sedation and increased fall risk among others.  Discussed long-term side effect risk including dependence, potential withdrawal symptoms, and the potential eventual dose-related risk of dementia.  But recent studies from 2020 dispute this association between benzodiazepines and dementia risk. Newer studies in 2020 do not support an association with dementia.  Satisfied with Xanax now..  Already at significant dose for age.  She has kept benefit.  Tolerated well. Continue Xanax as RX  FU 3 mos  Lynder Parents, MD, DFAPA   Please see After Visit Summary for patient  specific instructions.  Future Appointments  Date Time Provider [place]  06/05/2021  1:00 PM Leamon Arnt, MD [facility]  12/21/2021 11:00 AM [facility] HEALTH COACH [facility]    No orders of the defined types were placed in this encounter.     -------------------------------

## 2021-05-23 ENCOUNTER — Ambulatory Visit: Payer: Medicare Other | Admitting: Psychiatry

## 2021-06-05 ENCOUNTER — Encounter: Payer: Medicare Other | Admitting: Family Medicine

## 2021-07-29 ENCOUNTER — Other Ambulatory Visit: Payer: Self-pay | Admitting: Family Medicine

## 2021-07-29 DIAGNOSIS — K219 Gastro-esophageal reflux disease without esophagitis: Secondary | ICD-10-CM

## 2021-08-07 ENCOUNTER — Encounter: Payer: Medicare Other | Admitting: Physician Assistant

## 2021-08-09 ENCOUNTER — Ambulatory Visit: Payer: Medicare Other | Admitting: Psychiatry

## 2021-08-11 ENCOUNTER — Ambulatory Visit: Payer: Medicare Other | Admitting: Family Medicine

## 2021-08-14 ENCOUNTER — Ambulatory Visit (INDEPENDENT_AMBULATORY_CARE_PROVIDER_SITE_OTHER): Payer: Medicare Other | Admitting: Family Medicine

## 2021-08-14 ENCOUNTER — Other Ambulatory Visit: Payer: Self-pay

## 2021-08-14 ENCOUNTER — Encounter: Payer: Medicare Other | Admitting: Family Medicine

## 2021-08-14 ENCOUNTER — Encounter: Payer: Self-pay | Admitting: Family Medicine

## 2021-08-14 VITALS — BP 146/80 | HR 97 | Temp 97.4°F | Ht 63.0 in | Wt 165.8 lb

## 2021-08-14 DIAGNOSIS — Z23 Encounter for immunization: Secondary | ICD-10-CM

## 2021-08-14 DIAGNOSIS — E1165 Type 2 diabetes mellitus with hyperglycemia: Secondary | ICD-10-CM

## 2021-08-14 DIAGNOSIS — J4540 Moderate persistent asthma, uncomplicated: Secondary | ICD-10-CM

## 2021-08-14 DIAGNOSIS — J208 Acute bronchitis due to other specified organisms: Secondary | ICD-10-CM | POA: Diagnosis not present

## 2021-08-14 DIAGNOSIS — B9689 Other specified bacterial agents as the cause of diseases classified elsewhere: Secondary | ICD-10-CM

## 2021-08-14 DIAGNOSIS — E039 Hypothyroidism, unspecified: Secondary | ICD-10-CM | POA: Diagnosis not present

## 2021-08-14 DIAGNOSIS — Z794 Long term (current) use of insulin: Secondary | ICD-10-CM | POA: Diagnosis not present

## 2021-08-14 DIAGNOSIS — E1159 Type 2 diabetes mellitus with other circulatory complications: Secondary | ICD-10-CM

## 2021-08-14 DIAGNOSIS — E1169 Type 2 diabetes mellitus with other specified complication: Secondary | ICD-10-CM | POA: Diagnosis not present

## 2021-08-14 DIAGNOSIS — E785 Hyperlipidemia, unspecified: Secondary | ICD-10-CM

## 2021-08-14 DIAGNOSIS — D509 Iron deficiency anemia, unspecified: Secondary | ICD-10-CM

## 2021-08-14 DIAGNOSIS — IMO0002 Reserved for concepts with insufficient information to code with codable children: Secondary | ICD-10-CM

## 2021-08-14 DIAGNOSIS — I152 Hypertension secondary to endocrine disorders: Secondary | ICD-10-CM

## 2021-08-14 LAB — POCT GLYCOSYLATED HEMOGLOBIN (HGB A1C): Hemoglobin A1C: 10 % — AB (ref 4.0–5.6)

## 2021-08-14 LAB — CBC WITH DIFFERENTIAL/PLATELET
Basophils Absolute: 0.1 10*3/uL (ref 0.0–0.1)
Basophils Relative: 0.7 % (ref 0.0–3.0)
Eosinophils Absolute: 0.2 10*3/uL (ref 0.0–0.7)
Eosinophils Relative: 2 % (ref 0.0–5.0)
HCT: 34.4 % — ABNORMAL LOW (ref 36.0–46.0)
Hemoglobin: 11.4 g/dL — ABNORMAL LOW (ref 12.0–15.0)
Lymphocytes Relative: 24.9 % (ref 12.0–46.0)
Lymphs Abs: 2 10*3/uL (ref 0.7–4.0)
MCHC: 33 g/dL (ref 30.0–36.0)
MCV: 76 fl — ABNORMAL LOW (ref 78.0–100.0)
Monocytes Absolute: 0.4 10*3/uL (ref 0.1–1.0)
Monocytes Relative: 4.7 % (ref 3.0–12.0)
Neutro Abs: 5.5 10*3/uL (ref 1.4–7.7)
Neutrophils Relative %: 67.7 % (ref 43.0–77.0)
Platelets: 425 10*3/uL — ABNORMAL HIGH (ref 150.0–400.0)
RBC: 4.53 Mil/uL (ref 3.87–5.11)
RDW: 14.8 % (ref 11.5–15.5)
WBC: 8.2 10*3/uL (ref 4.0–10.5)

## 2021-08-14 MED ORDER — SYMBICORT 160-4.5 MCG/ACT IN AERO: 2.0000 | INHALATION_SPRAY | Freq: Two times a day (BID) | RESPIRATORY_TRACT | 11 refills | Status: DC

## 2021-08-14 MED ORDER — LANTUS SOLOSTAR 100 UNIT/ML ~~LOC~~ SOPN: PEN_INJECTOR | SUBCUTANEOUS | 1 refills | Status: DC

## 2021-08-14 MED ORDER — BENZONATATE 200 MG PO CAPS: 200.0000 mg | ORAL_CAPSULE | Freq: Two times a day (BID) | ORAL | 0 refills | Status: DC | PRN

## 2021-08-14 MED ORDER — SEMAGLUTIDE (1 MG/DOSE) 4 MG/3ML ~~LOC~~ SOPN: 1.0000 mg | PEN_INJECTOR | SUBCUTANEOUS | 3 refills | Status: DC

## 2021-08-14 MED ORDER — AZITHROMYCIN 250 MG PO TABS: ORAL_TABLET | ORAL | 0 refills | Status: DC

## 2021-08-14 MED ORDER — AMLODIPINE BESYLATE 10 MG PO TABS: 10.0000 mg | ORAL_TABLET | Freq: Every day | ORAL | 3 refills | Status: DC

## 2021-08-14 NOTE — Progress Notes (Signed)
Subjective  CC:  Chief Complaint  Patient presents with   Transitions Of Care   Diabetes   Hypertension   Hyperlipidemia    HPI: Teresa Rice is a 75 y.o. female who presents to [facility] [place] today to establish care with me as a new patient.   She has the following concerns or needs: Complicated pleasant widower originally from [place] for [facility]. Reviewed old records from multiple specialists and prior PCPs. Reivewed labs over last 3 years.  Diabetes follow up: Her diabetic control is reported as Worse. Sugars are in the 200s in the am. In march her meds were changed; went from high dose trulicity to 9.16OM weekly of ozempic (unclear reasongs). Also on met 1000 bid and lantus 20 at ngiht. Diet needs improvement. Hasn't seen nutritionist. Feels tired and worn down. Needs eye exam.  She denies exertional CP or SOB or symptomatic hypoglycemia. She denies foot sores or paresthesias. Due eye exam.  HTN: she feels she having side effects from hctz: headache, fatigue and rash. She is alos on losartan. Bp is slightly elevated here today. No cp or sob HLD on statin with good LDL 6 mnths ago. Asthma: on simbicort Co URI with productive cough x 2-3 weeks. Not resolveing. No fevers or sinus pain. Denies wheezing. Had covid earlier this year. Denies copd. Former long term smoker: quit over 20 years ago. No cp  Eligible for flu shot She is on low dose synthroid per prior pcp. ?subclinical hypothyroidism.   Immunization History  Administered Date(s) Administered   Fluad Quad(high Dose 65+) 08/12/2019, 08/10/2020, 08/14/2021   PFIZER(Purple Top)SARS-COV-2 Vaccination 08/17/2020, 09/09/2020   Pneumococcal Conjugate-13 07/09/2019   Pneumococcal Polysaccharide-23 08/10/2020    Diabetes Related Lab Review: Lab Results  Component Value Date   HGBA1C 10.0 (A) 08/14/2021   HGBA1C 7.7 (H) 02/08/2021   HGBA1C 6.6 (A) 11/09/2020    Lab Results  Component Value Date    MICROALBUR 1.0 10/14/2019   Lab Results  Component Value Date   CREATININE 0.61 02/08/2021   BUN 19 02/08/2021   NA 140 02/08/2021   K 4.1 02/08/2021   CL 103 02/08/2021   CO2 28 02/08/2021   Lab Results  Component Value Date   CHOL 121 02/08/2021   CHOL 126 01/28/2020   CHOL 212 (H) 10/14/2019   Lab Results  Component Value Date   HDL 29.70 (L) 02/08/2021   HDL 27.90 (L) 01/28/2020   HDL 30.00 (L) 10/14/2019   Lab Results  Component Value Date   LDLCALC 57 02/08/2021   LDLCALC 61 01/28/2020   Lab Results  Component Value Date   TRIG 169.0 (H) 02/08/2021   TRIG 187.0 (H) 01/28/2020   TRIG 265.0 (H) 10/14/2019   Lab Results  Component Value Date   CHOLHDL 4 02/08/2021   CHOLHDL 5 01/28/2020   CHOLHDL 7 10/14/2019   Lab Results  Component Value Date   LDLDIRECT 144.0 10/14/2019   The ASCVD Risk score (Arnett DK, et al., 2019) failed to calculate for the following reasons:   The valid total cholesterol range is 130 to 320 mg/dL  BP Readings from Last 3 Encounters:  08/14/21 (!) 146/80  04/21/21 (!) 162/80  04/11/21 (!) 168/154   Wt Readings from Last 3 Encounters:  08/14/21 165 lb 12.8 oz (75.2 kg)  04/21/21 169 lb 6.4 oz (76.8 kg)  04/11/21 171 lb (77.6 kg)    Health Maintenance  Topic Date Due  Zoster Vaccines- Shingrix (1 of 2) Never done   COLONOSCOPY (Pts 45-82yr Insurance coverage will need to be confirmed)  Never done   COVID-19 Vaccine (3 - Pfizer risk series) 10/07/2020   OPHTHALMOLOGY EXAM  05/17/2021   TETANUS/TDAP  12/15/2021 (Originally 09/17/1965)   DEXA SCAN  10/14/2021   FOOT EXAM  02/08/2022   HEMOGLOBIN A1C  02/11/2022   INFLUENZA VACCINE  Completed   Hepatitis C Screening  Completed   HPV VACCINES  Aged Out     Assessment  1. Uncontrolled type 2 diabetes mellitus with insulin therapy ([place]   2. History of bladder cancer   3. Hyperlipidemia associated with type 2 diabetes mellitus ([place]   4. Hypertension associated with  diabetes ([place]   5. Acquired hypothyroidism   6. Moderate persistent asthma without complication   7. Acute bacterial bronchitis   8. Need for immunization against influenza      Plan  Diabetes is very uncontrolled: recent illnesses, less activity in summer months due to heat and breathing problems, less medications and worsening diet. Discussed goals of care: increase ozempic to 148mweekly, cont met 1000bid. Increase lantus to 25 units nightly over 2 weeks, check sugars q am and 2 hours after lunch and dinner. Will reassess in 6 weeks. Unclear "intolerance" to sglt2-I. May be able to rechallenge. Schedule eye exam. Check labs. Refer to nutritionist HLD is controlled. Bp is reasonably well controlled. Stop hctz and start amlodipine due to percieved side effects.  Asthma and bronchitis: symbicort and add zpakd and tessalon perles.  Monitor thyroid. Stop meds in future if able.  Flu shot today.   I spent a total of 44 minutes for this patient encounter. Time spent included preparation, face-to-face counseling with the patient and coordination of care, review of chart and records, and documentation of the encounter.  Follow up:  6 weeks for recheck  Orders Placed This Encounter  Procedures   Flu Vaccine QUAD High Dose(Fluad)   CBC with Differential/Platelet   Comprehensive metabolic panel   TSH   Ambulatory referral to Nutrition and Diabetic Education   POCT HgB A1C    Meds ordered this encounter  Medications   Semaglutide, 1 MG/DOSE, 4 MG/3ML SOPN    Sig: Inject 1 mg as directed once a week.    Dispense:  9 mL    Refill:  3   insulin glargine (LANTUS SOLOSTAR) 100 UNIT/ML Solostar Pen    Sig: Inject 25 Units into the skin at bedtime for 7 days, THEN 30 Units at bedtime. Inject 15 units [place] daily at night.    Dispense:  15 mL    Refill:  1   azithromycin (ZITHROMAX) 250 MG tablet    Sig: Take 2 tabs today, then 1 tab daily for 4 days    Dispense:  1 each    Refill:  0    SYMBICORT 160-4.5 MCG/ACT inhaler    Sig: Inhale 2 puffs into the lungs 2 (two) times daily.    Dispense:  11 g    Refill:  11   amLODipine (NORVASC) 10 MG tablet    Sig: Take 1 tablet (10 mg total) by mouth daily.    Dispense:  90 tablet    Refill:  3   benzonatate (TESSALON) 200 MG capsule    Sig: Take 1 capsule (200 mg total) by mouth 2 (two) times daily as needed for cough.    Dispense:  20 capsule    Refill:  0  Depression screen [facility] 2/9 12/15/2020 11/09/2020 09/02/2020 08/10/2020 05/16/2020  Decreased Interest 0 0 0 0 0  Down, Depressed, Hopeless 1 0 0 0 0  PHQ - 2 Score 1 0 0 0 0  Altered sleeping - - - - -  Tired, decreased energy - - - - -  Change in appetite - - - - -  Feeling bad or failure about yourself  - - - - -  Trouble concentrating - - - - -  Moving slowly or fidgety/restless - - - - -  Suicidal thoughts - - - - -  PHQ-9 Score - - - - -    We updated and reviewed the patient's past history in detail and it is documented below.  Patient Active Problem List   Diagnosis Date Noted   History of bladder cancer 10/14/2019    Priority: High   Acquired hypothyroidism 05/06/2019    Priority: High   Type 2 diabetes mellitus with insulin therapy ([place]) 05/06/2019    Priority: High    Diagnosed in 1996    Hyperlipidemia associated with type 2 diabetes mellitus ([place]) 05/06/2019    Priority: High   Hypertension associated with diabetes ([place]) 05/06/2019    Priority: High   PTSD (post-traumatic stress disorder) 11/10/2018    Priority: High    Followed by dr. Clovis Pu. On xanax.     Osteopenia 05/05/2020    Priority: Medium    Dexa: 09/2018. Tscore of -1.7. repeat 3 years.     Gastroesophageal reflux disease 05/06/2019    Priority: Medium   Moderate persistent asthma without complication 52/84/1324    Priority: Medium   Chronic bilateral low back pain without sciatica 05/06/2019    Priority: Medium   Primary osteoarthritis of both hands 10/11/2015    Priority:  Medium   Morton's neuroma of both feet 08/10/2019    Priority: Low   Seasonal allergies 05/06/2019    Priority: Low   Bilateral carpal tunnel syndrome 10/11/2015    Priority: Low   Trigger finger of both hands 10/11/2015    Priority: [place] Maintenance  Topic Date Due   Zoster Vaccines- Shingrix (1 of 2) Never done   COLONOSCOPY (Pts 45-35yr Insurance coverage will need to be confirmed)  Never done   COVID-19 Vaccine (3 - Pfizer risk series) 10/07/2020   OPHTHALMOLOGY EXAM  05/17/2021   TETANUS/TDAP  12/15/2021 (Originally 09/17/1965)   DEXA SCAN  10/14/2021   FOOT EXAM  02/08/2022   HEMOGLOBIN A1C  02/11/2022   INFLUENZA VACCINE  Completed   Hepatitis C Screening  Completed   HPV VACCINES  Aged Out   Immunization History  Administered Date(s) Administered   Fluad Quad(high Dose 65+) 08/12/2019, 08/10/2020, 08/14/2021   PFIZER(Purple Top)SARS-COV-2 Vaccination 08/17/2020, 09/09/2020   Pneumococcal Conjugate-13 07/09/2019   Pneumococcal Polysaccharide-23 08/10/2020   Current Meds  Medication Sig   Accu-Chek Softclix Lancets lancets    ALPRAZolam (XANAX) 1 MG tablet 1/2 tablet TID and 1 at night   amLODipine (NORVASC) 10 MG tablet Take 1 tablet (10 mg total) by mouth daily.   azelastine (ASTELIN) 0.1 % nasal spray Place 1 spray into both nostrils in the morning. Use in each nostril as directed   azithromycin (ZITHROMAX) 250 MG tablet Take 2 tabs today, then 1 tab daily for 4 days   benzonatate (TESSALON) 200 MG capsule Take 1 capsule (200 mg total) by mouth 2 (two) times daily as needed for cough.   blood glucose meter kit and  supplies Dispense based on patient and insurance preference. Use up to four times daily as directed. (FOR ICD-10 E10.9, E11.9).   cholecalciferol (VITAMIN D3) 25 MCG (1000 UNIT) tablet Take 1,000 Units by mouth daily.   escitalopram (LEXAPRO) 10 MG tablet Take 1 tablet (10 mg total) by mouth at bedtime.   fenofibrate 160 MG tablet Take 1 tablet  (160 mg total) by mouth daily.   fluticasone (FLONASE) 50 MCG/ACT nasal spray Place 2 sprays into both nostrils daily.   glucose blood test strip Use as instructed   Insulin Pen Needle (PEN NEEDLES) 32G X 5 MM MISC Please use new needle after very Lantus injection.   levothyroxine (SYNTHROID) 25 MCG tablet Take one pill daily for thyroid   losartan (COZAAR) 100 MG tablet Take 1 tablet (100 mg total) by mouth daily.   magnesium oxide (MAG-OX) 400 MG tablet Take 400 mg by mouth 2 (two) times daily.   metFORMIN (GLUCOPHAGE) 1000 MG tablet Take 1 tablet (1,000 mg total) by mouth 2 (two) times daily with a meal.   montelukast (SINGULAIR) 10 MG tablet Take 1 tablet (10 mg total) by mouth at bedtime.   Multiple Vitamin (MULTIVITAMIN WITH MINERALS) TABS tablet Take 1 tablet by mouth daily.   Omega 3 1000 MG CAPS Take 1,000 mg by mouth daily.   omeprazole (PRILOSEC) 20 MG capsule Take 1 capsule by mouth once daily   Polyethylene Glycol 3350 (MIRALAX PO) Take by mouth every evening.   rosuvastatin (CRESTOR) 10 MG tablet Take 1 tablet (10 mg total) by mouth daily.   Semaglutide, 1 MG/DOSE, 4 MG/3ML SOPN Inject 1 mg as directed once a week.   VITAMIN E PO Take by mouth.   [DISCONTINUED] hydrochlorothiazide (HYDRODIURIL) 25 MG tablet Take 1 tablet (25 mg total) by mouth daily.   [DISCONTINUED] insulin glargine (LANTUS SOLOSTAR) 100 UNIT/ML Solostar Pen Inject 15 units [place] daily at night   [DISCONTINUED] Semaglutide,0.25 or 0.5MG/DOS, (OZEMPIC, 0.25 OR 0.5 MG/DOSE,) 2 MG/1.5ML SOPN Inject 0.5 mg into the skin once a week.   [DISCONTINUED] SYMBICORT 160-4.5 MCG/ACT inhaler Inhale 2 puffs by mouth twice daily    Allergies: Patient is allergic to celebrex [celecoxib], glipizide, penicillins, and sulfa antibiotics. Past Medical History Patient  has a past medical history of Bladder cancer ([place]) (first dx 2012), Chronic constipation, Closed fracture of left distal radius (08/30/2016), Full dentures, GERD  (gastroesophageal reflux disease), Hyperlipidemia, Hypertension, Mild intermittent asthma, OA (osteoarthritis), Trigger finger of both hands, and Type 2 diabetes mellitus ([place]). Past Surgical History Patient  has a past surgical history that includes Bladder surgery (2012   in [facility]); Cholecystectomy (1970); Transurethral resection of bladder tumor (N/A, 05/24/2014); Cystoscopy/retrograde/ureteroscopy (Left, 05/24/2014); and Transurethral resection of bladder tumor (N/A, 08/04/2015). Family History: Patient family history includes AAA (abdominal aortic aneurysm) in her sister; Cancer in her brother and father; Diabetes in her brother; Early death in her mother. Social History:  Patient  reports that she quit smoking about 27 years ago. Her smoking use included cigarettes. She has never used smokeless tobacco. She reports current alcohol use. She reports that she does not use drugs.  Review of Systems: Constitutional: negative for fever or malaise Ophthalmic: negative for photophobia, double vision or loss of vision Cardiovascular: negative for chest pain, dyspnea on exertion, or new LE swelling Respiratory: negative for SOB or persistent cough Gastrointestinal: negative for abdominal pain, change in bowel habits or melena Genitourinary: negative for dysuria or gross hematuria Musculoskeletal: negative for new gait disturbance or  muscular weakness Integumentary: negative for new or persistent rashes Neurological: negative for TIA or stroke symptoms Psychiatric: negative for SI or delusions Allergic/Immunologic: negative for hives  Patient Care Team    Relationship Specialty Notifications Start End  Leamon Arnt, MD PCP - General Family Medicine  08/14/21   Alda Berthold, [place] Physician Neurology  06/26/19   Lonzo Candy, AUD  Audiology  03/14/20   Purnell Shoemaker., MD Attending Physician Psychiatry  08/14/21   Franchot Gallo, MD Consulting Physician Urology  08/14/21      Objective  Vitals: BP (!) 146/80   Pulse 97   Temp (!) 97.4 F (36.3 C) (Temporal)   Ht 5' 3" (1.6 m)   Wt 165 lb 12.8 oz (75.2 kg)   LMP  (LMP Unknown)   SpO2 95%   BMI 29.37 kg/m  General:  Well developed, well nourished, no acute distress  Psych:  Alert and oriented,normal mood and affect Cardiovascular:  RRR without gallop, rub or murmur Respiratory:  Good breath sounds bilaterally, CTAB with normal respiratory effort   Commons side effects, risks, benefits, and alternatives for medications and treatment plan prescribed today were discussed, and the patient expressed understanding of the given instructions. Patient is instructed to call or message via MyChart if he/she has any questions or concerns regarding our treatment plan. No barriers to understanding were identified. We discussed Red Flag symptoms and signs in detail. Patient expressed understanding regarding what to do in case of urgent or emergency type symptoms.  Medication list was reconciled, printed and provided to the patient in AVS. Patient instructions and summary information was reviewed with the patient as documented in the AVS. This note was prepared with assistance of Dragon voice recognition software. Occasional wrong-word or sound-a-like substitutions may have occurred due to the inherent limitations of voice recognition software  This visit occurred during the SARS-CoV-2 public health emergency.  Safety protocols were in place, including screening questions prior to the visit, additional usage of staff PPE, and extensive cleaning of exam room while observing appropriate contact time as indicated for disinfecting solutions.

## 2021-08-14 NOTE — Patient Instructions (Signed)
Please return in 6 weeks to recheck diabetes and blood pressure.   I will release your lab results to you on your MyChart account with further instructions. Please reply with any questions.    We will send you for nutrition counseling.  Today you were given your flu shot.  Please call Dr. Payton Emerald office to schedule a diabetic eye exam.  [phone]  You may stop your hctz. I have added a new blood pressure pill amlodipine to take along with your cozaar (losartan).  We have lots to work on! It was a pleasure meeting you!  If you have any questions or concerns, please don't hesitate to send me a message via MyChart or call the office at [phone]. Thank you for visiting with [place] today! It's our pleasure caring for you.

## 2021-08-15 ENCOUNTER — Other Ambulatory Visit: Payer: Self-pay

## 2021-08-15 ENCOUNTER — Other Ambulatory Visit (INDEPENDENT_AMBULATORY_CARE_PROVIDER_SITE_OTHER): Payer: Medicare Other

## 2021-08-15 DIAGNOSIS — D509 Iron deficiency anemia, unspecified: Secondary | ICD-10-CM | POA: Diagnosis not present

## 2021-08-15 LAB — IBC + FERRITIN
Ferritin: 33.4 ng/mL (ref 10.0–291.0)
Iron: 68 ug/dL (ref 42–145)
Saturation Ratios: 14 % — ABNORMAL LOW (ref 20.0–50.0)
TIBC: 485.8 ug/dL — ABNORMAL HIGH (ref 250.0–450.0)
Transferrin: 347 mg/dL (ref 212.0–360.0)

## 2021-08-15 LAB — COMPREHENSIVE METABOLIC PANEL
ALT: 29 U/L (ref 0–35)
AST: 62 U/L — ABNORMAL HIGH (ref 0–37)
Albumin: 4.4 g/dL (ref 3.5–5.2)
Alkaline Phosphatase: 56 U/L (ref 39–117)
BUN: 16 mg/dL (ref 6–23)
CO2: 26 mEq/L (ref 19–32)
Calcium: 10.5 mg/dL (ref 8.4–10.5)
Chloride: 98 mEq/L (ref 96–112)
Creatinine, Ser: 0.93 mg/dL (ref 0.40–1.20)
GFR: 59.95 mL/min — ABNORMAL LOW (ref >60.00)
Glucose, Bld: 252 mg/dL — ABNORMAL HIGH (ref 70–99)
Potassium: 3.6 mEq/L (ref 3.5–5.1)
Sodium: 136 mEq/L (ref 135–145)
Total Bilirubin: 0.4 mg/dL (ref 0.2–1.2)
Total Protein: 8 g/dL (ref 6.0–8.3)

## 2021-08-15 LAB — TSH: TSH: 4.85 u[IU]/mL (ref 0.35–5.50)

## 2021-08-15 NOTE — Addendum Note (Signed)
Addended by: Loura Back on: 08/15/2021 03:04 PM   Modules accepted: Orders

## 2021-08-16 ENCOUNTER — Other Ambulatory Visit (INDEPENDENT_AMBULATORY_CARE_PROVIDER_SITE_OTHER): Payer: Medicare Other

## 2021-08-16 ENCOUNTER — Other Ambulatory Visit: Payer: Self-pay

## 2021-08-16 DIAGNOSIS — D509 Iron deficiency anemia, unspecified: Secondary | ICD-10-CM | POA: Diagnosis not present

## 2021-08-16 NOTE — Addendum Note (Signed)
Addended by: Doran Clay A on: 08/16/2021 02:00 PM   Modules accepted: Orders

## 2021-08-17 LAB — IRON,TIBC AND FERRITIN PANEL
%SAT: 11 % (calc) — ABNORMAL LOW (ref 16–45)
Ferritin: 20 ng/mL (ref 16–288)
Iron: 46 ug/dL (ref 45–160)
TIBC: 426 mcg/dL (calc) (ref 250–450)

## 2021-08-22 ENCOUNTER — Encounter: Payer: Medicare Other | Admitting: Nutrition

## 2021-08-22 ENCOUNTER — Encounter: Payer: Self-pay | Admitting: Family Medicine

## 2021-08-23 ENCOUNTER — Telehealth: Payer: Self-pay | Admitting: Nutrition

## 2021-08-23 NOTE — Telephone Encounter (Signed)
Tried contacting patient to see if we can reschedule, but not able to leave a voice mail on her phone.

## 2021-08-25 ENCOUNTER — Encounter: Payer: Medicare Other | Admitting: Family Medicine

## 2021-08-30 ENCOUNTER — Ambulatory Visit: Payer: Medicare Other | Admitting: Nutrition

## 2021-09-05 ENCOUNTER — Other Ambulatory Visit: Payer: Self-pay | Admitting: Family Medicine

## 2021-09-06 ENCOUNTER — Ambulatory Visit: Payer: Medicare Other | Admitting: Nutrition

## 2021-09-06 ENCOUNTER — Encounter: Payer: Medicare Other | Admitting: Family Medicine

## 2021-09-18 DIAGNOSIS — Z20822 Contact with and (suspected) exposure to covid-19: Secondary | ICD-10-CM | POA: Diagnosis not present

## 2021-09-19 DIAGNOSIS — Z20822 Contact with and (suspected) exposure to covid-19: Secondary | ICD-10-CM | POA: Diagnosis not present

## 2021-09-20 DIAGNOSIS — Z20822 Contact with and (suspected) exposure to covid-19: Secondary | ICD-10-CM | POA: Diagnosis not present

## 2021-09-24 ENCOUNTER — Other Ambulatory Visit: Payer: Self-pay | Admitting: Family Medicine

## 2021-09-24 DIAGNOSIS — K219 Gastro-esophageal reflux disease without esophagitis: Secondary | ICD-10-CM

## 2021-09-26 ENCOUNTER — Encounter: Payer: Self-pay | Admitting: Family Medicine

## 2021-09-26 ENCOUNTER — Other Ambulatory Visit: Payer: Self-pay

## 2021-09-26 ENCOUNTER — Ambulatory Visit (INDEPENDENT_AMBULATORY_CARE_PROVIDER_SITE_OTHER): Payer: Medicare Other | Admitting: Family Medicine

## 2021-09-26 VITALS — BP 142/72 | HR 87 | Temp 97.6°F | Wt 165.8 lb

## 2021-09-26 DIAGNOSIS — I152 Hypertension secondary to endocrine disorders: Secondary | ICD-10-CM

## 2021-09-26 DIAGNOSIS — E1159 Type 2 diabetes mellitus with other circulatory complications: Secondary | ICD-10-CM

## 2021-09-26 DIAGNOSIS — Z794 Long term (current) use of insulin: Secondary | ICD-10-CM

## 2021-09-26 DIAGNOSIS — Z20822 Contact with and (suspected) exposure to covid-19: Secondary | ICD-10-CM | POA: Diagnosis not present

## 2021-09-26 DIAGNOSIS — E119 Type 2 diabetes mellitus without complications: Secondary | ICD-10-CM

## 2021-09-26 LAB — POCT GLYCOSYLATED HEMOGLOBIN (HGB A1C): Hemoglobin A1C: 7.5 % — AB (ref 4.0–5.6)

## 2021-09-26 MED ORDER — LANTUS SOLOSTAR 100 UNIT/ML ~~LOC~~ SOPN: 32.0000 [IU] | PEN_INJECTOR | Freq: Every day | SUBCUTANEOUS | 1 refills | Status: DC

## 2021-09-26 MED ORDER — METOPROLOL SUCCINATE ER 25 MG PO TB24: 25.0000 mg | ORAL_TABLET | Freq: Every day | ORAL | 3 refills | Status: DC

## 2021-09-26 MED ORDER — BENZONATATE 200 MG PO CAPS: 200.0000 mg | ORAL_CAPSULE | Freq: Two times a day (BID) | ORAL | 0 refills | Status: DC | PRN

## 2021-09-26 NOTE — Progress Notes (Signed)
Subjective  CC:  Chief Complaint  Patient presents with   Follow-up    HPI: Teresa Rice is a 75 y.o. female who presents to the office today for follow up of diabetes and problems listed above in the chief complaint.  Diabetes follow up: short term follow up for uncontrolled diabetes after medication adjustment. See last note. Fasting sugars are much improved: lantus titrated up to 23 units nightly. Fastings are around 120s-150 now. She denies exertional CP or SOB or symptomatic hypoglycemia. She denies foot sores or paresthesias.  HTN: bps improved. Bp log shows improving bp: still running borderline high. Tolerating losartan and amlodipine. (Had rash with hctz)  Wt Readings from Last 3 Encounters:  09/26/21 165 lb 12.8 oz (75.2 kg)  08/14/21 165 lb 12.8 oz (75.2 kg)  04/21/21 169 lb 6.4 oz (76.8 kg)    BP Readings from Last 3 Encounters:  09/26/21 (!) 142/72  08/14/21 (!) 146/80  04/21/21 (!) 162/80    Assessment  1. Type 2 diabetes mellitus with insulin therapy ([place])   2. Hypertension associated with diabetes ([place])       Plan  Diabetes is currently well controlled. Much improved after a 6 weeks of med changes and diet improvement. Will check again in 6 weeks. No med changes today: continue lantus ozempic and metformin HTN: nearly controlled. Add bb: toprol xl 25 daily to amlodipine 10 and losartan 100. Recheck 6 weeks.   Follow up: 6 weeks recheck daibetes and bp. Orders Placed This Encounter  Procedures   POCT HgB A1C    Meds ordered this encounter  Medications   benzonatate (TESSALON) 200 MG capsule    Sig: Take 1 capsule (200 mg total) by mouth 2 (two) times daily as needed for cough.    Dispense:  20 capsule    Refill:  0   metoprolol succinate (TOPROL-XL) 25 MG 24 hr tablet    Sig: Take 1 tablet (25 mg total) by mouth daily.    Dispense:  90 tablet    Refill:  3   insulin glargine (LANTUS SOLOSTAR) 100 UNIT/ML Solostar Pen    Sig: Inject 32 Units into  the skin at bedtime.    Dispense:  15 mL    Refill:  1       Immunization History  Administered Date(s) Administered   Fluad Quad(high Dose 65+) 08/12/2019, 08/10/2020, 08/14/2021   PFIZER(Purple Top)SARS-COV-2 Vaccination 08/17/2020, 09/09/2020   Pneumococcal Conjugate-13 07/09/2019   Pneumococcal Polysaccharide-23 08/10/2020    Diabetes Related Lab Review: Lab Results  Component Value Date   HGBA1C 7.5 (A) 09/26/2021   HGBA1C 10.0 (A) 08/14/2021   HGBA1C 7.7 (H) 02/08/2021    Lab Results  Component Value Date   MICROALBUR 1.0 10/14/2019   Lab Results  Component Value Date   CREATININE 0.93 08/14/2021   BUN 16 08/14/2021   NA 136 08/14/2021   K 3.6 08/14/2021   CL 98 08/14/2021   CO2 26 08/14/2021   Lab Results  Component Value Date   CHOL 121 02/08/2021   CHOL 126 01/28/2020   CHOL 212 (H) 10/14/2019   Lab Results  Component Value Date   HDL 29.70 (L) 02/08/2021   HDL 27.90 (L) 01/28/2020   HDL 30.00 (L) 10/14/2019   Lab Results  Component Value Date   LDLCALC 57 02/08/2021   LDLCALC 61 01/28/2020   Lab Results  Component Value Date   TRIG 169.0 (H) 02/08/2021   TRIG 187.0 (H) 01/28/2020  TRIG 265.0 (H) 10/14/2019   Lab Results  Component Value Date   CHOLHDL 4 02/08/2021   CHOLHDL 5 01/28/2020   CHOLHDL 7 10/14/2019   Lab Results  Component Value Date   LDLDIRECT 144.0 10/14/2019   The ASCVD Risk score (Arnett DK, et al., 2019) failed to calculate for the following reasons:   The valid total cholesterol range is 130 to 320 mg/dL I have reviewed the PMH, Fam and Soc history. Patient Active Problem List   Diagnosis Date Noted   History of bladder cancer 10/14/2019    Priority: High   Acquired hypothyroidism 05/06/2019    Priority: High   Type 2 diabetes mellitus with insulin therapy ([place]) 05/06/2019    Priority: High    Diagnosed in 1996    Hyperlipidemia associated with type 2 diabetes mellitus ([place]) 05/06/2019    Priority: High    Hypertension associated with diabetes ([place]) 05/06/2019    Priority: High   PTSD (post-traumatic stress disorder) 11/10/2018    Priority: High    Followed by dr. Clovis Pu. On xanax.     Osteopenia 05/05/2020    Priority: Medium     Dexa: 09/2018. Tscore of -1.7. repeat 3 years.     Gastroesophageal reflux disease 05/06/2019    Priority: Medium    Moderate persistent asthma without complication 94/76/5465    Priority: Medium    Chronic bilateral low back pain without sciatica 05/06/2019    Priority: Medium    Primary osteoarthritis of both hands 10/11/2015    Priority: Medium    Morton's neuroma of both feet 08/10/2019    Priority: Low   Seasonal allergies 05/06/2019    Priority: Low   Bilateral carpal tunnel syndrome 10/11/2015    Priority: Low   Trigger finger of both hands 10/11/2015    Priority: Low    Social History: Patient  reports that she quit smoking about 27 years ago. Her smoking use included cigarettes. She has never used smokeless tobacco. She reports current alcohol use. She reports that she does not use drugs.  Review of Systems: Ophthalmic: negative for eye pain, loss of vision or double vision Cardiovascular: negative for chest pain Respiratory: negative for SOB or persistent cough Gastrointestinal: negative for abdominal pain Genitourinary: negative for dysuria or gross hematuria MSK: negative for foot lesions Neurologic: negative for weakness or gait disturbance  Objective  Vitals: BP (!) 142/72   Pulse 87   Temp 97.6 F (36.4 C) (Temporal)   Wt 165 lb 12.8 oz (75.2 kg)   LMP  (LMP Unknown)   SpO2 98%   BMI 29.37 kg/m  General: well appearing, no acute distress  Psych:  Alert and oriented, normal mood and affect HEENT:  Normocephalic, atraumatic, moist mucous membranes, supple neck  Cardiovascular:  Nl S1 and S2, RRR without murmur, gallop or rub. no edema Respiratory:  Good breath sounds bilaterally, CTAB with normal effort, no  rales    Diabetic education: ongoing education regarding chronic disease management for diabetes was given today. We continue to reinforce the ABC's of diabetic management: A1c (<7 or 8 dependent upon patient), tight blood pressure control, and cholesterol management with goal LDL < 100 minimally. We discuss diet strategies, exercise recommendations, medication options and possible side effects. At each visit, we review recommended immunizations and preventive care recommendations for diabetics and stress that good diabetic control can prevent other problems. See below for this patient's data.   Commons side effects, risks, benefits, and alternatives for medications and treatment  plan prescribed today were discussed, and the patient expressed understanding of the given instructions. Patient is instructed to call or message via MyChart if he/she has any questions or concerns regarding our treatment plan. No barriers to understanding were identified. We discussed Red Flag symptoms and signs in detail. Patient expressed understanding regarding what to do in case of urgent or emergency type symptoms.  Medication list was reconciled, printed and provided to the patient in AVS. Patient instructions and summary information was reviewed with the patient as documented in the AVS. This note was prepared with assistance of Dragon voice recognition software. Occasional wrong-word or sound-a-like substitutions may have occurred due to the inherent limitations of voice recognition software  This visit occurred during the SARS-CoV-2 public health emergency.  Safety protocols were in place, including screening questions prior to the visit, additional usage of staff PPE, and extensive cleaning of exam room while observing appropriate contact time as indicated for disinfecting solutions.

## 2021-09-26 NOTE — Patient Instructions (Signed)
Please return in 6 weeks to recheck blood pressure and diabetes.   Your diabetes is looking much better! Keep taking your medications as prescribed and keep up the good work with diet.   I have added a new blood pressure pill: Toprol XL 26md daily. Please take in addition to the amlodipine and losartan.  I've refilled your tessalon perles for your cough.   Please set up an appointment for a diabetic eye exam and have the results sent to me.   If you have any questions or concerns, please don't hesitate to send me a message via MyChart or call the office at [phone]. Thank you for visiting with [place] today! It's our pleasure caring for you.

## 2021-09-27 DIAGNOSIS — Z20822 Contact with and (suspected) exposure to covid-19: Secondary | ICD-10-CM | POA: Diagnosis not present

## 2021-09-28 DIAGNOSIS — Z20822 Contact with and (suspected) exposure to covid-19: Secondary | ICD-10-CM | POA: Diagnosis not present

## 2021-10-11 ENCOUNTER — Other Ambulatory Visit: Payer: Self-pay

## 2021-10-11 ENCOUNTER — Encounter: Payer: Self-pay | Admitting: Psychiatry

## 2021-10-11 ENCOUNTER — Ambulatory Visit (INDEPENDENT_AMBULATORY_CARE_PROVIDER_SITE_OTHER): Payer: Medicare Other | Admitting: Psychiatry

## 2021-10-11 DIAGNOSIS — F4310 Post-traumatic stress disorder, unspecified: Secondary | ICD-10-CM

## 2021-10-11 DIAGNOSIS — F331 Major depressive disorder, recurrent, moderate: Secondary | ICD-10-CM | POA: Diagnosis not present

## 2021-10-11 DIAGNOSIS — F4001 Agoraphobia with panic disorder: Secondary | ICD-10-CM

## 2021-10-11 DIAGNOSIS — F5105 Insomnia due to other mental disorder: Secondary | ICD-10-CM | POA: Diagnosis not present

## 2021-10-11 DIAGNOSIS — G4721 Circadian rhythm sleep disorder, delayed sleep phase type: Secondary | ICD-10-CM

## 2021-10-11 MED ORDER — ALPRAZOLAM 1 MG PO TABS: ORAL_TABLET | ORAL | 5 refills | Status: DC

## 2021-10-11 MED ORDER — ESCITALOPRAM OXALATE 10 MG PO TABS: 10.0000 mg | ORAL_TABLET | Freq: Every day | ORAL | 1 refills | Status: DC

## 2021-10-11 NOTE — Progress Notes (Signed)
Teresa Rice [id number] 09/09/1946 75 y.o.  Subjective:   Patient ID:  Teresa Rice is a 75 y.o. (DOB 21-Jul-1946) female.  Chief Complaint:  Chief Complaint  Patient presents with   Follow-up    Panic disorder with agoraphobia   Depression   Post-Traumatic Stress Disorder   Anxiety    HPI Teresa Rice presents to the office today for follow-up of anxiety.    seen Dec 2020 without med changes though she had previously stopped SSRI AMA.  MD had concerns over LT panic,  05/25/20 appt with the following noted: No vaccine. Teresa Rice out of bed.  Hurt herself.  Needs eye surgery and worry about it and DM.  Fights off depression and anxiety.  Feels stressed.   No caffeine.  Not aware if something bothering her.  No full panic.  Not drowsy daytime. .  Also falling asleep on the couch though she doesn't want to do it.  Chest gets tight with anxiety. No weight loss off Paxil but no weight gain either.  Overall is not worse still and pleased with that. 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 Still doing well with the Xanax. Usually 1/2 mg TID and 1 mg HS.  Plan no med changes  11/24/20 appt with following noted: Some issues with D about her past and a son taken from her at 88 yo.  Recent conflict, 2 days before Christmas,  through me for a loop and said things she regrets to her D out of anger.   The 24 yo son will not listen to her or let her tell her side of the story.  Very confusing to me.  It is a hard thing to cope with..  She tried to get court records about it but was told it was a closed case. Good news November 14 was baptized.  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  [place].  Will be another great grandmother will be a little boy.   Due 03/17/20.   Can awaken with chest pressure in the morning but it stops short of full panic.  Did not have it this morning.    Plan no med changes  01/18/2021 phone call  complaining of very depression, staying in bed until 11 AM not wanting to drive, and cannot shut her mind off and asking about an antidepressant.  She had previously been on paroxetine but wanted to stop it.  Therefore we started duloxetine 30 mg 1 daily for 1 week and then 2 daily  03/08/2021 appointment with the following noted: Never started duloxetine bc read about SE of tremor and never took it. The got RX for paroxetine to restart and Had to stop it bc shaking and felt sick going into 3rd week on it.  Tremor too bad. Depression triggered by conversation between estranged son (stolen from me)  and her daughter.  Really upset her.   Now some days more depressed and some days she feels and functions better.  This problem sticks in her head and bothers her.  Thinks son said he hopes she dies.  Haven't talked to son in 43 years. More bad days than good.   Plan rec low dose 10 Lexapro  05/08/2021 appt noted: Taken Lexapro.  Calmer and don't feel upset.  I feel good.  No SE with the med.  Took a little while to help.  Doesn't talk about son.  That was the root of my problem.  Patient reports stable mood and denies depressed or irritable moods.  Patient denies any recent difficulty with anxiety.  Patient denies difficulty with sleep initiation or maintenance. Denies appetite disturbance.  Patient reports that energy and motivation have been good.  Patient denies any difficulty with concentration.  Patient denies any suicidal ideation. Loves 2 great grand-sons [name] 2, [name] 9 weeks. Got Covid since here. Cough resolved but still a little tired.  10/11/21 appt noted: Stopped Lexapro bc just wanted to see if I'd be OK and was more irritable.  Then restarted it last month. Sleep a long time.  She is fearful about cuitting it back  Doesn't feels she can do without Xanax or reduce it.  Not markely depressed. Tolerating meds without SE.  No SI  New PCP Joya Gaskins, [place]  Past Psychiatric Medication  Trials:  Paxil 60, sertraline forgetfulness, Lexapro 10  trazodone, She has been under our care since 1998 and has been on the same dosage of Xanax the whole time and most of the time took paroxetine 60  Review of Systems:  Review of Systems  HENT:  Positive for hearing loss and voice change.   Respiratory:  Positive for shortness of breath.   Cardiovascular:  Negative for palpitations.  Musculoskeletal:  Positive for back pain.  Neurological:  Positive for tremors. Negative for weakness.  Psychiatric/Behavioral:  Negative for agitation, behavioral problems, confusion, decreased concentration, dysphoric mood, hallucinations, self-injury, sleep disturbance and suicidal ideas. The patient is nervous/anxious. The patient is not hyperactive.    Medications: I have reviewed the patient's current medications.  Current Outpatient Medications  Medication Sig Dispense Refill   Accu-Chek Softclix Lancets lancets      ALPRAZolam (XANAX) 1 MG tablet 1/2 tablet TID and 1 at night 75 tablet 5   amLODipine (NORVASC) 10 MG tablet Take 1 tablet (10 mg total) by mouth daily. 90 tablet 3   azelastine (ASTELIN) 0.1 % nasal spray Place 1 spray into both nostrils in the morning. Use in each nostril as directed 30 mL 2   B-D UF III MINI PEN NEEDLES 31G X 5 MM MISC USE AS DIRECTED WITH  LANTUS  INJECTION 100 each 0   benzonatate (TESSALON) 200 MG capsule Take 1 capsule (200 mg total) by mouth 2 (two) times daily as needed for cough. 20 capsule 0   blood glucose meter kit and supplies Dispense based on patient and insurance preference. Use up to four times daily as directed. (FOR ICD-10 E10.9, E11.9). 1 each 0   cholecalciferol (VITAMIN D3) 25 MCG (1000 UNIT) tablet Take 1,000 Units by mouth daily.     escitalopram (LEXAPRO) 10 MG tablet Take 1 tablet (10 mg total) by mouth at bedtime. 90 tablet 1   fenofibrate 160 MG tablet Take 1 tablet (160 mg total) by mouth daily. 90 tablet 3   fluticasone (FLONASE) 50  MCG/ACT nasal spray Place 2 sprays into both nostrils daily. 16 g 6   glucose blood test strip Use as instructed 100 each 12   insulin glargine (LANTUS SOLOSTAR) 100 UNIT/ML Solostar Pen Inject 32 Units into the skin at bedtime. 15 mL 1   Insulin Pen Needle (PEN NEEDLES) 32G X 5 MM MISC Please use new needle after very Lantus injection. 100 each 3   levothyroxine (SYNTHROID) 25 MCG tablet Take one pill daily for thyroid 90 tablet 3   losartan (COZAAR) 100 MG tablet Take 1 tablet (100 mg total) by mouth daily. 90 tablet 3   magnesium  oxide (MAG-OX) 400 MG tablet Take 400 mg by mouth 2 (two) times daily.     metFORMIN (GLUCOPHAGE) 1000 MG tablet Take 1 tablet (1,000 mg total) by mouth 2 (two) times daily with a meal. 180 tablet 3   metoprolol succinate (TOPROL-XL) 25 MG 24 hr tablet Take 1 tablet (25 mg total) by mouth daily. 90 tablet 3   Multiple Vitamin (MULTIVITAMIN WITH MINERALS) TABS tablet Take 1 tablet by mouth daily.     Omega 3 1000 MG CAPS Take 1,000 mg by mouth daily.     omeprazole (PRILOSEC) 20 MG capsule Take 1 capsule by mouth once daily 90 capsule 0   Polyethylene Glycol 3350 (MIRALAX PO) Take by mouth every evening.     rosuvastatin (CRESTOR) 10 MG tablet Take 1 tablet (10 mg total) by mouth daily. 90 tablet 3   Semaglutide, 1 MG/DOSE, 4 MG/3ML SOPN Inject 1 mg as directed once a week. 9 mL 3   SYMBICORT 160-4.5 MCG/ACT inhaler Inhale 2 puffs into the lungs 2 (two) times daily. 11 g 11   VITAMIN E PO Take by mouth.     montelukast (SINGULAIR) 10 MG tablet Take 1 tablet (10 mg total) by mouth at bedtime. (Patient not taking: Reported on 10/11/2021) 90 tablet 1   No current facility-administered medications for this visit.    Medication Side Effects: None  Allergies:  Allergies  Allergen Reactions   Celebrex [Celecoxib] Anaphylaxis   Glipizide Itching   Penicillins Other (See Comments)    "Burn marks from inside out"    Sulfa Antibiotics Itching    Past Medical  History:  Diagnosis Date   Bladder cancer ([employer]) first dx 2012   Recurrent Bladder Cancer--  (urologist-  dr Diona Fanti)   Chronic constipation    Closed fracture of left distal radius 08/30/2016   Full dentures    GERD (gastroesophageal reflux disease)    Hyperlipidemia    Hypertension    Mild intermittent asthma    OA (osteoarthritis)    fingers    Trigger finger of both hands    wear slints and special gloves at night   Type 2 diabetes mellitus ([place])     Family History  Problem Relation Age of Onset   Early death Mother    AAA (abdominal aortic aneurysm) Sister    Cancer Father    Diabetes Brother    Cancer Brother     Social History   Socioeconomic History   Marital status: Widowed    Spouse name: Not on file   Number of children: 1   Years of education: 44   Highest education level: Not on file  Occupational History   Occupation: retired  Tobacco Use   Smoking status: Former    Years: 30.00    Types: Cigarettes    Quit date: 05/21/1994    Years since quitting: 27.4   Smokeless tobacco: Never  Vaping Use   Vaping Use: Never used  Substance and Sexual Activity   Alcohol use: Yes    Comment: RARE   Drug use: No   Sexual activity: Not on file  Other Topics Concern   Not on file  Social History Narrative   Lives in an apartment, lives alone   One daughter   Right handed    Retired from [employer] work   Highest level of education:  GED   Social Determinants of Radio broadcast assistant Strain: Low Risk    Difficulty of Paying Living Expenses: Not  hard at all  Food Insecurity: No Food Insecurity   Worried About Charity fundraiser in the Last Year: Never true   Ran Out of Food in the Last Year: Never true  Transportation Needs: No Transportation Needs   Lack of Transportation (Medical): No   Lack of Transportation (Non-Medical): No  Physical Activity: Inactive   Days of Exercise per Week: 0 days   Minutes of Exercise per Session: 0 min  Stress: No  Stress Concern Present   Feeling of Stress : Only a little  Social Connections: Moderately Isolated   Frequency of Communication with Friends and Family: More than three times a week   Frequency of Social Gatherings with Friends and Family: Never   Attends Religious Services: More than 4 times per year   Active Member of [employer] or Organizations: No   Attends Archivist Meetings: Never   Marital Status: Widowed  Human resources officer Violence: Not At Risk   Fear of Current or Ex-Partner: No   Emotionally Abused: No   Physically Abused: No   Sexually Abused: No    Past Medical History, Surgical history, Social history, and Family history were reviewed and updated as appropriate.   2 new hearing aids.  Please see review of systems for further details on the patient's review from today.   Objective:   Physical Exam:  LMP  (LMP Unknown)   Physical Exam Constitutional:      General: She is not in acute distress. Musculoskeletal:        General: No deformity.  Neurological:     Mental Status: She is alert and oriented to person, place, and time.     Coordination: Coordination normal.     Gait: Gait normal.  Psychiatric:        Attention and Perception: Attention normal. She does not perceive auditory hallucinations.        Mood and Affect: Mood is anxious. Mood is not depressed. Affect is not labile, blunt, angry, tearful or inappropriate.        Speech: Speech normal.        Behavior: Behavior normal.        Thought Content: Thought content normal. Thought content does not include homicidal or suicidal ideation. Thought content does not include homicidal or suicidal plan.        Cognition and Memory: Cognition normal.        Judgment: Judgment normal.     Comments: Insight fair.   No auditory or visual hallucinations. No delusions.     Lab Review:     Component Value Date/Time   NA 136 08/14/2021 1352   K 3.6 08/14/2021 1352   CL 98 08/14/2021 1352   CO2 26  08/14/2021 1352   GLUCOSE 252 (H) 08/14/2021 1352   BUN 16 08/14/2021 1352   CREATININE 0.93 08/14/2021 1352   CALCIUM 10.5 08/14/2021 1352   PROT 8.0 08/14/2021 1352   ALBUMIN 4.4 08/14/2021 1352   AST 62 (H) 08/14/2021 1352   ALT 29 08/14/2021 1352   ALKPHOS 56 08/14/2021 1352   BILITOT 0.4 08/14/2021 1352   GFRNONAA 87 (L) 05/24/2014 0620   GFRAA >90 05/24/2014 0620       Component Value Date/Time   WBC 8.2 08/14/2021 1352   RBC 4.53 08/14/2021 1352   HGB 11.4 (L) 08/14/2021 1352   HCT 34.4 (L) 08/14/2021 1352   PLT 425.0 (H) 08/14/2021 1352   MCV 76.0 (L) 08/14/2021 1352   MCH 26.2 05/24/2014  0620   MCHC 33.0 08/14/2021 1352   RDW 14.8 08/14/2021 1352   LYMPHSABS 2.0 08/14/2021 1352   MONOABS 0.4 08/14/2021 1352   EOSABS 0.2 08/14/2021 1352   BASOSABS 0.1 08/14/2021 1352    No results found for: POCLITH, LITHIUM   No results found for: PHENYTOIN, PHENOBARB, VALPROATE, CBMZ   .res Assessment: Plan:    Teresa Rice was seen today for follow-up, depression, post-traumatic stress disorder and anxiety.  Diagnoses and all orders for this visit:  Major depressive disorder, recurrent episode, moderate (HCC)  Panic disorder with agoraphobia  PTSD (post-traumatic stress disorder)  Insomnia due to mental condition  Delayed sleep phase syndrome Greater than 50% of 30 min face to face time with patient was spent on counseling and coordination of care. We discussed Overall doing worse with family stressors.  Less anxious and depressed with Lexapro.   Chronic avoidance and general fearfulness at baseline. Chronically easily stressed.   Sleep hygiene in detail.  Sleeping too much. Insomnia is better.    Rec exercise.  Supportive therapy around dealing with recent family conflict.  Problems solving on how to deal with this issue from an estranged son and daughter participating in it..    Benefit with Lexapro 10 mg daily.  Be consistent bc has done best on SSRIs.  Don't taper  for a few months.  Does not think she can tolerate anxiety without Xanax.  A lot of benefit and no SE.  We discussed the short-term risks associated with benzodiazepines including sedation and increased fall risk among others.  Discussed long-term side effect risk including dependence, potential withdrawal symptoms, and the potential eventual dose-related risk of dementia.  But recent studies from 2020 dispute this association between benzodiazepines and dementia risk. Newer studies in 2020 do not support an association with dementia.  Satisfied with Xanax now..  Already at significant dose for age.  She has kept benefit.  Tolerated well.  Given age should try to reduce it. Rec she reduce Xanax to 1/2 tab TID and 3/4 tablet at night bc excessive sleep.  Don't change meds on her own..  Tends to want to reduce or stop SSRIs repeatedly and always gets worse.  FU 3 mos  Lynder Parents, MD, DFAPA   Please see After Visit Summary for patient specific instructions.  Future Appointments  Date Time Provider [place]  11/08/2021 11:30 AM Leamon Arnt, MD [facility]  12/21/2021 11:00 AM [facility] HEALTH COACH [facility]    No orders of the defined types were placed in this encounter.     -------------------------------

## 2021-10-16 ENCOUNTER — Other Ambulatory Visit: Payer: Medicare Other

## 2021-10-24 ENCOUNTER — Other Ambulatory Visit: Payer: Self-pay | Admitting: Family Medicine

## 2021-10-26 DIAGNOSIS — Z20822 Contact with and (suspected) exposure to covid-19: Secondary | ICD-10-CM | POA: Diagnosis not present

## 2021-10-27 DIAGNOSIS — Z20822 Contact with and (suspected) exposure to covid-19: Secondary | ICD-10-CM | POA: Diagnosis not present

## 2021-10-28 DIAGNOSIS — Z20822 Contact with and (suspected) exposure to covid-19: Secondary | ICD-10-CM | POA: Diagnosis not present

## 2021-11-08 ENCOUNTER — Other Ambulatory Visit: Payer: Self-pay

## 2021-11-08 ENCOUNTER — Ambulatory Visit (INDEPENDENT_AMBULATORY_CARE_PROVIDER_SITE_OTHER): Payer: Medicare Other | Admitting: Family Medicine

## 2021-11-08 ENCOUNTER — Encounter: Payer: Self-pay | Admitting: Family Medicine

## 2021-11-08 VITALS — BP 140/72 | HR 84 | Temp 97.7°F | Ht 63.0 in | Wt 165.2 lb

## 2021-11-08 DIAGNOSIS — I152 Hypertension secondary to endocrine disorders: Secondary | ICD-10-CM | POA: Diagnosis not present

## 2021-11-08 DIAGNOSIS — Z794 Long term (current) use of insulin: Secondary | ICD-10-CM

## 2021-11-08 DIAGNOSIS — E038 Other specified hypothyroidism: Secondary | ICD-10-CM | POA: Insufficient documentation

## 2021-11-08 DIAGNOSIS — E1159 Type 2 diabetes mellitus with other circulatory complications: Secondary | ICD-10-CM

## 2021-11-08 DIAGNOSIS — E119 Type 2 diabetes mellitus without complications: Secondary | ICD-10-CM | POA: Diagnosis not present

## 2021-11-08 LAB — POCT GLYCOSYLATED HEMOGLOBIN (HGB A1C): Hemoglobin A1C: 6.1 % — AB (ref 4.0–5.6)

## 2021-11-08 NOTE — Patient Instructions (Signed)
Please return in 3 months for your annual complete physical; please come fasting.   Your diabetes is now well controlled again. Monitor your sugars: we want the fastings between 80 and 130 most of the time.  Happy holidays.   Please set up an appointment for a diabetic eye exam and have the results sent to me.   If you have any questions or concerns, please don't hesitate to send me a message via MyChart or call the office at [phone]. Thank you for visiting with [place] today! It's our pleasure caring for you.

## 2021-11-08 NOTE — Progress Notes (Signed)
Subjective  CC:  Chief Complaint  Patient presents with   Hypertension   Diabetes    HPI: Teresa Rice is a 75 y.o. female who presents to the office today for follow up of diabetes and problems listed above in the chief complaint.  Diabetes follow up, short-term 6-week follow-up: Her diabetic control is reported as Improved.  Fasting sugars now running around 110 on average.  Did have 1 reported 57.  No symptoms of hypoglycemia.  Tolerating Lantus 32 units nightly, metformin 1000 twice daily and Trulicity weekly.  Needs to schedule her eye exam. Hypertension: Adjusted medications at last visit.  On a beta-blocker and ARB.  Tolerating well.  No chest pain.  No lower extremity edema.  Wt Readings from Last 3 Encounters:  11/08/21 165 lb 3.2 oz (74.9 kg)  09/26/21 165 lb 12.8 oz (75.2 kg)  08/14/21 165 lb 12.8 oz (75.2 kg)    BP Readings from Last 3 Encounters:  11/08/21 140/72  09/26/21 (!) 142/72  08/14/21 (!) 146/80    Assessment  1. Type 2 diabetes mellitus with insulin therapy ([place])   2. Hypertension associated with diabetes ([place])      Plan  Diabetes is currently very well controlled.  Now well controlled.  May consider decreasing Lantus dose to avoid fasting hypoglycemia.  Education given.  Continue Trulicity and metformin.  Reinforced need for eye exam Hypertension fairly well controlled.  Continue current medications and recheck again in 3 months.  Can consider SGLT2 inhibitor for diabetes or HCTZ.  Follow up: 3 months for complete physical and follow-up. Orders Placed This Encounter  Procedures   POCT HgB A1C   No orders of the defined types were placed in this encounter.     Immunization History  Administered Date(s) Administered   Fluad Quad(high Dose 65+) 08/12/2019, 08/10/2020, 08/14/2021   PFIZER(Purple Top)SARS-COV-2 Vaccination 08/17/2020, 09/09/2020   Pneumococcal Conjugate-13 07/09/2019   Pneumococcal Polysaccharide-23 08/10/2020    Diabetes  Related Lab Review: Lab Results  Component Value Date   HGBA1C 6.1 (A) 11/08/2021   HGBA1C 7.5 (A) 09/26/2021   HGBA1C 10.0 (A) 08/14/2021    Lab Results  Component Value Date   MICROALBUR 1.0 10/14/2019   Lab Results  Component Value Date   CREATININE 0.93 08/14/2021   BUN 16 08/14/2021   NA 136 08/14/2021   K 3.6 08/14/2021   CL 98 08/14/2021   CO2 26 08/14/2021   Lab Results  Component Value Date   CHOL 121 02/08/2021   CHOL 126 01/28/2020   CHOL 212 (H) 10/14/2019   Lab Results  Component Value Date   HDL 29.70 (L) 02/08/2021   HDL 27.90 (L) 01/28/2020   HDL 30.00 (L) 10/14/2019   Lab Results  Component Value Date   LDLCALC 57 02/08/2021   LDLCALC 61 01/28/2020   Lab Results  Component Value Date   TRIG 169.0 (H) 02/08/2021   TRIG 187.0 (H) 01/28/2020   TRIG 265.0 (H) 10/14/2019   Lab Results  Component Value Date   CHOLHDL 4 02/08/2021   CHOLHDL 5 01/28/2020   CHOLHDL 7 10/14/2019   Lab Results  Component Value Date   LDLDIRECT 144.0 10/14/2019   The ASCVD Risk score (Arnett DK, et al., 2019) failed to calculate for the following reasons:   The valid total cholesterol range is 130 to 320 mg/dL I have reviewed the PMH, Fam and Soc history. Patient Active Problem List   Diagnosis Date Noted   History of bladder  cancer 10/14/2019    Priority: High   Acquired hypothyroidism 05/06/2019    Priority: High   Type 2 diabetes mellitus with insulin therapy ([place]) 05/06/2019    Priority: High    Diagnosed in 1996    Hyperlipidemia associated with type 2 diabetes mellitus ([place]) 05/06/2019    Priority: High   Hypertension associated with diabetes ([place]) 05/06/2019    Priority: High   PTSD (post-traumatic stress disorder) 11/10/2018    Priority: High    Followed by dr. Clovis Pu. On xanax.     Subclinical hypothyroidism 11/08/2021    Priority: Medium    Osteopenia 05/05/2020    Priority: Medium     Dexa: 09/2018. Tscore of -1.7. repeat 3 years.      Gastroesophageal reflux disease 05/06/2019    Priority: Medium    Moderate persistent asthma without complication 97/67/3419    Priority: Medium    Chronic bilateral low back pain without sciatica 05/06/2019    Priority: Medium    Primary osteoarthritis of both hands 10/11/2015    Priority: Medium    Morton's neuroma of both feet 08/10/2019    Priority: Low   Seasonal allergies 05/06/2019    Priority: Low   Bilateral carpal tunnel syndrome 10/11/2015    Priority: Low   Trigger finger of both hands 10/11/2015    Priority: Low    Social History: Patient  reports that she quit smoking about 27 years ago. Her smoking use included cigarettes. She has never used smokeless tobacco. She reports current alcohol use. She reports that she does not use drugs.  Review of Systems: Ophthalmic: negative for eye pain, loss of vision or double vision Cardiovascular: negative for chest pain Respiratory: negative for SOB or persistent cough Gastrointestinal: negative for abdominal pain Genitourinary: negative for dysuria or gross hematuria MSK: negative for foot lesions Neurologic: negative for weakness or gait disturbance  Objective  Vitals: BP 140/72    Pulse 84    Temp 97.7 F (36.5 C) (Temporal)    Ht 5\' 3"  (1.6 m)    Wt 165 lb 3.2 oz (74.9 kg)    LMP  (LMP Unknown)    SpO2 95%    BMI 29.26 kg/m  General: well appearing, no acute distress  Psych:  Alert and oriented, normal mood and affect HEENT:  Normocephalic, atraumatic, moist mucous membranes, supple neck  Cardiovascular:  Nl S1 and S2, RRR without murmur, gallop or rub. no edema Respiratory:  Good breath sounds bilaterally, CTAB with normal effort, no rales     Diabetic education: ongoing education regarding chronic disease management for diabetes was given today. We continue to reinforce the ABC's of diabetic management: A1c (<7 or 8 dependent upon patient), tight blood pressure control, and cholesterol management with goal LDL < 100  minimally. We discuss diet strategies, exercise recommendations, medication options and possible side effects. At each visit, we review recommended immunizations and preventive care recommendations for diabetics and stress that good diabetic control can prevent other problems. See below for this patient's data.   Commons side effects, risks, benefits, and alternatives for medications and treatment plan prescribed today were discussed, and the patient expressed understanding of the given instructions. Patient is instructed to call or message via MyChart if he/she has any questions or concerns regarding our treatment plan. No barriers to understanding were identified. We discussed Red Flag symptoms and signs in detail. Patient expressed understanding regarding what to do in case of urgent or emergency type symptoms.  Medication list was  reconciled, printed and provided to the patient in AVS. Patient instructions and summary information was reviewed with the patient as documented in the AVS. This note was prepared with assistance of Dragon voice recognition software. Occasional wrong-word or sound-a-like substitutions may have occurred due to the inherent limitations of voice recognition software  This visit occurred during the SARS-CoV-2 public health emergency.  Safety protocols were in place, including screening questions prior to the visit, additional usage of staff PPE, and extensive cleaning of exam room while observing appropriate contact time as indicated for disinfecting solutions.

## 2021-11-14 ENCOUNTER — Encounter: Payer: Self-pay | Admitting: Family Medicine

## 2021-11-14 ENCOUNTER — Other Ambulatory Visit: Payer: Self-pay

## 2021-11-14 ENCOUNTER — Ambulatory Visit (INDEPENDENT_AMBULATORY_CARE_PROVIDER_SITE_OTHER): Payer: Medicare Other | Admitting: Family Medicine

## 2021-11-14 VITALS — BP 140/80 | HR 99 | Temp 97.6°F | Ht 63.0 in | Wt 158.1 lb

## 2021-11-14 DIAGNOSIS — J069 Acute upper respiratory infection, unspecified: Secondary | ICD-10-CM | POA: Diagnosis not present

## 2021-11-14 DIAGNOSIS — J0110 Acute frontal sinusitis, unspecified: Secondary | ICD-10-CM | POA: Diagnosis not present

## 2021-11-14 MED ORDER — DOXYCYCLINE HYCLATE 100 MG PO TABS: 100.0000 mg | ORAL_TABLET | Freq: Two times a day (BID) | ORAL | 0 refills | Status: AC

## 2021-11-14 MED ORDER — ALBUTEROL SULFATE HFA 108 (90 BASE) MCG/ACT IN AERS: 2.0000 | INHALATION_SPRAY | Freq: Four times a day (QID) | RESPIRATORY_TRACT | 1 refills | Status: DC | PRN

## 2021-11-14 NOTE — Patient Instructions (Addendum)
It was very nice to see you today!  Meds sent to pharmacy. Merry christmas

## 2021-11-14 NOTE — Progress Notes (Signed)
Subjective:     Patient ID: Teresa Rice, female    DOB: 1946-08-13, 75 y.o.   MRN: [id number]  Chief Complaint  Patient presents with   Cough    Started Thursday, productive cough Have not taking any medications    Nasal Congestion    HPI Sick for 5 days. Congestion/coughing. Coughing up green. Nose green. Some pain upper nose. No f/c. A lot of mucus.  Some HA. No SOB.  Checked yesterday-neg covid. No v/d  DM doing well  Health Maintenance Due  Topic Date Due   OPHTHALMOLOGY EXAM  05/17/2021   DEXA SCAN  10/14/2021    Past Medical History:  Diagnosis Date   Bladder cancer ([place]) first dx 2012   Recurrent Bladder Cancer--  (urologist-  dr Diona Fanti)   Chronic constipation    Closed fracture of left distal radius 08/30/2016   Full dentures    GERD (gastroesophageal reflux disease)    Hyperlipidemia    Hypertension    Mild intermittent asthma    OA (osteoarthritis)    fingers    Trigger finger of both hands    wear slints and special gloves at night   Type 2 diabetes mellitus [facility])     Past Surgical History:  Procedure Laterality Date   BLADDER SURGERY  2012   in [place]   CYSTOSCOPY/RETROGRADE/URETEROSCOPY Left 05/24/2014   Procedure: CYSTOSCOPY/RETROGRADE/ URETEROSCOPY;  Surgeon: Jorja Loa, MD;  Location: [facility];  Service: Urology;  Laterality: Left;   TRANSURETHRAL RESECTION OF BLADDER TUMOR N/A 05/24/2014   Procedure: TRANSURETHRAL RESECTION OF BLADDER TUMOR (TURBT) ;  Surgeon: Jorja Loa, MD;  Location: [facility];  Service: Urology;  Laterality: N/A;   TRANSURETHRAL RESECTION OF BLADDER TUMOR N/A 08/04/2015   Procedure: TRANSURETHRAL RESECTION OF BLADDER TUMOR (TURBT);  Surgeon: Franchot Gallo, MD;  Location: [facility];  Service: Urology;  Laterality: N/A;    Outpatient Medications Prior to Visit  Medication Sig Dispense Refill   Accu-Chek Softclix Lancets lancets      ALPRAZolam  (XANAX) 1 MG tablet 1/2 tablet TID and 3/4 tablet at night 71 tablet 5   amLODipine (NORVASC) 10 MG tablet Take 1 tablet (10 mg total) by mouth daily. 90 tablet 3   azelastine (ASTELIN) 0.1 % nasal spray Place 1 spray into both nostrils in the morning. Use in each nostril as directed 30 mL 2   B-D UF III MINI PEN NEEDLES 31G X 5 MM MISC USE AS DIRECTED WITH  LANTUS  INJECTION 100 each 0   blood glucose meter kit and supplies Dispense based on patient and insurance preference. Use up to four times daily as directed. (FOR ICD-10 E10.9, E11.9). 1 each 0   cholecalciferol (VITAMIN D3) 25 MCG (1000 UNIT) tablet Take 1,000 Units by mouth daily.     escitalopram (LEXAPRO) 10 MG tablet Take 1 tablet (10 mg total) by mouth at bedtime. 90 tablet 1   fenofibrate 160 MG tablet Take 1 tablet (160 mg total) by mouth daily. 90 tablet 3   fluticasone (FLONASE) 50 MCG/ACT nasal spray Place 2 sprays into both nostrils daily. 16 g 6   glucose blood test strip Use as instructed 100 each 12   hydrochlorothiazide (HYDRODIURIL) 25 MG tablet Take 25 mg by mouth daily.     insulin glargine (LANTUS) 100 UNIT/ML Solostar Pen Inject 32 Units into the skin daily.     Insulin Pen Needle (PEN  NEEDLES) 32G X 5 MM MISC Please use new needle after very Lantus injection. 100 each 3   levothyroxine (SYNTHROID) 25 MCG tablet Take one pill daily for thyroid 90 tablet 3   losartan (COZAAR) 100 MG tablet Take 1 tablet by mouth once daily 90 tablet 0   magnesium oxide (MAG-OX) 400 MG tablet Take 400 mg by mouth 2 (two) times daily.     metFORMIN (GLUCOPHAGE) 1000 MG tablet Take 1 tablet by mouth twice daily with food 180 tablet 0   metoprolol succinate (TOPROL-XL) 25 MG 24 hr tablet Take 1 tablet (25 mg total) by mouth daily. 90 tablet 3   montelukast (SINGULAIR) 10 MG tablet Take 1 tablet (10 mg total) by mouth at bedtime. 90 tablet 1   Multiple Vitamin (MULTIVITAMIN WITH MINERALS) TABS tablet Take 1 tablet by mouth daily.     Omega 3  1000 MG CAPS Take 1,000 mg by mouth daily.     omeprazole (PRILOSEC) 20 MG capsule Take 1 capsule by mouth once daily 90 capsule 0   Polyethylene Glycol 3350 (MIRALAX PO) Take by mouth every evening.     rosuvastatin (CRESTOR) 10 MG tablet Take 1 tablet (10 mg total) by mouth daily. 90 tablet 3   Semaglutide, 1 MG/DOSE, 4 MG/3ML SOPN Inject 1 mg as directed once a week. 9 mL 3   SYMBICORT 160-4.5 MCG/ACT inhaler Inhale 2 puffs into the lungs 2 (two) times daily. 11 g 11   traMADol (ULTRAM) 50 MG tablet Take 1 tablet by mouth every 6 (six) hours as needed.     VITAMIN E PO Take by mouth.     No facility-administered medications prior to visit.    Allergies  Allergen Reactions   Celebrex [Celecoxib] Anaphylaxis   Glipizide Itching   Penicillins Other (See Comments)    "Burn marks from inside out"    Sulfa Antibiotics Itching   OAC:[id number] except as noted in HPI      Objective:     BP 140/80    Pulse 99    Temp 97.6 F (36.4 C) (Temporal)    Ht _0  (1.6 m)    Wt 158 lb 2 oz (71.7 kg)    LMP  (LMP Unknown)    SpO2 97%    BMI 28.01 kg/m  Wt Readings from Last 3 Encounters:  11/14/21 158 lb 2 oz (71.7 kg)  11/08/21 165 lb 3.2 oz (74.9 kg)  09/26/21 165 lb 12.8 oz (75.2 kg)        Gen: WDWN NAD WF HEENT: NCAT, conjunctiva not injected, sclera nonicteric TM WNL B, OP moist, no exudates .  Frontal sinuses tender NECK:  supple, no thyromegaly, no nodes, no carotid bruits CARDIAC: RRR, S1S2+, no murmur. DP 2+B LUNGS: CTAB. No wheezes ABDOMEN:  BS+, soft, NTND, No HSM, no masses EXT:  no edema MSK: no gross abnormalities.  NEURO: A&O x3.  CN II-XII intact.  PSYCH: normal mood. Good eye contact  Assessment & Plan:   Problem List Items Addressed This Visit   None Visit Diagnoses     Acute non-recurrent frontal sinusitis    -  Primary   Relevant Medications   doxycycline (VIBRA-TABS) 100 MG tablet   Viral upper respiratory tract infection           URI-possibly bronchitis-doxy as a lot of comorbidities.  Albuterol inhaler Frontal sinusitis-doxy.  Worse, sob, etc-ER or f/u  Meds ordered this encounter  Medications   doxycycline (VIBRA-TABS) 100 MG tablet  Sig: Take 1 tablet (100 mg total) by mouth 2 (two) times daily for 10 days.    Dispense:  20 tablet    Refill:  0   albuterol (VENTOLIN HFA) 108 (90 Base) MCG/ACT inhaler    Sig: Inhale 2 puffs into the lungs every 6 (six) hours as needed for wheezing or shortness of breath.    Dispense:  8 g    Refill:  1    Wellington Hampshire., MD

## 2021-11-27 DIAGNOSIS — Z20822 Contact with and (suspected) exposure to covid-19: Secondary | ICD-10-CM | POA: Diagnosis not present

## 2021-11-28 DIAGNOSIS — Z20822 Contact with and (suspected) exposure to covid-19: Secondary | ICD-10-CM | POA: Diagnosis not present

## 2021-11-29 DIAGNOSIS — Z20822 Contact with and (suspected) exposure to covid-19: Secondary | ICD-10-CM | POA: Diagnosis not present

## 2021-12-14 ENCOUNTER — Other Ambulatory Visit: Payer: Self-pay | Admitting: Family Medicine

## 2021-12-21 ENCOUNTER — Ambulatory Visit: Payer: Medicare Other

## 2021-12-28 DIAGNOSIS — Z20822 Contact with and (suspected) exposure to covid-19: Secondary | ICD-10-CM | POA: Diagnosis not present

## 2021-12-29 DIAGNOSIS — Z20822 Contact with and (suspected) exposure to covid-19: Secondary | ICD-10-CM | POA: Diagnosis not present

## 2021-12-30 DIAGNOSIS — Z20822 Contact with and (suspected) exposure to covid-19: Secondary | ICD-10-CM | POA: Diagnosis not present

## 2022-01-20 IMAGING — DX DG KNEE COMPLETE 4+V*R*
4 series · 4 of 4 positions shown · non-contrast
Comparison: None.

CLINICAL DATA: Knee pain, injury

EXAM:
RIGHT KNEE - COMPLETE 4+ VIEW

[knee ap (1 of 3)]
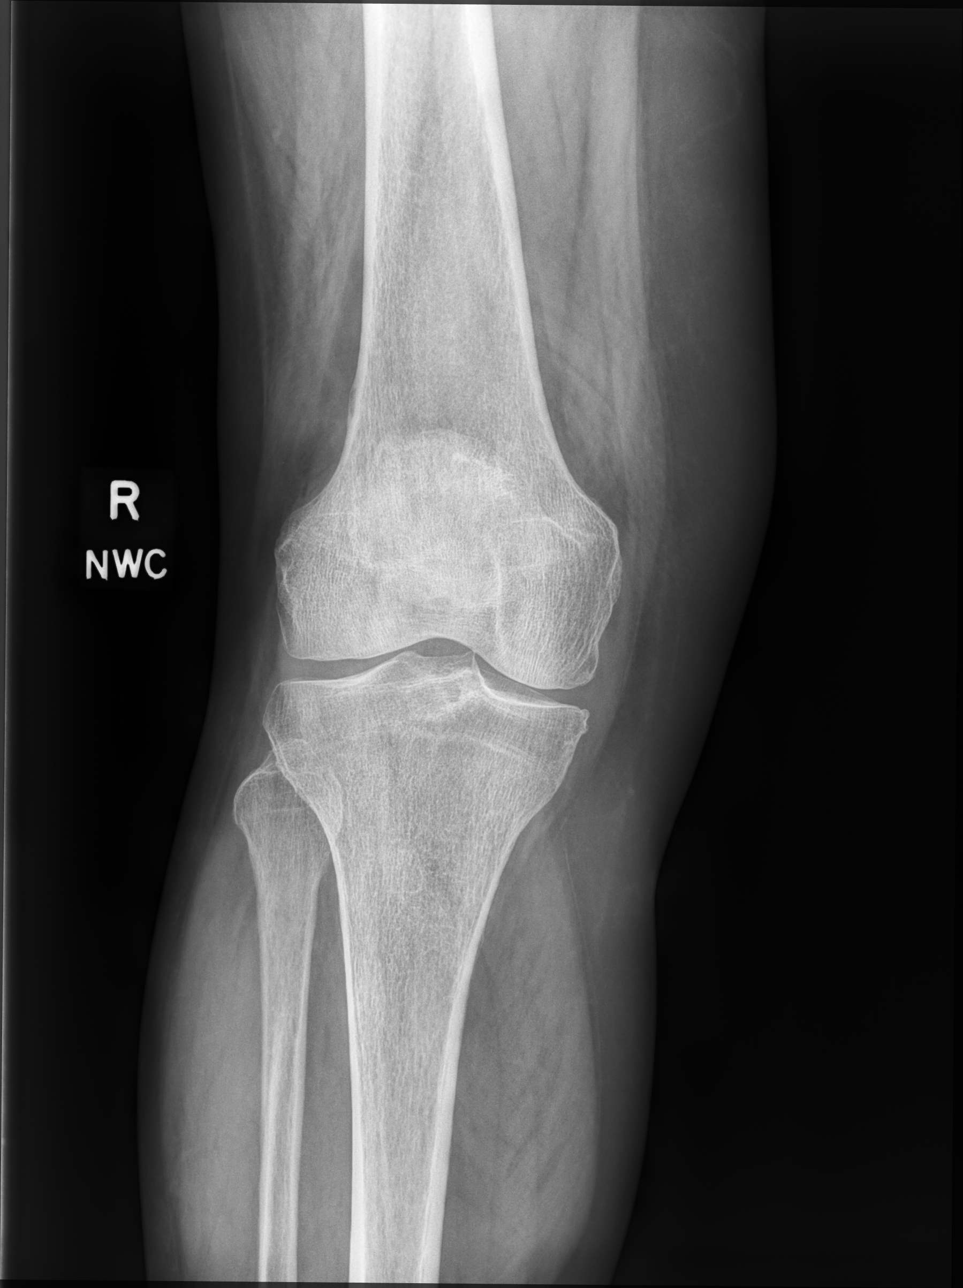

[knee ap (2 of 3)]
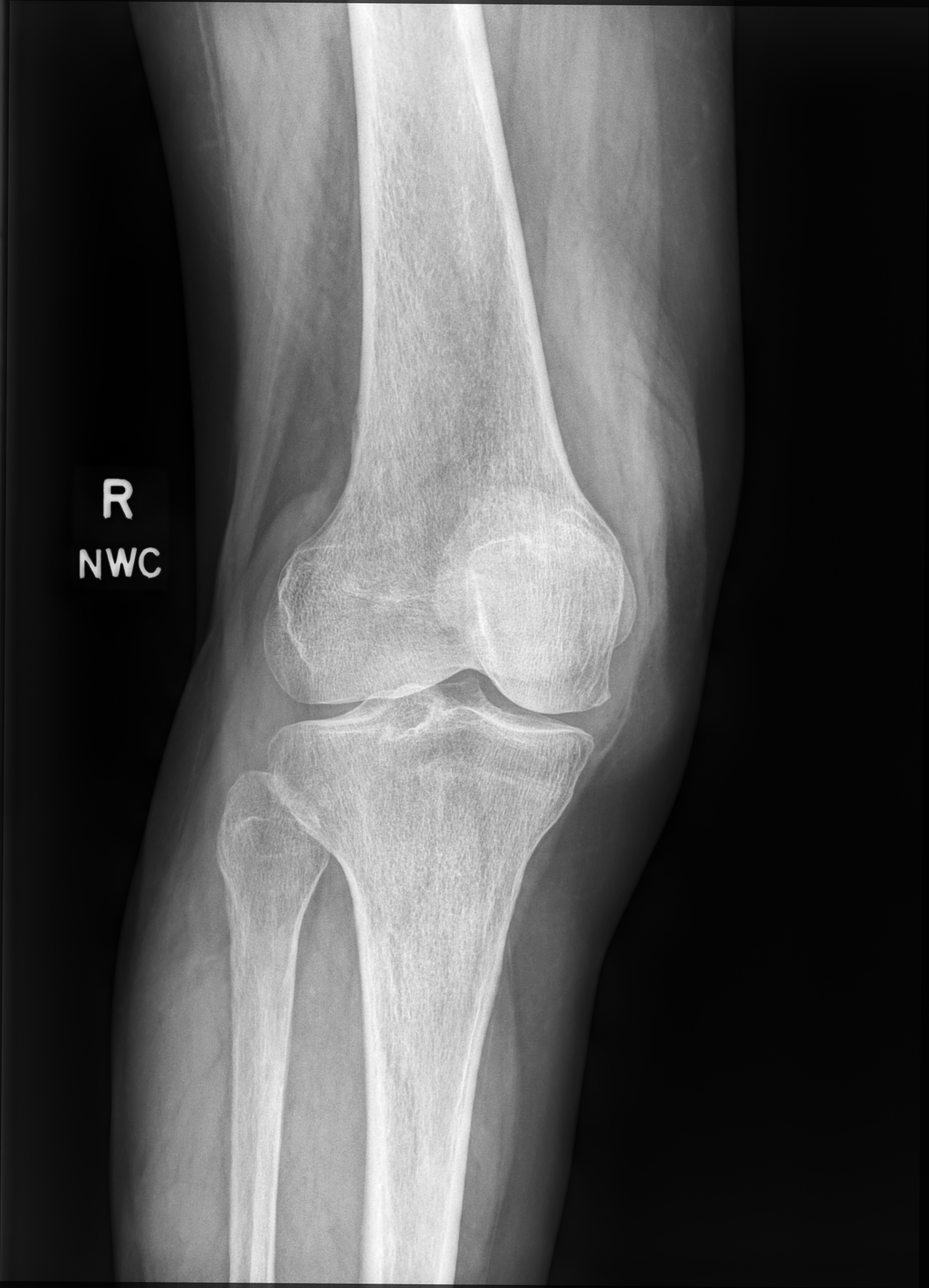

[knee ap (3 of 3)]
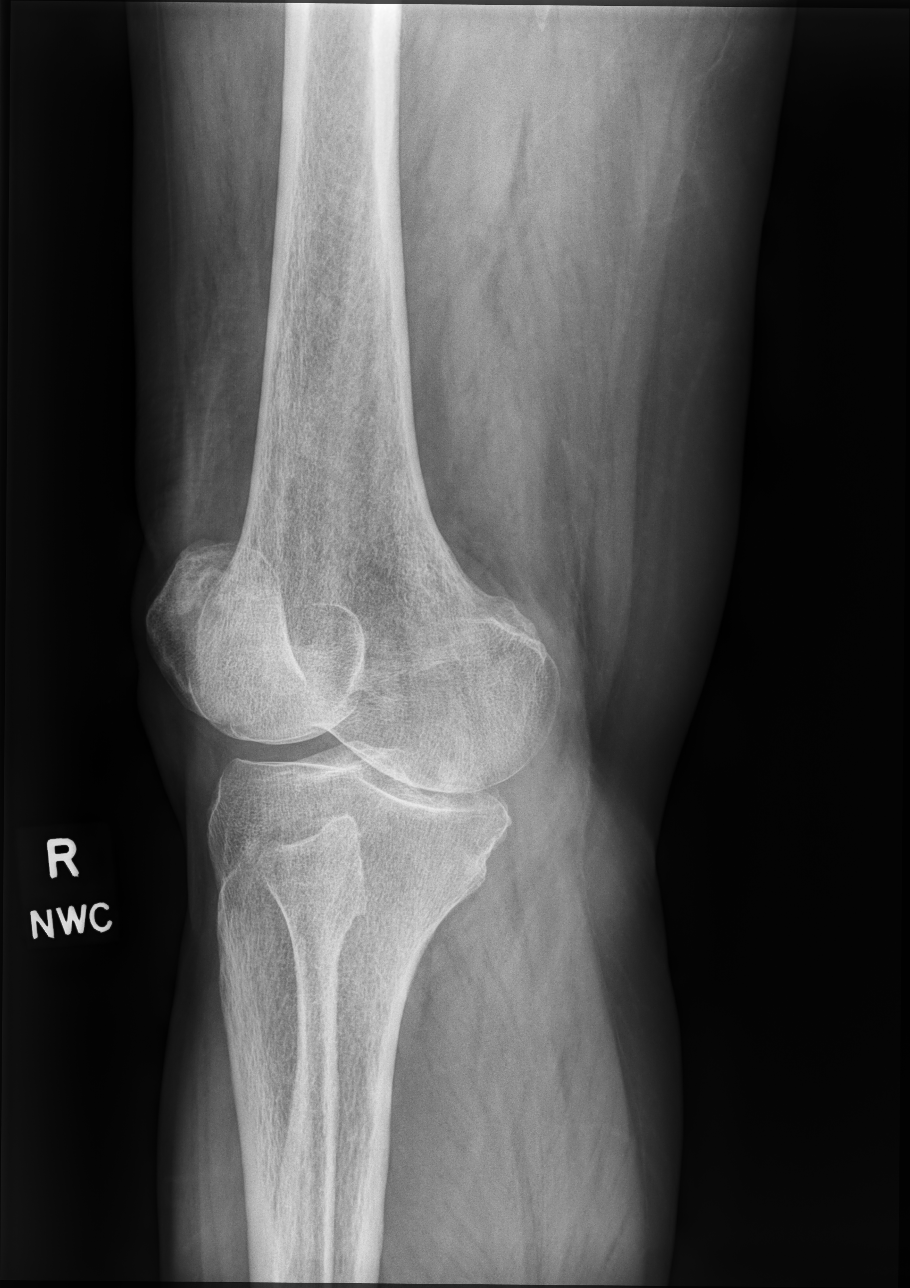

[knee lat]
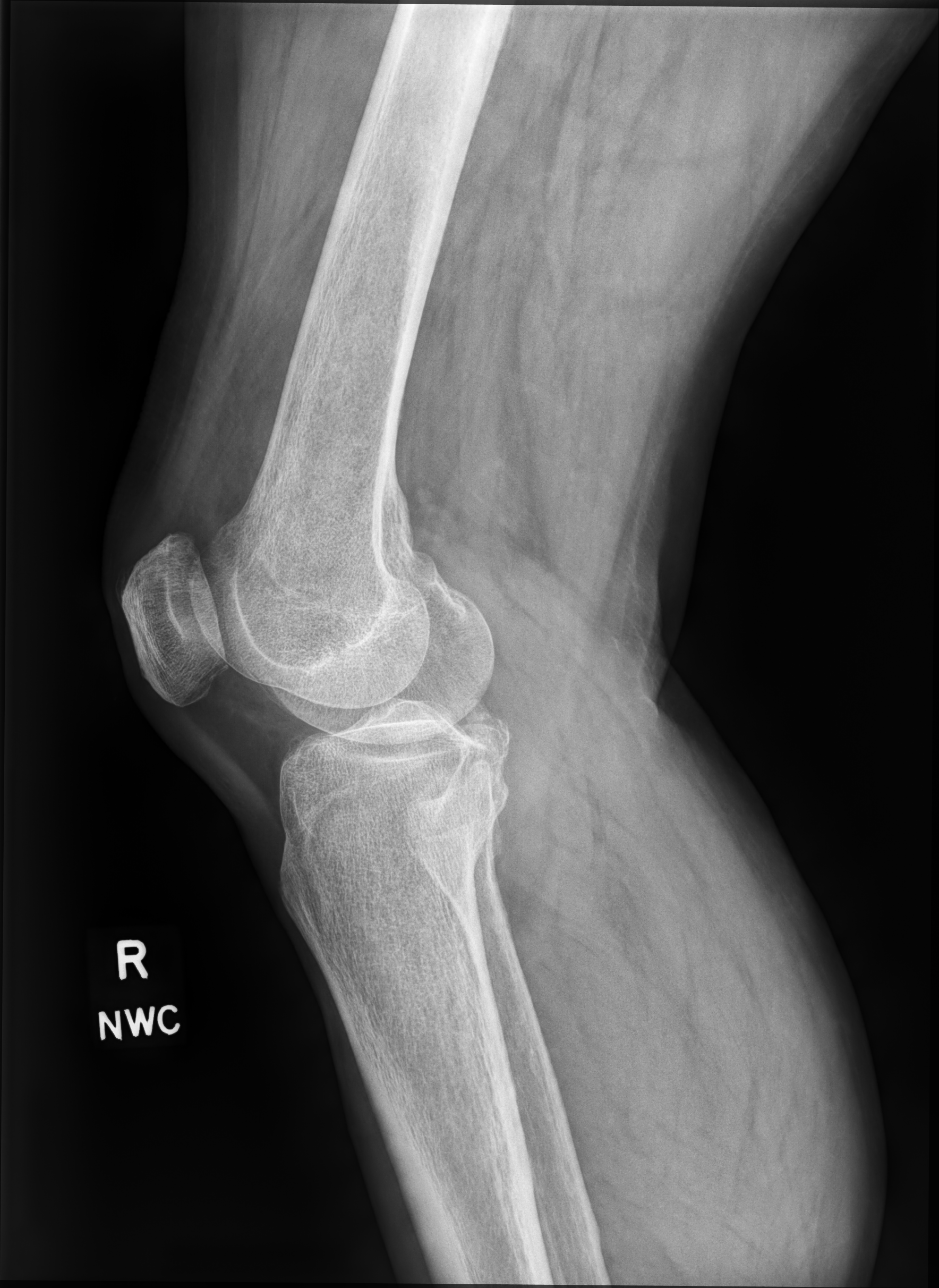

[4 of 4 positions shown; findings below may reference images not displayed]

FINDINGS: Frontal, bilateral oblique, and lateral views of the right knee are
obtained. There is mild medial compartment osteoarthritis with joint
space narrowing and osteophyte formation. No fracture, subluxation,
or dislocation. No joint effusion.
IMPRESSION: 1. Mild medial compartmental osteoarthritis.

## 2022-01-26 ENCOUNTER — Other Ambulatory Visit: Payer: Self-pay | Admitting: Family Medicine

## 2022-01-31 ENCOUNTER — Encounter: Payer: Self-pay | Admitting: Family Medicine

## 2022-01-31 ENCOUNTER — Telehealth: Payer: Self-pay

## 2022-01-31 ENCOUNTER — Ambulatory Visit (INDEPENDENT_AMBULATORY_CARE_PROVIDER_SITE_OTHER): Payer: Medicare Other | Admitting: Family Medicine

## 2022-01-31 VITALS — BP 106/60 | HR 110 | Temp 97.5°F | Ht 63.0 in | Wt 165.4 lb

## 2022-01-31 DIAGNOSIS — B9689 Other specified bacterial agents as the cause of diseases classified elsewhere: Secondary | ICD-10-CM | POA: Diagnosis not present

## 2022-01-31 DIAGNOSIS — E119 Type 2 diabetes mellitus without complications: Secondary | ICD-10-CM | POA: Diagnosis not present

## 2022-01-31 DIAGNOSIS — J208 Acute bronchitis due to other specified organisms: Secondary | ICD-10-CM

## 2022-01-31 DIAGNOSIS — Z794 Long term (current) use of insulin: Secondary | ICD-10-CM | POA: Diagnosis not present

## 2022-01-31 DIAGNOSIS — R6889 Other general symptoms and signs: Secondary | ICD-10-CM | POA: Diagnosis not present

## 2022-01-31 DIAGNOSIS — J4540 Moderate persistent asthma, uncomplicated: Secondary | ICD-10-CM

## 2022-01-31 LAB — POC COVID19 BINAXNOW: SARS Coronavirus 2 Ag: NEGATIVE

## 2022-01-31 LAB — POCT INFLUENZA A/B
Influenza A, POC: NEGATIVE
Influenza B, POC: NEGATIVE

## 2022-01-31 MED ORDER — AZITHROMYCIN 250 MG PO TABS: ORAL_TABLET | ORAL | 0 refills | Status: DC

## 2022-01-31 MED ORDER — GUAIFENESIN-CODEINE 100-10 MG/5ML PO SOLN: 5.0000 mL | Freq: Four times a day (QID) | ORAL | 0 refills | Status: DC | PRN

## 2022-01-31 NOTE — Patient Instructions (Signed)
Please follow up as scheduled for your next visit with me: 02/06/2022  ? ?If you have any questions or concerns, please don't hesitate to send me a message via MyChart or call the office at [phone]. Thank you for visiting with [place] today! It's our pleasure caring for you.  ? ?Take the antibiotic and cough medication as needed. The cough syrup has codeine in it so it may make you sleepy. Treat your fevers and body aches with tylenol and advil as needed.  ? ?Monitor your sugars and decrease your lantus by half IF you are not eating well. ?

## 2022-01-31 NOTE — Telephone Encounter (Signed)
Pt's daughter- Teresa Rice is calling to report that she is worried about her Mother. She states that she is not remembering things. She is constantly repeating herself. She states asking [place]elda to leave her doors unlocked incase someone needs to get to her fast, but always locks her doors and says that she forgets. She mentions that she is always asking numerous questions about the same situations. They have a tracker on her because she keeps her grand kids and don't want her to get lost with them. Pt says that she is really worried about her. She was told to attend her next appointment to express concerns. She agrees with plan, and has no further concerns.  ?

## 2022-01-31 NOTE — Progress Notes (Signed)
Subjective  CC:  Chief Complaint  Patient presents with   Cough    All symptoms started Saturday. She has taken Advil for pain. Tested negative for at home Covid Test yesterday.    Nasal Congestion   Facial Pain   Fever    Highest temp has been 102.5 last night.     HPI: SUBJECTIVE:  Teresa Rice is a 76 y.o. female  with diabetes and asthma who complains of 4 to 5-day history of URI symptoms including congestion, mild sore throat, productive cough, fevers to 102, chills and body aches.  She denies shortness of breath or chest pain.  Last week she had [place] virus but that improved.  Now feels weak.  She took a home COVID test which was negative.  No known exposures to flu or COVID.  She denies wheezing or tightness in the chest.  No dyspnea on exertion.  No chest pain or palpitations.  Appetite is down but she is able to eat.  She is monitoring her sugars and fasting sugars have been in the low 100s.  No GI symptoms currently.  Assessment  1. Acute bacterial bronchitis   2. Flu-like symptoms   3. Type 2 diabetes mellitus with insulin therapy ([place])   4. Moderate persistent asthma without complication      Plan  Discussion:  Treat for bacterial bronchitis due productive cough and high risk patient with fevers and history of asthma.  Cover with Z-Pak and Robitussin-AC.  Monitor for wheezing or worsening symptoms.  Treat fevers.  Hydrate.  Discussed decreasing Lantus dose if appetite or oral intake is lessening.  Continue to monitor sugars.  Respiratory status is currently stable education regarding differences between viral and bacterial infections and treatment options are discussed.  Supportive care measures are recommended.  We discussed the use of mucolytic's, decongestants, antihistamines and antitussives as needed.  Tylenol or Advil are recommended if needed.  Follow up: As needed  Orders Placed This Encounter  Procedures   POCT Influenza A/B   POC COVID-19   Meds ordered  this encounter  Medications   azithromycin (ZITHROMAX) 250 MG tablet    Sig: Take 2 tabs today, then 1 tab daily for 4 days    Dispense:  1 each    Refill:  0   guaiFENesin-codeine 100-10 MG/5ML syrup    Sig: Take 5 mLs by mouth every 6 (six) hours as needed for cough.    Dispense:  120 mL    Refill:  0      I reviewed the patients updated PMH, FH, and SocHx.  Social History: Patient  reports that she quit smoking about 27 years ago. Her smoking use included cigarettes. She has never used smokeless tobacco. She reports current alcohol use. She reports that she does not use drugs.  Patient Active Problem List   Diagnosis Date Noted   History of bladder cancer 10/14/2019    Priority: High   Acquired hypothyroidism 05/06/2019    Priority: High   Type 2 diabetes mellitus with insulin therapy ([place]) 05/06/2019    Priority: High   Hyperlipidemia associated with type 2 diabetes mellitus ([place]) 05/06/2019    Priority: High   Hypertension associated with diabetes ([place]) 05/06/2019    Priority: High   PTSD (post-traumatic stress disorder) 11/10/2018    Priority: High   Subclinical hypothyroidism 11/08/2021    Priority: Medium    Osteopenia 05/05/2020    Priority: Medium    Gastroesophageal reflux disease 05/06/2019  Priority: Medium    Moderate persistent asthma without complication 17/00/1749    Priority: Medium    Chronic bilateral low back pain without sciatica 05/06/2019    Priority: Medium    Primary osteoarthritis of both hands 10/11/2015    Priority: Medium    Morton's neuroma of both feet 08/10/2019    Priority: Low   Seasonal allergies 05/06/2019    Priority: Low   Bilateral carpal tunnel syndrome 10/11/2015    Priority: Low   Trigger finger of both hands 10/11/2015    Priority: Low    Review of Systems: Cardiovascular: negative for chest pain Respiratory: negative for SOB or hemoptysis Gastrointestinal: negative for abdominal pain Genitourinary: negative for  dysuria or gross hematuria Current Meds  Medication Sig   Accu-Chek Softclix Lancets lancets    albuterol (VENTOLIN HFA) 108 (90 Base) MCG/ACT inhaler Inhale 2 puffs into the lungs every 6 (six) hours as needed for wheezing or shortness of breath.   ALPRAZolam (XANAX) 1 MG tablet 1/2 tablet TID and 3/4 tablet at night   amLODipine (NORVASC) 10 MG tablet Take 1 tablet (10 mg total) by mouth daily.   azelastine (ASTELIN) 0.1 % nasal spray Place 1 spray into both nostrils in the morning. Use in each nostril as directed   azithromycin (ZITHROMAX) 250 MG tablet Take 2 tabs today, then 1 tab daily for 4 days   B-D UF III MINI PEN NEEDLES 31G X 5 MM MISC USE AS DIRECTED WITH LANTUS INJECTION   blood glucose meter kit and supplies Dispense based on patient and insurance preference. Use up to four times daily as directed. (FOR ICD-10 E10.9, E11.9).   cholecalciferol (VITAMIN D3) 25 MCG (1000 UNIT) tablet Take 1,000 Units by mouth daily.   escitalopram (LEXAPRO) 10 MG tablet Take 1 tablet (10 mg total) by mouth at bedtime.   fenofibrate 160 MG tablet Take 1 tablet (160 mg total) by mouth daily.   fluticasone (FLONASE) 50 MCG/ACT nasal spray Place 2 sprays into both nostrils daily.   glucose blood test strip Use as instructed   guaiFENesin-codeine 100-10 MG/5ML syrup Take 5 mLs by mouth every 6 (six) hours as needed for cough.   hydrochlorothiazide (HYDRODIURIL) 25 MG tablet Take 25 mg by mouth daily.   insulin glargine (LANTUS) 100 UNIT/ML Solostar Pen Inject 32 Units into the skin daily.   Insulin Pen Needle (PEN NEEDLES) 32G X 5 MM MISC Please use new needle after very Lantus injection.   levothyroxine (SYNTHROID) 25 MCG tablet Take one pill daily for thyroid   losartan (COZAAR) 100 MG tablet Take 1 tablet by mouth once daily   magnesium oxide (MAG-OX) 400 MG tablet Take 400 mg by mouth 2 (two) times daily.   metFORMIN (GLUCOPHAGE) 1000 MG tablet Take 1 tablet by mouth twice daily with food    metoprolol succinate (TOPROL-XL) 25 MG 24 hr tablet Take 1 tablet (25 mg total) by mouth daily.   montelukast (SINGULAIR) 10 MG tablet Take 1 tablet (10 mg total) by mouth at bedtime.   Multiple Vitamin (MULTIVITAMIN WITH MINERALS) TABS tablet Take 1 tablet by mouth daily.   Omega 3 1000 MG CAPS Take 1,000 mg by mouth daily.   omeprazole (PRILOSEC) 20 MG capsule Take 1 capsule by mouth once daily   Polyethylene Glycol 3350 (MIRALAX PO) Take by mouth every evening.   rosuvastatin (CRESTOR) 10 MG tablet Take 1 tablet (10 mg total) by mouth daily.   Semaglutide, 1 MG/DOSE, 4 MG/3ML SOPN Inject 1  mg as directed once a week.   SYMBICORT 160-4.5 MCG/ACT inhaler Inhale 2 puffs into the lungs 2 (two) times daily.   traMADol (ULTRAM) 50 MG tablet Take 1 tablet by mouth every 6 (six) hours as needed.   VITAMIN E PO Take by mouth.    Objective  Vitals: BP 106/60    Pulse (!) 110    Temp (!) 97.5 F (36.4 C) (Temporal)    Ht _0  (1.6 m)    Wt 165 lb 6.4 oz (75 kg)    LMP  (LMP Unknown)    SpO2 93%    BMI 29.30 kg/m  General: no acute distress, nontoxic-appearing but appears tired Psych:  Alert and oriented, normal mood and affect HEENT:  Normocephalic, atraumatic, supple neck, moist mucous membranes, mildly erythematous pharynx without exudate, mild lymphadenopathy, supple neck Cardiovascular:  RRR without murmur. no edema Respiratory:  Good breath sounds bilaterally, CTAB with normal respiratory effort without wheezing or rhonchi or rales skin:  Warm, no rashes Neurologic:   Mental status is normal. normal gait  Office Visit on 01/31/2022  Component Date Value Ref Range Status   Influenza A, POC 01/31/2022 Negative  Negative Final   Influenza B, POC 01/31/2022 Negative  Negative Final   SARS Coronavirus 2 Ag 01/31/2022 Negative  Negative Final    Commons side effects, risks, benefits, and alternatives for medications and treatment plan prescribed today were discussed, and the patient expressed  understanding of the given instructions. Patient is instructed to call or message via MyChart if he/she has any questions or concerns regarding our treatment plan. No barriers to understanding were identified. We discussed Red Flag symptoms and signs in detail. Patient expressed understanding regarding what to do in case of urgent or emergency type symptoms.  Medication list was reconciled, printed and provided to the patient in AVS. Patient instructions and summary information was reviewed with the patient as documented in the AVS. This note was prepared with assistance of Dragon voice recognition software. Occasional wrong-word or sound-a-like substitutions may have occurred due to the inherent limitations of voice recognition software

## 2022-02-01 ENCOUNTER — Other Ambulatory Visit: Payer: Self-pay | Admitting: Family Medicine

## 2022-02-06 ENCOUNTER — Encounter: Payer: Self-pay | Admitting: Family Medicine

## 2022-02-06 ENCOUNTER — Ambulatory Visit (INDEPENDENT_AMBULATORY_CARE_PROVIDER_SITE_OTHER): Payer: Medicare Other | Admitting: Family Medicine

## 2022-02-06 VITALS — BP 128/72 | HR 91 | Temp 97.2°F | Ht 63.0 in | Wt 162.6 lb

## 2022-02-06 DIAGNOSIS — D509 Iron deficiency anemia, unspecified: Secondary | ICD-10-CM

## 2022-02-06 DIAGNOSIS — Z794 Long term (current) use of insulin: Secondary | ICD-10-CM

## 2022-02-06 DIAGNOSIS — E119 Type 2 diabetes mellitus without complications: Secondary | ICD-10-CM | POA: Diagnosis not present

## 2022-02-06 DIAGNOSIS — Z8551 Personal history of malignant neoplasm of bladder: Secondary | ICD-10-CM | POA: Diagnosis not present

## 2022-02-06 DIAGNOSIS — E039 Hypothyroidism, unspecified: Secondary | ICD-10-CM | POA: Diagnosis not present

## 2022-02-06 DIAGNOSIS — I152 Hypertension secondary to endocrine disorders: Secondary | ICD-10-CM

## 2022-02-06 DIAGNOSIS — K219 Gastro-esophageal reflux disease without esophagitis: Secondary | ICD-10-CM

## 2022-02-06 DIAGNOSIS — E785 Hyperlipidemia, unspecified: Secondary | ICD-10-CM | POA: Diagnosis not present

## 2022-02-06 DIAGNOSIS — E1159 Type 2 diabetes mellitus with other circulatory complications: Secondary | ICD-10-CM | POA: Diagnosis not present

## 2022-02-06 DIAGNOSIS — Z0000 Encounter for general adult medical examination without abnormal findings: Secondary | ICD-10-CM

## 2022-02-06 DIAGNOSIS — F331 Major depressive disorder, recurrent, moderate: Secondary | ICD-10-CM

## 2022-02-06 DIAGNOSIS — M19041 Primary osteoarthritis, right hand: Secondary | ICD-10-CM

## 2022-02-06 DIAGNOSIS — Z1211 Encounter for screening for malignant neoplasm of colon: Secondary | ICD-10-CM | POA: Diagnosis not present

## 2022-02-06 DIAGNOSIS — Z1212 Encounter for screening for malignant neoplasm of rectum: Secondary | ICD-10-CM | POA: Diagnosis not present

## 2022-02-06 DIAGNOSIS — M19042 Primary osteoarthritis, left hand: Secondary | ICD-10-CM

## 2022-02-06 DIAGNOSIS — E1169 Type 2 diabetes mellitus with other specified complication: Secondary | ICD-10-CM

## 2022-02-06 LAB — COMPREHENSIVE METABOLIC PANEL
ALT: 9 U/L (ref 0–35)
AST: 13 U/L (ref 0–37)
Albumin: 3.9 g/dL (ref 3.5–5.2)
Alkaline Phosphatase: 79 U/L (ref 39–117)
BUN: 18 mg/dL (ref 6–23)
CO2: 27 mEq/L (ref 19–32)
Calcium: 10 mg/dL (ref 8.4–10.5)
Chloride: 105 mEq/L (ref 96–112)
Creatinine, Ser: 0.93 mg/dL (ref 0.40–1.20)
GFR: 59.75 mL/min — ABNORMAL LOW (ref >60.00)
Glucose, Bld: 117 mg/dL — ABNORMAL HIGH (ref 70–99)
Potassium: 4.3 mEq/L (ref 3.5–5.1)
Sodium: 141 mEq/L (ref 135–145)
Total Bilirubin: 0.4 mg/dL (ref 0.2–1.2)
Total Protein: 7.4 g/dL (ref 6.0–8.3)

## 2022-02-06 LAB — LIPID PANEL
Cholesterol: 92 mg/dL (ref 0–200)
HDL: 22.4 mg/dL — ABNORMAL LOW (ref >39.00)
LDL Cholesterol: 33 mg/dL (ref 0–99)
NonHDL: 69.9
Total CHOL/HDL Ratio: 4
Triglycerides: 185 mg/dL — ABNORMAL HIGH (ref 0.0–149.0)
VLDL: 37 mg/dL (ref 0.0–40.0)

## 2022-02-06 LAB — CBC WITH DIFFERENTIAL/PLATELET
Basophils Absolute: 0.1 10*3/uL (ref 0.0–0.1)
Basophils Relative: 0.5 % (ref 0.0–3.0)
Eosinophils Absolute: 0.2 10*3/uL (ref 0.0–0.7)
Eosinophils Relative: 1.7 % (ref 0.0–5.0)
HCT: 33.5 % — ABNORMAL LOW (ref 36.0–46.0)
Hemoglobin: 10.9 g/dL — ABNORMAL LOW (ref 12.0–15.0)
Lymphocytes Relative: 22.6 % (ref 12.0–46.0)
Lymphs Abs: 2.3 10*3/uL (ref 0.7–4.0)
MCHC: 32.5 g/dL (ref 30.0–36.0)
MCV: 77.1 fl — ABNORMAL LOW (ref 78.0–100.0)
Monocytes Absolute: 0.6 10*3/uL (ref 0.1–1.0)
Monocytes Relative: 5.9 % (ref 3.0–12.0)
Neutro Abs: 7.1 10*3/uL (ref 1.4–7.7)
Neutrophils Relative %: 69.3 % (ref 43.0–77.0)
Platelets: 608 10*3/uL — ABNORMAL HIGH (ref 150.0–400.0)
RBC: 4.34 Mil/uL (ref 3.87–5.11)
RDW: 15.3 % (ref 11.5–15.5)
WBC: 10.3 10*3/uL (ref 4.0–10.5)

## 2022-02-06 LAB — TSH: TSH: 2.43 u[IU]/mL (ref 0.35–5.50)

## 2022-02-06 LAB — MICROALBUMIN / CREATININE URINE RATIO
Creatinine,U: 108.5 mg/dL
Microalb Creat Ratio: 1.4 mg/g (ref 0.0–30.0)
Microalb, Ur: 1.5 mg/dL (ref 0.0–1.9)

## 2022-02-06 LAB — HEMOGLOBIN A1C: Hgb A1c MFr Bld: 7 % — ABNORMAL HIGH (ref 4.6–6.5)

## 2022-02-06 MED ORDER — METFORMIN HCL 1000 MG PO TABS: 1000.0000 mg | ORAL_TABLET | Freq: Two times a day (BID) | ORAL | 3 refills | Status: DC

## 2022-02-06 MED ORDER — ROSUVASTATIN CALCIUM 10 MG PO TABS: 10.0000 mg | ORAL_TABLET | Freq: Every day | ORAL | 3 refills | Status: DC

## 2022-02-06 MED ORDER — OMEPRAZOLE 20 MG PO CPDR: 20.0000 mg | DELAYED_RELEASE_CAPSULE | Freq: Every day | ORAL | 3 refills | Status: DC

## 2022-02-06 MED ORDER — LOSARTAN POTASSIUM 100 MG PO TABS: 100.0000 mg | ORAL_TABLET | Freq: Every day | ORAL | 3 refills | Status: DC

## 2022-02-06 NOTE — Progress Notes (Signed)
?Subjective  ?Chief Complaint  ?Patient presents with  ? Asthma  ? Hypertension  ?  Pt is fasting.   ? Gastroesophageal Reflux  ? Diabetes  ? Hyperlipidemia  ? Hypothyroidism  ? Hand Pain  ?  Left pointer finger, started a month ago.  ? ? ?HPI: Teresa Rice is a 76 y.o. female who presents to [facility] today for a Female Wellness Visit. She also has the concerns and/or needs as listed above in the chief complaint. These will be addressed in addition to the Health Maintenance Visit.  ? ?Wellness Visit: annual visit with health maintenance review and exam without Pap ? ?Health maintenance: Overdue for diabetic eye exam.  Defers Shingrix vaccinations.  Refuses colon cancer screens of any type.  Has never had colonoscopy.  Counseling done. Overdue for mammogram ? ?Chronic disease f/u and/or acute problem visit: (deemed necessary to be done in addition to the wellness visit): ?Type 2 diabetes on Ozempic, Lantus, metformin and tolerating well.  Due for recheck.  Denies hypoglycemia.  Overdue for eye exam.  No history of retinopathy.  No neuropathy symptoms.  Mild chronic kidney insufficiency.   ?Hypertension: She reports home blood pressures have been very normal.  Today is mildly elevated in the office but improved on recheck.  No chest pain or shortness of breath.  She is on metoprolol XL 25 daily, losartan 100 daily and amlodipine 10.  No lower extremity edema.  No palpitations ?Hyperlipidemia on statin which is well-tolerated.  Fasting today for recheck.  Goal LDL less than 70 ?Hypothyroidism on low-dose replacement therapy.  Energy levels are good. ?History of bladder cancer ?Iron deficiency anemia noted in September of last year.  Never followed up with stool tests.  Refuses colonoscopy.  No abdominal pain.  No melena.  No bright red blood per rectum.  Does have GERD on chronic PPI.  She says this is well controlled.  Not currently taking iron.  Mildly constipated since recent  bronchitis. ?Complains of osteoarthritis in the hands.  Has 1 tender joint in her pointer finger ? ?Assessment  ?1. Annual physical exam   ?2. Type 2 diabetes mellitus with insulin therapy ([place])   ?3. Hyperlipidemia associated with type 2 diabetes mellitus ([place])   ?4. Hypertension associated with diabetes ([place])   ?5. Acquired hypothyroidism   ?6. History of bladder cancer   ?7. Major depressive disorder, recurrent episode, moderate ([place])   ?8. Iron deficiency anemia, unspecified iron deficiency anemia type   ?9. Gastroesophageal reflux disease   ?10. Screening for colorectal cancer patient refuses   ?11. Primary osteoarthritis of both hands   ? ?  ?Plan  ?Female Wellness Visit: ?Age appropriate Health Maintenance and Prevention measures were discussed with patient. Included topics are cancer screening recommendations, ways to keep healthy (see AVS) including dietary and exercise recommendations, regular eye and dental care, use of seat belts, and avoidance of moderate alcohol use and tobacco use.  Needs mammogram.  Refuses colon cancer screenings. ?BMI: discussed patient's BMI and encouraged positive lifestyle modifications to help get to or maintain a target BMI. ?HM needs and immunizations were addressed and ordered. See below for orders. See HM and immunization section for updates.  Defers Shingrix ?Routine labs and screening tests ordered including cmp, cbc and lipids where appropriate. ?Discussed recommendations regarding Vit D and calcium supplementation (see AVS) ? ?Chronic disease management visit and/or acute problem visit: ?Diabetic control: Has been well controlled.  We will recheck levels  today.  Recommend eye exam.  Screen for nephropathy ?Hypertension with good control on metoprolol XR 25 daily amlodipine 10 daily and losartan 100 daily.  Monitoring renal function and electrolytes ?Hyperlipidemia on statin: Recheck liver function test and cholesterol panel.  Tolerating well, rosuvastatin 10 mg  nightly ?Recheck thyroid levels on 25 mcg levothyroxine.  Clinically euthyroid ?GERD on chronic omeprazole 20 daily refilled ?Stable mood on Lexapro refilled ?IDA: Recheck levels today and stool cards.  Education counseling done.  If positive, will push for colonoscopy.  Patient currently refuses ? ?Follow up: 6 months to recheck diabetes and blood pressure ?Orders Placed This Encounter  ?Procedures  ? CBC with Differential/Platelet  ? Comprehensive metabolic panel  ? Iron, TIBC and Ferritin Panel  ? Lipid panel  ? TSH  ? Hemoglobin A1c  ? Microalbumin / creatinine urine ratio  ? POC Hemoccult Bld/Stl (3-Cd Home Screen)  ? ?Meds ordered this encounter  ?Medications  ? losartan (COZAAR) 100 MG tablet  ?  Sig: Take 1 tablet (100 mg total) by mouth daily.  ?  Dispense:  90 tablet  ?  Refill:  3  ? rosuvastatin (CRESTOR) 10 MG tablet  ?  Sig: Take 1 tablet (10 mg total) by mouth daily.  ?  Dispense:  90 tablet  ?  Refill:  3  ? omeprazole (PRILOSEC) 20 MG capsule  ?  Sig: Take 1 capsule (20 mg total) by mouth daily.  ?  Dispense:  90 capsule  ?  Refill:  3  ? metFORMIN (GLUCOPHAGE) 1000 MG tablet  ?  Sig: Take 1 tablet (1,000 mg total) by mouth 2 (two) times daily with a meal.  ?  Dispense:  180 tablet  ?  Refill:  3  ? ?  ? ?Body mass index is 28.8 kg/m?. ?Wt Readings from Last 3 Encounters:  ?02/06/22 162 lb 9.6 oz (73.8 kg)  ?01/31/22 165 lb 6.4 oz (75 kg)  ?11/14/21 158 lb 2 oz (71.7 kg)  ? ? ? ?Patient Active Problem List  ? Diagnosis Date Noted  ? History of bladder cancer 10/14/2019  ?  Priority: High  ? Acquired hypothyroidism 05/06/2019  ?  Priority: High  ? Type 2 diabetes mellitus with insulin therapy ([place]) 05/06/2019  ?  Priority: High  ?  Diagnosed in 1996 ?  ? Hyperlipidemia associated with type 2 diabetes mellitus ([place]) 05/06/2019  ?  Priority: High  ? Hypertension associated with diabetes ([place]) 05/06/2019  ?  Priority: High  ? PTSD (post-traumatic stress disorder) 11/10/2018  ?  Priority: High  ?   Followed by dr. Clovis Pu. On xanax.  ?  ? Major depressive disorder, recurrent episode, moderate ([place]) 02/06/2022  ?  Priority: Medium   ? Subclinical hypothyroidism 11/08/2021  ?  Priority: Medium   ? Osteopenia 05/05/2020  ?  Priority: Medium   ?  Dexa: 09/2018. Tscore of -1.7. repeat 3 years.  ?  ? Gastroesophageal reflux disease 05/06/2019  ?  Priority: Medium   ? Moderate persistent asthma without complication 54/27/0623  ?  Priority: Medium   ? Chronic bilateral low back pain without sciatica 05/06/2019  ?  Priority: Medium   ? Primary osteoarthritis of both hands 10/11/2015  ?  Priority: Medium   ? Morton's neuroma of both feet 08/10/2019  ?  Priority: Low  ? Seasonal allergies 05/06/2019  ?  Priority: Low  ? Bilateral carpal tunnel syndrome 10/11/2015  ?  Priority: Low  ? Trigger finger of both hands 10/11/2015  ?  Priority: Low  ? Screening for colorectal cancer patient refuses 02/06/2022  ?  Counseling done. Has never had colonoscopy.  ?  ? ?Health Maintenance  ?Topic Date Due  ? OPHTHALMOLOGY EXAM  05/17/2021  ? Zoster Vaccines- Shingrix (1 of 2) 05/03/2022 (Originally 09/17/1965)  ? TETANUS/TDAP  02/01/2023 (Originally 09/17/1965)  ? FOOT EXAM  02/08/2022  ? HEMOGLOBIN A1C  05/09/2022  ? DEXA SCAN  10/15/2023  ? Pneumonia Vaccine 17+ Years old  Completed  ? INFLUENZA VACCINE  Completed  ? Hepatitis C Screening  Completed  ? HPV VACCINES  Aged Out  ? COVID-19 Vaccine  Discontinued  ? ?Immunization History  ?Administered Date(s) Administered  ? Fluad Quad(high Dose 65+) 08/12/2019, 08/10/2020, 08/14/2021  ? PFIZER(Purple Top)SARS-COV-2 Vaccination 08/17/2020, 09/09/2020  ? Pneumococcal Conjugate-13 07/09/2019  ? Pneumococcal Polysaccharide-23 08/10/2020  ? ?We updated and reviewed the patient's past history in detail and it is documented below. ?Allergies: ?Patient is allergic to celebrex [celecoxib], glipizide, penicillins, and sulfa antibiotics. ?Past Medical History ?Patient  has a past medical history of  Bladder cancer ([place]) (first dx 2012), Chronic constipation, Closed fracture of left distal radius (08/30/2016), Full dentures, GERD (gastroesophageal reflux disease), Hyperlipidemia, Hypertension, Mild intermittent asth

## 2022-02-06 NOTE — Patient Instructions (Signed)
Please return in 6 months to recheck blood pressure and diabetes ? ?I will release your lab results to you on your MyChart account with further instructions. You may see the results before I do, but when I review them I will send you a message with my report or have my assistant call you if things need to be discussed. Please reply to my message with any questions. Thank you!  ? ?Please send in the stool cards to check for blood in the stool.  ? ?If you have any questions or concerns, please don't hesitate to send me a message via MyChart or call the office at [phone]. Thank you for visiting with [place] today! It's our pleasure caring for you.  ?

## 2022-02-07 LAB — IRON,TIBC AND FERRITIN PANEL
%SAT: 11 % (calc) — ABNORMAL LOW (ref 16–45)
Ferritin: 43 ng/mL (ref 16–288)
Iron: 39 ug/dL — ABNORMAL LOW (ref 45–160)
TIBC: 367 mcg/dL (calc) (ref 250–450)

## 2022-02-09 ENCOUNTER — Other Ambulatory Visit: Payer: Self-pay | Admitting: Family Medicine

## 2022-02-20 ENCOUNTER — Telehealth: Payer: Self-pay | Admitting: Family Medicine

## 2022-02-20 NOTE — Telephone Encounter (Signed)
Spoke with patient she declined AWV do not call  

## 2022-02-21 DIAGNOSIS — H5203 Hypermetropia, bilateral: Secondary | ICD-10-CM | POA: Diagnosis not present

## 2022-02-21 DIAGNOSIS — H52223 Regular astigmatism, bilateral: Secondary | ICD-10-CM | POA: Diagnosis not present

## 2022-02-21 DIAGNOSIS — H0102A Squamous blepharitis right eye, upper and lower eyelids: Secondary | ICD-10-CM | POA: Diagnosis not present

## 2022-02-21 DIAGNOSIS — H524 Presbyopia: Secondary | ICD-10-CM | POA: Diagnosis not present

## 2022-02-21 DIAGNOSIS — E119 Type 2 diabetes mellitus without complications: Secondary | ICD-10-CM | POA: Diagnosis not present

## 2022-02-21 DIAGNOSIS — H35033 Hypertensive retinopathy, bilateral: Secondary | ICD-10-CM | POA: Diagnosis not present

## 2022-02-21 DIAGNOSIS — H0102B Squamous blepharitis left eye, upper and lower eyelids: Secondary | ICD-10-CM | POA: Diagnosis not present

## 2022-02-21 DIAGNOSIS — Z961 Presence of intraocular lens: Secondary | ICD-10-CM | POA: Diagnosis not present

## 2022-02-21 DIAGNOSIS — H04123 Dry eye syndrome of bilateral lacrimal glands: Secondary | ICD-10-CM | POA: Diagnosis not present

## 2022-02-21 DIAGNOSIS — H1045 Other chronic allergic conjunctivitis: Secondary | ICD-10-CM | POA: Diagnosis not present

## 2022-02-21 DIAGNOSIS — Z794 Long term (current) use of insulin: Secondary | ICD-10-CM | POA: Diagnosis not present

## 2022-02-21 LAB — HM DIABETES EYE EXAM

## 2022-02-26 ENCOUNTER — Encounter: Payer: Self-pay | Admitting: Family Medicine

## 2022-02-26 ENCOUNTER — Ambulatory Visit (INDEPENDENT_AMBULATORY_CARE_PROVIDER_SITE_OTHER): Payer: Medicare Other | Admitting: Family Medicine

## 2022-02-26 VITALS — BP 139/79 | HR 102 | Temp 98.3°F | Ht 63.0 in | Wt 164.6 lb

## 2022-02-26 DIAGNOSIS — B86 Scabies: Secondary | ICD-10-CM

## 2022-02-26 DIAGNOSIS — H5213 Myopia, bilateral: Secondary | ICD-10-CM | POA: Diagnosis not present

## 2022-02-26 MED ORDER — PERMETHRIN 5 % EX CREA: 1.0000 "application " | TOPICAL_CREAM | Freq: Once | CUTANEOUS | 1 refills | Status: AC

## 2022-02-26 MED ORDER — HYDROXYZINE PAMOATE 25 MG PO CAPS: 25.0000 mg | ORAL_CAPSULE | Freq: Three times a day (TID) | ORAL | 0 refills | Status: DC | PRN

## 2022-02-26 NOTE — Progress Notes (Signed)
? ?Subjective  ?CC:  ?Chief Complaint  ?Patient presents with  ? Rash  ?  Started almost 2 weeks ago, spreading on back and legs. She states that it may be from a Tdap vaccine she recently had. She has used Eucerin, but has not subsided.  ? ? ?HPI: Teresa Rice is a 76 y.o. female who presents to the office today to address the problems listed above in the chief complaint. ?Rash, diffuse, extremely itchy.  Has scratched most lesions to scabs.  No associated symptoms. ?Assessment  ?1. Scabies   ? ?  ?Plan  ?Presumed scabies: Education given.  Elimite.  Hydroxyzine.  Follow-up with unimproved. ? ?Follow up:   ?08/09/2022 ? ?No orders of the defined types were placed in this encounter. ? ?Meds ordered this encounter  ?Medications  ? permethrin (ELIMITE) 5 % cream  ?  Sig: Apply 1 application. topically once for 1 dose. May repeat in one week if needed.  ?  Dispense:  60 g  ?  Refill:  1  ? hydrOXYzine (VISTARIL) 25 MG capsule  ?  Sig: Take 1 capsule (25 mg total) by mouth every 8 (eight) hours as needed for itching.  ?  Dispense:  30 capsule  ?  Refill:  0  ? ?  ? ?I reviewed the patients updated PMH, FH, and SocHx.  ?  ?Patient Active Problem List  ? Diagnosis Date Noted  ? History of bladder cancer 10/14/2019  ?  Priority: High  ? Acquired hypothyroidism 05/06/2019  ?  Priority: High  ? Type 2 diabetes mellitus with insulin therapy ([place]) 05/06/2019  ?  Priority: High  ? Hyperlipidemia associated with type 2 diabetes mellitus ([place]) 05/06/2019  ?  Priority: High  ? Hypertension associated with diabetes ([place]) 05/06/2019  ?  Priority: High  ? PTSD (post-traumatic stress disorder) 11/10/2018  ?  Priority: High  ? Major depressive disorder, recurrent episode, moderate ([place]) 02/06/2022  ?  Priority: Medium   ? Subclinical hypothyroidism 11/08/2021  ?  Priority: Medium   ? Osteopenia 05/05/2020  ?  Priority: Medium   ? Gastroesophageal reflux disease 05/06/2019  ?  Priority: Medium   ? Moderate persistent asthma without  complication 83/41/9622  ?  Priority: Medium   ? Chronic bilateral low back pain without sciatica 05/06/2019  ?  Priority: Medium   ? Primary osteoarthritis of both hands 10/11/2015  ?  Priority: Medium   ? Morton's neuroma of both feet 08/10/2019  ?  Priority: Low  ? Seasonal allergies 05/06/2019  ?  Priority: Low  ? Bilateral carpal tunnel syndrome 10/11/2015  ?  Priority: Low  ? Trigger finger of both hands 10/11/2015  ?  Priority: Low  ? Screening for colorectal cancer patient refuses 02/06/2022  ? ?Current Meds  ?Medication Sig  ? Accu-Chek Softclix Lancets lancets   ? albuterol (VENTOLIN HFA) 108 (90 Base) MCG/ACT inhaler Inhale 2 puffs into the lungs every 6 (six) hours as needed for wheezing or shortness of breath.  ? ALPRAZolam (XANAX) 1 MG tablet 1/2 tablet TID and 3/4 tablet at night  ? amLODipine (NORVASC) 10 MG tablet Take 1 tablet (10 mg total) by mouth daily.  ? azelastine (ASTELIN) 0.1 % nasal spray Place 1 spray into both nostrils in the morning. Use in each nostril as directed  ? B-D UF III MINI PEN NEEDLES 31G X 5 MM MISC USE AS DIRECTED WITH LANTUS INJECTION  ? blood glucose meter kit and supplies Dispense based on  patient and insurance preference. Use up to four times daily as directed. (FOR ICD-10 E10.9, E11.9).  ? cholecalciferol (VITAMIN D3) 25 MCG (1000 UNIT) tablet Take 1,000 Units by mouth daily.  ? escitalopram (LEXAPRO) 10 MG tablet Take 1 tablet (10 mg total) by mouth at bedtime.  ? fenofibrate 160 MG tablet Take 1 tablet by mouth once daily  ? fluticasone (FLONASE) 50 MCG/ACT nasal spray Place 2 sprays into both nostrils daily.  ? glucose blood test strip Use as instructed  ? hydrOXYzine (VISTARIL) 25 MG capsule Take 1 capsule (25 mg total) by mouth every 8 (eight) hours as needed for itching.  ? Insulin Pen Needle (PEN NEEDLES) 32G X 5 MM MISC Please use new needle after very Lantus injection.  ? LANTUS SOLOSTAR 100 UNIT/ML Solostar Pen INJECT 15 UNITS SUBCUTANEOUSLY ONCE DAILY AT  NIGHT  ? levothyroxine (SYNTHROID) 25 MCG tablet Take one pill daily for thyroid  ? losartan (COZAAR) 100 MG tablet Take 1 tablet (100 mg total) by mouth daily.  ? magnesium oxide (MAG-OX) 400 MG tablet Take 400 mg by mouth 2 (two) times daily.  ? metFORMIN (GLUCOPHAGE) 1000 MG tablet Take 1 tablet (1,000 mg total) by mouth 2 (two) times daily with a meal.  ? metoprolol succinate (TOPROL-XL) 25 MG 24 hr tablet Take 1 tablet (25 mg total) by mouth daily.  ? montelukast (SINGULAIR) 10 MG tablet Take 1 tablet (10 mg total) by mouth at bedtime.  ? Multiple Vitamin (MULTIVITAMIN WITH MINERALS) TABS tablet Take 1 tablet by mouth daily.  ? Omega 3 1000 MG CAPS Take 1,000 mg by mouth daily.  ? omeprazole (PRILOSEC) 20 MG capsule Take 1 capsule (20 mg total) by mouth daily.  ? permethrin (ELIMITE) 5 % cream Apply 1 application. topically once for 1 dose. May repeat in one week if needed.  ? Polyethylene Glycol 3350 (MIRALAX PO) Take by mouth every evening.  ? rosuvastatin (CRESTOR) 10 MG tablet Take 1 tablet (10 mg total) by mouth daily.  ? Semaglutide, 1 MG/DOSE, 4 MG/3ML SOPN Inject 1 mg as directed once a week.  ? SYMBICORT 160-4.5 MCG/ACT inhaler Inhale 2 puffs into the lungs 2 (two) times daily.  ? traMADol (ULTRAM) 50 MG tablet Take 1 tablet by mouth every 6 (six) hours as needed.  ? VITAMIN E PO Take by mouth.  ? ? ?Allergies: ?Patient is allergic to celebrex [celecoxib], glipizide, penicillins, and sulfa antibiotics. ?Family History: ?Patient family history includes AAA (abdominal aortic aneurysm) in her sister; Cancer in her brother and father; Diabetes in her brother; Early death in her mother. ?Social History:  ?Patient  reports that she quit smoking about 27 years ago. Her smoking use included cigarettes. She has never used smokeless tobacco. She reports current alcohol use. She reports that she does not use drugs. ? ?Review of Systems: ?Constitutional: Negative for fever malaise or anorexia ?Cardiovascular:  negative for chest pain ?Respiratory: negative for SOB or persistent cough ?Gastrointestinal: negative for abdominal pain ? ?Objective  ?Vitals: BP 139/79   Pulse (!) 102   Temp 98.3 ?F (36.8 ?C) (Temporal)   Ht 5' 3"  (1.6 m)   Wt 164 lb 9.6 oz (74.7 kg)   LMP  (LMP Unknown)   SpO2 96%   BMI 29.16 kg/m?  ?General: no acute distress , A&Ox3 ?Skin: Diffuse papular rash, excoriated, scabbed lesions.  Some burrows present. ? ? ? ?Commons side effects, risks, benefits, and alternatives for medications and treatment plan prescribed today were discussed, and  the patient expressed understanding of the given instructions. Patient is instructed to call or message via MyChart if he/she has any questions or concerns regarding our treatment plan. No barriers to understanding were identified. We discussed Red Flag symptoms and signs in detail. Patient expressed understanding regarding what to do in case of urgent or emergency type symptoms.  ?Medication list was reconciled, printed and provided to the patient in AVS. Patient instructions and summary information was reviewed with the patient as documented in the AVS. ?This note was prepared with assistance of Systems analyst. Occasional wrong-word or sound-a-like substitutions may have occurred due to the inherent limitations of voice recognition software ? ?This visit occurred during the SARS-CoV-2 public health emergency.  Safety protocols were in place, including screening questions prior to the visit, additional usage of staff PPE, and extensive cleaning of exam room while observing appropriate contact time as indicated for disinfecting solutions.  ? ?

## 2022-02-26 NOTE — Patient Instructions (Signed)
Please follow up if symptoms do not improve or as needed.   ? ?Scabies, Adult ?Scabies is a skin condition that happens when very small insects called mites get under the skin (infestation). This causes a rash and severe itchiness. Scabies is contagious, which means it can spread from person to person. If you get scabies, it is common for others in your household to get scabies too. ?With proper treatment, symptoms usually go away in 2-4 weeks. Scabies usually does not cause lasting problems. ?What are the causes? ?This condition is caused by tiny mites (Sarcoptes scabiei, or human itch mites) that can only be seen with a microscope. The mites get into the top layer of skin and lay eggs. Scabies can spread from person to person through: ?Close contact with a person who has scabies. ?Sharing or having contact with infested items, such as towels, bedding, or clothing. ?What increases the risk? ?The following factors may make you more likely to develop this condition: ?Living in a nursing home or other extended care facility. ?Having sexual contact with a partner who has scabies. ?Caring for others who are at increased risk for scabies. ?What are the signs or symptoms? ?Symptoms of this condition include: ?Severe itchiness. This is often worse at night. ?A rash that includes tiny red bumps or blisters. The rash commonly occurs on the hands, wrists, elbows, armpits, chest, waist, groin, or buttocks. The bumps may form a line (burrow) in some areas. ?Skin irritation. This can include scaly patches or sores. ?How is this diagnosed? ?This condition may be diagnosed based on: ?A physical exam of the skin. ?A skin test. Your health care provider may take a sample of your affected skin (skin scraping) and have it examined under a microscope for signs of mites. ?How is this treated? ?This condition may be treated with: ?Medicated cream or lotion that kills the mites. This is spread on the entire body and left on for several  hours. Usually, one treatment with medicated cream or lotion is enough to kill all the mites. In severe cases, the treatment may need to be repeated. ?Medicated cream that relieves itching. ?Medicines taken by mouth (orally) that: ?Relieve itching. ?Reduce the swelling and redness. ?Kill the mites. This treatment may be done in severe cases. ?Follow these instructions at home: ?Medicines ?Take or apply over-the-counter and prescription medicines only as told by your health care provider. ?Apply medicated cream or lotion as told by your health care provider. ?Do not wash off the medicated cream or lotion until the necessary amount of time has passed. ?Skin care ?Avoid scratching the affected areas of your skin. ?Keep your fingernails closely trimmed to reduce injury from scratching. ?Take cool baths or apply cool washcloths to your skin to help reduce itching. ?General instructions ?Clean all items that you had contact with during the 3 days before diagnosis. This includes bedding, clothing, towels, and furniture. Do this on the same day that you start treatment. ?Dry-clean items, or use hot water to wash items. Dry items on the hot dry cycle. ?Place items that cannot be washed into closed, airtight plastic bags for at least 3 days. The mites cannot live for more than 3 days away from human skin. ?Vacuum furniture and mattresses that you use. ?Make sure that other people who may have been infested are examined by a health care provider. These include members of your household and anyone who may have had contact with infested items. ?Keep all follow-up visits. This is important. ?Where  to find more information ?Centers for Disease Control and Prevention: http://www.wolf.info/ ?Contact a health care provider if: ?You have itching that does not go away after 4 weeks of treatment. ?You continue to develop new bumps or burrows. ?You have redness, swelling, or pain in your rash area after treatment. ?You have fluid, blood, or pus  coming from your rash. ?Summary ?Scabies is a skin condition that causes a rash and severe itchiness. ?This condition is caused by tiny mites that get into the top layer of the skin and lay eggs. ?Scabies can spread from person to person. ?Follow treatments as recommended by your health care provider. ?Clean all items that you recently had contact with. ?This information is not intended to replace advice given to you by your health care provider. Make sure you discuss any questions you have with your health care provider. ?Document Revised: 03/11/2020 Document Reviewed: 03/11/2020 ?Elsevier Patient Education ? [place]. ? ?

## 2022-03-01 ENCOUNTER — Encounter: Payer: Self-pay | Admitting: Family Medicine

## 2022-03-02 DIAGNOSIS — Z79899 Other long term (current) drug therapy: Secondary | ICD-10-CM | POA: Diagnosis not present

## 2022-03-06 DIAGNOSIS — E1169 Type 2 diabetes mellitus with other specified complication: Secondary | ICD-10-CM | POA: Diagnosis not present

## 2022-03-06 DIAGNOSIS — H9190 Unspecified hearing loss, unspecified ear: Secondary | ICD-10-CM | POA: Diagnosis not present

## 2022-03-06 DIAGNOSIS — E1142 Type 2 diabetes mellitus with diabetic polyneuropathy: Secondary | ICD-10-CM | POA: Diagnosis not present

## 2022-03-06 DIAGNOSIS — K219 Gastro-esophageal reflux disease without esophagitis: Secondary | ICD-10-CM | POA: Diagnosis not present

## 2022-03-06 DIAGNOSIS — B354 Tinea corporis: Secondary | ICD-10-CM | POA: Diagnosis not present

## 2022-03-06 DIAGNOSIS — J4530 Mild persistent asthma, uncomplicated: Secondary | ICD-10-CM | POA: Diagnosis not present

## 2022-03-06 DIAGNOSIS — L309 Dermatitis, unspecified: Secondary | ICD-10-CM | POA: Diagnosis not present

## 2022-03-06 DIAGNOSIS — I10 Essential (primary) hypertension: Secondary | ICD-10-CM | POA: Diagnosis not present

## 2022-03-06 DIAGNOSIS — E039 Hypothyroidism, unspecified: Secondary | ICD-10-CM | POA: Diagnosis not present

## 2022-03-06 DIAGNOSIS — Z8551 Personal history of malignant neoplasm of bladder: Secondary | ICD-10-CM | POA: Diagnosis not present

## 2022-03-20 DIAGNOSIS — K219 Gastro-esophageal reflux disease without esophagitis: Secondary | ICD-10-CM | POA: Diagnosis not present

## 2022-03-20 DIAGNOSIS — Z8551 Personal history of malignant neoplasm of bladder: Secondary | ICD-10-CM | POA: Diagnosis not present

## 2022-03-20 DIAGNOSIS — L309 Dermatitis, unspecified: Secondary | ICD-10-CM | POA: Diagnosis not present

## 2022-03-20 DIAGNOSIS — B354 Tinea corporis: Secondary | ICD-10-CM | POA: Diagnosis not present

## 2022-03-20 DIAGNOSIS — H9190 Unspecified hearing loss, unspecified ear: Secondary | ICD-10-CM | POA: Diagnosis not present

## 2022-03-20 DIAGNOSIS — E039 Hypothyroidism, unspecified: Secondary | ICD-10-CM | POA: Diagnosis not present

## 2022-03-20 DIAGNOSIS — E1169 Type 2 diabetes mellitus with other specified complication: Secondary | ICD-10-CM | POA: Diagnosis not present

## 2022-03-20 DIAGNOSIS — I10 Essential (primary) hypertension: Secondary | ICD-10-CM | POA: Diagnosis not present

## 2022-03-20 DIAGNOSIS — E1142 Type 2 diabetes mellitus with diabetic polyneuropathy: Secondary | ICD-10-CM | POA: Diagnosis not present

## 2022-03-20 DIAGNOSIS — J4530 Mild persistent asthma, uncomplicated: Secondary | ICD-10-CM | POA: Diagnosis not present

## 2022-04-10 ENCOUNTER — Ambulatory Visit (INDEPENDENT_AMBULATORY_CARE_PROVIDER_SITE_OTHER): Payer: Medicare Other | Admitting: Psychiatry

## 2022-04-10 ENCOUNTER — Encounter: Payer: Self-pay | Admitting: Psychiatry

## 2022-04-10 DIAGNOSIS — F5105 Insomnia due to other mental disorder: Secondary | ICD-10-CM | POA: Diagnosis not present

## 2022-04-10 DIAGNOSIS — F4310 Post-traumatic stress disorder, unspecified: Secondary | ICD-10-CM

## 2022-04-10 DIAGNOSIS — F331 Major depressive disorder, recurrent, moderate: Secondary | ICD-10-CM

## 2022-04-10 DIAGNOSIS — F4001 Agoraphobia with panic disorder: Secondary | ICD-10-CM

## 2022-04-10 MED ORDER — VILAZODONE HCL 20 MG PO TABS: 20.0000 mg | ORAL_TABLET | Freq: Every day | ORAL | 1 refills | Status: DC

## 2022-04-10 MED ORDER — ALPRAZOLAM 1 MG PO TABS: ORAL_TABLET | ORAL | 5 refills | Status: DC

## 2022-04-10 NOTE — Progress Notes (Signed)
Teresa Rice ?[id number] ?02/02/1946 ?76 y.o. ? ?Subjective:  ? ?Patient ID:  Teresa Rice is a 76 y.o. (DOB 07-05-1946) female. ? ?Chief Complaint:  ?Chief Complaint  ?Patient presents with  ? Follow-up  ? Medication Problem  ? Depression  ? Anxiety  ? ? ?HPI ?Teresa Rice presents to the office today for follow-up of anxiety.   ? ?seen Dec 2020 without med changes though she had previously stopped SSRI AMA.  MD had concerns over LT panic, ? ?05/25/20 appt with the following noted: ?No vaccine. ?[place] out of bed.  Hurt herself.  Needs eye surgery and worry about it and DM.  Fights off depression and anxiety.  Feels stressed.   No caffeine.  Not aware if something bothering her.  No full panic.  Not drowsy daytime. .  Also falling asleep on the couch though she doesn't want to do it.  Chest gets tight with anxiety. ?No weight loss off Paxil but no weight gain either.  Overall is not worse still and pleased with that. 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 ?Still doing well with the Xanax. Usually 1/2 mg TID and 1 mg HS.  ?Plan no med changes ? ?11/24/20 appt with following noted: ?Some issues with D about her past and a son taken from her at 55 yo.  Recent conflict, 2 days before Christmas,  through me for a loop and said things she regrets to her D out of anger.   ?The 80 yo son will not listen to her or let her tell her side of the story.  Very confusing to me.  It is a hard thing to cope with..  She tried to get court records about it but was told it was a closed case. ?Good news November 14 was baptized.  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  [place].  Will be another great grandmother will be a little boy.   Due 03/17/20.   ?Can awaken with chest pressure in the morning but it stops short of full panic.  Did not have it this morning.    ?Plan no med changes ? ?01/18/2021 phone call complaining of very depression, staying in bed until 11  AM not wanting to drive, and cannot shut her mind off and asking about an antidepressant.  She had previously been on paroxetine but wanted to stop it.  Therefore we started duloxetine 30 mg 1 daily for 1 week and then 2 daily ? ?03/08/2021 appointment with the following noted: ?Never started duloxetine bc read about SE of tremor and never took it. ?The got RX for paroxetine to restart and Had to stop it bc shaking and felt sick going into 3rd week on it.  Tremor too bad. ?Depression triggered by conversation between estranged son (stolen from me)  and her daughter.  Really upset her.   Now some days more depressed and some days she feels and functions better.  This problem sticks in her head and bothers her.  Thinks son said he hopes she dies.  Haven't talked to son in 48 years. ?More bad days than good.   ?Plan rec low dose 10 Lexapro ? ?05/08/2021 appt noted: ?Taken Lexapro.  Calmer and don't feel upset.  I feel good.  No SE with the med.  Took a little while to help.  Doesn't talk about son.  That was the root of my problem. ?Patient reports stable mood and denies depressed or  irritable moods.  Patient denies any recent difficulty with anxiety.  Patient denies difficulty with sleep initiation or maintenance. Denies appetite disturbance.  Patient reports that energy and motivation have been good.  Patient denies any difficulty with concentration.  Patient denies any suicidal ideation. ?Loves 2 great grand-sons Teresa Rice 2, Teresa Rice 9 weeks. ?Got Covid since here. Cough resolved but still a little tired. ? ?10/11/21 appt noted: ?Stopped Lexapro bc just wanted to see if I'd be OK and was more irritable.  Then restarted it last month. Sleep a long time.  She is fearful about cuitting it back  ?Doesn't feels she can do without Xanax or reduce it.  Not markely depressed. ?Tolerating meds without SE.  No SI ? ?04/10/22 APPT noted: ?Stopped Lexapro AMA DT tremor hands.  Were so bad it was hard to hold a cup of coffee.  This  resolved off the med.  Feels somewhat more irritable off of it.  A little  more depressed off it and is more nervous. ?Sleep good lately. ?Still alprazolam same dose ?Thinks paroxetine caused shakes too.  ? ?New PCP Teresa Rice, [place], geriatric doc ? ?Past Psychiatric Medication Trials:  Paxil 60, sertraline forgetfulness, Lexapro 10 tremor but benefit ? trazodone, ?She has been under our care since 1998 and has been on the same dosage of Xanax the whole time and most of the time took paroxetine 60 ? ?D on psych med: shakes too on zoloft and others ? ?Review of Systems:  ?Review of Systems  ?HENT:  Positive for hearing loss and voice change.   ?Respiratory:  Positive for shortness of breath.   ?Cardiovascular:  Negative for palpitations.  ?Musculoskeletal:  Positive for back pain.  ?Neurological:  Negative for tremors and weakness.  ?Psychiatric/Behavioral:  Positive for dysphoric mood. Negative for agitation, behavioral problems, confusion, decreased concentration, hallucinations, self-injury, sleep disturbance and suicidal ideas. The patient is nervous/anxious. The patient is not hyperactive.   ? ?Medications: I have reviewed the patient's current medications. ? ?Current Outpatient Medications  ?Medication Sig Dispense Refill  ? Accu-Chek Softclix Lancets lancets     ? albuterol (VENTOLIN HFA) 108 (90 Base) MCG/ACT inhaler Inhale 2 puffs into the lungs every 6 (six) hours as needed for wheezing or shortness of breath. 8 g 1  ? amLODipine (NORVASC) 10 MG tablet Take 1 tablet (10 mg total) by mouth daily. 90 tablet 3  ? azelastine (ASTELIN) 0.1 % nasal spray Place 1 spray into both nostrils in the morning. Use in each nostril as directed 30 mL 2  ? B-D UF III MINI PEN NEEDLES 31G X 5 MM MISC USE AS DIRECTED WITH LANTUS INJECTION 100 each 0  ? blood glucose meter kit and supplies Dispense based on patient and insurance preference. Use up to four times daily as directed. (FOR ICD-10 E10.9, E11.9). 1 each 0  ?  cholecalciferol (VITAMIN D3) 25 MCG (1000 UNIT) tablet Take 1,000 Units by mouth daily.    ? fenofibrate 160 MG tablet Take 1 tablet by mouth once daily 90 tablet 0  ? fluticasone (FLONASE) 50 MCG/ACT nasal spray Place 2 sprays into both nostrils daily. 16 g 6  ? glucose blood test strip Use as instructed 100 each 12  ? hydrOXYzine (VISTARIL) 25 MG capsule Take 1 capsule (25 mg total) by mouth every 8 (eight) hours as needed for itching. 30 capsule 0  ? Insulin Pen Needle (PEN NEEDLES) 32G X 5 MM MISC Please use new needle after very Lantus injection. 100  each 3  ? LANTUS SOLOSTAR 100 UNIT/ML Solostar Pen INJECT 15 UNITS SUBCUTANEOUSLY ONCE DAILY AT NIGHT 15 mL 0  ? levothyroxine (SYNTHROID) 25 MCG tablet Take one pill daily for thyroid 90 tablet 3  ? losartan (COZAAR) 100 MG tablet Take 1 tablet (100 mg total) by mouth daily. 90 tablet 3  ? magnesium oxide (MAG-OX) 400 MG tablet Take 400 mg by mouth 2 (two) times daily.    ? metFORMIN (GLUCOPHAGE) 1000 MG tablet Take 1 tablet (1,000 mg total) by mouth 2 (two) times daily with a meal. 180 tablet 3  ? metoprolol succinate (TOPROL-XL) 25 MG 24 hr tablet Take 1 tablet (25 mg total) by mouth daily. 90 tablet 3  ? montelukast (SINGULAIR) 10 MG tablet Take 1 tablet (10 mg total) by mouth at bedtime. 90 tablet 1  ? Multiple Vitamin (MULTIVITAMIN WITH MINERALS) TABS tablet Take 1 tablet by mouth daily.    ? Omega 3 1000 MG CAPS Take 1,000 mg by mouth daily.    ? omeprazole (PRILOSEC) 20 MG capsule Take 1 capsule (20 mg total) by mouth daily. 90 capsule 3  ? Polyethylene Glycol 3350 (MIRALAX PO) Take by mouth every evening.    ? rosuvastatin (CRESTOR) 10 MG tablet Take 1 tablet (10 mg total) by mouth daily. 90 tablet 3  ? Semaglutide, 1 MG/DOSE, 4 MG/3ML SOPN Inject 1 mg as directed once a week. 9 mL 3  ? SYMBICORT 160-4.5 MCG/ACT inhaler Inhale 2 puffs into the lungs 2 (two) times daily. 11 g 11  ? VITAMIN E PO Take by mouth.    ? ALPRAZolam (XANAX) 1 MG tablet 1/2 tablet  TID and 3/4 tablet at night 71 tablet 5  ? escitalopram (LEXAPRO) 10 MG tablet Take 1 tablet (10 mg total) by mouth at bedtime. (Patient not taking: Reported on 04/10/2022) 90 tablet 1  ? traMADol (ULTRAM) 50 MG t

## 2022-05-07 ENCOUNTER — Ambulatory Visit: Payer: Medicare Other | Admitting: Podiatry

## 2022-05-09 ENCOUNTER — Other Ambulatory Visit: Payer: Self-pay | Admitting: Family Medicine

## 2022-05-14 ENCOUNTER — Ambulatory Visit: Payer: Medicare Other

## 2022-05-14 ENCOUNTER — Ambulatory Visit (INDEPENDENT_AMBULATORY_CARE_PROVIDER_SITE_OTHER): Payer: Medicare Other | Admitting: Podiatry

## 2022-05-14 ENCOUNTER — Ambulatory Visit (INDEPENDENT_AMBULATORY_CARE_PROVIDER_SITE_OTHER): Payer: Medicare Other

## 2022-05-14 DIAGNOSIS — R609 Edema, unspecified: Secondary | ICD-10-CM | POA: Diagnosis not present

## 2022-05-14 DIAGNOSIS — E1149 Type 2 diabetes mellitus with other diabetic neurological complication: Secondary | ICD-10-CM | POA: Diagnosis not present

## 2022-05-14 DIAGNOSIS — M7742 Metatarsalgia, left foot: Secondary | ICD-10-CM | POA: Diagnosis not present

## 2022-05-14 DIAGNOSIS — M7741 Metatarsalgia, right foot: Secondary | ICD-10-CM

## 2022-05-14 DIAGNOSIS — M722 Plantar fascial fibromatosis: Secondary | ICD-10-CM

## 2022-05-14 DIAGNOSIS — M79671 Pain in right foot: Secondary | ICD-10-CM

## 2022-05-31 ENCOUNTER — Ambulatory Visit: Payer: Medicare Other | Attending: Podiatry

## 2022-05-31 DIAGNOSIS — M79672 Pain in left foot: Secondary | ICD-10-CM | POA: Diagnosis not present

## 2022-05-31 DIAGNOSIS — M79671 Pain in right foot: Secondary | ICD-10-CM | POA: Diagnosis present

## 2022-05-31 DIAGNOSIS — M722 Plantar fascial fibromatosis: Secondary | ICD-10-CM | POA: Insufficient documentation

## 2022-05-31 DIAGNOSIS — R262 Difficulty in walking, not elsewhere classified: Secondary | ICD-10-CM | POA: Insufficient documentation

## 2022-05-31 DIAGNOSIS — R600 Localized edema: Secondary | ICD-10-CM | POA: Diagnosis present

## 2022-05-31 DIAGNOSIS — M6281 Muscle weakness (generalized): Secondary | ICD-10-CM | POA: Diagnosis present

## 2022-05-31 NOTE — Therapy (Addendum)
OUTPATIENT PHYSICAL THERAPY LOWER EXTREMITY EVALUATION PHYSICAL THERAPY DISCHARGE SUMMARY  Visits from Start of Care: 1  Current functional level related to goals / functional outcomes: No re-assessment   Remaining deficits: Status unknown   Education / Equipment: N/A   Patient agrees to discharge. Patient goals were not met. Patient is being discharged due to not returning since the last visit.   Patient Name: Teresa Rice MRN: [id number] DOB:1946-05-15, 76 y.o., female Today's Date: 05/31/2022   PT End of Session - 05/31/22 0959     Visit Number 1    Number of Visits 7    Date for PT Re-Evaluation 07/14/22    Authorization Type MCR/MCD    Progress Note Due on Visit 10    PT Start Time 1011    PT Stop Time 1056    PT Time Calculation (min) 45 min    Activity Tolerance Patient tolerated treatment well    Behavior During Therapy [facility] for tasks assessed/performed             Past Medical History:  Diagnosis Date   Bladder cancer ([place]) first dx 2012   Recurrent Bladder Cancer--  (urologist-  dr Diona Fanti)   Chronic constipation    Closed fracture of left distal radius 08/30/2016   Full dentures    GERD (gastroesophageal reflux disease)    Hyperlipidemia    Hypertension    Mild intermittent asthma    OA (osteoarthritis)    fingers    Trigger finger of both hands    wear slints and special gloves at night   Type 2 diabetes mellitus ([place])    Past Surgical History:  Procedure Laterality Date   BLADDER SURGERY  2012   in [place]   CYSTOSCOPY/RETROGRADE/URETEROSCOPY Left 05/24/2014   Procedure: CYSTOSCOPY/RETROGRADE/ URETEROSCOPY;  Surgeon: Jorja Loa, MD;  Location: [facility];  Service: Urology;  Laterality: Left;   TRANSURETHRAL RESECTION OF BLADDER TUMOR N/A 05/24/2014   Procedure: TRANSURETHRAL RESECTION OF BLADDER TUMOR (TURBT) ;  Surgeon: Jorja Loa, MD;  Location: [facility];  Service: Urology;  Laterality:  N/A;   TRANSURETHRAL RESECTION OF BLADDER TUMOR N/A 08/04/2015   Procedure: TRANSURETHRAL RESECTION OF BLADDER TUMOR (TURBT);  Surgeon: Franchot Gallo, MD;  Location: [facility];  Service: Urology;  Laterality: N/A;   Patient Active Problem List   Diagnosis Date Noted   Major depressive disorder, recurrent episode, moderate ([place]) 02/06/2022   Screening for colorectal cancer patient refuses 02/06/2022   Subclinical hypothyroidism 11/08/2021   Osteopenia 05/05/2020   History of bladder cancer 10/14/2019   Morton's neuroma of both feet 08/10/2019   Seasonal allergies 05/06/2019   Gastroesophageal reflux disease 05/06/2019   Moderate persistent asthma without complication 14/48/1856   Acquired hypothyroidism 05/06/2019   Chronic bilateral low back pain without sciatica 05/06/2019   Type 2 diabetes mellitus with insulin therapy ([place]) 05/06/2019   Hyperlipidemia associated with type 2 diabetes mellitus ([place]) 05/06/2019   Hypertension associated with diabetes ([place]) 05/06/2019   PTSD (post-traumatic stress disorder) 11/10/2018   Bilateral carpal tunnel syndrome 10/11/2015   Primary osteoarthritis of both hands 10/11/2015   Trigger finger of both hands 10/11/2015    PCP: Leamon Arnt, MD  REFERRING PROVIDER: Trula Slade, DPM   REFERRING DIAG: M72.2 (ICD-10-CM) - Plantar fasciitis   THERAPY DIAG:  Pain in left foot  Pain in right foot  Difficulty in walking, not elsewhere classified  Muscle weakness (generalized)  Localized edema  Rationale for Evaluation and Treatment Rehabilitation  ONSET DATE: chronic   SUBJECTIVE:   SUBJECTIVE STATEMENT: Patient reports that both of her feet were swollen, painful, and had a rash (the rash was present all over) that began a few months ago She states she was misdiagnosed with scabies and got a second opinion and was 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 She was given a steroid cream, which did relieve her rash, though  she continues to have pain and swelling in her feet. She has tried various shoes for the swelling, but this has not helped. She tried compression socks, but this made her swelling worse. She reports prior to the swelling/rash, the bottom of her feet had been hurting for about 6 months without known cause. She reports the pain is aching along the plantar aspect of bilateral feet when she goes to walk. She reports tingling in her Rt toes that is constant.   PERTINENT HISTORY: Per patient she was misdiagnosed with scabies in April.  Osteopenia, history of bladder cancer, diabetes  PAIN:  Are you having pain? None currently; at worst: NPRS scale: 10/10 Pain location: bilateral plantar feet Pain description: ache Aggravating factors: walking, standing; first thing in the morning Relieving factors: rest    PRECAUTIONS: None  WEIGHT BEARING RESTRICTIONS No  FALLS:  Has patient fallen in last 6 months? No  LIVING ENVIRONMENT: Lives with: lives alone Lives in: House/apartment Stairs: No Has following equipment at home: None  OCCUPATION: retired  PLOF: Independent  PATIENT GOALS "I want my feet back. I don't want them to hurt."    OBJECTIVE:   DIAGNOSTIC FINDINGS: X-ray: No evidence of acute fracture noted.   PATIENT SURVEYS:  FOTO 46% function to 56% predicted  COGNITION:  Overall cognitive status: Within functional limits for tasks assessed     SENSATION: Not tested  INSPECTION:  No redness or warmth about bilateral feet.  Mild non-pitting edema present     POSTURE: rounded shoulders and forward head  PALPATION: TTP about plantar aspect of bilateraly foot, reports the most tenderness about calcaneal tubercle  LOWER EXTREMITY ROM:  Active ROM Right eval Left eval  Hip flexion    Hip extension    Hip abduction    Hip adduction    Hip internal rotation    Hip external rotation    Knee flexion    Knee extension    Ankle dorsiflexion Lacking 1 Lacking 3   Ankle plantarflexion    Ankle inversion    Ankle eversion    Great toe extension PROM 45 pn 40 pn   (Blank rows = not tested)  LOWER EXTREMITY MMT:  MMT Right eval Left eval  Hip flexion    Hip extension    Hip abduction    Hip adduction    Hip internal rotation    Hip external rotation    Knee flexion    Knee extension    Ankle dorsiflexion 5 5  Ankle plantarflexion 4+ pain (seated) 4+ pain (seated)  Ankle inversion 5 5  Ankle eversion 5 5   (Blank rows = not tested)  LOWER EXTREMITY SPECIAL TESTS:  Ankle special tests: Anterior drawer test: negative, Talar tilt test: negative, Tinel's test-Posterior tibialis: negative, and Great toe extension test: positive   FUNCTIONAL TESTS:  SLS unable bilateral   GAIT: Distance walked: 10 ft  Assistive device utilized: None Level of assistance: Complete Independence Comments: patient maintains supination during stance; limited push-off     TODAY'S TREATMENT:  [facility] Adult PT Treatment:                                                DATE: 05/31/22 Therapeutic Exercise: Demonstrated and issue initial HEP.   Therapeutic Activity: Education on assessment findings that will be addressed throughout duration of POC.       PATIENT EDUCATION:  Education details: see treatment; educated on following up with PCP regarding edema Person educated: Patient Education method: Explanation, Demonstration, Tactile cues, Verbal cues, and Handouts Education comprehension: verbalized understanding, returned demonstration, verbal cues required, tactile cues required, and needs further education   HOME EXERCISE PROGRAM: Access Code: [email or web address] URL: [email or web address] Date: 05/31/2022 Prepared by: Gwendolyn Grant  Exercises - Seated Self Great Toe Stretch  - 2 x daily - 7 x weekly - 3 sets - 30 sec hold - Long Sitting Calf Stretch with Strap  - 2 x daily - 7 x weekly - 3 sets - 30 sec  hold - Arch Lifting  - 2 x daily - 7 x  weekly - 2 sets - 10 reps  ASSESSMENT:  CLINICAL IMPRESSION: Patient is a 76 y.o. female who was seen today for physical therapy evaluation and treatment for chronic bilateral foot pain. Her signs and symptoms appear consistent with plantar fasciitis as she has palpable tenderness about the plantar fascia, limited ankle DF and great toe extension ROM, antalgic gait secondary to pain, and balance deficits. In addition she is noted to have mild non-pitting edema in bilateral feet. Upon further assessment the edema appears non-musculoskeletal in nature with patient reporting noting the onset of swelling when she changed her blood pressure medication. It was recommended that she discuss this with her PCP to determine cause/treatment for her ongoing edema with patient verbalizing understanding. She will benefit from skilled PT to address the above stated deficits in order to optimize her function.    OBJECTIVE IMPAIRMENTS Abnormal gait, decreased activity tolerance, decreased balance, decreased knowledge of condition, decreased mobility, difficulty walking, decreased ROM, decreased strength, increased edema, impaired flexibility, and pain.   ACTIVITY LIMITATIONS carrying, lifting, standing, squatting, stairs, and locomotion level  PARTICIPATION LIMITATIONS: meal prep, cleaning, laundry, shopping, community activity, and yard work  PERSONAL FACTORS Age, Fitness, Time since onset of injury/illness/exacerbation, and 3+ comorbidities: diabetes, hypertension, history of bladder cancer  are also affecting patient's functional outcome.   REHAB POTENTIAL: Good  CLINICAL DECISION MAKING: Stable/uncomplicated  EVALUATION COMPLEXITY: Low   GOALS: Goals reviewed with patient? No  SHORT TERM GOALS: Target date: 06/21/2022  Patient will be independent and compliant with initial HEP.   Baseline: Goal status: INITIAL  2.  Patient will demonstrate at least 5 degrees of bilateral ankle DF AROM to improve  gait mechanics.  Baseline:  Goal status: INITIAL  3.  Patient will maintain SLS for at least 2 seconds bilaterally to improve stability when walking on uneven terrain.  Baseline:  Goal status: INITIAL    LONG TERM GOALS: Target date: 07/12/2022   Patient will demonstrate at least 50 degrees of bilateral great toe passive extension ROM to reduce stress about the plantar fascia when walking.  Baseline:  Goal status: INITIAL  2.  Patient will report at least a 50% reduction in her pain to improve her tolerance to walking/standing activity.  Baseline: 10/10 Goal status: INITIAL  3.  Patient will score at least 56%  function on FOTO to signify clinically meaningful improvement in functional abilities.  Baseline:  Goal status: INITIAL  4.  Patient will be independent with advanced home program to progress/maintain current level of function.  Baseline:  Goal status: INITIAL     PLAN: PT FREQUENCY: 1x/week  PT DURATION: 6 weeks  PLANNED INTERVENTIONS: Therapeutic exercises, Therapeutic activity, Neuromuscular re-education, Balance training, Gait training, Patient/Family education, Joint mobilization, Stair training, Aquatic Therapy, Dry Needling, Cryotherapy, Moist heat, Taping, Vasopneumatic device, Ultrasound, Manual therapy, and Re-evaluation  PLAN FOR NEXT SESSION: STM to plantar fascia, consider TPDN, calf/great toe stretching, foot instrinsic strengthening, static balance, plantarflexor strengthening; did she f/u with PCP regarding edema   Gwendolyn Grant, PT, DPT, ATC 05/31/22 1:00 PM  Gwendolyn Grant, PT, DPT, ATC 06/22/22 12:24 PM

## 2022-06-07 ENCOUNTER — Telehealth: Payer: Self-pay

## 2022-06-07 ENCOUNTER — Ambulatory Visit: Payer: Medicare Other

## 2022-06-07 NOTE — Telephone Encounter (Signed)
LVM regarding missed PT appointment. Notified patient regarding next scheduled visit and reviewed attendance policy.   Gwendolyn Grant, PT, DPT, ATC 06/07/22 11:57 AM

## 2022-06-14 ENCOUNTER — Ambulatory Visit (INDEPENDENT_AMBULATORY_CARE_PROVIDER_SITE_OTHER): Payer: Medicare Other | Admitting: Psychiatry

## 2022-06-14 ENCOUNTER — Encounter: Payer: Self-pay | Admitting: Psychiatry

## 2022-06-14 DIAGNOSIS — F4001 Agoraphobia with panic disorder: Secondary | ICD-10-CM | POA: Diagnosis not present

## 2022-06-14 DIAGNOSIS — F331 Major depressive disorder, recurrent, moderate: Secondary | ICD-10-CM

## 2022-06-14 DIAGNOSIS — G4721 Circadian rhythm sleep disorder, delayed sleep phase type: Secondary | ICD-10-CM

## 2022-06-14 DIAGNOSIS — F5105 Insomnia due to other mental disorder: Secondary | ICD-10-CM | POA: Diagnosis not present

## 2022-06-14 DIAGNOSIS — F4310 Post-traumatic stress disorder, unspecified: Secondary | ICD-10-CM

## 2022-06-14 MED ORDER — VILAZODONE HCL 20 MG PO TABS: 20.0000 mg | ORAL_TABLET | Freq: Every day | ORAL | 1 refills | Status: DC

## 2022-06-14 NOTE — Progress Notes (Signed)
Teresa Rice [id number] 12-12-1945 76 y.o.  Subjective:   Patient ID:  Teresa Rice is a 76 y.o. (DOB 07/10/1946) female.  Chief Complaint:  Chief Complaint  Patient presents with   Follow-up   Depression   Post-Traumatic Stress Disorder    HPI Teresa Rice presents to the office today for follow-up of anxiety.    seen Dec 2020 without med changes though she had previously stopped SSRI AMA.  MD had concerns over LT panic,  05/25/20 appt with the following noted: No vaccine. [place] out of bed.  Hurt herself.  Needs eye surgery and worry about it and DM.  Fights off depression and anxiety.  Feels stressed.   No caffeine.  Not aware if something bothering her.  No full panic.  Not drowsy daytime. .  Also falling asleep on the couch though she doesn't want to do it.  Chest gets tight with anxiety. No weight loss off Paxil but no weight gain either.  Overall is not worse still and pleased with that. 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 Still doing well with the Xanax. Usually 1/2 mg TID and 1 mg HS.  Plan no med changes  11/24/20 appt with following noted: Some issues with D about her past and a son taken from her at 76 yo.  Recent conflict, 2 days before Christmas,  through me for a loop and said things she regrets to her D out of anger.   The 64 yo son will not listen to her or let her tell her side of the story.  Very confusing to me.  It is a hard thing to cope with..  She tried to get court records about it but was told it was a closed case. Good news November 14 was baptized.  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  [place].  Will be another great grandmother will be a little boy.   Due 03/17/20.   Can awaken with chest pressure in the morning but it stops short of full panic.  Did not have it this morning.    Plan no med changes  01/18/2021 phone call complaining of very depression, staying in bed until 11  AM not wanting to drive, and cannot shut her mind off and asking about an antidepressant.  She had previously been on paroxetine but wanted to stop it.  Therefore we started duloxetine 30 mg 1 daily for 1 week and then 2 daily  03/08/2021 appointment with the following noted: Never started duloxetine bc read about SE of tremor and never took it. The got RX for paroxetine to restart and Had to stop it bc shaking and felt sick going into 3rd week on it.  Tremor too bad. Depression triggered by conversation between estranged son (stolen from me)  and her daughter.  Really upset her.   Now some days more depressed and some days she feels and functions better.  This problem sticks in her head and bothers her.  Thinks son said he hopes she dies.  Haven't talked to son in 81 years. More bad days than good.   Plan rec low dose 10 Lexapro  05/08/2021 appt noted: Taken Lexapro.  Calmer and don't feel upset.  I feel good.  No SE with the med.  Took a little while to help.  Doesn't talk about son.  That was the root of my problem. Patient reports stable mood and denies depressed or irritable moods.  Patient denies any recent difficulty with anxiety.  Patient denies difficulty with sleep initiation or maintenance. Denies appetite disturbance.  Patient reports that energy and motivation have been good.  Patient denies any difficulty with concentration.  Patient denies any suicidal ideation. Loves 2 great grand-sons [name] 2, [name] 9 weeks. Got Covid since here. Cough resolved but still a little tired.  10/11/21 appt noted: Stopped Lexapro bc just wanted to see if I'd be OK and was more irritable.  Then restarted it last month. Sleep a long time.  She is fearful about cuitting it back  Doesn't feels she can do without Xanax or reduce it.  Not markely depressed. Tolerating meds without SE.  No SI  04/10/22 APPT noted: Stopped Lexapro AMA DT tremor hands.  Were so bad it was hard to hold a cup of coffee.  This  resolved off the med.  Feels somewhat more irritable off of it.  A little  more depressed off it and is more nervous. Sleep good lately. Still alprazolam same dose Thinks paroxetine caused shakes too.  Plan: Don't change meds on her own..  Tends to want to reduce or stop SSRIs repeatedly and always gets worse.  Has done so again complaining of trmeor with them. Viibryd 10 for a week  then 20 mg daily.  06/14/22  Appt noted; VIibryd good. Takes it with lunch.   More relaxed.  No shaking. Not much depression or irritability.  If feel like my old self. No SE Some tiredness.  Problems with BP meds. Sleep is good. Not a morning person.    New PCP Joya Gaskins, [place], geriatric doc  Past Psychiatric Medication Trials:  Paxil 60, sertraline forgetfulness, Lexapro 10 tremor but benefit Viibryd 20 helped without tremor  trazodone, She has been under our care since 1998 and has been on the same dosage of Xanax the whole time and most of the time took paroxetine 60  D on psych med: shakes too on zoloft and others  Review of Systems:  Review of Systems  Constitutional:  Positive for fatigue.  HENT:  Positive for hearing loss and voice change.   Respiratory:  Positive for shortness of breath.   Cardiovascular:  Negative for palpitations.  Musculoskeletal:  Positive for back pain.  Neurological:  Positive for headaches. Negative for tremors.  Psychiatric/Behavioral:  Positive for dysphoric mood. Negative for agitation, behavioral problems, confusion, decreased concentration, hallucinations, self-injury, sleep disturbance and suicidal ideas. The patient is nervous/anxious. The patient is not hyperactive.     Medications: I have reviewed the patient's current medications.  Current Outpatient Medications  Medication Sig Dispense Refill   Accu-Chek Softclix Lancets lancets      albuterol (VENTOLIN HFA) 108 (90 Base) MCG/ACT inhaler Inhale 2 puffs into the lungs every 6 (six) hours as needed  for wheezing or shortness of breath. 8 g 1   ALPRAZolam (XANAX) 1 MG tablet 1/2 tablet TID and 3/4 tablet at night 71 tablet 5   amLODipine (NORVASC) 10 MG tablet Take 1 tablet (10 mg total) by mouth daily. 90 tablet 3   azelastine (ASTELIN) 0.1 % nasal spray Place 1 spray into both nostrils in the morning. Use in each nostril as directed 30 mL 2   B-D UF III MINI PEN NEEDLES 31G X 5 MM MISC USE AS DIRECTED WITH LANTUS INJECTION 100 each 0   blood glucose meter kit and supplies Dispense based on patient and insurance preference. Use up to four times daily as directed. (FOR  ICD-10 E10.9, E11.9). 1 each 0   cholecalciferol (VITAMIN D3) 25 MCG (1000 UNIT) tablet Take 1,000 Units by mouth daily.     escitalopram (LEXAPRO) 10 MG tablet Take 1 tablet (10 mg total) by mouth at bedtime. 90 tablet 1   fenofibrate 160 MG tablet Take 1 tablet by mouth once daily 90 tablet 3   fluticasone (FLONASE) 50 MCG/ACT nasal spray Place 2 sprays into both nostrils daily. 16 g 6   glucose blood test strip Use as instructed 100 each 12   hydrOXYzine (VISTARIL) 25 MG capsule Take 1 capsule (25 mg total) by mouth every 8 (eight) hours as needed for itching. 30 capsule 0   Insulin Pen Needle (PEN NEEDLES) 32G X 5 MM MISC Please use new needle after very Lantus injection. 100 each 3   LANTUS SOLOSTAR 100 UNIT/ML Solostar Pen INJECT 15 UNITS SUBCUTANEOUSLY ONCE DAILY AT NIGHT 15 mL 0   levothyroxine (SYNTHROID) 25 MCG tablet Take one pill daily for thyroid 90 tablet 3   losartan (COZAAR) 100 MG tablet Take 1 tablet (100 mg total) by mouth daily. 90 tablet 3   magnesium oxide (MAG-OX) 400 MG tablet Take 400 mg by mouth 2 (two) times daily.     metFORMIN (GLUCOPHAGE) 1000 MG tablet Take 1 tablet (1,000 mg total) by mouth 2 (two) times daily with a meal. 180 tablet 3   metoprolol succinate (TOPROL-XL) 25 MG 24 hr tablet Take 1 tablet (25 mg total) by mouth daily. 90 tablet 3   montelukast (SINGULAIR) 10 MG tablet Take 1 tablet  (10 mg total) by mouth at bedtime. 90 tablet 1   Multiple Vitamin (MULTIVITAMIN WITH MINERALS) TABS tablet Take 1 tablet by mouth daily.     Omega 3 1000 MG CAPS Take 1,000 mg by mouth daily.     omeprazole (PRILOSEC) 20 MG capsule Take 1 capsule (20 mg total) by mouth daily. 90 capsule 3   Polyethylene Glycol 3350 (MIRALAX PO) Take by mouth every evening.     rosuvastatin (CRESTOR) 10 MG tablet Take 1 tablet (10 mg total) by mouth daily. 90 tablet 3   Semaglutide, 1 MG/DOSE, 4 MG/3ML SOPN Inject 1 mg as directed once a week. 9 mL 3   SYMBICORT 160-4.5 MCG/ACT inhaler Inhale 2 puffs into the lungs 2 (two) times daily. 11 g 11   traMADol (ULTRAM) 50 MG tablet Take 1 tablet by mouth every 6 (six) hours as needed.     VITAMIN E PO Take by mouth.     Vilazodone HCl 20 MG TABS Take 1 tablet (20 mg total) by mouth daily. 90 tablet 1   No current facility-administered medications for this visit.    Medication Side Effects: None  Allergies:  Allergies  Allergen Reactions   Celebrex [Celecoxib] Anaphylaxis   Glipizide Itching   Penicillins Other (See Comments)    "Burn marks from inside out"    Sulfa Antibiotics Itching    Past Medical History:  Diagnosis Date   Bladder cancer ([place]) first dx 2012   Recurrent Bladder Cancer--  (urologist-  dr Diona Fanti)   Chronic constipation    Closed fracture of left distal radius 08/30/2016   Full dentures    GERD (gastroesophageal reflux disease)    Hyperlipidemia    Hypertension    Mild intermittent asthma    OA (osteoarthritis)    fingers    Trigger finger of both hands    wear slints and special gloves at night   Type 2  diabetes mellitus ([place])     Family History  Problem Relation Age of Onset   Early death Mother    AAA (abdominal aortic aneurysm) Sister    Cancer Father    Diabetes Brother    Cancer Brother     Social History   Socioeconomic History   Marital status: Widowed    Spouse name: Not on file   Number of children: 1    Years of education: 9   Highest education level: Not on file  Occupational History   Occupation: retired  Tobacco Use   Smoking status: Former    Years: 30.00    Types: Cigarettes    Quit date: 05/21/1994    Years since quitting: 28.0   Smokeless tobacco: Never  Vaping Use   Vaping Use: Never used  Substance and Sexual Activity   Alcohol use: Yes    Comment: RARE   Drug use: No   Sexual activity: Not on file  Other Topics Concern   Not on file  Social History Narrative   Lives in an apartment, lives alone   One daughter   Right handed    Retired from [employer] work   Highest level of education:  GED   Social Determinants of [place] Strain: [place]  (12/15/2020)   Overall Financial Resource Strain (CARDIA)    Difficulty of Paying Living Expenses: Not hard at [place]: No [place] (12/15/2020)   Hunger Vital Sign    Worried About Running Out of Food in the Last Year: Never true    [place] in the Last Year: Never true  Transportation Needs: No Transportation Needs (12/15/2020)   PRAPARE - Hydrologist (Medical): No    Lack of Transportation (Non-Medical): No  Physical Activity: Inactive (12/15/2020)   Exercise Vital Sign    Days of Exercise per Week: 0 days    Minutes of Exercise per Session: 0 min  Stress: No Stress Concern Present (12/15/2020)   [place]    Feeling of Stress : Only a little  Social Connections: Moderately Isolated (12/15/2020)   Social Connection and Isolation Panel [NHANES]    Frequency of Communication with Friends and Family: More than three times a week    Frequency of Social Gatherings with Friends and Family: Never    Attends Religious Services: More than 4 times per year    Active Member of [employer] or Organizations: No    Attends Archivist Meetings: Never    Marital Status: Widowed  Intimate  Partner Violence: Not At Risk (12/15/2020)   Humiliation, Afraid, Rape, and Kick questionnaire    Fear of Current or Ex-Partner: No    Emotionally Abused: No    Physically Abused: No    Sexually Abused: No    Past Medical History, Surgical history, Social history, and Family history were reviewed and updated as appropriate.   2 new hearing aids.  Please see review of systems for further details on the patient's review from today.   Objective:   Physical Exam:  LMP  (LMP Unknown)   Physical Exam Constitutional:      General: She is not in acute distress. Musculoskeletal:        General: No deformity.  Neurological:     Mental Status: She is alert and oriented to person, place, and time.     Motor: No tremor.  Coordination: Coordination normal.     Gait: Gait normal.  Psychiatric:        Attention and Perception: Attention normal. She does not perceive auditory hallucinations.        Mood and Affect: Mood is not anxious or depressed. Affect is not labile, blunt, angry, tearful or inappropriate.        Speech: Speech normal.        Behavior: Behavior normal.        Thought Content: Thought content normal. Thought content is not delusional. Thought content does not include homicidal or suicidal ideation. Thought content does not include suicidal plan.        Cognition and Memory: Cognition normal.        Judgment: Judgment normal.     Comments: Insight fair.   No auditory or visual hallucinations. No delusions.  Reads on internet about meds and makes decisions on her on including about medical meds and psych meds.     Lab Review:     Component Value Date/Time   NA 141 02/06/2022 1028   K 4.3 02/06/2022 1028   CL 105 02/06/2022 1028   CO2 27 02/06/2022 1028   GLUCOSE 117 (H) 02/06/2022 1028   BUN 18 02/06/2022 1028   CREATININE 0.93 02/06/2022 1028   CALCIUM 10.0 02/06/2022 1028   PROT 7.4 02/06/2022 1028   ALBUMIN 3.9 02/06/2022 1028   AST 13 02/06/2022 1028    ALT 9 02/06/2022 1028   ALKPHOS 79 02/06/2022 1028   BILITOT 0.4 02/06/2022 1028   GFRNONAA 87 (L) 05/24/2014 0620   GFRAA >90 05/24/2014 0620       Component Value Date/Time   WBC 10.3 02/06/2022 1028   RBC 4.34 02/06/2022 1028   HGB 10.9 (L) 02/06/2022 1028   HCT 33.5 (L) 02/06/2022 1028   PLT 608.0 (H) 02/06/2022 1028   MCV 77.1 (L) 02/06/2022 1028   MCH 26.2 05/24/2014 0620   MCHC 32.5 02/06/2022 1028   RDW 15.3 02/06/2022 1028   LYMPHSABS 2.3 02/06/2022 1028   MONOABS 0.6 02/06/2022 1028   EOSABS 0.2 02/06/2022 1028   BASOSABS 0.1 02/06/2022 1028    No results found for: "POCLITH", "LITHIUM"   No results found for: "PHENYTOIN", "PHENOBARB", "VALPROATE", "CBMZ"   .res Assessment: Plan:    Adonica was seen today for follow-up, depression and post-traumatic stress disorder.  Diagnoses and all orders for this visit:  Panic disorder with agoraphobia -     Vilazodone HCl 20 MG TABS; Take 1 tablet (20 mg total) by mouth daily.  PTSD (post-traumatic stress disorder) -     Vilazodone HCl 20 MG TABS; Take 1 tablet (20 mg total) by mouth daily.  Major depressive disorder, recurrent episode, moderate (HCC) -     Vilazodone HCl 20 MG TABS; Take 1 tablet (20 mg total) by mouth daily.  Insomnia due to mental condition  Delayed sleep phase syndrome  Greater than 50% of 30 min face to face time with patient was spent on counseling and coordination of care. We discussed Overall doing worse with family stressors.  Less anxious and depressed with viibryd without tremors she had with SSRIs.   Chronic avoidance and general fearfulness at baseline. Chronically easily stressed.   Insomnia is better.    Rec exercise.  Supportive therapy around dealing with recent family conflict.  Problems solving on how to deal with this issue from an estranged son and daughter participating in it..    Does not think she can  tolerate anxiety without Xanax.  A lot of benefit and no SE.  We discussed  the short-term risks associated with benzodiazepines including sedation and increased fall risk among others.  Discussed long-term side effect risk including dependence, potential withdrawal symptoms, and the potential eventual dose-related risk of dementia.  But recent studies from 2020 dispute this association between benzodiazepines and dementia risk. Newer studies in 2020 do not support an association with dementia.  Satisfied with Xanax now..  Already at significant dose for age.  She has kept benefit.  Tolerated well.    Don't change meds on her own..  Tends to want to reduce or stop SSRIs repeatedly and always gets worse.  Has done so again complaining of trmeor with them.  Viibryd 20 mg daily helped anxiety and irritability and depression. Likely to need something LT DT age and LT sx.  Insight is lacking on this subject.  Encourage consistent  FU 6 mos  Lynder Parents, MD, DFAPA   Please see After Visit Summary for patient specific instructions.  Future Appointments  Date Time Provider [place]  06/15/2022 11:45 AM Jarome Matin [facility]  06/22/2022 11:45 AM Edwin Cap, PT [facility]  06/29/2022 11:45 AM Edwin Cap, PT [facility]  07/05/2022 11:00 AM Edwin Cap, PT [facility]  07/06/2022 10:00 AM [facility]  07/06/2022 11:00 AM [facility]  07/13/2022 11:45 AM [place], Crissie Figures, PT [facility]  07/16/2022  2:00 PM Trula Slade, DPM [facility]  08/09/2022 10:30 AM Leamon Arnt, MD [facility]    No orders of the defined types were placed in this encounter.     -------------------------------

## 2022-06-15 ENCOUNTER — Other Ambulatory Visit: Payer: Self-pay | Admitting: *Deleted

## 2022-06-15 ENCOUNTER — Telehealth: Payer: Self-pay

## 2022-06-15 ENCOUNTER — Ambulatory Visit: Payer: Medicare Other

## 2022-06-15 DIAGNOSIS — M79604 Pain in right leg: Secondary | ICD-10-CM

## 2022-06-15 NOTE — Telephone Encounter (Signed)
LVM regarding missed PT appointment. Reviewed attendance policy notifying patient that she can schedule one visit at a time due to her attendance and that if she misses another scheduled appointment she will be discharged from PT and require a new referral.   Gwendolyn Grant, PT, DPT, ATC 06/15/22 12:04 PM

## 2022-06-18 ENCOUNTER — Other Ambulatory Visit: Payer: Self-pay | Admitting: Family Medicine

## 2022-06-18 ENCOUNTER — Telehealth: Payer: Self-pay | Admitting: Family Medicine

## 2022-06-18 NOTE — Telephone Encounter (Signed)
Patient has called in requesting transfer from Dr. Jonni Sanger over to Dr. Cherlynn Kaiser.  States she does not wish to be a patient any longer of Dr. Jonni Sanger.  States her reason is personal.  Please advise.

## 2022-06-22 ENCOUNTER — Telehealth: Payer: Self-pay

## 2022-06-22 ENCOUNTER — Ambulatory Visit: Payer: Medicare Other

## 2022-06-22 NOTE — Telephone Encounter (Signed)
Spoke with patient regarding missed PT appointment. Reviewed attendance policy informing patient that she will be discharged and require a new referral to attend PT due to missed appointments. Patient verbalized understanding.   Gwendolyn Grant, PT, DPT, ATC 06/22/22 12:23 PM

## 2022-06-27 NOTE — Telephone Encounter (Signed)
Left vm to call back to schedule appt.  °

## 2022-07-04 ENCOUNTER — Encounter (INDEPENDENT_AMBULATORY_CARE_PROVIDER_SITE_OTHER): Payer: Self-pay

## 2022-07-06 ENCOUNTER — Encounter (HOSPITAL_COMMUNITY): Payer: Self-pay

## 2022-07-06 ENCOUNTER — Encounter (HOSPITAL_COMMUNITY): Payer: Medicare Other

## 2022-07-06 NOTE — Progress Notes (Deleted)
[facility] [place]   Reason for referral: Swollen *** leg  History of Present Illness  Teresa Rice is a 76 y.o. female who presents with chief complaint: swollen leg.  Patient notes, onset of swelling *** months ago, associated with ***.  The patient has had *** history of DVT, *** history of varicose vein, *** history of venous stasis ulcers, *** history of  Lymphedema and *** history of skin changes in lower legs.  There is *** family history of venous disorders.  The patient has *** used compression stockings in the past.  Past Medical History:  Diagnosis Date   Bladder cancer ([place]) first dx 2012   Recurrent Bladder Cancer--  (urologist-  dr Diona Fanti)   Chronic constipation    Closed fracture of left distal radius 08/30/2016   Full dentures    GERD (gastroesophageal reflux disease)    Hyperlipidemia    Hypertension    Mild intermittent asthma    OA (osteoarthritis)    fingers    Trigger finger of both hands    wear slints and special gloves at night   Type 2 diabetes mellitus [facility])     Past Surgical History:  Procedure Laterality Date   BLADDER SURGERY  2012   in [place]   CYSTOSCOPY/RETROGRADE/URETEROSCOPY Left 05/24/2014   Procedure: CYSTOSCOPY/RETROGRADE/ URETEROSCOPY;  Surgeon: Jorja Loa, MD;  Location: [facility];  Service: Urology;  Laterality: Left;   TRANSURETHRAL RESECTION OF BLADDER TUMOR N/A 05/24/2014   Procedure: TRANSURETHRAL RESECTION OF BLADDER TUMOR (TURBT) ;  Surgeon: Jorja Loa, MD;  Location: [facility];  Service: Urology;  Laterality: N/A;   TRANSURETHRAL RESECTION OF BLADDER TUMOR N/A 08/04/2015   Procedure: TRANSURETHRAL RESECTION OF BLADDER TUMOR (TURBT);  Surgeon: Franchot Gallo, MD;  Location: [facility];  Service: Urology;  Laterality: N/A;    Social History   Socioeconomic History   Marital status: Widowed    Spouse name: Not on file   Number of children:  1   Years of education: 75   Highest education level: Not on file  Occupational History   Occupation: retired  Tobacco Use   Smoking status: Former    Years: 30.00    Types: Cigarettes    Quit date: 05/21/1994    Years since quitting: 28.1   Smokeless tobacco: Never  Vaping Use   Vaping Use: Never used  Substance and Sexual Activity   Alcohol use: Yes    Comment: RARE   Drug use: No   Sexual activity: Not on file  Other Topics Concern   Not on file  Social History Narrative   Lives in an apartment, lives alone   One daughter   Right handed    Retired from [employer] work   Highest level of education:  GED   Social Determinants of [place] Strain: [place]  (12/15/2020)   Overall Financial Resource Strain (CARDIA)    Difficulty of Paying Living Expenses: Not hard at [place]: No [place] (12/15/2020)   Hunger Vital Sign    Worried About Running Out of Food in the Last Year: Never true    [place] in the Last Year: Never true  Transportation Needs: No Transportation Needs (12/15/2020)   PRAPARE - Hydrologist (Medical): No    Lack of Transportation (Non-Medical): No  Physical Activity: Inactive (12/15/2020)  Exercise Vital Sign    Days of Exercise per Week: 0 days    Minutes of Exercise per Session: 0 min  Stress: No Stress Concern Present (12/15/2020)   [place]    Feeling of Stress : Only a little  Social Connections: Moderately Isolated (12/15/2020)   Social Connection and Isolation Panel [NHANES]    Frequency of Communication with Friends and Family: More than three times a week    Frequency of Social Gatherings with Friends and Family: Never    Attends Religious Services: More than 4 times per year    Active Member of [employer] or Organizations: No    Attends Archivist Meetings: Never    Marital Status: Widowed  Intimate  Partner Violence: Not At Risk (12/15/2020)   Humiliation, Afraid, Rape, and Kick questionnaire    Fear of Current or Ex-Partner: No    Emotionally Abused: No    Physically Abused: No    Sexually Abused: No    Family History  Problem Relation Age of Onset   Early death Mother    AAA (abdominal aortic aneurysm) Sister    Cancer Father    Diabetes Brother    Cancer Brother     Current Outpatient Medications on File Prior to Visit  Medication Sig Dispense Refill   Accu-Chek Softclix Lancets lancets      albuterol (VENTOLIN HFA) 108 (90 Base) MCG/ACT inhaler Inhale 2 puffs into the lungs every 6 (six) hours as needed for wheezing or shortness of breath. 8 g 1   ALPRAZolam (XANAX) 1 MG tablet 1/2 tablet TID and 3/4 tablet at night 71 tablet 5   amLODipine (NORVASC) 10 MG tablet Take 1 tablet (10 mg total) by mouth daily. 90 tablet 3   azelastine (ASTELIN) 0.1 % nasal spray Place 1 spray into both nostrils in the morning. Use in each nostril as directed 30 mL 2   B-D UF III MINI PEN NEEDLES 31G X 5 MM MISC USE AS DIRECTED WITH LANTUS INJECTION 100 each 0   blood glucose meter kit and supplies Dispense based on patient and insurance preference. Use up to four times daily as directed. (FOR ICD-10 E10.9, E11.9). 1 each 0   cholecalciferol (VITAMIN D3) 25 MCG (1000 UNIT) tablet Take 1,000 Units by mouth daily.     escitalopram (LEXAPRO) 10 MG tablet Take 1 tablet (10 mg total) by mouth at bedtime. 90 tablet 1   fenofibrate 160 MG tablet Take 1 tablet by mouth once daily 90 tablet 3   fluticasone (FLONASE) 50 MCG/ACT nasal spray Place 2 sprays into both nostrils daily. 16 g 6   glucose blood test strip Use as instructed 100 each 12   hydrOXYzine (VISTARIL) 25 MG capsule Take 1 capsule (25 mg total) by mouth every 8 (eight) hours as needed for itching. 30 capsule 0   Insulin Pen Needle (PEN NEEDLES) 32G X 5 MM MISC Please use new needle after very Lantus injection. 100 each 3   LANTUS SOLOSTAR 100  UNIT/ML Solostar Pen INJECT 15 UNITS SUBCUTANEOUSLY ONCE DAILY AT NIGHT 15 mL 0   levothyroxine (SYNTHROID) 25 MCG tablet Take one pill daily for thyroid 90 tablet 3   losartan (COZAAR) 100 MG tablet Take 1 tablet (100 mg total) by mouth daily. 90 tablet 3   magnesium oxide (MAG-OX) 400 MG tablet Take 400 mg by mouth 2 (two) times daily.     metFORMIN (GLUCOPHAGE) 1000 MG tablet Take  1 tablet (1,000 mg total) by mouth 2 (two) times daily with a meal. 180 tablet 3   metoprolol succinate (TOPROL-XL) 25 MG 24 hr tablet Take 1 tablet (25 mg total) by mouth daily. 90 tablet 3   montelukast (SINGULAIR) 10 MG tablet Take 1 tablet (10 mg total) by mouth at bedtime. 90 tablet 1   Multiple Vitamin (MULTIVITAMIN WITH MINERALS) TABS tablet Take 1 tablet by mouth daily.     Omega 3 1000 MG CAPS Take 1,000 mg by mouth daily.     omeprazole (PRILOSEC) 20 MG capsule Take 1 capsule (20 mg total) by mouth daily. 90 capsule 3   OZEMPIC, 1 MG/DOSE, 4 MG/3ML SOPN INJECT 1MG  ONCE A WEEK 9 mL 0   Polyethylene Glycol 3350 (MIRALAX PO) Take by mouth every evening.     rosuvastatin (CRESTOR) 10 MG tablet Take 1 tablet (10 mg total) by mouth daily. 90 tablet 3   SYMBICORT 160-4.5 MCG/ACT inhaler Inhale 2 puffs into the lungs 2 (two) times daily. 11 g 11   traMADol (ULTRAM) 50 MG tablet Take 1 tablet by mouth every 6 (six) hours as needed.     Vilazodone HCl 20 MG TABS Take 1 tablet (20 mg total) by mouth daily. 90 tablet 1   VITAMIN E PO Take by mouth.     No current facility-administered medications on file prior to visit.    Allergies as of 07/06/2022 - Review Complete 06/14/2022  Allergen Reaction Noted   Celebrex [celecoxib] Anaphylaxis 05/21/2014   Glipizide Itching 08/02/2015   Penicillins Other (See Comments) 05/21/2014   Sulfa antibiotics Itching 05/21/2014     ROS:   General:  No weight loss, Fever, chills  HEENT: No recent headaches, no nasal bleeding, no visual changes, no sore  throat  Neurologic: No dizziness, blackouts, seizures. No recent symptoms of stroke or mini- stroke. No recent episodes of slurred speech, or temporary blindness.  Cardiac: No recent episodes of chest pain/pressure, no shortness of breath at rest.  No shortness of breath with exertion.  Denies history of atrial fibrillation or irregular heartbeat  Vascular: No history of rest pain in feet.  No history of claudication.  No history of non-healing ulcer, No history of DVT   Pulmonary: No home oxygen, no productive cough, no hemoptysis,  No asthma or wheezing  Musculoskeletal:  [ ]  Arthritis, [ ]  Low back pain,  [ ]  Joint pain  Hematologic:No history of hypercoagulable state.  No history of easy bleeding.  No history of anemia  Gastrointestinal: No hematochezia or melena,  No gastroesophageal reflux, no trouble swallowing  Urinary: [ ]  chronic Kidney disease, [ ]  on HD - [ ]  MWF or [ ]  TTHS, [ ]  Burning with urination, [ ]  Frequent urination, [ ]  Difficulty urinating;   Skin: No rashes  Psychological: No history of anxiety,  No history of depression  Physical Examination  There were no vitals filed for this visit.  There is no height or weight on file to calculate BMI.  General:  Alert and oriented, no acute distress HEENT: Normal Neck: No bruit or JVD Pulmonary: Clear to auscultation bilaterally Cardiac: Regular Rate and Rhythm without murmur Abdomen: Soft, non-tender, non-distended, no mass, no scars Skin: No rash Extremity Pulses:  2+ radial, brachial, femoral, dorsalis pedis, posterior tibial pulses bilaterally Musculoskeletal: No deformity or edema  Neurologic: Upper and lower extremity motor 5/5 and symmetric  DATA:  Assessment:  Plan:  Ruta Hinds, MD [facility]  [facility] Office: [phone] Pager: [phone]

## 2022-07-12 ENCOUNTER — Encounter: Payer: Self-pay | Admitting: Family Medicine

## 2022-07-12 ENCOUNTER — Ambulatory Visit (INDEPENDENT_AMBULATORY_CARE_PROVIDER_SITE_OTHER): Payer: Medicare Other | Admitting: Family Medicine

## 2022-07-12 VITALS — BP 138/78 | HR 76 | Temp 97.6°F | Ht 63.0 in | Wt 166.8 lb

## 2022-07-12 DIAGNOSIS — E039 Hypothyroidism, unspecified: Secondary | ICD-10-CM | POA: Diagnosis not present

## 2022-07-12 DIAGNOSIS — E1159 Type 2 diabetes mellitus with other circulatory complications: Secondary | ICD-10-CM

## 2022-07-12 DIAGNOSIS — D500 Iron deficiency anemia secondary to blood loss (chronic): Secondary | ICD-10-CM

## 2022-07-12 DIAGNOSIS — I152 Hypertension secondary to endocrine disorders: Secondary | ICD-10-CM

## 2022-07-12 DIAGNOSIS — E119 Type 2 diabetes mellitus without complications: Secondary | ICD-10-CM

## 2022-07-12 DIAGNOSIS — Z794 Long term (current) use of insulin: Secondary | ICD-10-CM

## 2022-07-12 DIAGNOSIS — R002 Palpitations: Secondary | ICD-10-CM | POA: Diagnosis not present

## 2022-07-12 LAB — IBC + FERRITIN
Ferritin: 12.6 ng/mL (ref 10.0–291.0)
Iron: 77 ug/dL (ref 42–145)
Saturation Ratios: 15.4 % — ABNORMAL LOW (ref 20.0–50.0)
TIBC: 498.4 ug/dL — ABNORMAL HIGH (ref 250.0–450.0)
Transferrin: 356 mg/dL (ref 212.0–360.0)

## 2022-07-12 LAB — LIPID PANEL
Cholesterol: 136 mg/dL (ref 0–200)
HDL: 30.9 mg/dL — ABNORMAL LOW (ref >39.00)
NonHDL: 104.78
Total CHOL/HDL Ratio: 4
Triglycerides: 221 mg/dL — ABNORMAL HIGH (ref 0.0–149.0)
VLDL: 44.2 mg/dL — ABNORMAL HIGH (ref 0.0–40.0)

## 2022-07-12 LAB — CBC WITH DIFFERENTIAL/PLATELET
Basophils Absolute: 0.1 10*3/uL (ref 0.0–0.1)
Basophils Relative: 1 % (ref 0.0–3.0)
Eosinophils Absolute: 0.2 10*3/uL (ref 0.0–0.7)
Eosinophils Relative: 3.3 % (ref 0.0–5.0)
HCT: 37.6 % (ref 36.0–46.0)
Hemoglobin: 12.2 g/dL (ref 12.0–15.0)
Lymphocytes Relative: 35.9 % (ref 12.0–46.0)
Lymphs Abs: 2.5 10*3/uL (ref 0.7–4.0)
MCHC: 32.3 g/dL (ref 30.0–36.0)
MCV: 78.3 fl (ref 78.0–100.0)
Monocytes Absolute: 0.4 10*3/uL (ref 0.1–1.0)
Monocytes Relative: 5.8 % (ref 3.0–12.0)
Neutro Abs: 3.8 10*3/uL (ref 1.4–7.7)
Neutrophils Relative %: 54 % (ref 43.0–77.0)
Platelets: 318 10*3/uL (ref 150.0–400.0)
RBC: 4.8 Mil/uL (ref 3.87–5.11)
RDW: 14.9 % (ref 11.5–15.5)
WBC: 7 10*3/uL (ref 4.0–10.5)

## 2022-07-12 LAB — COMPREHENSIVE METABOLIC PANEL
ALT: 24 U/L (ref 0–35)
AST: 25 U/L (ref 0–37)
Albumin: 4.4 g/dL (ref 3.5–5.2)
Alkaline Phosphatase: 67 U/L (ref 39–117)
BUN: 24 mg/dL — ABNORMAL HIGH (ref 6–23)
CO2: 28 mEq/L (ref 19–32)
Calcium: 10.4 mg/dL (ref 8.4–10.5)
Chloride: 102 mEq/L (ref 96–112)
Creatinine, Ser: 0.88 mg/dL (ref 0.40–1.20)
GFR: 63.66 mL/min (ref >60.00)
Glucose, Bld: 174 mg/dL — ABNORMAL HIGH (ref 70–99)
Potassium: 4.4 mEq/L (ref 3.5–5.1)
Sodium: 141 mEq/L (ref 135–145)
Total Bilirubin: 0.5 mg/dL (ref 0.2–1.2)
Total Protein: 7.1 g/dL (ref 6.0–8.3)

## 2022-07-12 LAB — HEMOGLOBIN A1C: Hgb A1c MFr Bld: 7.7 % — ABNORMAL HIGH (ref 4.6–6.5)

## 2022-07-12 LAB — LDL CHOLESTEROL, DIRECT: Direct LDL: 75 mg/dL

## 2022-07-12 LAB — TSH: TSH: 3.36 u[IU]/mL (ref 0.35–5.50)

## 2022-07-12 LAB — VITAMIN B12: Vitamin B-12: 613 pg/mL (ref 211–911)

## 2022-07-12 LAB — MAGNESIUM: Magnesium: 1.4 mg/dL — ABNORMAL LOW (ref 1.5–2.5)

## 2022-07-12 MED ORDER — LOSARTAN POTASSIUM 100 MG PO TABS: 100.0000 mg | ORAL_TABLET | Freq: Every day | ORAL | 3 refills | Status: DC

## 2022-07-12 NOTE — Patient Instructions (Signed)
Stop the lisinopril, go back to the losartan '100mg'$ .

## 2022-07-12 NOTE — Progress Notes (Signed)
Subjective:     Patient ID: Teresa Rice, female    DOB: [birth year], 76 y.o.   MRN: [id number]  Chief Complaint  Patient presents with   Transitions Of Care    HPI [facility] Dr. Jonni Sanger.  Was going to Geriatric practice. April and this month. Meds were changed.  Pt will not be going back as has seen 4 different doctors and things are worse.    HTN-Lisiniopril 36m not losartan.  Taking furosemide 28m  And metoprolol XL 50 mg blood pressure was 158/84 last pm.  They have been elevated since the change was made.  Prior to seeing the geriatric practice, blood pressures were controlled (amlodipine 10 mg and losartan 100 mg metolprolol 25).  Taking otc K qod.  Irreg heartbeat-had EKG at other practice.  "Body went crazy" in general.  Metoprolol for heart rate and bp   per patient, physician talked to cardiology and was told from asthma and bp.  Occ cp-but could be anx-at rest.  Can walk to dumpster and HR 144.  No cp.  Getting some HA.  No dizziness.  Bladder ca-cancer free for 8 yrs! Urol retiring.  Anxiety-on xanax 1/2 tab qid and Vilazedone.  Managed by psych Anemia-Per review of the labs.  Asa, rosuvastatin 10, xanax, vilazodone 2021momep 20,furosemide 20, metoprolol xl 50, lisinopril 5, levothy 25 , metformin, fenofibrate, gabapentin, lantus 30  There are no preventive care reminders to display for this patient.  Past Medical History:  Diagnosis Date   [facility]   Recurrent Bladder Cancer--  (urologist-  dr dahDiona Fanti Chronic constipation    Closed fracture of left distal radius 08/30/2016   Full dentures    GERD (gastroesophageal reflux disease)    Hyperlipidemia    Hypertension    Mild intermittent asthma    OA (osteoarthritis)    fingers    Trigger finger of both hands    wear slints and special gloves at night   [facility]   Past Surgical History:  Procedure Laterality Date   [place] Left 05/24/2014   Procedure: CYSTOSCOPY/RETROGRADE/ URETEROSCOPY;  Surgeon: SteJorja LoaD;  [facility];  Service: Urology;  Laterality: Left;   TRANSURETHRAL RESECTION OF BLADDER TUMOR N/A 05/24/2014   Procedure: TRANSURETHRAL RESECTION OF BLADDER TUMOR (TURBT) ;  Surgeon: SteJorja LoaD;  [facility];  Service: Urology;  Laterality: N/A;   TRANSURETHRAL RESECTION OF BLADDER TUMOR N/A 08/04/2015   Procedure: TRANSURETHRAL RESECTION OF BLADDER TUMOR (TURBT);  Surgeon: SteFranchot GalloD;  [facility]Service: Urology;  Laterality: N/A;    Outpatient Medications Prior to Visit  Medication Sig Dispense Refill   Accu-Chek Softclix Lancets lancets      albuterol (VENTOLIN HFA) 108 (90 Base) MCG/ACT inhaler Inhale 2 puffs into the lungs every 6 (six) hours as needed for wheezing or shortness of breath. 8 g 1   ALPRAZolam (XANAX) 1 MG tablet 1/2 tablet TID and 3/4 tablet at night 71 tablet 5   azelastine (ASTELIN) 0.1 % nasal spray Place 1 spray into both nostrils in the morning. Use in each nostril as directed 30 mL 2   B-D UF III MINI PEN NEEDLES 31G X 5 MM MISC USE AS DIRECTED WITH LANTUS INJECTION 100 each 0   blood glucose meter  kit and supplies Dispense based on patient and insurance preference. Use up to four times daily as directed. (FOR ICD-10 E10.9, E11.9). 1 each 0   cholecalciferol (VITAMIN D3) 25 MCG (1000 UNIT) tablet Take 1,000 Units by mouth daily.     fenofibrate 160 MG tablet Take 1 tablet by mouth once daily 90 tablet 3   gabapentin (NEURONTIN) 100 MG capsule Take 100 mg by mouth daily.     glucose blood test strip Use as instructed 100 each 12   Insulin Pen Needle (PEN NEEDLES) 32G X 5 MM MISC Please use new needle after very Lantus injection. 100 each 3   LANTUS SOLOSTAR 100 UNIT/ML Solostar Pen INJECT 15 UNITS SUBCUTANEOUSLY ONCE DAILY AT NIGHT 15 mL 0    levothyroxine (SYNTHROID) 25 MCG tablet Take one pill daily for thyroid 90 tablet 3   magnesium oxide (MAG-OX) 400 MG tablet Take 400 mg by mouth 2 (two) times daily.     metFORMIN (GLUCOPHAGE) 1000 MG tablet Take 1 tablet (1,000 mg total) by mouth 2 (two) times daily with a meal. 180 tablet 3   Multiple Vitamin (MULTIVITAMIN WITH MINERALS) TABS tablet Take 1 tablet by mouth daily.     nystatin cream (MYCOSTATIN) Apply topically.     Omega 3 1000 MG CAPS Take 1,000 mg by mouth daily.     omeprazole (PRILOSEC) 20 MG capsule Take 1 capsule (20 mg total) by mouth daily. 90 capsule 3   OZEMPIC, 1 MG/DOSE, 4 MG/3ML SOPN INJECT 1MG  ONCE A WEEK 9 mL 0   Polyethylene Glycol 3350 (MIRALAX PO) Take by mouth every evening.     rosuvastatin (CRESTOR) 10 MG tablet Take 1 tablet (10 mg total) by mouth daily. 90 tablet 3   SYMBICORT 160-4.5 MCG/ACT inhaler Inhale 2 puffs into the lungs 2 (two) times daily. 11 g 11   traMADol (ULTRAM) 50 MG tablet Take 1 tablet by mouth every 6 (six) hours as needed.     triamcinolone cream (KENALOG) 0.1 % Apply topically 3 (three) times daily.     Vilazodone HCl 20 MG TABS Take 1 tablet (20 mg total) by mouth daily. 90 tablet 1   VITAMIN E PO Take by mouth.     amLODipine (NORVASC) 10 MG tablet Take 1 tablet (10 mg total) by mouth daily. 90 tablet 3   escitalopram (LEXAPRO) 10 MG tablet Take 1 tablet (10 mg total) by mouth at bedtime. 90 tablet 1   lisinopril (ZESTRIL) 5 MG tablet Take 5 mg by mouth daily.     losartan (COZAAR) 100 MG tablet Take 1 tablet (100 mg total) by mouth daily. 90 tablet 3   valsartan (DIOVAN) 160 MG tablet Take 160 mg by mouth daily.     metoprolol succinate (TOPROL-XL) 25 MG 24 hr tablet Take 1 tablet (25 mg total) by mouth daily. (Patient not taking: Reported on 07/12/2022) 90 tablet 3   fluticasone (FLONASE) 50 MCG/ACT nasal spray Place 2 sprays into both nostrils daily. (Patient not taking: Reported on 07/12/2022) 16 g 6   hydrOXYzine (VISTARIL)  25 MG capsule Take 1 capsule (25 mg total) by mouth every 8 (eight) hours as needed for itching. (Patient not taking: Reported on 07/12/2022) 30 capsule 0   montelukast (SINGULAIR) 10 MG tablet Take 1 tablet (10 mg total) by mouth at bedtime. (Patient not taking: Reported on 07/12/2022) 90 tablet 1   No facility-administered medications prior to visit.    Allergies  Allergen Reactions   Celebrex [Celecoxib]  Anaphylaxis   Glipizide Itching   Penicillins Other (See Comments)    "Burn marks from inside out"    Sulfa Antibiotics Itching   ROS neg/noncontributory except as noted HPI/below Legs cramp a lot      Objective:     BP 138/78   Pulse 76   Temp 97.6 F (36.4 C)   Ht 5' 3"  (1.6 m)   Wt 166 lb 12.8 oz (75.7 kg)   LMP  (LMP Unknown)   SpO2 96%   BMI 29.55 kg/m  Wt Readings from Last 3 Encounters:  07/12/22 166 lb 12.8 oz (75.7 kg)  02/26/22 164 lb 9.6 oz (74.7 kg)  02/06/22 162 lb 9.6 oz (73.8 kg)    Physical Exam   Gen: WDWN NAD WF HEENT: NCAT, conjunctiva not injected, sclera nonicteric NECK:  supple, no thyromegaly, no nodes, no carotid bruits CARDIAC:ir RRR, S1S2+, no murmur. DP 2+B LUNGS: CTAB. No wheezes ABDOMEN:  BS+, soft, NTND, No HSM, no masses EXT:  no edema-just ankles-?fat pads. MSK: no gross abnormalities.  NEURO: A&O x3.  CN II-XII intact.  PSYCH: normal mood. Good eye contact  YSH:UOHF 94.  PAC's.  No ST changes  Spent 49mn w/pt trying to get updates, meds, problems that are occurring, reviewing prev labs/records.      Assessment & Plan:   Problem List Items Addressed This Visit       Cardiovascular and Mediastinum   Hypertension associated with diabetes ([place]   Relevant Medications   losartan (COZAAR) 100 MG tablet     Endocrine   Acquired hypothyroidism   Relevant Orders   TSH (Completed)   Type 2 diabetes mellitus with insulin therapy (HCC)   Relevant Medications   losartan (COZAAR) 100 MG tablet   Other Relevant Orders    Comprehensive metabolic panel (Completed)   Hemoglobin A1c (Completed)   Lipid panel (Completed)   Other Visit Diagnoses     Palpitations    -  Primary   Relevant Orders   EKG 12-Lead (Completed)   Magnesium (Completed)   Iron deficiency anemia due to chronic blood loss       Relevant Orders   Vitamin B12 (Completed)   CBC with Differential/Platelet (Completed)   IBC + Ferritin (Completed)      Palpitations-some pvc's.  On metolprolol XL 511m  Will check CBC,cmp,tsh,mg to look for secondary causes.  F/u 1 wk HTN-chronic.  Not controlled  change lisinopril back to losartan 10030m Cont lasix 2m67metoprolol xl 50mg17mnitor.  F/u 1 wk. Hypothyroidism-chronic.  On levothyroxine 0.025mg.66meck tsh DM type 2-chronic.  On metformin and lantus-check A1C, lipids.  Iron def anemia-chronic.  Check cbc,iron,B12.   Meds ordered this encounter  Medications   losartan (COZAAR) 100 MG tablet    Sig: Take 1 tablet (100 mg total) by mouth daily.    Dispense:  90 tablet    Refill:  3    Brannan Cassedy M [place]

## 2022-07-16 ENCOUNTER — Ambulatory Visit: Payer: Medicare Other | Admitting: Podiatry

## 2022-07-19 ENCOUNTER — Ambulatory Visit (INDEPENDENT_AMBULATORY_CARE_PROVIDER_SITE_OTHER): Payer: Medicare Other | Admitting: Family Medicine

## 2022-07-19 ENCOUNTER — Encounter: Payer: Self-pay | Admitting: Family Medicine

## 2022-07-19 VITALS — BP 142/72 | HR 94 | Temp 97.7°F | Ht 63.0 in | Wt 170.1 lb

## 2022-07-19 DIAGNOSIS — J4540 Moderate persistent asthma, uncomplicated: Secondary | ICD-10-CM

## 2022-07-19 DIAGNOSIS — E1159 Type 2 diabetes mellitus with other circulatory complications: Secondary | ICD-10-CM

## 2022-07-19 DIAGNOSIS — R002 Palpitations: Secondary | ICD-10-CM | POA: Diagnosis not present

## 2022-07-19 DIAGNOSIS — I10 Essential (primary) hypertension: Secondary | ICD-10-CM | POA: Diagnosis not present

## 2022-07-19 MED ORDER — LEVOTHYROXINE SODIUM 25 MCG PO TABS: ORAL_TABLET | ORAL | 3 refills | Status: DC

## 2022-07-19 MED ORDER — METOPROLOL SUCCINATE ER 50 MG PO TB24: 75.0000 mg | ORAL_TABLET | Freq: Every day | ORAL | 3 refills | Status: DC

## 2022-07-19 NOTE — Patient Instructions (Addendum)
Metoprolol-'50mg'$  at bed and 1/2 tab in the am Referring to Card

## 2022-07-19 NOTE — Progress Notes (Signed)
Subjective:     Patient ID: Teresa Rice, female    DOB: March 10, 1946, 76 y.o.   MRN: [id number]  Chief Complaint  Patient presents with   Follow-up    1 week follow-up on palpitations     HPI  HTN-got 129/72 yesterday.  Running 130's/70's.  Back on Losartan.   If takes deep breath, feels like gets "stuck" upper chest and congested cough and stuffy nose for 3 days.  Seems to be in humidity.  No f/c.  No sob.   Some days, fine. Has seasonal allergies.  Uses symbicort bid.   Albuterol occ-helps.  Palpitations/irreg heart beat-walked yesterday and ok.  No caffeine. Taking metoprolol at HS.   There are no preventive care reminders to display for this patient.  Past Medical History:  Diagnosis Date   Bladder cancer [facility]) first dx 2012   Recurrent Bladder Cancer--  (urologist-  dr Diona Fanti)   Chronic constipation    Closed fracture of left distal radius 08/30/2016   Full dentures    GERD (gastroesophageal reflux disease)    Hyperlipidemia    Hypertension    Mild intermittent asthma    OA (osteoarthritis)    fingers    Trigger finger of both hands    wear slints and special gloves at night   Type 2 diabetes mellitus [facility])     Past Surgical History:  Procedure Laterality Date   BLADDER SURGERY  2012   in [place]   CYSTOSCOPY/RETROGRADE/URETEROSCOPY Left 05/24/2014   Procedure: CYSTOSCOPY/RETROGRADE/ URETEROSCOPY;  Surgeon: Jorja Loa, MD;  Location: [facility];  Service: Urology;  Laterality: Left;   TRANSURETHRAL RESECTION OF BLADDER TUMOR N/A 05/24/2014   Procedure: TRANSURETHRAL RESECTION OF BLADDER TUMOR (TURBT) ;  Surgeon: Jorja Loa, MD;  Location: [facility];  Service: Urology;  Laterality: N/A;   TRANSURETHRAL RESECTION OF BLADDER TUMOR N/A 08/04/2015   Procedure: TRANSURETHRAL RESECTION OF BLADDER TUMOR (TURBT);  Surgeon: Franchot Gallo, MD;  Location: [facility];  Service: Urology;  Laterality: N/A;     Outpatient Medications Prior to Visit  Medication Sig Dispense Refill   Accu-Chek Softclix Lancets lancets      albuterol (VENTOLIN HFA) 108 (90 Base) MCG/ACT inhaler Inhale 2 puffs into the lungs every 6 (six) hours as needed for wheezing or shortness of breath. 8 g 1   ALPRAZolam (XANAX) 1 MG tablet 1/2 tablet TID and 3/4 tablet at night 71 tablet 5   azelastine (ASTELIN) 0.1 % nasal spray Place 1 spray into both nostrils in the morning. Use in each nostril as directed 30 mL 2   B-D ULTRAFINE III SHORT PEN 31G X 8 MM MISC Inject into the skin.     blood glucose meter kit and supplies Dispense based on patient and insurance preference. Use up to four times daily as directed. (FOR ICD-10 E10.9, E11.9). 1 each 0   cholecalciferol (VITAMIN D3) 25 MCG (1000 UNIT) tablet Take 1,000 Units by mouth daily.     fenofibrate 160 MG tablet Take 1 tablet by mouth once daily 90 tablet 3   furosemide (LASIX) 20 MG tablet Take 20 mg by mouth daily as needed.     gabapentin (NEURONTIN) 100 MG capsule Take 100 mg by mouth daily.     glucose blood test strip Use as instructed 100 each 12   LANTUS SOLOSTAR 100 UNIT/ML Solostar Pen INJECT 15 UNITS SUBCUTANEOUSLY ONCE DAILY AT NIGHT 15 mL 0  levothyroxine (SYNTHROID) 25 MCG tablet Take one pill daily for thyroid 90 tablet 3   losartan (COZAAR) 100 MG tablet Take 1 tablet (100 mg total) by mouth daily. 90 tablet 3   magnesium oxide (MAG-OX) 400 MG tablet Take 400 mg by mouth 2 (two) times daily.     metFORMIN (GLUCOPHAGE) 1000 MG tablet Take 1 tablet (1,000 mg total) by mouth 2 (two) times daily with a meal. 180 tablet 3   metoprolol succinate (TOPROL-XL) 50 MG 24 hr tablet Take 50 mg by mouth at bedtime.     Multiple Vitamin (MULTIVITAMIN WITH MINERALS) TABS tablet Take 1 tablet by mouth daily.     nystatin cream (MYCOSTATIN) Apply topically.     Omega 3 1000 MG CAPS Take 1,000 mg by mouth daily.     omeprazole (PRILOSEC) 20 MG capsule Take 1 capsule (20 mg  total) by mouth daily. 90 capsule 3   OZEMPIC, 1 MG/DOSE, 4 MG/3ML SOPN INJECT 1MG  ONCE A WEEK 9 mL 0   Polyethylene Glycol 3350 (MIRALAX PO) Take by mouth every evening.     rosuvastatin (CRESTOR) 10 MG tablet Take 1 tablet (10 mg total) by mouth daily. 90 tablet 3   SYMBICORT 160-4.5 MCG/ACT inhaler Inhale 2 puffs into the lungs 2 (two) times daily. 11 g 11   traMADol (ULTRAM) 50 MG tablet Take 1 tablet by mouth every 6 (six) hours as needed.     triamcinolone cream (KENALOG) 0.1 % Apply topically 3 (three) times daily.     Vilazodone HCl 20 MG TABS Take 1 tablet (20 mg total) by mouth daily. 90 tablet 1   VITAMIN E PO Take by mouth.     B-D UF III MINI PEN NEEDLES 31G X 5 MM MISC USE AS DIRECTED WITH LANTUS INJECTION 100 each 0   Insulin Pen Needle (PEN NEEDLES) 32G X 5 MM MISC Please use new needle after very Lantus injection. 100 each 3   metoprolol succinate (TOPROL-XL) 25 MG 24 hr tablet Take 1 tablet (25 mg total) by mouth daily. (Patient not taking: Reported on 07/12/2022) 90 tablet 3   No facility-administered medications prior to visit.    Allergies  Allergen Reactions   Celebrex [Celecoxib] Anaphylaxis   Glipizide Itching   Penicillins Other (See Comments)    "Burn marks from inside out"    Sulfa Antibiotics Itching   ROS neg/noncontributory except as noted HPI/below      Objective:     BP (!) 142/72   Pulse 94   Temp 97.7 F (36.5 C) (Temporal)   Ht 5' 3"  (1.6 m)   Wt 170 lb 2 oz (77.2 kg)   LMP  (LMP Unknown)   SpO2 95%   BMI 30.14 kg/m  Wt Readings from Last 3 Encounters:  07/19/22 170 lb 2 oz (77.2 kg)  07/12/22 166 lb 12.8 oz (75.7 kg)  02/26/22 164 lb 9.6 oz (74.7 kg)    Physical Exam   Gen: WDWN NAD HEENT: NCAT, conjunctiva not injected, sclera nonicteric NECK:  supple, no thyromegaly, no nodes, no carotid bruits CARDIAC: irRRR, S1S2+, no murmur.LUNGS: CTAB. No wheezes EXT:  no edema MSK: no gross abnormalities.  NEURO: A&O x3.  CN II-XII  intact.  PSYCH: normal mood. Good eye contact     Assessment & Plan:   Problem List Items Addressed This Visit       Cardiovascular and Mediastinum   Hypertension associated with diabetes ([place]) - Primary   Relevant Medications  metoprolol succinate (TOPROL-XL) 50 MG 24 hr tablet   furosemide (LASIX) 20 MG tablet     Respiratory   Moderate persistent asthma without complication   Other Visit Diagnoses     Palpitations          HTN-chronic.  Better controlled but still a little room for improvement.  Will inc toprol to 64m daily Palpitations/irreg heart beat-?cardiac/lungs/other.  Refer Card  inc metoprolol to 750md.  Mg was a little low so on it.  Asthma-cheonic.  Not ideal some days, can do albuterol then.  Cont symbicort F/u 1 mo  No orders of the defined types were placed in this encounter.   AnWellington HampshireMD

## 2022-07-28 DIAGNOSIS — E1165 Type 2 diabetes mellitus with hyperglycemia: Secondary | ICD-10-CM | POA: Diagnosis not present

## 2022-08-01 NOTE — Progress Notes (Deleted)
[facility] [place]   Reason for referral: Swollen *** leg  History of Present Illness  VIRGILENE Rice is a 76 y.o. female who presents with chief complaint: swollen leg.  Patient notes, onset of swelling *** months ago, associated with ***.  The patient has had *** history of DVT, *** history of varicose vein, *** history of venous stasis ulcers, *** history of  Lymphedema and *** history of skin changes in lower legs.  There is *** family history of venous disorders.  The patient has *** used compression stockings in the past.  Past Medical History:  Diagnosis Date   Bladder cancer ([facility]) first dx 2012   Recurrent Bladder Cancer--  (urologist-  dr Diona Fanti)   Chronic constipation    Closed fracture of left distal radius 08/30/2016   Full dentures    GERD (gastroesophageal reflux disease)    Hyperlipidemia    Hypertension    Mild intermittent asthma    OA (osteoarthritis)    fingers    Trigger finger of both hands    wear slints and special gloves at night   Type 2 diabetes mellitus [facility])     Past Surgical History:  Procedure Laterality Date   BLADDER SURGERY  2012   in [place]   CYSTOSCOPY/RETROGRADE/URETEROSCOPY Left 05/24/2014   Procedure: CYSTOSCOPY/RETROGRADE/ URETEROSCOPY;  Surgeon: Jorja Loa, MD;  [facility];  Service: Urology;  Laterality: Left;   TRANSURETHRAL RESECTION OF BLADDER TUMOR N/A 05/24/2014   Procedure: TRANSURETHRAL RESECTION OF BLADDER TUMOR (TURBT) ;  Surgeon: Jorja Loa, MD;  [facility];  Service: Urology;  Laterality: N/A;   TRANSURETHRAL RESECTION OF BLADDER TUMOR N/A 08/04/2015   Procedure: TRANSURETHRAL RESECTION OF BLADDER TUMOR (TURBT);  Surgeon: Franchot Gallo, MD;  Location: [facility];  Service: Urology;  Laterality: N/A;    Social History   Socioeconomic History   Marital status: Widowed    Spouse name: Not on file   Number of children:  1   Years of education: 103   Highest education level: Not on file  Occupational History   Occupation: retired  Tobacco Use   Smoking status: Former    Years: 30.00    Types: Cigarettes    Quit date: 05/21/1994    Years since quitting: 28.2   Smokeless tobacco: Never  Vaping Use   Vaping Use: Never used  Substance and Sexual Activity   Alcohol use: Yes    Comment: RARE   Drug use: No   Sexual activity: Not on file  Other Topics Concern   Not on file  Social History Narrative   Lives in an apartment, lives alone   One daughter   Right handed    Retired from [employer] work   Highest level of education:  GED   Social Determinants of [place] Strain: [place]  (12/15/2020)   Overall Financial Resource Strain (CARDIA)    Difficulty of Paying Living Expenses: Not hard at [place]: No [place] (12/15/2020)   Hunger Vital Sign    Worried About Running Out of Food in the Last Year: Never true    [place] in the Last Year: Never true  Transportation Needs: No Transportation Needs (12/15/2020)   PRAPARE - Hydrologist (Medical): No    Lack of Transportation (Non-Medical): No  Physical Activity: Inactive (12/15/2020)  Exercise Vital Sign    Days of Exercise per Week: 0 days    Minutes of Exercise per Session: 0 min  Stress: No Stress Concern Present (12/15/2020)   [name]    Feeling of Stress : Only a little  Social Connections: Moderately Isolated (12/15/2020)   Social Connection and Isolation Panel [NHANES]    Frequency of Communication with Friends and Family: More than three times a week    Frequency of Social Gatherings with Friends and Family: Never    Attends Religious Services: More than 4 times per year    Active Member of [employer] or Organizations: No    Attends Archivist Meetings: Never    Marital Status: Widowed  Intimate  Partner Violence: Not At Risk (12/15/2020)   Humiliation, Afraid, Rape, and Kick questionnaire    Fear of Current or Ex-Partner: No    Emotionally Abused: No    Physically Abused: No    Sexually Abused: No    Family History  Problem Relation Age of Onset   Early death Mother    AAA (abdominal aortic aneurysm) Sister    Cancer Father    Diabetes Brother    Cancer Brother     Current Outpatient Medications on File Prior to Visit  Medication Sig Dispense Refill   Accu-Chek Softclix Lancets lancets      albuterol (VENTOLIN HFA) 108 (90 Base) MCG/ACT inhaler Inhale 2 puffs into the lungs every 6 (six) hours as needed for wheezing or shortness of breath. 8 g 1   ALPRAZolam (XANAX) 1 MG tablet 1/2 tablet TID and 3/4 tablet at night 71 tablet 5   azelastine (ASTELIN) 0.1 % nasal spray Place 1 spray into both nostrils in the morning. Use in each nostril as directed 30 mL 2   B-D ULTRAFINE III SHORT PEN 31G X 8 MM MISC Inject into the skin.     blood glucose meter kit and supplies Dispense based on patient and insurance preference. Use up to four times daily as directed. (FOR ICD-10 E10.9, E11.9). 1 each 0   cholecalciferol (VITAMIN D3) 25 MCG (1000 UNIT) tablet Take 1,000 Units by mouth daily.     fenofibrate 160 MG tablet Take 1 tablet by mouth once daily 90 tablet 3   furosemide (LASIX) 20 MG tablet Take 20 mg by mouth daily as needed.     gabapentin (NEURONTIN) 100 MG capsule Take 100 mg by mouth daily.     glucose blood test strip Use as instructed 100 each 12   LANTUS SOLOSTAR 100 UNIT/ML Solostar Pen INJECT 15 UNITS SUBCUTANEOUSLY ONCE DAILY AT NIGHT 15 mL 0   levothyroxine (SYNTHROID) 25 MCG tablet Take one pill daily for thyroid 90 tablet 3   losartan (COZAAR) 100 MG tablet Take 1 tablet (100 mg total) by mouth daily. 90 tablet 3   magnesium oxide (MAG-OX) 400 MG tablet Take 400 mg by mouth 2 (two) times daily.     metFORMIN (GLUCOPHAGE) 1000 MG tablet Take 1 tablet (1,000 mg total)  by mouth 2 (two) times daily with a meal. 180 tablet 3   metoprolol succinate (TOPROL-XL) 50 MG 24 hr tablet Take 1.5 tablets (75 mg total) by mouth daily. Take with or immediately following a meal. 135 tablet 3   Multiple Vitamin (MULTIVITAMIN WITH MINERALS) TABS tablet Take 1 tablet by mouth daily.     nystatin cream (MYCOSTATIN) Apply topically.     Omega 3 1000 MG  CAPS Take 1,000 mg by mouth daily.     omeprazole (PRILOSEC) 20 MG capsule Take 1 capsule (20 mg total) by mouth daily. 90 capsule 3   OZEMPIC, 1 MG/DOSE, 4 MG/3ML SOPN INJECT 1MG  ONCE A WEEK 9 mL 0   Polyethylene Glycol 3350 (MIRALAX PO) Take by mouth every evening.     rosuvastatin (CRESTOR) 10 MG tablet Take 1 tablet (10 mg total) by mouth daily. 90 tablet 3   SYMBICORT 160-4.5 MCG/ACT inhaler Inhale 2 puffs into the lungs 2 (two) times daily. 11 g 11   traMADol (ULTRAM) 50 MG tablet Take 1 tablet by mouth every 6 (six) hours as needed.     triamcinolone cream (KENALOG) 0.1 % Apply topically 3 (three) times daily.     Vilazodone HCl 20 MG TABS Take 1 tablet (20 mg total) by mouth daily. 90 tablet 1   VITAMIN E PO Take by mouth.     No current facility-administered medications on file prior to visit.    Allergies as of 08/02/2022 - Review Complete 07/19/2022  Allergen Reaction Noted   Celebrex [celecoxib] Anaphylaxis 05/21/2014   Glipizide Itching 08/02/2015   Penicillins Other (See Comments) 05/21/2014   Sulfa antibiotics Itching 05/21/2014     ROS:   General:  No weight loss, Fever, chills  HEENT: No recent headaches, no nasal bleeding, no visual changes, no sore throat  Neurologic: No dizziness, blackouts, seizures. No recent symptoms of stroke or mini- stroke. No recent episodes of slurred speech, or temporary blindness.  Cardiac: No recent episodes of chest pain/pressure, no shortness of breath at rest.  No shortness of breath with exertion.  Denies history of atrial fibrillation or irregular  heartbeat  Vascular: No history of rest pain in feet.  No history of claudication.  No history of non-healing ulcer, No history of DVT   Pulmonary: No home oxygen, no productive cough, no hemoptysis,  No asthma or wheezing  Musculoskeletal:  _0  Arthritis, _1  Low back pain,  _2  Joint pain  Hematologic:No history of hypercoagulable state.  No history of easy bleeding.  No history of anemia  Gastrointestinal: No hematochezia or melena,  No gastroesophageal reflux, no trouble swallowing  Urinary: _3  chronic Kidney disease, _4  on HD - _5  MWF or _6  TTHS, _7  Burning with urination, _8  Frequent urination, _9  Difficulty urinating;   Skin: No rashes  Psychological: No history of anxiety,  No history of depression  Physical Examination  There were no vitals filed for this visit.  There is no height or weight on file to calculate BMI.  General:  Alert and oriented, no acute distress HEENT: Normal Neck: No bruit or JVD Pulmonary: Clear to auscultation bilaterally Cardiac: Regular Rate and Rhythm without murmur Abdomen: Soft, non-tender, non-distended, no mass, no scars Skin: No rash Extremity Pulses:  2+ radial, brachial, femoral, dorsalis pedis, posterior tibial pulses bilaterally Musculoskeletal: No deformity or edema  Neurologic: Upper and lower extremity motor 5/5 and symmetric  DATA:  Assessment:  Plan:  Roxy Horseman PA-C [facility] [place] Office: [phone]  MD in clinic [place]

## 2022-08-02 ENCOUNTER — Ambulatory Visit (HOSPITAL_COMMUNITY): Payer: Medicare Other | Attending: Vascular Surgery

## 2022-08-02 ENCOUNTER — Encounter (HOSPITAL_COMMUNITY): Payer: Self-pay

## 2022-08-02 ENCOUNTER — Encounter (HOSPITAL_COMMUNITY): Payer: Medicare Other

## 2022-08-09 ENCOUNTER — Ambulatory Visit (INDEPENDENT_AMBULATORY_CARE_PROVIDER_SITE_OTHER): Payer: Medicare Other

## 2022-08-09 ENCOUNTER — Ambulatory Visit: Payer: Medicare Other | Admitting: Family Medicine

## 2022-08-09 DIAGNOSIS — E119 Type 2 diabetes mellitus without complications: Secondary | ICD-10-CM

## 2022-08-09 DIAGNOSIS — Z794 Long term (current) use of insulin: Secondary | ICD-10-CM

## 2022-08-09 NOTE — Progress Notes (Signed)
Placed free style libre sensor in the posterior side of upper left arm and assisted her with instructions on how to use the monitor. Patient understood instructions. Joanette Gula, CMA

## 2022-08-16 ENCOUNTER — Encounter: Payer: Self-pay | Admitting: Family Medicine

## 2022-08-16 ENCOUNTER — Ambulatory Visit (INDEPENDENT_AMBULATORY_CARE_PROVIDER_SITE_OTHER): Payer: Medicare Other | Admitting: Family Medicine

## 2022-08-16 VITALS — BP 134/68 | HR 88 | Temp 97.9°F | Resp 14 | Ht 63.0 in | Wt 169.0 lb

## 2022-08-16 DIAGNOSIS — E119 Type 2 diabetes mellitus without complications: Secondary | ICD-10-CM | POA: Diagnosis not present

## 2022-08-16 DIAGNOSIS — I152 Hypertension secondary to endocrine disorders: Secondary | ICD-10-CM | POA: Diagnosis not present

## 2022-08-16 DIAGNOSIS — E1159 Type 2 diabetes mellitus with other circulatory complications: Secondary | ICD-10-CM | POA: Diagnosis not present

## 2022-08-16 DIAGNOSIS — J4540 Moderate persistent asthma, uncomplicated: Secondary | ICD-10-CM

## 2022-08-16 DIAGNOSIS — Z794 Long term (current) use of insulin: Secondary | ICD-10-CM | POA: Diagnosis not present

## 2022-08-16 MED ORDER — OZEMPIC (2 MG/DOSE) 8 MG/3ML ~~LOC~~ SOPN: 2.0000 mg | PEN_INJECTOR | SUBCUTANEOUS | 5 refills | Status: DC

## 2022-08-16 MED ORDER — ALBUTEROL SULFATE HFA 108 (90 BASE) MCG/ACT IN AERS: 2.0000 | INHALATION_SPRAY | Freq: Four times a day (QID) | RESPIRATORY_TRACT | 1 refills | Status: DC | PRN

## 2022-08-16 NOTE — Patient Instructions (Signed)
It was very nice to see you today!  Increase ozempic to '2mg'$ .    PLEASE NOTE:  If you had any lab tests please let us know if you have not heard back within a few days. You may see your results on MyChart before we have a chance to review them but we will give you a call once they are reviewed by [place]. If we ordered any referrals today, please let us know if you have not heard from their office within the next week.   Please try these tips to maintain a healthy lifestyle:  Eat most of your calories during the day when you are active. Eliminate processed foods including packaged sweets (pies, cakes, cookies), reduce intake of potatoes, white bread, white pasta, and white rice. Look for whole grain options, oat flour or almond flour.  Each meal should contain half fruits/vegetables, one quarter protein, and one quarter carbs (no bigger than a computer mouse).  Cut down on sweet beverages. This includes juice, soda, and sweet tea. Also watch fruit intake, though this is a healthier sweet option, it still contains natural sugar! Limit to 3 servings daily.  Drink at least 1 glass of water with each meal and aim for at least 8 glasses per day  Exercise at least 150 minutes every week.

## 2022-08-16 NOTE — Progress Notes (Signed)
Subjective:     Patient ID: Teresa Rice, female    DOB: Oct 30, 1946, 76 y.o.   MRN: [id number]  Chief Complaint  Patient presents with   Hypertension    The metoprolol succinate makes her sleepy, wants to change the way she takes it (how many).   Four week follow-up   Diabetes    Today: 189 (she ate breakfast today).    HPI  HTN-metoprolol 64m. makes too drowsy takes w/Xanax. When on amlodipine in past-foot swelled, no not taking.  On losartan 100.  Not checking bp as glucose monitor on L arm.  Used R arm once and was 149/72.  HR high freq.  109 right now sitting in office.  Seeing card in Oct.  DM-has CGM now.  No lows.  2 hrs after meal-180's.  Doesn't eat much.  Wants to exercise.  There are no preventive care reminders to display for this patient.  Past Medical History:  Diagnosis Date   Bladder cancer ([facility] first dx 2012   Recurrent Bladder Cancer--  (urologist-  dr dDiona Fanti   Chronic constipation    Closed fracture of left distal radius 08/30/2016   Full dentures    GERD (gastroesophageal reflux disease)    Hyperlipidemia    Hypertension    Mild intermittent asthma    OA (osteoarthritis)    fingers    Trigger finger of both hands    wear slints and special gloves at night   Type 2 diabetes mellitus ([facility]     Past Surgical History:  Procedure Laterality Date   [place]AD[place]  2012   [place]  CYSTOSCOPY/RETROGRADE/URETEROSCOPY Left 05/24/2014   Procedure: CYSTOSCOPY/RETROGRADE/ URETEROSCOPY;  Surgeon: SJorja Loa MD;  Location: [facility];  Service: Urology;  Laterality: Left;   TRANSURETHRAL RESECTION OF BLADDER TUMOR N/A 05/24/2014   Procedure: TRANSURETHRAL RESECTION OF BLADDER TUMOR (TURBT) ;  Surgeon: SJorja Loa MD;  Location: [facility];  Service: Urology;  Laterality: N/A;   TRANSURETHRAL RESECTION OF BLADDER TUMOR N/A 08/04/2015   Procedure: TRANSURETHRAL RESECTION OF BLADDER TUMOR (TURBT);  Surgeon:  SFranchot Gallo MD;  [facility]  Service: Urology;  Laterality: N/A;    Outpatient Medications Prior to Visit  Medication Sig Dispense Refill   Accu-Chek Softclix Lancets lancets      ALPRAZolam (XANAX) 1 MG tablet 1/2 tablet TID and 3/4 tablet at night 71 tablet 5   azelastine (ASTELIN) 0.1 % nasal spray Place 1 spray into both nostrils in the morning. Use in each nostril as directed 30 mL 2   B-D ULTRAFINE III SHORT PEN 31G X 8 MM MISC Inject into the skin.     blood glucose meter kit and supplies Dispense based on patient and insurance preference. Use up to four times daily as directed. (FOR ICD-10 E10.9, E11.9). 1 each 0   cholecalciferol (VITAMIN D3) 25 MCG (1000 UNIT) tablet Take 1,000 Units by mouth daily.     fenofibrate 160 MG tablet Take 1 tablet by mouth once daily 90 tablet 3   furosemide (LASIX) 20 MG tablet Take 20 mg by mouth daily as needed.     gabapentin (NEURONTIN) 100 MG capsule Take 100 mg by mouth daily.     glucose blood test strip Use as instructed 100 each 12   LANTUS SOLOSTAR 100 UNIT/ML Solostar Pen INJECT 15 UNITS SUBCUTANEOUSLY ONCE DAILY AT NIGHT 15 mL 0   levothyroxine (SYNTHROID)  25 MCG tablet Take one pill daily for thyroid 90 tablet 3   losartan (COZAAR) 100 MG tablet Take 1 tablet (100 mg total) by mouth daily. 90 tablet 3   magnesium oxide (MAG-OX) 400 MG tablet Take 400 mg by mouth 2 (two) times daily.     metFORMIN (GLUCOPHAGE) 1000 MG tablet Take 1 tablet (1,000 mg total) by mouth 2 (two) times daily with a meal. 180 tablet 3   metoprolol succinate (TOPROL-XL) 50 MG 24 hr tablet Take 1.5 tablets (75 mg total) by mouth daily. Take with or immediately following a meal. 135 tablet 3   Multiple Vitamin (MULTIVITAMIN WITH MINERALS) TABS tablet Take 1 tablet by mouth daily.     nystatin cream (MYCOSTATIN) Apply topically.     Omega 3 1000 MG CAPS Take 1,000 mg by mouth daily.     omeprazole (PRILOSEC) 20 MG capsule Take 1 capsule  (20 mg total) by mouth daily. 90 capsule 3   Polyethylene Glycol 3350 (MIRALAX PO) Take by mouth every evening.     rosuvastatin (CRESTOR) 10 MG tablet Take 1 tablet (10 mg total) by mouth daily. 90 tablet 3   SYMBICORT 160-4.5 MCG/ACT inhaler Inhale 2 puffs into the lungs 2 (two) times daily. 11 g 11   traMADol (ULTRAM) 50 MG tablet Take 1 tablet by mouth every 6 (six) hours as needed.     triamcinolone cream (KENALOG) 0.1 % Apply topically 3 (three) times daily.     Vilazodone HCl 20 MG TABS Take 1 tablet (20 mg total) by mouth daily. 90 tablet 1   VITAMIN E PO Take by mouth.     albuterol (VENTOLIN HFA) 108 (90 Base) MCG/ACT inhaler Inhale 2 puffs into the lungs every 6 (six) hours as needed for wheezing or shortness of breath. 8 g 1   OZEMPIC, 1 MG/DOSE, 4 MG/3ML SOPN INJECT 1MG  ONCE A WEEK 9 mL 0   No facility-administered medications prior to visit.    Allergies  Allergen Reactions   Celebrex [Celecoxib] Anaphylaxis   Glipizide Itching   Penicillins Other (See Comments)    "Burn marks from inside out"    Sulfa Antibiotics Itching   ROS neg/noncontributory except as noted HPI/below Needs refill albuterol      Objective:     BP 134/68 (BP Location: Right Arm, Patient Position: Sitting)   Pulse 88   Temp 97.9 F (36.6 C) (Temporal)   Resp 14   Ht 5' 3"  (1.6 m)   Wt 169 lb (76.7 kg)   LMP  (LMP Unknown)   SpO2 94%   BMI 29.94 kg/m  Wt Readings from Last 3 Encounters:  08/16/22 169 lb (76.7 kg)  07/19/22 170 lb 2 oz (77.2 kg)  07/12/22 166 lb 12.8 oz (75.7 kg)    Physical Exam   Gen: WDWN NAD HEENT: NCAT, conjunctiva not injected, sclera nonicteric NECK:  supple, no thyromegaly, no nodes, no carotid bruits CARDIAC: RRR, S1S2+, no murmur. DP 2+B LUNGS: CTAB. No wheezes EXT:  no edema MSK: no gross abnormalities.  NEURO: A&O x3.  CN II-XII intact.  PSYCH: normal mood. Good eye contact     Assessment & Plan:   Problem List Items Addressed This Visit        Cardiovascular and Mediastinum   Hypertension associated with diabetes ([place]) - Primary   Relevant Medications   Semaglutide, 2 MG/DOSE, (OZEMPIC, 2 MG/DOSE,) 8 MG/3ML SOPN     Respiratory   Moderate persistent asthma without complication  Relevant Medications   albuterol (VENTOLIN HFA) 108 (90 Base) MCG/ACT inhaler     Endocrine   Type 2 diabetes mellitus with insulin therapy ([facility])   Relevant Medications   Semaglutide, 2 MG/DOSE, (OZEMPIC, 2 MG/DOSE,) 8 MG/3ML SOPN  1.  Hypertension-chronic.  Controlled.  Continue metoprolol 75 mg daily, losartan 100 mg daily, Lasix 20 mg as needed daily.  Since she is feeling a little more fatigue on the larger dose, advised that she can split the dose to twice daily.  Follow-up 3 months 2.  Irregular heartbeat-pulse goes up and down.  She is on metoprolol 75 mg daily.  Continue.  She does have an appointment upcoming with cardiology. 3.  Type 2 diabetes-chronic.  Not quite controlled.  She does have a continuous glucose monitor now.  Notices it is elevated 2 hours after meal.  Advised to monitor if any specific foods are making it go up.  Continue working on diet.  Start exercise as tolerated.  Will increase Ozempic to 2 mg daily.  Discussed with her that this is what is suppressing her appetite.  She was unaware that that was what was doing it.  Follow-up in 3 months 4.  Asthma-chronic.  Controlled.  Requesting refill of albuterol inhaler.  Meds ordered this encounter  Medications   Semaglutide, 2 MG/DOSE, (OZEMPIC, 2 MG/DOSE,) 8 MG/3ML SOPN    Sig: Inject 2 mg into the skin once a week.    Dispense:  3 mL    Refill:  5   albuterol (VENTOLIN HFA) 108 (90 Base) MCG/ACT inhaler    Sig: Inhale 2 puffs into the lungs every 6 (six) hours as needed for wheezing or shortness of breath.    Dispense:  8 g    Refill:  1    Wellington Hampshire, MD

## 2022-08-20 ENCOUNTER — Other Ambulatory Visit: Payer: Self-pay

## 2022-08-20 DIAGNOSIS — E119 Type 2 diabetes mellitus without complications: Secondary | ICD-10-CM

## 2022-08-22 ENCOUNTER — Other Ambulatory Visit: Payer: Self-pay | Admitting: Podiatry

## 2022-08-22 DIAGNOSIS — M7741 Metatarsalgia, right foot: Secondary | ICD-10-CM

## 2022-08-23 ENCOUNTER — Ambulatory Visit (INDEPENDENT_AMBULATORY_CARE_PROVIDER_SITE_OTHER): Payer: Medicare Other | Admitting: Physician Assistant

## 2022-08-23 ENCOUNTER — Encounter: Payer: Self-pay | Admitting: Physician Assistant

## 2022-08-23 VITALS — BP 130/70 | HR 88 | Temp 97.5°F | Ht 63.0 in | Wt 169.0 lb

## 2022-08-23 DIAGNOSIS — R590 Localized enlarged lymph nodes: Secondary | ICD-10-CM | POA: Diagnosis not present

## 2022-08-23 DIAGNOSIS — M542 Cervicalgia: Secondary | ICD-10-CM | POA: Diagnosis not present

## 2022-08-23 MED ORDER — BACLOFEN 10 MG PO TABS: 5.0000 mg | ORAL_TABLET | Freq: Two times a day (BID) | ORAL | 0 refills | Status: DC | PRN

## 2022-08-23 NOTE — Progress Notes (Signed)
Teresa Rice is a 76 y.o. female here for a follow up of a pre-existing problem.  History of Present Illness:   Chief Complaint  Patient presents with   Neck Pain    Pt c/o neck pain and spasms, also swollen gland left side, x several days.    Neck Pain     Neck pain On Sunday, patient fell asleep on her couch. When she woke up her neck was very sore. Gets muscle spasms in her neck often. Has never seen anyone for this but prior PCP, [place]. Symptoms have overall resolved at this point.  LAD Developed a lump on the left side of her neck a few days but it went down. Still present and very tender. Was put on antibiotics when this happened in the past. Denies: sick contacts, fevers, chills, cough, ear pain, unusual runny nose (has hx of allergies) Takes zyrtec regularly  Past Medical History:  Diagnosis Date   Bladder cancer ([place]) first dx 2012   Recurrent Bladder Cancer--  (urologist-  dr Diona Fanti)   Chronic constipation    Closed fracture of left distal radius 08/30/2016   Full dentures    GERD (gastroesophageal reflux disease)    Hyperlipidemia    Hypertension    Mild intermittent asthma    OA (osteoarthritis)    fingers    Trigger finger of both hands    wear slints and special gloves at night   Type 2 diabetes mellitus ([place])      Social History   Tobacco Use   Smoking status: Former    Years: 30.00    Types: Cigarettes    Quit date: 05/21/1994    Years since quitting: 28.2   Smokeless tobacco: Never  Vaping Use   Vaping Use: Never used  Substance Use Topics   Alcohol use: Yes    Comment: RARE   Drug use: No    Past Surgical History:  Procedure Laterality Date   BLADDER SURGERY  2012   in [place]   CYSTOSCOPY/RETROGRADE/URETEROSCOPY Left 05/24/2014   Procedure: CYSTOSCOPY/RETROGRADE/ URETEROSCOPY;  Surgeon: Jorja Loa, MD;  [facility]: [facility];  Service: Urology;  Laterality: Left;   TRANSURETHRAL  RESECTION OF BLADDER TUMOR N/A 05/24/2014   Procedure: TRANSURETHRAL RESECTION OF BLADDER TUMOR (TURBT) ;  Surgeon: Jorja Loa, MD;  Location: [facility];  Service: Urology;  Laterality: N/A;   TRANSURETHRAL RESECTION OF BLADDER TUMOR N/A 08/04/2015   Procedure: TRANSURETHRAL RESECTION OF BLADDER TUMOR (TURBT);  Surgeon: Franchot Gallo, MD;  [facility];  Service: Urology;  Laterality: N/A;    Family History  Problem Relation Age of Onset   Early death Mother    AAA (abdominal aortic aneurysm) Sister    Cancer Father    Diabetes Brother    Cancer Brother     Allergies  Allergen Reactions   Celebrex [Celecoxib] Anaphylaxis   Glipizide Itching   Penicillins Other (See Comments)    "Burn marks from inside out"    Sulfa Antibiotics Itching    Current Medications:   Current Outpatient Medications:    Accu-Chek Softclix Lancets lancets, , Disp: , Rfl:    albuterol (VENTOLIN HFA) 108 (90 Base) MCG/ACT inhaler, Inhale 2 puffs into the lungs every 6 (six) hours as needed for wheezing or shortness of breath., Disp: 8 g, Rfl: 1   ALPRAZolam (XANAX) 1 MG tablet, 1/2 tablet TID and 3/4 tablet at night, Disp: 71 tablet,  Rfl: 5   azelastine (ASTELIN) 0.1 % nasal spray, Place 1 spray into both nostrils in the morning. Use in each nostril as directed, Disp: 30 mL, Rfl: 2   B-D ULTRAFINE III SHORT PEN 31G X 8 MM MISC, Inject into the skin., Disp: , Rfl:    blood glucose meter kit and supplies, Dispense based on patient and insurance preference. Use up to four times daily as directed. (FOR ICD-10 E10.9, E11.9)., Disp: 1 each, Rfl: 0   Calcium Carbonate (CALCIUM 600 PO), Take 1 tablet by mouth daily in the afternoon., Disp: , Rfl:    cholecalciferol (VITAMIN D3) 25 MCG (1000 UNIT) tablet, Take 1,000 Units by mouth daily., Disp: , Rfl:    fenofibrate 160 MG tablet, Take 1 tablet by mouth once daily, Disp: 90 tablet, Rfl: 3   furosemide (LASIX) 20 MG tablet, Take 20 mg by  mouth daily as needed., Disp: , Rfl:    gabapentin (NEURONTIN) 100 MG capsule, Take 100 mg by mouth daily., Disp: , Rfl:    glucose blood test strip, Use as instructed, Disp: 100 each, Rfl: 12   LANTUS SOLOSTAR 100 UNIT/ML Solostar Pen, INJECT 15 UNITS SUBCUTANEOUSLY ONCE DAILY AT NIGHT, Disp: 15 mL, Rfl: 0   levothyroxine (SYNTHROID) 25 MCG tablet, Take one pill daily for thyroid, Disp: 90 tablet, Rfl: 3   losartan (COZAAR) 100 MG tablet, Take 1 tablet (100 mg total) by mouth daily., Disp: 90 tablet, Rfl: 3   magnesium oxide (MAG-OX) 400 MG tablet, Take 400 mg by mouth 2 (two) times daily., Disp: , Rfl:    metFORMIN (GLUCOPHAGE) 1000 MG tablet, Take 1 tablet (1,000 mg total) by mouth 2 (two) times daily with a meal., Disp: 180 tablet, Rfl: 3   metoprolol succinate (TOPROL-XL) 50 MG 24 hr tablet, Take 1.5 tablets (75 mg total) by mouth daily. Take with or immediately following a meal., Disp: 135 tablet, Rfl: 3   Multiple Vitamin (MULTIVITAMIN WITH MINERALS) TABS tablet, Take 1 tablet by mouth daily., Disp: , Rfl:    nystatin cream (MYCOSTATIN), Apply topically., Disp: , Rfl:    Omega 3 1000 MG CAPS, Take 1,000 mg by mouth daily., Disp: , Rfl:    omeprazole (PRILOSEC) 20 MG capsule, Take 1 capsule (20 mg total) by mouth daily., Disp: 90 capsule, Rfl: 3   Polyethylene Glycol 3350 (MIRALAX PO), Take by mouth every evening., Disp: , Rfl:    rosuvastatin (CRESTOR) 10 MG tablet, Take 1 tablet (10 mg total) by mouth daily., Disp: 90 tablet, Rfl: 3   Semaglutide, 2 MG/DOSE, (OZEMPIC, 2 MG/DOSE,) 8 MG/3ML SOPN, Inject 2 mg into the skin once a week., Disp: 3 mL, Rfl: 5   SYMBICORT 160-4.5 MCG/ACT inhaler, Inhale 2 puffs into the lungs 2 (two) times daily., Disp: 11 g, Rfl: 11   traMADol (ULTRAM) 50 MG tablet, Take 1 tablet by mouth every 6 (six) hours as needed., Disp: , Rfl:    triamcinolone cream (KENALOG) 0.1 %, Apply topically 3 (three) times daily., Disp: , Rfl:    Vilazodone HCl 20 MG TABS, Take 1  tablet (20 mg total) by mouth daily., Disp: 90 tablet, Rfl: 1   VITAMIN E PO, Take by mouth., Disp: , Rfl:    Review of Systems:   Review of Systems  Musculoskeletal:  Positive for neck pain.   Negative unless otherwise specified per HPI.  Vitals:   Vitals:   08/23/22 1130  BP: 130/70  Pulse: 88  Temp: (!) 97.5 F (36.4  C)  TempSrc: Temporal  SpO2: 96%  Weight: 169 lb (76.7 kg)  Height: _0  (1.6 m)     Body mass index is 29.94 kg/m.  Physical Exam:   Physical Exam Vitals and nursing note reviewed.  Constitutional:      General: She is not in acute distress.    Appearance: She is well-developed. She is not ill-appearing or toxic-appearing.  Cardiovascular:     Rate and Rhythm: Normal rate and regular rhythm.     Pulses: Normal pulses.     Heart sounds: Normal heart sounds, S1 normal and S2 normal.  Pulmonary:     Effort: Pulmonary effort is normal.     Breath sounds: Normal breath sounds.  Musculoskeletal:     Comments: No decreased ROM of neck No cervical spine tenderness  Lymphadenopathy:     Cervical: Cervical adenopathy present.     Left cervical: Posterior cervical adenopathy present.  Skin:    General: Skin is warm and dry.  Neurological:     Mental Status: She is alert.     GCS: GCS eye subscore is 4. GCS verbal subscore is 5. GCS motor subscore is 6.  Psychiatric:        Speech: Speech normal.        Behavior: Behavior normal. Behavior is cooperative.     Assessment and Plan:   Neck pain No red flags Sx overall resolved Advised as follows: --Consider aleve --Consider physical therapy here in our office, just call us for referral --Send me a mychart message if you want me to advise on your "muscle cramp pills" --Trial low dose baclofen (muscle relaxer that I'm prescribing)  Left cervical lymphadenopathy No red flags Sx improved Advised as follows: --May continue aleve --If it does not go away after a full month, please reach out to  [place] --If you develop any upper respiratory infection symptoms, please reach out to [place]    Inda Coke, PA-C

## 2022-08-23 NOTE — Patient Instructions (Signed)
It was great to see you!  For your painful lymph-node: --May continue aleve --If it does not go away after a full month, please reach out to [place] --If you develop any upper respiratory infection symptoms, please reach out to [place]  For your neck pain: --Consider aleve --Consider physical therapy here in our office, just call us for referral --Send me a mychart message if you want me to advise on your "muscle cramp pills" --Trial low dose baclofen (muscle relaxer that I'm prescribing)    Take care,  Inda Coke PA-C

## 2022-08-27 DIAGNOSIS — E1165 Type 2 diabetes mellitus with hyperglycemia: Secondary | ICD-10-CM | POA: Diagnosis not present

## 2022-09-04 ENCOUNTER — Ambulatory Visit (INDEPENDENT_AMBULATORY_CARE_PROVIDER_SITE_OTHER): Payer: Medicare Other | Admitting: Cardiovascular Disease

## 2022-09-04 ENCOUNTER — Encounter (HOSPITAL_BASED_OUTPATIENT_CLINIC_OR_DEPARTMENT_OTHER): Payer: Self-pay | Admitting: Cardiovascular Disease

## 2022-09-04 DIAGNOSIS — E1159 Type 2 diabetes mellitus with other circulatory complications: Secondary | ICD-10-CM

## 2022-09-04 DIAGNOSIS — R002 Palpitations: Secondary | ICD-10-CM | POA: Diagnosis not present

## 2022-09-04 DIAGNOSIS — I152 Hypertension secondary to endocrine disorders: Secondary | ICD-10-CM | POA: Diagnosis not present

## 2022-09-04 HISTORY — DX: Palpitations: R00.2

## 2022-09-04 MED ORDER — DILTIAZEM HCL ER COATED BEADS 240 MG PO CP24: 240.0000 mg | ORAL_CAPSULE | Freq: Every day | ORAL | 1 refills | Status: DC

## 2022-09-04 NOTE — Progress Notes (Signed)
Cardiology Office Note:    Date:  10/04/2022   ID:  Teresa Rice, DOB 11/26/1946, MRN [id number]  PCP:  Tawnya Crook, MD   [facility] Providers Cardiologist:  None     Referring MD: Tawnya Crook, MD   No chief complaint on file.   History of Present Illness:    Teresa Rice is a 76 y.o. female with a hx of hypertension, hyperlipidemia, type 2 diabetes mellitus, GERD, asthma, and bladder cancer, here for the evaluation of palpitations at the request of Dr. Cherlynn Kaiser. She saw her PCP Dr. Cherlynn Kaiser on 07/12/2022 where she reported palpitations, and heart rates as high as 144 bpm with minimal walking. Her EKG showed PAC's, rate 94, and no ST changes. She was on metoprolol XL 50 mg. At her follow-up on 07/19/2022 her metoprolol was increased to 75 mg and she was referred to cardiology for further evaluation.  Today, she reports that her initial episode of irregular heart rates was a couple months ago. At that time she was getting out of bed, her lips felt numb, and she felt as though she was having an anxiety attack. She has struggled with panic attacks for years. They are sometimes associated with chest discomfort or pressure. Lately she is feeling irregular heartbeats every day. Currently she is taking 75 mg metoprolol; however after taking this her body feels drained and she is very drowsy. Since the onset of her irregular heart rates she has switched to drinking 1 cup of decaf coffee. Brief activity such as walking to the restroom will elevate her heart rate. After siting on the couch and relaxing her heart rate will begin to slow down. She states that while playing with her great grandchildren her heart rate increases but she otherwise feels fine. Yesterday she joined the gym; she enjoys walking on the treadmill. She had been advised to back off if her heart rate increases too much on the treadmill. A couple days ago her at home blood pressure was 135/72. Last night she knew her blood  pressure was elevated because she had the worst headache. She endorses swelling in her bilateral feet. She takes furosemide every day. Currently she is wearing compression socks, but she does not always wear them. She denies any lightheadedness, syncope, orthopnea, or PND.   Past Medical History:  Diagnosis Date   Bladder cancer [facility]) first dx 2012   Recurrent Bladder Cancer--  (urologist-  dr Diona Fanti)   Chronic constipation    Closed fracture of left distal radius 08/30/2016   Full dentures    GERD (gastroesophageal reflux disease)    Hyperlipidemia    Hypertension    Mild intermittent asthma    OA (osteoarthritis)    fingers    Palpitations 09/04/2022   Trigger finger of both hands    wear slints and special gloves at night   Type 2 diabetes mellitus [facility])     Past Surgical History:  Procedure Laterality Date   BLADDER SURGERY  2012   in [place]   CYSTOSCOPY/RETROGRADE/URETEROSCOPY Left 05/24/2014   Procedure: CYSTOSCOPY/RETROGRADE/ URETEROSCOPY;  Surgeon: Jorja Loa, MD;  Location: [facility];  Service: Urology;  Laterality: Left;   TRANSURETHRAL RESECTION OF BLADDER TUMOR N/A 05/24/2014   Procedure: TRANSURETHRAL RESECTION OF BLADDER TUMOR (TURBT) ;  Surgeon: Jorja Loa, MD;  Location: [facility];  Service: Urology;  Laterality: N/A;   TRANSURETHRAL RESECTION OF BLADDER TUMOR N/A 08/04/2015  Procedure: TRANSURETHRAL RESECTION OF BLADDER TUMOR (TURBT);  Surgeon: Franchot Gallo, MD;  Location: [facility];  Service: Urology;  Laterality: N/A;    Current Medications: Current Meds  Medication Sig   Accu-Chek Softclix Lancets lancets    ALPRAZolam (XANAX) 1 MG tablet 1/2 tablet TID and 3/4 tablet at night   azelastine (ASTELIN) 0.1 % nasal spray Place 1 spray into both nostrils in the morning. Use in each nostril as directed   B-D ULTRAFINE III SHORT PEN 31G X 8 MM MISC Inject into the skin.   baclofen (LIORESAL) 10  MG tablet Take 0.5 tablets (5 mg total) by mouth 2 (two) times daily as needed for muscle spasms.   blood glucose meter kit and supplies Dispense based on patient and insurance preference. Use up to four times daily as directed. (FOR ICD-10 E10.9, E11.9).   Calcium Carbonate (CALCIUM 600 PO) Take 1 tablet by mouth daily in the afternoon.   cholecalciferol (VITAMIN D3) 25 MCG (1000 UNIT) tablet Take 1,000 Units by mouth daily.   diltiazem (CARTIA XT) 240 MG 24 hr capsule Take 1 capsule (240 mg total) by mouth daily.   fenofibrate 160 MG tablet Take 1 tablet by mouth once daily   furosemide (LASIX) 20 MG tablet Take 20 mg by mouth daily as needed.   glucose blood test strip Use as instructed   LANTUS SOLOSTAR 100 UNIT/ML Solostar Pen INJECT 15 UNITS SUBCUTANEOUSLY ONCE DAILY AT NIGHT   levothyroxine (SYNTHROID) 25 MCG tablet Take one pill daily for thyroid   losartan (COZAAR) 100 MG tablet Take 1 tablet (100 mg total) by mouth daily.   magnesium oxide (MAG-OX) 400 MG tablet Take 400 mg by mouth 2 (two) times daily.   metFORMIN (GLUCOPHAGE) 1000 MG tablet Take 1 tablet (1,000 mg total) by mouth 2 (two) times daily with a meal.   Multiple Vitamin (MULTIVITAMIN WITH MINERALS) TABS tablet Take 1 tablet by mouth daily.   nystatin cream (MYCOSTATIN) Apply topically.   Omega 3 1000 MG CAPS Take 1,000 mg by mouth daily.   omeprazole (PRILOSEC) 20 MG capsule Take 1 capsule (20 mg total) by mouth daily.   Polyethylene Glycol 3350 (MIRALAX PO) Take by mouth every evening.   rosuvastatin (CRESTOR) 10 MG tablet Take 1 tablet (10 mg total) by mouth daily.   Semaglutide, 2 MG/DOSE, (OZEMPIC, 2 MG/DOSE,) 8 MG/3ML SOPN Inject 2 mg into the skin once a week.   SYMBICORT 160-4.5 MCG/ACT inhaler Inhale 2 puffs into the lungs 2 (two) times daily.   traMADol (ULTRAM) 50 MG tablet Take 1 tablet by mouth every 6 (six) hours as needed.   triamcinolone cream (KENALOG) 0.1 % Apply topically 3 (three) times daily.    Vilazodone HCl 20 MG TABS Take 1 tablet (20 mg total) by mouth daily.   VITAMIN E PO Take by mouth.   [DISCONTINUED] albuterol (VENTOLIN HFA) 108 (90 Base) MCG/ACT inhaler Inhale 2 puffs into the lungs every 6 (six) hours as needed for wheezing or shortness of breath.   [DISCONTINUED] metoprolol succinate (TOPROL-XL) 50 MG 24 hr tablet Take 1.5 tablets (75 mg total) by mouth daily. Take with or immediately following a meal.     Allergies:   Celebrex [celecoxib], Glipizide, Penicillins, and Sulfa antibiotics   Social History   Socioeconomic History   Marital status: Widowed    Spouse name: Not on file   Number of children: 1   Years of education: 12   Highest education level: Not on  file  Occupational History   Occupation: retired  Tobacco Use   Smoking status: Former    Years: 30.00    Types: Cigarettes    Quit date: 05/21/1994    Years since quitting: 28.3   Smokeless tobacco: Never  Vaping Use   Vaping Use: Never used  Substance and Sexual Activity   Alcohol use: Yes    Comment: RARE   Drug use: No   Sexual activity: Not on file  Other Topics Concern   Not on file  Social History Narrative   Lives in an apartment, lives alone   One daughter   Right handed    Retired from [employer] work   Highest level of education:  GED   Social Determinants of [place] Strain: [place]  (12/15/2020)   Overall Financial Resource Strain (CARDIA)    Difficulty of Paying Living Expenses: Not hard at [place]: No [place] (12/15/2020)   Hunger Vital Sign    Worried About Running Out of Food in the Last Year: Never true    [place] in the Last Year: Never true  Transportation Needs: No Transportation Needs (12/15/2020)   PRAPARE - Hydrologist (Medical): No    Lack of Transportation (Non-Medical): No  Physical Activity: Inactive (12/15/2020)   Exercise Vital Sign    Days of Exercise per Week: 0 days    Minutes of  Exercise per Session: 0 min  Stress: No Stress Concern Present (12/15/2020)   [place]    Feeling of Stress : Only a little  Social Connections: Moderately Isolated (12/15/2020)   Social Connection and Isolation Panel [NHANES]    Frequency of Communication with Friends and Family: More than three times a week    Frequency of Social Gatherings with Friends and Family: Never    Attends Religious Services: More than 4 times per year    Active Member of [employer] or Organizations: No    Attends Archivist Meetings: Never    Marital Status: Widowed     Family History: The patient's family history includes AAA (abdominal aortic aneurysm) in her sister; Cancer in her brother and father; Diabetes in her brother; Early death in her mother.  ROS:   Please see the history of present illness.    (+) Palpitations (+) Anxiety attacks (+) Chest discomfort (+) Fatigue (+) Headache (+) Bilateral LE edema All other systems reviewed and are negative.  EKGs/Labs/Other Studies Reviewed:    The following studies were reviewed today:  LE Venous Doppler  09/19/2005: Findings:   Complete compressibility is demonstrated throughout the visualized deep veins. No venous filling defects are identified by gray-scale or color Doppler sonography. Doppler waveforms show normal direction of venous flow with normal phasicity and response to augmentation.  IMPRESSION:  Negative. No evidence of deep venous thrombosis.    EKG:   EKG is personally reviewed. 09/04/2022: Sinus tachycardia. Rate 102 bpm. Incomplete RBBB. Nonspecific T wave abnormalities.  Recent Labs: 07/12/2022: ALT 24; BUN 24; Creatinine, Ser 0.88; Hemoglobin 12.2; Magnesium 1.4; Platelets 318.0; Potassium 4.4; Sodium 141; TSH 3.36   Recent Lipid Panel    Component Value Date/Time   CHOL 136 07/12/2022 1029   TRIG 221.0 (H) 07/12/2022 1029   HDL 30.90 (L) 07/12/2022 1029    CHOLHDL 4 07/12/2022 1029   VLDL 44.2 (H) 07/12/2022 1029   LDLCALC 33 02/06/2022 1028  LDLDIRECT 75.0 07/12/2022 1029     Risk Assessment/Calculations:           Physical Exam:    Wt Readings from Last 3 Encounters:  09/04/22 172 lb 9.6 oz (78.3 kg)  08/23/22 169 lb (76.7 kg)  08/16/22 169 lb (76.7 kg)     VS:  BP (!) 148/72 (BP Location: Left Arm, Patient Position: Sitting, Cuff Size: Normal)   Pulse (!) 102   Ht _0  (1.6 m)   Wt 172 lb 9.6 oz (78.3 kg)   LMP  (LMP Unknown)   BMI 30.57 kg/m  , BMI Body mass index is 30.57 kg/m. GENERAL:  Well appearing HEENT: Pupils equal round and reactive, fundi not visualized, oral mucosa unremarkable NECK:  No jugular venous distention, waveform within normal limits, carotid upstroke brisk and symmetric, no bruits, no thyromegaly LUNGS:  Clear to auscultation bilaterally HEART:  RRR.  PMI not displaced or sustained,S1 and S2 within normal limits, no S3, no S4, no clicks, no rubs, no murmurs ABD:  Flat, positive bowel sounds normal in frequency in pitch, no bruits, no rebound, no guarding, no midline pulsatile mass, no hepatomegaly, no splenomegaly EXT:  2 plus pulses throughout, no edema, no cyanosis no clubbing SKIN:  No rashes no nodules NEURO:  Cranial nerves II through XII grossly intact, motor grossly intact throughout PSYCH:  Cognitively intact, oriented to person place and time   ASSESSMENT:    1. Hypertension associated with diabetes ([place])   2. Palpitations    PLAN:    Hypertension associated with diabetes ([place]) Blood pressure is elevated today both initially and on repeat.  She still having a lot of palpitations and thinks that the metoprolol is causing fatigue.  We will stop the metoprolol and switch to diltiazem 240 mg daily.  Continue losartan.  She will keep checking her blood pressures at home and bring to follow-up.  Palpitations Patient was referred for palpitations.  She has some mild tachycardia today  with a heart rate of 102.  We will get an echo to see if there is any evidence of any tachycardia mediated cardiomyopathy.  She appears to be euvolemic on exam.  Continue furosemide.  She is not tolerating metoprolol well.  We will stop and add diltiazem as above.  7-day ZIO monitor.  She already had all the appropriate laboratory testing.  She was mildly hypomagnesemic.  She has been started on magnesium supplementation.  Continue to avoid caffeine.        Disposition: FU with APP in 1 month. FU with Liyana Suniga C. Oval Linsey, MD, [facility] in 3-4 months.  Medication Adjustments/Labs and Tests Ordered: Current medicines are reviewed at length with the patient today.  Concerns regarding medicines are outlined above.   Orders Placed This Encounter  Procedures   LONG TERM MONITOR (3-14 DAYS)   EKG 12-Lead   ECHOCARDIOGRAM COMPLETE   Meds ordered this encounter  Medications   diltiazem (CARTIA XT) 240 MG 24 hr capsule    Sig: Take 1 capsule (240 mg total) by mouth daily.    Dispense:  90 capsule    Refill:  1    D/C METOPROLOL   Patient Instructions  Medication Instructions:  STOP METOPROLOL   START DILTIAZEM 240 MG DAILY   *If you need a refill on your cardiac medications before your next appointment, please call your pharmacy*  Lab Work: NONE  Testing/Procedures: Your physician has requested that you have an echocardiogram. Echocardiography is a painless test that uses  sound waves to create images of your heart. It provides your doctor with information about the size and shape of your heart and how well your heart's chambers and valves are working. This procedure takes approximately one hour. There are no restrictions for this procedure.  7 DAY ZIO MONITOR, THIS WILL BE MAILED TO YOU   Follow-Up: At [facility], you and your health needs are our priority.  As part of our continuing mission to provide you with exceptional heart care, we have created designated Provider Care  Teams.  These Care Teams include your primary Cardiologist (physician) and Advanced Practice Providers (APPs -  Physician Assistants and Nurse Practitioners) who all work together to provide you with the care you need, when you need it.  We recommend signing up for the patient portal called "MyChart".  Sign up information is provided on this After Visit Summary.  MyChart is used to connect with patients for Virtual Visits (Telemedicine).  Patients are able to view lab/test results, encounter notes, upcoming appointments, etc.  Non-urgent messages can be sent to your provider as well.   To learn more about what you can do with MyChart, go to [email or web address].    Your next appointment:   1 month(s)  The format for your next appointment:   In Person  Provider:   Laurann Montana, NP    DR [place] 3-4 MONTHS Other Instructions ZIO XT- Long Term Monitor Instructions  Your physician has requested you wear a ZIO patch monitor for 7 days.  This is a single patch monitor. [employer] supplies one patch monitor per enrollment. Additional stickers are not available. Please do not apply patch if you will be having a Nuclear Stress Test,  Echocardiogram, Cardiac CT, MRI, or Chest Xray during the period you would be wearing the  monitor. The patch cannot be worn during these tests. You cannot remove and re-apply the  ZIO XT patch monitor.  Your ZIO patch monitor will be mailed 3 day USPS to your address on file. It may take 3-5 days  to receive your monitor after you have been enrolled.  Once you have received your monitor, please review the enclosed instructions. Your monitor  has already been registered assigning a specific monitor serial # to you.  Billing and Patient Assistance Program Information  We have supplied [employer] with any of your insurance information on file for billing purposes. [employer] offers a sliding scale Patient Assistance Program for patients that do not have  insurance,  or whose insurance does not completely cover the cost of the ZIO monitor.  You must apply for the Patient Assistance Program to qualify for this discounted rate.  To apply, please call [employer] at [phone], select option 4, select option 2, ask to apply for  Patient Assistance Program. Theodore Demark will ask your household income, and how many people  are in your household. They will quote your out-of-pocket cost based on that information.  [employer] will also be able to set up a 74-month interest-free payment plan if needed.  Applying the monitor   Shave hair from upper left chest.  Hold abrader disc by orange tab. Rub abrader in 40 strokes over the upper left chest as  indicated in your monitor instructions.  Clean area with 4 enclosed alcohol pads. Let dry.  Apply patch as indicated in monitor instructions. Patch will be placed under collarbone on left  side of chest with arrow pointing upward.  Rub patch adhesive wings for 2 minutes. Remove white  label marked "1". Remove the white  label marked "2". Rub patch adhesive wings for 2 additional minutes.  While looking in a mirror, press and release button in center of patch. A small green light will  flash 3-4 times. This will be your only indicator that the monitor has been turned on.  Do not shower for the first 24 hours. You may shower after the first 24 hours.  Press the button if you feel a symptom. You will hear a small click. Record Date, Time and  Symptom in the Patient Logbook.  When you are ready to remove the patch, follow instructions on the last 2 pages of Patient  Logbook. Stick patch monitor onto the last page of Patient Logbook.  Place Patient Logbook in the blue and white box. Use locking tab on box and tape box closed  securely. The blue and white box has prepaid postage on it. Please place it in the mailbox as  soon as possible. Your physician should have your test results approximately 7 days after the  monitor has been  mailed back to [facility].  Call [place] at [phone] if you have questions regarding  your ZIO XT patch monitor. Call them immediately if you see an orange light blinking on your  monitor.  If your monitor falls off in less than 4 days, contact our Monitor department at [phone].  If your monitor becomes loose or falls off after 4 days call [employer] at [phone] for  suggestions on securing your monitor         I,Mathew Stumpf,acting as a scribe for Skeet Latch, MD.,have documented all relevant documentation on the behalf of Skeet Latch, MD,as directed by  Skeet Latch, MD while in the presence of Skeet Latch, MD.  I, [place] Oval Linsey, MD have reviewed all documentation for this visit.  The documentation of the exam, diagnosis, procedures, and orders on 10/04/2022 are all accurate and complete.   Signed, Skeet Latch, MD  10/04/2022 1:02 PM    [place]

## 2022-09-04 NOTE — Patient Instructions (Signed)
Medication Instructions:  STOP METOPROLOL   START DILTIAZEM 240 MG DAILY   *If you need a refill on your cardiac medications before your next appointment, please call your pharmacy*  Lab Work: NONE  Testing/Procedures: Your physician has requested that you have an echocardiogram. Echocardiography is a painless test that uses sound waves to create images of your heart. It provides your doctor with information about the size and shape of your heart and how well your heart's chambers and valves are working. This procedure takes approximately one hour. There are no restrictions for this procedure.  7 DAY ZIO MONITOR, THIS WILL BE MAILED TO YOU   Follow-Up: At [facility], you and your health needs are our priority.  As part of our continuing mission to provide you with exceptional heart care, we have created designated Provider Care Teams.  These Care Teams include your primary Cardiologist (physician) and Advanced Practice Providers (APPs -  Physician Assistants and Nurse Practitioners) who all work together to provide you with the care you need, when you need it.  We recommend signing up for the patient portal called "MyChart".  Sign up information is provided on this After Visit Summary.  MyChart is used to connect with patients for Virtual Visits (Telemedicine).  Patients are able to view lab/test results, encounter notes, upcoming appointments, etc.  Non-urgent messages can be sent to your provider as well.   To learn more about what you can do with MyChart, go to [email or web address].    Your next appointment:   1 month(s)  The format for your next appointment:   In Person  Provider:   Laurann Montana, NP    DR [place] 3-4 MONTHS Other Instructions ZIO XT- Long Term Monitor Instructions  Your physician has requested you wear a ZIO patch monitor for 7 days.  This is a single patch monitor. [employer] supplies one patch monitor per enrollment. Additional stickers are  not available. Please do not apply patch if you will be having a Nuclear Stress Test,  Echocardiogram, Cardiac CT, MRI, or Chest Xray during the period you would be wearing the  monitor. The patch cannot be worn during these tests. You cannot remove and re-apply the  ZIO XT patch monitor.  Your ZIO patch monitor will be mailed 3 day USPS to your address on file. It may take 3-5 days  to receive your monitor after you have been enrolled.  Once you have received your monitor, please review the enclosed instructions. Your monitor  has already been registered assigning a specific monitor serial # to you.  Billing and Patient Assistance Program Information  We have supplied [employer] with any of your insurance information on file for billing purposes. [employer] offers a sliding scale Patient Assistance Program for patients that do not have  insurance, or whose insurance does not completely cover the cost of the ZIO monitor.  You must apply for the Patient Assistance Program to qualify for this discounted rate.  To apply, please call [employer] at [phone], select option 4, select option 2, ask to apply for  Patient Assistance Program. Theodore Demark will ask your household income, and how many people  are in your household. They will quote your out-of-pocket cost based on that information.  [employer] will also be able to set up a 2-month interest-free payment plan if needed.  Applying the monitor   Shave hair from upper left chest.  Hold abrader disc by orange tab. Rub abrader in 40 strokes over the upper left  chest as  indicated in your monitor instructions.  Clean area with 4 enclosed alcohol pads. Let dry.  Apply patch as indicated in monitor instructions. Patch will be placed under collarbone on left  side of chest with arrow pointing upward.  Rub patch adhesive wings for 2 minutes. Remove white label marked "1". Remove the white  label marked "2". Rub patch adhesive wings for 2 additional minutes.   While looking in a mirror, press and release button in center of patch. A small green light will  flash 3-4 times. This will be your only indicator that the monitor has been turned on.  Do not shower for the first 24 hours. You may shower after the first 24 hours.  Press the button if you feel a symptom. You will hear a small click. Record Date, Time and  Symptom in the Patient Logbook.  When you are ready to remove the patch, follow instructions on the last 2 pages of Patient  Logbook. Stick patch monitor onto the last page of Patient Logbook.  Place Patient Logbook in the blue and white box. Use locking tab on box and tape box closed  securely. The blue and white box has prepaid postage on it. Please place it in the mailbox as  soon as possible. Your physician should have your test results approximately 7 days after the  monitor has been mailed back to [facility]  Call [place] at [phone] if you have questions regarding  your ZIO XT patch monitor. Call them immediately if you see an orange light blinking on your  monitor.  If your monitor falls off in less than 4 days, contact our Monitor department at [phone].  If your monitor becomes loose or falls off after 4 days call [employer] at [phone] for  suggestions on securing your monitor

## 2022-09-04 NOTE — Assessment & Plan Note (Signed)
Blood pressure is elevated today both initially and on repeat.  She still having a lot of palpitations and thinks that the metoprolol is causing fatigue.  We will stop the metoprolol and switch to diltiazem 240 mg daily.  Continue losartan.  She will keep checking her blood pressures at home and bring to follow-up.

## 2022-09-04 NOTE — Assessment & Plan Note (Signed)
Patient was referred for palpitations.  She has some mild tachycardia today with a heart rate of 102.  We will get an echo to see if there is any evidence of any tachycardia mediated cardiomyopathy.  She appears to be euvolemic on exam.  Continue furosemide.  She is not tolerating metoprolol well.  We will stop and add diltiazem as above.  7-day ZIO monitor.  She already had all the appropriate laboratory testing.  She was mildly hypomagnesemic.  She has been started on magnesium supplementation.  Continue to avoid caffeine.

## 2022-09-05 ENCOUNTER — Ambulatory Visit: Payer: Medicare Other | Attending: Cardiovascular Disease

## 2022-09-05 DIAGNOSIS — R002 Palpitations: Secondary | ICD-10-CM

## 2022-09-05 NOTE — Progress Notes (Unsigned)
Enrolled for [employer] to mail a ZIO XT long term holter monitor to the patients address on file.  

## 2022-09-07 DIAGNOSIS — R002 Palpitations: Secondary | ICD-10-CM

## 2022-09-10 ENCOUNTER — Other Ambulatory Visit: Payer: Self-pay | Admitting: Vascular Surgery

## 2022-09-10 DIAGNOSIS — M79604 Pain in right leg: Secondary | ICD-10-CM

## 2022-09-14 ENCOUNTER — Telehealth: Payer: Self-pay | Admitting: Cardiovascular Disease

## 2022-09-14 NOTE — Telephone Encounter (Signed)
Patient calling to request she come in and have someone put her heart monitor on.

## 2022-09-14 NOTE — Telephone Encounter (Signed)
Spoke with patient and advised could come between 2:30-3:00 today for monitor

## 2022-09-17 ENCOUNTER — Telehealth (HOSPITAL_BASED_OUTPATIENT_CLINIC_OR_DEPARTMENT_OTHER): Payer: Self-pay | Admitting: Cardiovascular Disease

## 2022-09-17 NOTE — Telephone Encounter (Signed)
Returned call to patient.  Instructed her it was ok to remove the monitor, place it in the box with the address label and put in the mail.  Pt verbalized understanding. Georgana Curio MHA RN CCM

## 2022-09-17 NOTE — Telephone Encounter (Signed)
Called patient to confirm her Echocardiogram appointment on Wednesday 09/19/22--pt states she is still wearing her monitor, but is having a reaction to the tape---she has a rash and it is itching.  Can we remove the monitor early (due to come off on Friday 09/21/22) ?

## 2022-09-19 ENCOUNTER — Ambulatory Visit (INDEPENDENT_AMBULATORY_CARE_PROVIDER_SITE_OTHER): Payer: Medicare Other

## 2022-09-19 DIAGNOSIS — I152 Hypertension secondary to endocrine disorders: Secondary | ICD-10-CM | POA: Diagnosis not present

## 2022-09-19 DIAGNOSIS — R002 Palpitations: Secondary | ICD-10-CM

## 2022-09-19 DIAGNOSIS — E1159 Type 2 diabetes mellitus with other circulatory complications: Secondary | ICD-10-CM

## 2022-09-19 LAB — ECHOCARDIOGRAM COMPLETE
Area-P 1/2: 5.06 cm2
S' Lateral: 1.71 cm

## 2022-09-23 ENCOUNTER — Other Ambulatory Visit: Payer: Self-pay | Admitting: Family Medicine

## 2022-09-24 NOTE — Telephone Encounter (Signed)
Spoke to patient and she stated that she has been using it a little more often than usual. Patient did not say how much more often, when asked if she requested refill or pharmacy, she stated she did, but when asked about how often she was using it she stated sometimes she have to [place] it a little more because she cannot breath and then she stated you all don't have to refill it.

## 2022-09-27 DIAGNOSIS — E1165 Type 2 diabetes mellitus with hyperglycemia: Secondary | ICD-10-CM | POA: Diagnosis not present

## 2022-09-29 DIAGNOSIS — R002 Palpitations: Secondary | ICD-10-CM | POA: Diagnosis not present

## 2022-09-29 IMAGING — DX DG CHEST 2V
2 series · 2 of 2 positions shown · non-contrast
Comparison: None.

CLINICAL DATA: Chronic cough

EXAM:
CHEST - 2 VIEW

[chest pa]
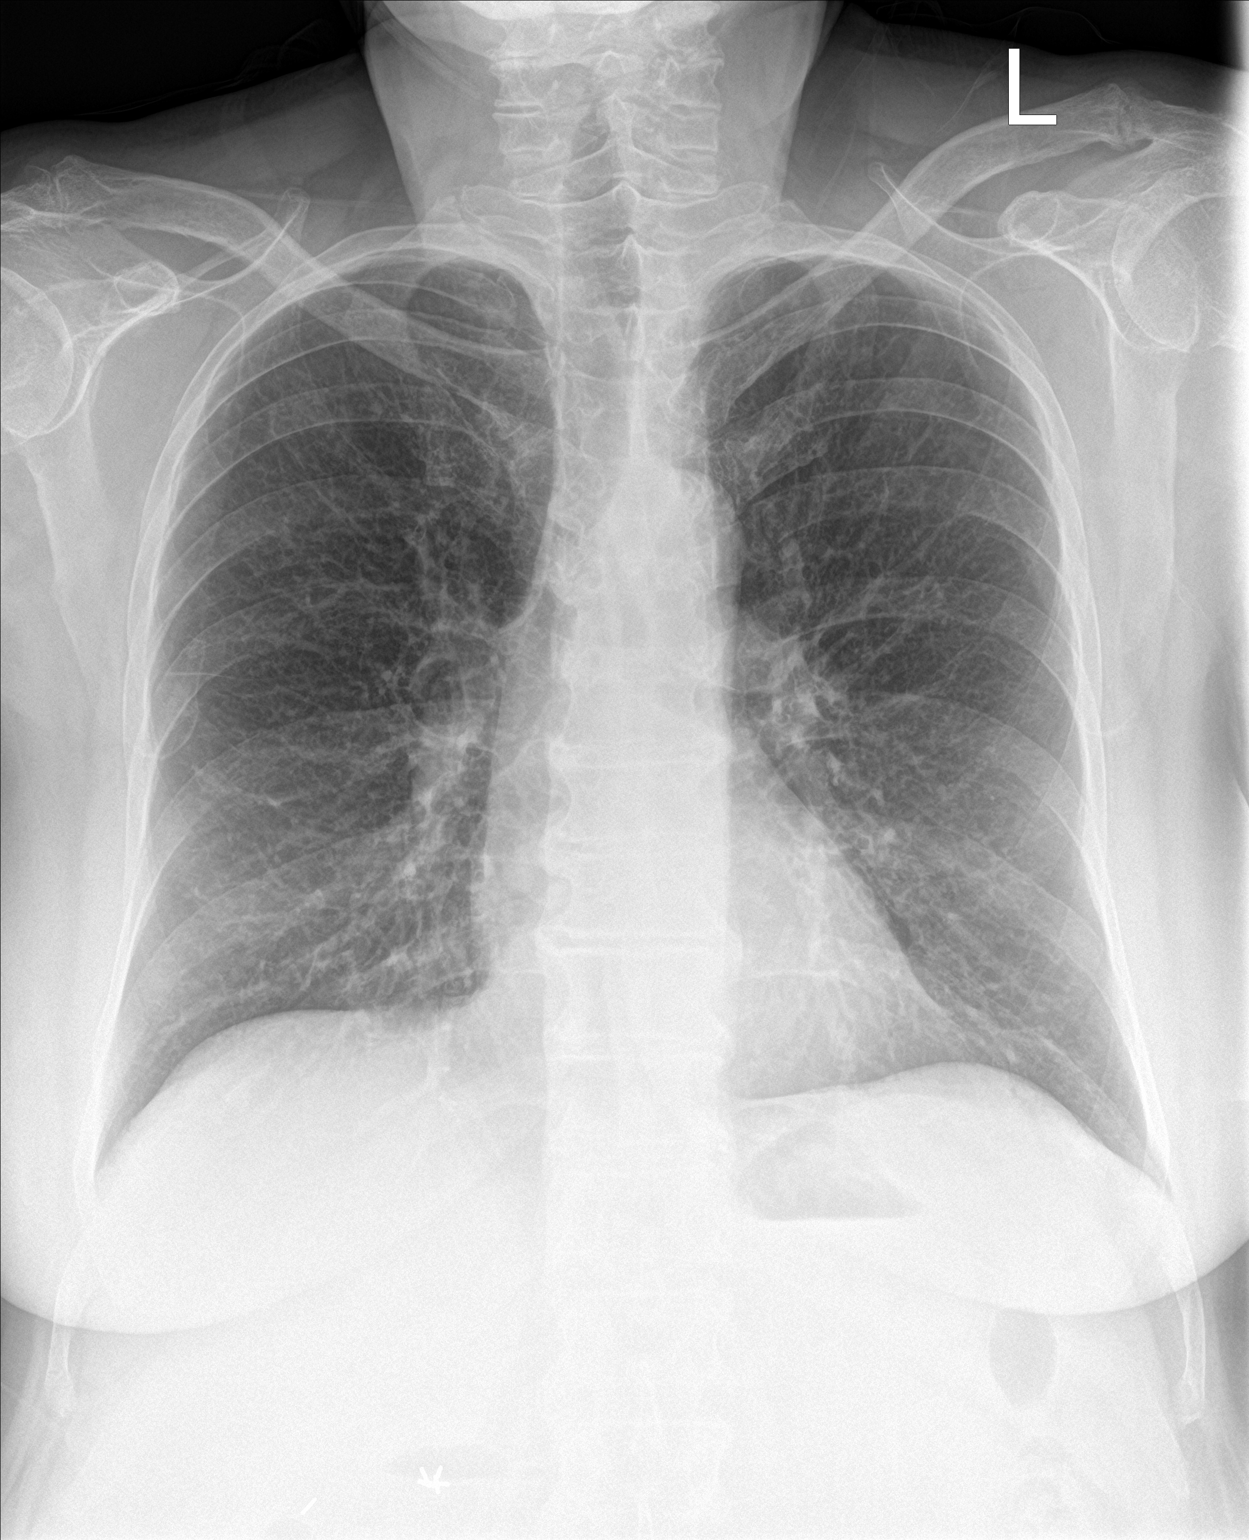

[chest lat]
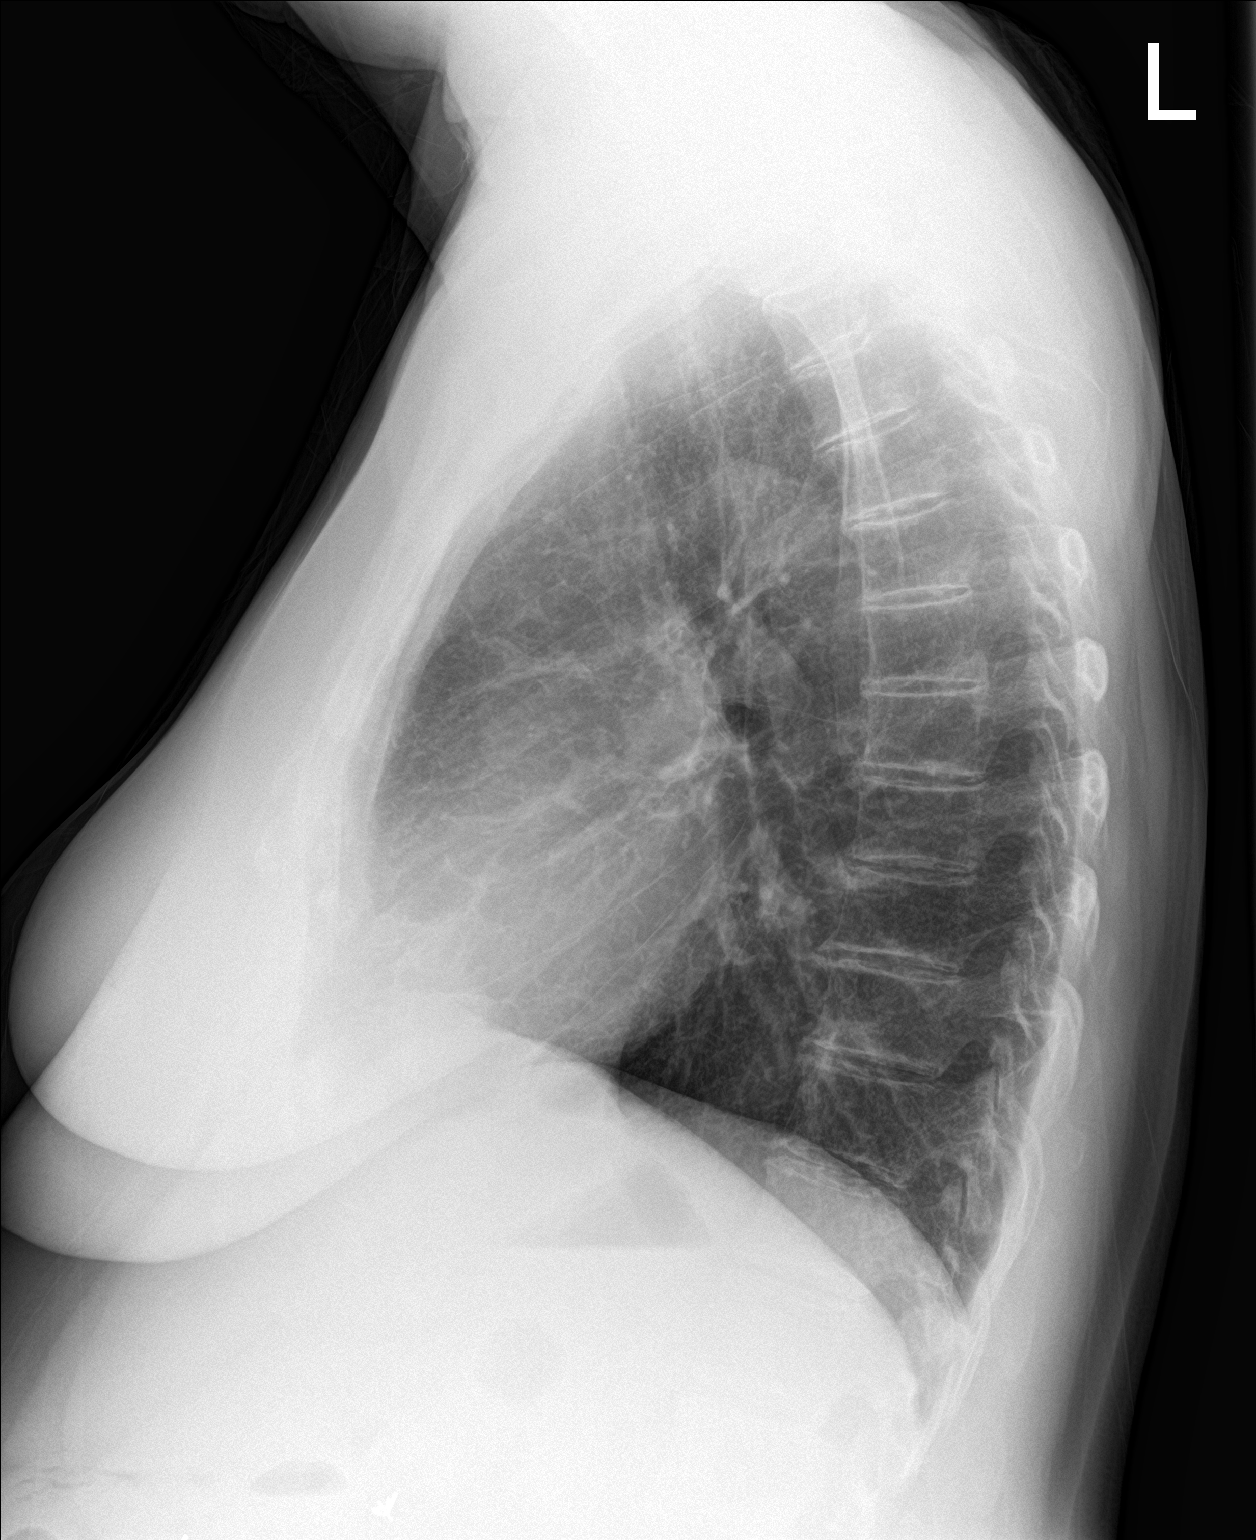

[2 of 2 positions shown; findings below may reference images not displayed]

FINDINGS: The heart size and mediastinal contours are within normal limits.
Both lungs are clear. Degenerative changes seen in the midthoracic
spine.
IMPRESSION: No active cardiopulmonary disease.

## 2022-10-04 ENCOUNTER — Encounter (HOSPITAL_BASED_OUTPATIENT_CLINIC_OR_DEPARTMENT_OTHER): Payer: Self-pay | Admitting: Cardiovascular Disease

## 2022-10-08 ENCOUNTER — Other Ambulatory Visit: Payer: Self-pay | Admitting: Psychiatry

## 2022-10-08 DIAGNOSIS — F4310 Post-traumatic stress disorder, unspecified: Secondary | ICD-10-CM

## 2022-10-08 DIAGNOSIS — F4001 Agoraphobia with panic disorder: Secondary | ICD-10-CM

## 2022-10-10 ENCOUNTER — Telehealth (HOSPITAL_BASED_OUTPATIENT_CLINIC_OR_DEPARTMENT_OTHER): Payer: Self-pay | Admitting: Family

## 2022-10-10 NOTE — Telephone Encounter (Signed)
Spoke with patient regarding new appointment date and time Tuesday 10/16/22 at 2:45 pm with Laurann Montana, NP--appointment change from 10/10/22 at 1:30 pm (provider not in ).  Paient voiced her unrdestanding.

## 2022-10-11 ENCOUNTER — Ambulatory Visit (HOSPITAL_BASED_OUTPATIENT_CLINIC_OR_DEPARTMENT_OTHER): Payer: Medicare Other | Admitting: Family

## 2022-10-16 ENCOUNTER — Encounter (HOSPITAL_BASED_OUTPATIENT_CLINIC_OR_DEPARTMENT_OTHER): Payer: Self-pay | Admitting: Family

## 2022-10-16 ENCOUNTER — Ambulatory Visit (INDEPENDENT_AMBULATORY_CARE_PROVIDER_SITE_OTHER): Payer: Medicare Other | Admitting: Family

## 2022-10-16 VITALS — BP 134/60 | HR 100 | Ht 63.0 in | Wt 171.0 lb

## 2022-10-16 DIAGNOSIS — R0683 Snoring: Secondary | ICD-10-CM

## 2022-10-16 DIAGNOSIS — I10 Essential (primary) hypertension: Secondary | ICD-10-CM

## 2022-10-16 DIAGNOSIS — R002 Palpitations: Secondary | ICD-10-CM | POA: Diagnosis not present

## 2022-10-16 DIAGNOSIS — G4730 Sleep apnea, unspecified: Secondary | ICD-10-CM | POA: Diagnosis not present

## 2022-10-16 MED ORDER — VALSARTAN 160 MG PO TABS: 160.0000 mg | ORAL_TABLET | Freq: Every day | ORAL | 1 refills | Status: DC

## 2022-10-16 NOTE — Patient Instructions (Addendum)
Medication Instructions:  Your physician has recommended you make the following change in your medication:   STOP Losartan  START Valsartan '160mg'$  daily  *If you need a refill on your cardiac medications before your next appointment, please call your pharmacy*   Lab Work: Your physician recommends that you return for lab work in 2 weeks for BMP  Please return for Lab work. You may come to the...   [facility] (3rd floor) [address], [place], [place] [address]  Open: 8am-Noon and 1pm-4:30pm  Please ring the doorbell on the small table when you exit the elevator and the Lab Tech will come get you  [place] at [facility] [address], [place], [place] [address] Open: 8am-1pm, then 2pm-4:30pm   [place]- Please see attached locations sheet stapled to your lab work with address and hours.    If you have labs (blood work) drawn today and your tests are completely normal, you will receive your results only by: [place] (if you have MyChart) OR A paper copy in the mail If you have any lab test that is abnormal or we need to change your treatment, we will call you to review the results.   Testing/Procedures: Your physician has recommended that you have a sleep study. This test records several body functions during sleep, including: brain activity, eye movement, oxygen and carbon dioxide blood levels, heart rate and rhythm, breathing rate and rhythm, the flow of air through your mouth and nose, snoring, body muscle movements, and chest and belly movement.    Follow-Up: At [facility], you and your health needs are our priority.  As part of our continuing mission to provide you with exceptional heart care, we have created designated Provider Care Teams.  These Care Teams include your primary Cardiologist (physician) and Advanced Practice Providers (APPs -  Physician Assistants and Nurse Practitioners) who all work  together to provide you with the care you need, when you need it.  We recommend signing up for the patient portal called "MyChart".  Sign up information is provided on this After Visit Summary.  MyChart is used to connect with patients for Virtual Visits (Telemedicine).  Patients are able to view lab/test results, encounter notes, upcoming appointments, etc.  Non-urgent messages can be sent to your provider as well.   To learn more about what you can do with MyChart, go to [email or web address].    Your next appointment:   As scheduled with Dr. Oval Linsey  Other Instructions     Provider Referral Exercise Program (P.R.E.P.)      12 Weeks to Wellness       What is included in the Wise and personalized exercise prescription --Full Membership to participating [facility] for the 12 weeks --Pre- and Post-consultations to assess progress and formulate an exercise plan for       continuation of exercise   What is my investment?  Your cost is $100 (reg. $144). This includes full membership privileges to [facility] [place] and across the country. If you complete the post-assessment visit, any applicable enrollment costs will be waived if you decide to join the [facility].   Who will benefit from PREP?  People with challenges, including (but not limited to): ---Low back pain ---Arthritis ---Hypertension ---Diabetes ---Obesity ---Joint Replacement ---Neuromuscular Disorders ---Cancer Recovery ---Weight Loss ---Many others (as determined by provider)   Get Started Today! Ask your healthcare provider to fill out a Healthcare Provider Referral for Exercise  form. Be sure to turn it in before you leave the office and send/fax/email it directly to the Wellness RN to schedule your Initial Consultation. If you have family or friends that would also benefit, please share this referral with them.   Contacts:  [facility] [phone]    Winifred Olive, Wellness RN  [phone]   [email or web address]     Important Information About Sugar

## 2022-10-16 NOTE — Progress Notes (Unsigned)
Office Visit    Patient Name: Teresa Rice Date of Encounter: 10/16/2022  PCP:  Tawnya Crook, MD   [place]  Cardiologist:  Skeet Latch, MD *** Advanced Practice Provider:  No care team member to display Electrophysiologist:  None  {Press F2 to show EP APP, CHF, sleep or structural heart MD               :[id number]}  { Click here to update then REFRESH NOTE - MD (PCP) or APP (Team Member)  Change PCP Type for MD, Specialty for APP is either Cardiology or Clinical Cardiac Electrophysiology  :[id number]}  Chief Complaint    Teresa Rice is a 76 y.o. female presents today for ***   Past Medical History    Past Medical History:  Diagnosis Date   Bladder cancer ([place]) first dx 2012   Recurrent Bladder Cancer--  (urologist-  dr Diona Fanti)   Chronic constipation    Closed fracture of left distal radius 08/30/2016   Full dentures    GERD (gastroesophageal reflux disease)    Hyperlipidemia    Hypertension    Mild intermittent asthma    OA (osteoarthritis)    fingers    Palpitations 09/04/2022   Trigger finger of both hands    wear slints and special gloves at night   Type 2 diabetes mellitus [facility])    Past Surgical History:  Procedure Laterality Date   BLADDER SURGERY  2012   in [place]   CYSTOSCOPY/RETROGRADE/URETEROSCOPY Left 05/24/2014   Procedure: CYSTOSCOPY/RETROGRADE/ URETEROSCOPY;  Surgeon: Jorja Loa, MD;  Location: [facility];  Service: Urology;  Laterality: Left;   TRANSURETHRAL RESECTION OF BLADDER TUMOR N/A 05/24/2014   Procedure: TRANSURETHRAL RESECTION OF BLADDER TUMOR (TURBT) ;  Surgeon: Jorja Loa, MD;  Location: [facility];  Service: Urology;  Laterality: N/A;   TRANSURETHRAL RESECTION OF BLADDER TUMOR N/A 08/04/2015   Procedure: TRANSURETHRAL RESECTION OF BLADDER TUMOR (TURBT);  Surgeon: Franchot Gallo, MD;  [facility];  Service: Urology;   Laterality: N/A;    Allergies  Allergies  Allergen Reactions   Celebrex [Celecoxib] Anaphylaxis   Glipizide Itching   Penicillins Other (See Comments)    "Burn marks from inside out"    Sulfa Antibiotics Itching    History of Present Illness    Teresa Rice is a 76 y.o. female with a hx of tachycardia, HTN, anxiety, Dm2 last seen 09/04/22 by Dr. Oval Linsey.  Echo 09/19/22 with normal LVEF 55-60%, no RWMA, mild asymmetric LVH  She had one episode where she started to breath heavy and her heart felt like it was pounding. Notes her heart rate is labile at home. BP at home 130/72-144/70s which is improved since starting Diltiazem. She notes no shortness of breath at rest. Does note some exertional dyspnea.   Notes no chest pain, lightheadedness, dizziness.   Notes shortness of breath with as little activity as walking from her kitchen to her living room. She does snore at night. Notes she sleeps well with her Xanax at bedtime but wakes up feeling tired and also notes she is tired during the day.   REcently started a walking regimen with her daughter.   Notes her albuterol does help.   EKGs/Labs/Other Studies Reviewed:   The following studies were reviewed today:  Echo 09/19/22    1. Left ventricular ejection fraction, by estimation, is 55 to 60%. The  left ventricle has normal function. The left ventricle has no regional  wall motion abnormalities. There is mild asymmetric left ventricular  hypertrophy of the basal-septal segment.  Left ventricular diastolic parameters are consistent with Grade I  diastolic dysfunction (impaired relaxation).   2. Right ventricular systolic function is normal. The right ventricular  size is normal. Tricuspid regurgitation signal is inadequate for assessing  PA pressure.   3. The mitral valve is normal in structure. Mild mitral valve  regurgitation.   4. The aortic valve is tricuspid. There is mild calcification of the  aortic valve.  There is mild thickening of the aortic valve. Aortic valve  regurgitation is not visualized. Aortic valve sclerosis/calcification is  present, without any evidence of  aortic stenosis.   5. The inferior vena cava is normal in size with greater than 50%  respiratory variability, suggesting right atrial pressure of 3 mmHg.   Comparison(s): No prior Echocardiogram.  EKG:  EKG is not ordered today.    Recent Labs: 07/12/2022: ALT 24; BUN 24; Creatinine, Ser 0.88; Hemoglobin 12.2; Magnesium 1.4; Platelets 318.0; Potassium 4.4; Sodium 141; TSH 3.36  Recent Lipid Panel    Component Value Date/Time   CHOL 136 07/12/2022 1029   TRIG 221.0 (H) 07/12/2022 1029   HDL 30.90 (L) 07/12/2022 1029   CHOLHDL 4 07/12/2022 1029   VLDL 44.2 (H) 07/12/2022 1029   LDLCALC 33 02/06/2022 1028   LDLDIRECT 75.0 07/12/2022 1029    Home Medications   Current Meds  Medication Sig   Accu-Chek Softclix Lancets lancets    albuterol (VENTOLIN HFA) 108 (90 Base) MCG/ACT inhaler INHALE 2 PUFFS BY MOUTH EVERY 6 HOURS AS NEEDED FOR WHEEZING OR SHORTNESS OF BREATH   ALPRAZolam (XANAX) 1 MG tablet TAKE 1/2 (ONE-HALF) TABLET BY MOUTH THREE TIMES DAILY AND 3/4 (THREE-FOURTHS) TABLET AT BEDTIME   azelastine (ASTELIN) 0.1 % nasal spray Place 1 spray into both nostrils in the morning. Use in each nostril as directed   B-D ULTRAFINE III SHORT PEN 31G X 8 MM MISC Inject into the skin.   baclofen (LIORESAL) 10 MG tablet Take 0.5 tablets (5 mg total) by mouth 2 (two) times daily as needed for muscle spasms.   blood glucose meter kit and supplies Dispense based on patient and insurance preference. Use up to four times daily as directed. (FOR ICD-10 E10.9, E11.9).   Calcium Carbonate (CALCIUM 600 PO) Take 1 tablet by mouth daily in the afternoon.   cholecalciferol (VITAMIN D3) 25 MCG (1000 UNIT) tablet Take 1,000 Units by mouth daily.   diltiazem (CARTIA XT) 240 MG 24 hr capsule Take 1 capsule (240 mg total) by mouth daily.    fenofibrate 160 MG tablet Take 1 tablet by mouth once daily   furosemide (LASIX) 20 MG tablet Take 20 mg by mouth daily as needed.   glucose blood test strip Use as instructed   LANTUS SOLOSTAR 100 UNIT/ML Solostar Pen INJECT 15 UNITS SUBCUTANEOUSLY ONCE DAILY AT NIGHT   levothyroxine (SYNTHROID) 25 MCG tablet Take one pill daily for thyroid   losartan (COZAAR) 100 MG tablet Take 1 tablet (100 mg total) by mouth daily.   magnesium oxide (MAG-OX) 400 MG tablet Take 400 mg by mouth 2 (two) times daily.   metFORMIN (GLUCOPHAGE) 1000 MG tablet Take 1 tablet (1,000 mg total) by mouth 2 (two) times daily with a meal.   Multiple Vitamin (MULTIVITAMIN WITH MINERALS) TABS tablet Take 1 tablet by mouth daily.   nystatin cream (MYCOSTATIN) Apply topically.  Omega 3 1000 MG CAPS Take 1,000 mg by mouth daily.   omeprazole (PRILOSEC) 20 MG capsule Take 1 capsule (20 mg total) by mouth daily.   Polyethylene Glycol 3350 (MIRALAX PO) Take by mouth every evening.   rosuvastatin (CRESTOR) 10 MG tablet Take 1 tablet (10 mg total) by mouth daily.   Semaglutide, 2 MG/DOSE, (OZEMPIC, 2 MG/DOSE,) 8 MG/3ML SOPN Inject 2 mg into the skin once a week.   SYMBICORT 160-4.5 MCG/ACT inhaler Inhale 2 puffs into the lungs 2 (two) times daily.   traMADol (ULTRAM) 50 MG tablet Take 1 tablet by mouth every 6 (six) hours as needed.   triamcinolone cream (KENALOG) 0.1 % Apply topically 3 (three) times daily.   Vilazodone HCl 20 MG TABS Take 1 tablet (20 mg total) by mouth daily.   VITAMIN E PO Take by mouth.     Review of Systems   ***   All other systems reviewed and are otherwise negative except as noted above.  Physical Exam    VS:  BP 134/60   Pulse 100   Ht _0  (1.6 m)   Wt 171 lb (77.6 kg)   LMP  (LMP Unknown)   SpO2 92%   BMI 30.29 kg/m  , BMI Body mass index is 30.29 kg/m.  Wt Readings from Last 3 Encounters:  10/16/22 171 lb (77.6 kg)  09/04/22 172 lb 9.6 oz (78.3 kg)  08/23/22 169 lb (76.7 kg)      GEN: Well nourished, well developed, in no acute distress. HEENT: normal. Neck: Supple, no JVD, carotid bruits, or masses. Cardiac: ***RRR, no murmurs, rubs, or gallops. No clubbing, cyanosis, edema.  ***Radials/PT 2+ and equal bilaterally.  Respiratory:  ***Respirations regular and unlabored, clear to auscultation bilaterally. GI: Soft, nontender, nondistended. MS: No deformity or atrophy. Skin: Warm and dry, no rash. Neuro:  Strength and sensation are intact. Psych: Normal affect.  Assessment & Plan    HTN - BP mildly elevated in clinic. Discussed to monitor BP at home at least 2 hours after medications and sitting for 5-10 minutes. ***  Tachycardia - Monitor ***. Continue Diltiazem 279m daily. If she has worsening palpitations, she will let uKoreaknow and we will consider increasing dose to 302m   Snores/Sleep disordered breathing - STOP Bang 5. Notes snoring and daytime somnolence. Home sleep study ordered.         Disposition: Follow up  as scheduled  with TiSkeet LatchMD or APP.  Signed, CaLoel DubonnetNP 10/16/2022, 3:16 PM [place]

## 2022-10-17 ENCOUNTER — Encounter (HOSPITAL_BASED_OUTPATIENT_CLINIC_OR_DEPARTMENT_OTHER): Payer: Self-pay | Admitting: Family

## 2022-10-17 ENCOUNTER — Telehealth: Payer: Self-pay

## 2022-10-17 NOTE — Telephone Encounter (Signed)
Call to pt, explained program and locations available. Would like to be at [facility] ([facility]) and afternoon class in Dec.  Advised will call her back when starting classes at [facility]. Given my number for call back.

## 2022-10-27 DIAGNOSIS — E1165 Type 2 diabetes mellitus with hyperglycemia: Secondary | ICD-10-CM | POA: Diagnosis not present

## 2022-10-30 DIAGNOSIS — G4730 Sleep apnea, unspecified: Secondary | ICD-10-CM | POA: Diagnosis not present

## 2022-10-30 DIAGNOSIS — R0683 Snoring: Secondary | ICD-10-CM | POA: Diagnosis not present

## 2022-10-30 DIAGNOSIS — I10 Essential (primary) hypertension: Secondary | ICD-10-CM | POA: Diagnosis not present

## 2022-10-31 LAB — BASIC METABOLIC PANEL
BUN/Creatinine Ratio: 22 (ref 12–28)
BUN: 20 mg/dL (ref 8–27)
CO2: 21 mmol/L (ref 20–29)
Calcium: 10.1 mg/dL (ref 8.7–10.3)
Chloride: 99 mmol/L (ref 96–106)
Creatinine, Ser: 0.9 mg/dL (ref 0.57–1.00)
Glucose: 111 mg/dL — ABNORMAL HIGH (ref 70–99)
Potassium: 4.9 mmol/L (ref 3.5–5.2)
Sodium: 138 mmol/L (ref 134–144)
eGFR: 66 mL/min/{1.73_m2} (ref >59)

## 2022-11-01 ENCOUNTER — Encounter (HOSPITAL_BASED_OUTPATIENT_CLINIC_OR_DEPARTMENT_OTHER): Payer: Self-pay

## 2022-11-01 NOTE — Telephone Encounter (Signed)
BP log as requested 

## 2022-11-02 ENCOUNTER — Encounter (HOSPITAL_BASED_OUTPATIENT_CLINIC_OR_DEPARTMENT_OTHER): Payer: Medicare Other | Admitting: Cardiology

## 2022-11-07 ENCOUNTER — Telehealth: Payer: Self-pay

## 2022-11-07 NOTE — Telephone Encounter (Signed)
Called to discuss PREP class schedule at [employer], left voicemail

## 2022-11-07 NOTE — Telephone Encounter (Signed)
She returned my call; wants to attend next PREP class at [facility] on 11/27/22, every T/Th 12-11:15; will contact last week of December to set up assessment visit.

## 2022-11-08 ENCOUNTER — Encounter: Payer: Self-pay | Admitting: *Deleted

## 2022-11-14 ENCOUNTER — Telehealth: Payer: Self-pay

## 2022-11-14 NOTE — Telephone Encounter (Signed)
Called to confirm participation in [place] class at [place] on 1/2; left voicemail.

## 2022-11-15 ENCOUNTER — Encounter: Payer: Self-pay | Admitting: Family Medicine

## 2022-11-15 ENCOUNTER — Ambulatory Visit (INDEPENDENT_AMBULATORY_CARE_PROVIDER_SITE_OTHER): Payer: Medicare Other | Admitting: Family Medicine

## 2022-11-15 VITALS — BP 130/70 | HR 89 | Temp 98.0°F | Resp 16 | Ht 63.0 in | Wt 168.4 lb

## 2022-11-15 DIAGNOSIS — K219 Gastro-esophageal reflux disease without esophagitis: Secondary | ICD-10-CM

## 2022-11-15 DIAGNOSIS — I152 Hypertension secondary to endocrine disorders: Secondary | ICD-10-CM

## 2022-11-15 DIAGNOSIS — E785 Hyperlipidemia, unspecified: Secondary | ICD-10-CM

## 2022-11-15 DIAGNOSIS — E119 Type 2 diabetes mellitus without complications: Secondary | ICD-10-CM

## 2022-11-15 DIAGNOSIS — Z794 Long term (current) use of insulin: Secondary | ICD-10-CM

## 2022-11-15 DIAGNOSIS — E1169 Type 2 diabetes mellitus with other specified complication: Secondary | ICD-10-CM

## 2022-11-15 DIAGNOSIS — E1159 Type 2 diabetes mellitus with other circulatory complications: Secondary | ICD-10-CM | POA: Diagnosis not present

## 2022-11-15 DIAGNOSIS — J069 Acute upper respiratory infection, unspecified: Secondary | ICD-10-CM

## 2022-11-15 DIAGNOSIS — E039 Hypothyroidism, unspecified: Secondary | ICD-10-CM

## 2022-11-15 DIAGNOSIS — Z23 Encounter for immunization: Secondary | ICD-10-CM | POA: Diagnosis not present

## 2022-11-15 LAB — HEPATIC FUNCTION PANEL
ALT: 26 U/L (ref 0–35)
AST: 26 U/L (ref 0–37)
Albumin: 4.2 g/dL (ref 3.5–5.2)
Alkaline Phosphatase: 83 U/L (ref 39–117)
Bilirubin, Direct: 0.1 mg/dL (ref 0.0–0.3)
Total Bilirubin: 0.4 mg/dL (ref 0.2–1.2)
Total Protein: 7 g/dL (ref 6.0–8.3)

## 2022-11-15 LAB — CBC WITH DIFFERENTIAL/PLATELET
Basophils Absolute: 0.1 10*3/uL (ref 0.0–0.1)
Basophils Relative: 0.9 % (ref 0.0–3.0)
Eosinophils Absolute: 0.3 10*3/uL (ref 0.0–0.7)
Eosinophils Relative: 2.7 % (ref 0.0–5.0)
HCT: 36.6 % (ref 36.0–46.0)
Hemoglobin: 11.7 g/dL — ABNORMAL LOW (ref 12.0–15.0)
Lymphocytes Relative: 25.4 % (ref 12.0–46.0)
Lymphs Abs: 2.4 10*3/uL (ref 0.7–4.0)
MCHC: 32 g/dL (ref 30.0–36.0)
MCV: 77.3 fl — ABNORMAL LOW (ref 78.0–100.0)
Monocytes Absolute: 0.6 10*3/uL (ref 0.1–1.0)
Monocytes Relative: 6.3 % (ref 3.0–12.0)
Neutro Abs: 6.2 10*3/uL (ref 1.4–7.7)
Neutrophils Relative %: 64.7 % (ref 43.0–77.0)
Platelets: 386 10*3/uL (ref 150.0–400.0)
RBC: 4.73 Mil/uL (ref 3.87–5.11)
RDW: 14.9 % (ref 11.5–15.5)
WBC: 9.6 10*3/uL (ref 4.0–10.5)

## 2022-11-15 LAB — LIPID PANEL
Cholesterol: 127 mg/dL (ref 0–200)
HDL: 33 mg/dL — ABNORMAL LOW (ref >39.00)
LDL Cholesterol: 57 mg/dL (ref 0–99)
NonHDL: 93.7
Total CHOL/HDL Ratio: 4
Triglycerides: 182 mg/dL — ABNORMAL HIGH (ref 0.0–149.0)
VLDL: 36.4 mg/dL (ref 0.0–40.0)

## 2022-11-15 LAB — HEMOGLOBIN A1C: Hgb A1c MFr Bld: 7.7 % — ABNORMAL HIGH (ref 4.6–6.5)

## 2022-11-15 LAB — TSH: TSH: 4.69 u[IU]/mL (ref 0.35–5.50)

## 2022-11-15 LAB — VITAMIN B12: Vitamin B-12: 707 pg/mL (ref 211–911)

## 2022-11-15 MED ORDER — SYMBICORT 160-4.5 MCG/ACT IN AERO: 2.0000 | INHALATION_SPRAY | Freq: Two times a day (BID) | RESPIRATORY_TRACT | 3 refills | Status: DC

## 2022-11-15 MED ORDER — OZEMPIC (2 MG/DOSE) 8 MG/3ML ~~LOC~~ SOPN: 2.0000 mg | PEN_INJECTOR | SUBCUTANEOUS | 3 refills | Status: DC

## 2022-11-15 MED ORDER — ALBUTEROL SULFATE HFA 108 (90 BASE) MCG/ACT IN AERS: INHALATION_SPRAY | RESPIRATORY_TRACT | 1 refills | Status: DC

## 2022-11-15 MED ORDER — AZELASTINE HCL 0.1 % NA SOLN: 1.0000 | Freq: Two times a day (BID) | NASAL | 3 refills | Status: DC

## 2022-11-15 MED ORDER — AZITHROMYCIN 250 MG PO TABS: ORAL_TABLET | ORAL | 0 refills | Status: AC

## 2022-11-15 MED ORDER — FUROSEMIDE 20 MG PO TABS: 20.0000 mg | ORAL_TABLET | Freq: Every day | ORAL | 3 refills | Status: DC | PRN

## 2022-11-15 NOTE — Progress Notes (Signed)
Subjective:     Patient ID: Teresa Rice, female    DOB: [birth year], 76 y.o.   MRN: [id number]  Chief Complaint  Patient presents with   Follow-up    3 month follow-up for dm and HTN     HPI  DM type 2-on metformin 1075m bid, ozempic 2100mweekly., lantus 30u. Sugars 105-160.  Ozempic will need a PA.  Trulicity-no help w/sugars so change to ozempic and did better.  HTN-Pt is on cartia 24067mvalsartan 160.  Bp's running 116-130's/66-80 p80-90.  No ha/dizziness/cp/palp/edema/sob.  Seeing Card.  HLD-on fenofibrate 160 and crestor 63m66mRD-omeprazole 20mg67mdysphagia, no blood.  Woke up this am coughing and chest hurts when coughs.  No inc sob, nose congested.  No f/c.   Hypothyroidism-doing well on meds  Health Maintenance Due  Topic Date Due   Zoster Vaccines- Shingrix (1 of 2) Never done   Medicare Annual Wellness (AWV)  12/15/2021    Past Medical History:  Diagnosis Date   [place]   Recurrent Bladder Cancer--  (urologist-  dr dahlsDiona Fantihronic constipation    Closed fracture of left distal radius 08/30/2016   Full dentures    GERD (gastroesophageal reflux disease)    Hyperlipidemia    Hypertension    Mild intermittent asthma    OA (osteoarthritis)    fingers    Palpitations 09/04/2022   Trigger finger of both hands    wear slints and special gloves at night   Type 2 diabetes mellitus ([facility] Past Surgical History:  Procedure Laterality Date   [place] Left 05/24/2014   Procedure: CYSTOSCOPY/RETROGRADE/ URETEROSCOPY;  Surgeon: StephJorja Loa  Location[facility];  Service: Urology;  Laterality: Left;   TRANSURETHRAL RESECTION OF BLADDER TUMOR N/A 05/24/2014   Procedure: TRANSURETHRAL RESECTION OF BLADDER TUMOR (TURBT) ;  Surgeon: StephJorja Loa  [facility]: [facility];  Service: Urology;  Laterality: N/A;   TRANSURETHRAL  RESECTION OF BLADDER TUMOR N/A 08/04/2015   Procedure: TRANSURETHRAL RESECTION OF BLADDER TUMOR (TURBT);  Surgeon: StephFranchot Gallo  [facility];  Laterality: N/A;    Outpatient Medications Prior to Visit  Medication Sig Dispense Refill   Accu-Chek Softclix Lancets lancets      albuterol (VENTOLIN HFA) 108 (90 Base) MCG/ACT inhaler INHALE 2 PUFFS BY MOUTH EVERY 6 HOURS AS NEEDED FOR WHEEZING OR SHORTNESS OF BREATH 9 g 0   ALPRAZolam (XANAX) 1 MG tablet TAKE 1/2 (ONE-HALF) TABLET BY MOUTH THREE TIMES DAILY AND 3/4 (THREE-FOURTHS) TABLET AT BEDTIME 71 tablet 3   azelastine (ASTELIN) 0.1 % nasal spray Place 1 spray into both nostrils in the morning. Use in each nostril as directed 30 mL 2   B-D UF III MINI PEN NEEDLES 31G X 5 MM MISC SMARTSIG:1 Pen Needle SUB-Q Daily     baclofen (LIORESAL) 10 MG tablet Take 0.5 tablets (5 mg total) by mouth 2 (two) times daily as needed for muscle spasms. 30 each 0   blood glucose meter kit and supplies Dispense based on patient and insurance preference. Use up to four times daily as directed. (FOR ICD-10 E10.9, E11.9). 1 each 0   Calcium Carbonate (CALCIUM 600 PO) Take 1 tablet by mouth daily in the afternoon.     cholecalciferol (VITAMIN D3) 25 MCG (1000 UNIT) tablet  Take 1,000 Units by mouth daily.     diltiazem (CARTIA XT) 240 MG 24 hr capsule Take 1 capsule (240 mg total) by mouth daily. 90 capsule 1   fenofibrate 160 MG tablet Take 1 tablet by mouth once daily 90 tablet 3   furosemide (LASIX) 20 MG tablet Take 20 mg by mouth daily as needed.     glucose blood test strip Use as instructed 100 each 12   LANTUS SOLOSTAR 100 UNIT/ML Solostar Pen INJECT 15 UNITS SUBCUTANEOUSLY ONCE DAILY AT NIGHT 15 mL 0   levothyroxine (SYNTHROID) 25 MCG tablet Take one pill daily for thyroid 90 tablet 3   magnesium oxide (MAG-OX) 400 MG tablet Take 400 mg by mouth 2 (two) times daily.     metFORMIN (GLUCOPHAGE) 1000 MG tablet  Take 1 tablet (1,000 mg total) by mouth 2 (two) times daily with a meal. 180 tablet 3   Multiple Vitamin (MULTIVITAMIN WITH MINERALS) TABS tablet Take 1 tablet by mouth daily.     nystatin cream (MYCOSTATIN) Apply topically.     Omega 3 1000 MG CAPS Take 1,000 mg by mouth daily.     omeprazole (PRILOSEC) 20 MG capsule Take 1 capsule (20 mg total) by mouth daily. 90 capsule 3   Polyethylene Glycol 3350 (MIRALAX PO) Take by mouth every evening.     rosuvastatin (CRESTOR) 10 MG tablet Take 1 tablet (10 mg total) by mouth daily. 90 tablet 3   Semaglutide, 2 MG/DOSE, (OZEMPIC, 2 MG/DOSE,) 8 MG/3ML SOPN Inject 2 mg into the skin once a week. 3 mL 5   SYMBICORT 160-4.5 MCG/ACT inhaler Inhale 2 puffs into the lungs 2 (two) times daily. 11 g 11   traMADol (ULTRAM) 50 MG tablet Take 1 tablet by mouth every 6 (six) hours as needed.     triamcinolone cream (KENALOG) 0.1 % Apply topically 3 (three) times daily.     valsartan (DIOVAN) 160 MG tablet Take 1 tablet (160 mg total) by mouth daily. 90 tablet 1   Vilazodone HCl 20 MG TABS Take 1 tablet (20 mg total) by mouth daily. 90 tablet 1   VITAMIN E PO Take by mouth.     B-D ULTRAFINE III SHORT PEN 31G X 8 MM MISC Inject into the skin.     No facility-administered medications prior to visit.    Allergies  Allergen Reactions   Celebrex [Celecoxib] Anaphylaxis   Glipizide Itching   Penicillins Other (See Comments)    "Burn marks from inside out"    Sulfa Antibiotics Itching   ROS neg/noncontributory except as noted HPI/below      Objective:     BP 130/70   Pulse 89   Temp 98 F (36.7 C) (Temporal)   Resp 16   Ht _0  (1.6 m)   Wt 168 lb 6 oz (76.4 kg)   LMP  (LMP Unknown)   SpO2 96%   BMI 29.83 kg/m  Wt Readings from Last 3 Encounters:  11/15/22 168 lb 6 oz (76.4 kg)  10/16/22 171 lb (77.6 kg)  09/04/22 172 lb 9.6 oz (78.3 kg)    Physical Exam   Gen: WDWN NAD HEENT: NCAT, conjunctiva not injected, sclera nonicteric NECK:   supple, no thyromegaly, no nodes, no carotid bruits CARDIAC: RRR, S1S2+, no murmur. DP 2+B LUNGS: CTAB. No wheezes ABDOMEN:  BS+, soft, NTND, No HSM, no masses EXT:  no edema MSK: no gross abnormalities.  NEURO: A&O x3.  CN II-XII intact.  PSYCH: normal mood. Good  eye contact     Assessment & Plan:   Problem List Items Addressed This Visit       Cardiovascular and Mediastinum   Hypertension associated with diabetes ([place])     Digestive   Gastroesophageal reflux disease     Endocrine   Acquired hypothyroidism   Type 2 diabetes mellitus with insulin therapy ([employer]) - Primary   Hyperlipidemia associated with type 2 diabetes mellitus ([place])   Other Visit Diagnoses     Need for immunization against influenza       Relevant Orders   Flu Vaccine QUAD High Dose(Fluad) (Completed)   Viral upper respiratory tract infection          DM type 2-controlled.  Cont ozempic 61m weekly, lantus 30u, metformin 10077mbid.  Check A1c and B12 HTN-chronic.  Controlled.  Cont meds per Card.  Reviewed bmp.  Check cbc HLD-mixed.  Cont fenofibrate 16049mnd crestor 20m96mCheck lipids, lft's Hypothyroidism-chronic.  Controlled on 0.025mg64mheck tsh GERD-chronic.  Controlled.  Cont omepraxole 20mg.88meck B12, cbc URI-pt w/copd.  Will renew albuterol and start zpk.  F/u 3 mo  No orders of the defined types were placed in this encounter.   Mikhai Bienvenue M [place]

## 2022-11-15 NOTE — Patient Instructions (Signed)
It was very nice to see you today!  Merry Christmas!  While on zpack-hold crestor(cholesterol med).    PLEASE NOTE:  If you had any lab tests please let us know if you have not heard back within a few days. You may see your results on MyChart before we have a chance to review them but we will give you a call once they are reviewed by [place]. If we ordered any referrals today, please let us know if you have not heard from their office within the next week.   Please try these tips to maintain a healthy lifestyle:  Eat most of your calories during the day when you are active. Eliminate processed foods including packaged sweets (pies, cakes, cookies), reduce intake of potatoes, white bread, white pasta, and white rice. Look for whole grain options, oat flour or almond flour.  Each meal should contain half fruits/vegetables, one quarter protein, and one quarter carbs (no bigger than a computer mouse).  Cut down on sweet beverages. This includes juice, soda, and sweet tea. Also watch fruit intake, though this is a healthier sweet option, it still contains natural sugar! Limit to 3 servings daily.  Drink at least 1 glass of water with each meal and aim for at least 8 glasses per day  Exercise at least 150 minutes every week.

## 2022-11-15 NOTE — Progress Notes (Signed)
Labs are stable.  A1C at goal of <8 but could be a little better. Can increase lantus to 32 units or keep as is and work on diet/exercise more

## 2022-11-20 ENCOUNTER — Telehealth: Payer: Self-pay

## 2022-11-21 NOTE — Telephone Encounter (Signed)
Missed PREP assessment appointment yesterday at 2pm; sent text message and called leaving voicemail.

## 2022-11-27 ENCOUNTER — Telehealth: Payer: Self-pay | Admitting: Psychiatry

## 2022-11-27 ENCOUNTER — Other Ambulatory Visit: Payer: Self-pay

## 2022-11-27 DIAGNOSIS — F4310 Post-traumatic stress disorder, unspecified: Secondary | ICD-10-CM

## 2022-11-27 DIAGNOSIS — E1165 Type 2 diabetes mellitus with hyperglycemia: Secondary | ICD-10-CM | POA: Diagnosis not present

## 2022-11-27 DIAGNOSIS — F4001 Agoraphobia with panic disorder: Secondary | ICD-10-CM

## 2022-11-27 MED ORDER — ALPRAZOLAM 1 MG PO TABS: ORAL_TABLET | ORAL | 0 refills | Status: DC

## 2022-11-27 NOTE — Telephone Encounter (Signed)
Pended.

## 2022-11-27 NOTE — Telephone Encounter (Signed)
Pt LVM @ 10:49a.  She wants refill on Xanax.  She said she hasn't had any since Nov.  I told her it showed she has refills, but she said they told her to call us.  Next appt 1/22

## 2022-12-13 ENCOUNTER — Telehealth: Payer: Self-pay

## 2022-12-13 NOTE — Telephone Encounter (Signed)
Called to discuss PREP class at [facility], she had committed to attend 11/27/22 class, but did not show up for assessment visit; asked if she was interested in attending class, she replied "no" and hung up.

## 2022-12-17 ENCOUNTER — Ambulatory Visit: Payer: Medicare Other | Admitting: Psychiatry

## 2022-12-28 DIAGNOSIS — E1165 Type 2 diabetes mellitus with hyperglycemia: Secondary | ICD-10-CM | POA: Diagnosis not present

## 2023-01-04 NOTE — Progress Notes (Signed)
Cardiology Office Note:    Date:  01/04/2023   ID:  Teresa Rice, DOB 1946-03-03, MRN [id number]  PCP:  Tawnya Crook, MD   [facility] Providers Cardiologist:  Skeet Latch, MD     Referring MD: Tawnya Crook, MD   No chief complaint on file.   History of Present Illness:    Teresa Rice is a 77 y.o. female with a hx of hypertension, hyperlipidemia, type 2 diabetes mellitus, GERD, asthma, and bladder cancer, here for the evaluation of palpitations at the request of Dr. Cherlynn Kaiser. She saw her PCP Dr. Cherlynn Kaiser on 07/12/2022 where she reported palpitations, and heart rates as high as 144 bpm with minimal walking. Her EKG showed PAC's, rate 94, and no ST changes. She was on metoprolol XL 50 mg. At her follow-up on 07/19/2022 her metoprolol was increased to 75 mg and she was referred to cardiology for further evaluation.  Today, she reports that her initial episode of irregular heart rates was a couple months ago. At that time she was getting out of bed, her lips felt numb, and she felt as though she was having an anxiety attack. She has struggled with panic attacks for years. They are sometimes associated with chest discomfort or pressure. Lately she is feeling irregular heartbeats every day. Currently she is taking 75 mg metoprolol; however after taking this her body feels drained and she is very drowsy. Since the onset of her irregular heart rates she has switched to drinking 1 cup of decaf coffee. Brief activity such as walking to the restroom will elevate her heart rate. After siting on the couch and relaxing her heart rate will begin to slow down. She states that while playing with her great grandchildren her heart rate increases but she otherwise feels fine. Yesterday she joined the gym; she enjoys walking on the treadmill. She had been advised to back off if her heart rate increases too much on the treadmill. A couple days ago her at home blood pressure was 135/72. Last night she  knew her blood pressure was elevated because she had the worst headache. She endorses swelling in her bilateral feet. She takes furosemide every day. Currently she is wearing compression socks, but she does not always wear them. She denies any lightheadedness, syncope, orthopnea, or PND.  Today,  She denies any palpitations, chest pain, shortness of breath, or peripheral edema. No lightheadedness, headaches, syncope, orthopnea, or PND.  (+)  ***Plan: -  Past Medical History:  Diagnosis Date   Bladder cancer ([place]) first dx 2012   Recurrent Bladder Cancer--  (urologist-  dr Diona Fanti)   Chronic constipation    Closed fracture of left distal radius 08/30/2016   Full dentures    GERD (gastroesophageal reflux disease)    Hyperlipidemia    Hypertension    Mild intermittent asthma    OA (osteoarthritis)    fingers    Palpitations 09/04/2022   Trigger finger of both hands    wear slints and special gloves at night   Type 2 diabetes mellitus [facility])     Past Surgical History:  Procedure Laterality Date   BLADDER SURGERY  2012   in [place]   CYSTOSCOPY/RETROGRADE/URETEROSCOPY Left 05/24/2014   Procedure: CYSTOSCOPY/RETROGRADE/ URETEROSCOPY;  Surgeon: Jorja Loa, MD;  Location: [facility];  Service: Urology;  Laterality: Left;   TRANSURETHRAL RESECTION OF BLADDER TUMOR N/A 05/24/2014   Procedure: TRANSURETHRAL RESECTION OF BLADDER TUMOR (TURBT) ;  Surgeon: Jorja Loa, MD;  Location: [facility];  Service: Urology;  Laterality: N/A;   TRANSURETHRAL RESECTION OF BLADDER TUMOR N/A 08/04/2015   Procedure: TRANSURETHRAL RESECTION OF BLADDER TUMOR (TURBT);  Surgeon: Franchot Gallo, MD;  Location: [facility];  Service: Urology;  Laterality: N/A;    Current Medications: No outpatient medications have been marked as taking for the 01/07/23 encounter (Appointment) with Skeet Latch, MD.     Allergies:   Celebrex [celecoxib],  Glipizide, Penicillins, and Sulfa antibiotics   Social History   Socioeconomic History   Marital status: Widowed    Spouse name: Not on file   Number of children: 1   Years of education: 45   Highest education level: Not on file  Occupational History   Occupation: retired  Tobacco Use   Smoking status: Former    Years: 30.00    Types: Cigarettes    Quit date: 05/21/1994    Years since quitting: 28.6   Smokeless tobacco: Never  Vaping Use   Vaping Use: Never used  Substance and Sexual Activity   Alcohol use: Yes    Comment: RARE   Drug use: No   Sexual activity: Not on file  Other Topics Concern   Not on file  Social History Narrative   Lives in an apartment, lives alone   One daughter   Right handed    Retired from [employer] work   Highest level of education:  GED   Social Determinants of [place] Strain: [place]  (12/15/2020)   Overall Financial Resource Strain (CARDIA)    Difficulty of Paying Living Expenses: Not hard at [place]: No [place] (12/15/2020)   Hunger Vital Sign    Worried About Running Out of Food in the Last Year: Never true    [place] in the Last Year: Never true  Transportation Needs: No Transportation Needs (12/15/2020)   PRAPARE - Hydrologist (Medical): No    Lack of Transportation (Non-Medical): No  Physical Activity: Inactive (12/15/2020)   Exercise Vital Sign    Days of Exercise per Week: 0 days    Minutes of Exercise per Session: 0 min  Stress: No Stress Concern Present (12/15/2020)   [place]    Feeling of Stress : Only a little  Social Connections: Moderately Isolated (12/15/2020)   Social Connection and Isolation Panel [NHANES]    Frequency of Communication with Friends and Family: More than three times a week    Frequency of Social Gatherings with Friends and Family: Never    Attends Religious  Services: More than 4 times per year    Active Member of [employer] or Organizations: No    Attends Archivist Meetings: Never    Marital Status: Widowed     Family History: The patient's family history includes AAA (abdominal aortic aneurysm) in her sister; Cancer in her brother and father; Diabetes in her brother; Early death in her mother.  ROS:   Please see the history of present illness.     All other systems reviewed and are negative.  EKGs/Labs/Other Studies Reviewed:    The following studies were reviewed today:  Monitor  10/2022: 10 Day Zio Monitor   Quality: Fair.  Baseline artifact. Predominant rhythm: sinus rhythm Average heart rate: 93 bpm Max heart rate: 136 bpm Min heart rate: 73 bpm Pauses >2.5 seconds: none Occasional (1%) PACs.  Rare PVCs  Echo  09/19/2022:  1. Left ventricular ejection fraction, by estimation, is 55 to 60%. The  left ventricle has normal function. The left ventricle has no regional  wall motion abnormalities. There is mild asymmetric left ventricular  hypertrophy of the basal-septal segment.  Left ventricular diastolic parameters are consistent with Grade I  diastolic dysfunction (impaired relaxation).   2. Right ventricular systolic function is normal. The right ventricular  size is normal. Tricuspid regurgitation signal is inadequate for assessing  PA pressure.   3. The mitral valve is normal in structure. Mild mitral valve  regurgitation.   4. The aortic valve is tricuspid. There is mild calcification of the  aortic valve. There is mild thickening of the aortic valve. Aortic valve  regurgitation is not visualized. Aortic valve sclerosis/calcification is  present, without any evidence of  aortic stenosis.   5. The inferior vena cava is normal in size with greater than 50%  respiratory variability, suggesting right atrial pressure of 3 mmHg.   Comparison(s): No prior Echocardiogram.   LE Venous Doppler   09/19/2005: Findings:   Complete compressibility is demonstrated throughout the visualized deep veins. No venous filling defects are identified by gray-scale or color Doppler sonography. Doppler waveforms show normal direction of venous flow with normal phasicity and response to augmentation.  IMPRESSION:  Negative. No evidence of deep venous thrombosis.    EKG:   EKG is personally reviewed. 01/07/2023: 09/04/2022: Sinus tachycardia. Rate 102 bpm. Incomplete RBBB. Nonspecific T wave abnormalities.  Recent Labs: 07/12/2022: Magnesium 1.4 10/30/2022: BUN 20; Creatinine, Ser 0.90; Potassium 4.9; Sodium 138 11/15/2022: ALT 26; Hemoglobin 11.7; Platelets 386.0; TSH 4.69   Recent Lipid Panel    Component Value Date/Time   CHOL 127 11/15/2022 1028   TRIG 182.0 (H) 11/15/2022 1028   HDL 33.00 (L) 11/15/2022 1028   CHOLHDL 4 11/15/2022 1028   VLDL 36.4 11/15/2022 1028   LDLCALC 57 11/15/2022 1028   LDLDIRECT 75.0 07/12/2022 1029     Risk Assessment/Calculations:      STOP-Bang Score:  5  { Consider Dx Sleep Disordered Breathing or Sleep Apnea  ICD G47.33          :1}     Physical Exam:    Wt Readings from Last 3 Encounters:  11/15/22 168 lb 6 oz (76.4 kg)  10/16/22 171 lb (77.6 kg)  09/04/22 172 lb 9.6 oz (78.3 kg)     VS:  LMP  (LMP Unknown)  , BMI There is no height or weight on file to calculate BMI. GENERAL:  Well appearing HEENT: Pupils equal round and reactive, fundi not visualized, oral mucosa unremarkable NECK:  No jugular venous distention, waveform within normal limits, carotid upstroke brisk and symmetric, no bruits, no thyromegaly LUNGS:  Clear to auscultation bilaterally HEART:  RRR.  PMI not displaced or sustained,S1 and S2 within normal limits, no S3, no S4, no clicks, no rubs, no murmurs ABD:  Flat, positive bowel sounds normal in frequency in pitch, no bruits, no rebound, no guarding, no midline pulsatile mass, no hepatomegaly, no splenomegaly EXT:  2 plus  pulses throughout, no edema, no cyanosis no clubbing SKIN:  No rashes no nodules NEURO:  Cranial nerves II through XII grossly intact, motor grossly intact throughout PSYCH:  Cognitively intact, oriented to person place and time   ASSESSMENT:    No diagnosis found.  PLAN:    No problem-specific Assessment & Plan notes found for this encounter.   Disposition: FU with APP  in 1 month. FU with Orlinda Slomski C. Oval Linsey, MD, [facility] in ***3-4 months.  Medication Adjustments/Labs and Tests Ordered: Current medicines are reviewed at length with the patient today.  Concerns regarding medicines are outlined above.   No orders of the defined types were placed in this encounter.  No orders of the defined types were placed in this encounter.  There are no Patient Instructions on file for this visit.   I,Mathew Stumpf,acting as a Education administrator for Skeet Latch, MD.,have documented all relevant documentation on the behalf of Skeet Latch, MD,as directed by  Skeet Latch, MD while in the presence of Skeet Latch, MD.  I, [place] Oval Linsey, MD have reviewed all documentation for this visit.  The documentation of the exam, diagnosis, procedures, and orders on 01/04/2023 are all accurate and complete.  Waynetta Pean  01/04/2023 3:28 PM    [facility]

## 2023-01-07 ENCOUNTER — Encounter (HOSPITAL_BASED_OUTPATIENT_CLINIC_OR_DEPARTMENT_OTHER): Payer: Self-pay | Admitting: Cardiovascular Disease

## 2023-01-07 ENCOUNTER — Ambulatory Visit (INDEPENDENT_AMBULATORY_CARE_PROVIDER_SITE_OTHER): Payer: 59 | Admitting: Cardiovascular Disease

## 2023-01-07 VITALS — BP 116/62 | HR 114 | Ht 63.0 in | Wt 167.1 lb

## 2023-01-07 DIAGNOSIS — I4711 Inappropriate sinus tachycardia, so stated: Secondary | ICD-10-CM

## 2023-01-07 DIAGNOSIS — E785 Hyperlipidemia, unspecified: Secondary | ICD-10-CM | POA: Diagnosis not present

## 2023-01-07 DIAGNOSIS — E1169 Type 2 diabetes mellitus with other specified complication: Secondary | ICD-10-CM | POA: Diagnosis not present

## 2023-01-07 DIAGNOSIS — E1159 Type 2 diabetes mellitus with other circulatory complications: Secondary | ICD-10-CM | POA: Diagnosis not present

## 2023-01-07 DIAGNOSIS — I152 Hypertension secondary to endocrine disorders: Secondary | ICD-10-CM | POA: Diagnosis not present

## 2023-01-07 HISTORY — DX: Inappropriate sinus tachycardia, so stated: I47.11

## 2023-01-07 MED ORDER — VALSARTAN 160 MG PO TABS: 160.0000 mg | ORAL_TABLET | Freq: Every day | ORAL | 1 refills | Status: DC

## 2023-01-07 MED ORDER — METOPROLOL SUCCINATE ER 25 MG PO TB24: 25.0000 mg | ORAL_TABLET | Freq: Every day | ORAL | 3 refills | Status: DC

## 2023-01-07 MED ORDER — DILTIAZEM HCL ER COATED BEADS 240 MG PO CP24: 240.0000 mg | ORAL_CAPSULE | Freq: Every day | ORAL | 1 refills | Status: DC

## 2023-01-07 NOTE — Assessment & Plan Note (Addendum)
Blood pressure is well-controlled on diltiazem and valsartan.  We will add back metoprolol succinate 67m daily for improved hearrt rate control.

## 2023-01-07 NOTE — Assessment & Plan Note (Addendum)
Lipids are well-controlled.  Continue fenofibrate and statin.  Increase exercise once feeling better.

## 2023-01-07 NOTE — Patient Instructions (Signed)
Medication Instructions:  START METOPROLOL SUC 25 MG DAILY   *If you need a refill on your cardiac medications before your next appointment, please call your pharmacy*  Lab Work: NONE  Testing/Procedures: NONE  Follow-Up: At [facility], you and your health needs are our priority.  As part of our continuing mission to provide you with exceptional heart care, we have created designated Provider Care Teams.  These Care Teams include your primary Cardiologist (physician) and Advanced Practice Providers (APPs -  Physician Assistants and Nurse Practitioners) who all work together to provide you with the care you need, when you need it.  We recommend signing up for the patient portal called "MyChart".  Sign up information is provided on this After Visit Summary.  MyChart is used to connect with patients for Virtual Visits (Telemedicine).  Patients are able to view lab/test results, encounter notes, upcoming appointments, etc.  Non-urgent messages can be sent to your provider as well.   To learn more about what you can do with MyChart, go to [email or web address].    Your next appointment:   2-3 month(s)  Provider:   Laurann [place], NP

## 2023-01-07 NOTE — Assessment & Plan Note (Signed)
Teresa Rice continues to have episodes of tachycardia as well as PACs and PVCs.  Heart rates today are in the 1 teens.  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  Continue diltiazem.  We will add metoprolol succinate 25 mg daily.  She does not have much more blood pressure room.  We may need to decrease her other antihypertensives if more heart rate control is needed in the future.

## 2023-01-09 ENCOUNTER — Encounter: Payer: Self-pay | Admitting: Family Medicine

## 2023-01-09 ENCOUNTER — Ambulatory Visit (INDEPENDENT_AMBULATORY_CARE_PROVIDER_SITE_OTHER): Payer: 59 | Admitting: Family Medicine

## 2023-01-09 VITALS — BP 120/60 | HR 100 | Temp 97.8°F | Ht 63.0 in | Wt 163.1 lb

## 2023-01-09 DIAGNOSIS — R0981 Nasal congestion: Secondary | ICD-10-CM | POA: Diagnosis not present

## 2023-01-09 DIAGNOSIS — J440 Chronic obstructive pulmonary disease with acute lower respiratory infection: Secondary | ICD-10-CM

## 2023-01-09 DIAGNOSIS — R059 Cough, unspecified: Secondary | ICD-10-CM

## 2023-01-09 DIAGNOSIS — J209 Acute bronchitis, unspecified: Secondary | ICD-10-CM

## 2023-01-09 LAB — POC COVID19 BINAXNOW: SARS Coronavirus 2 Ag: NEGATIVE

## 2023-01-09 LAB — POCT INFLUENZA A/B
Influenza A, POC: NEGATIVE
Influenza B, POC: NEGATIVE

## 2023-01-09 MED ORDER — BENZONATATE 100 MG PO CAPS: 100.0000 mg | ORAL_CAPSULE | Freq: Three times a day (TID) | ORAL | 0 refills | Status: DC | PRN

## 2023-01-09 MED ORDER — CEFDINIR 300 MG PO CAPS: 300.0000 mg | ORAL_CAPSULE | Freq: Two times a day (BID) | ORAL | 0 refills | Status: DC

## 2023-01-09 MED ORDER — PREDNISONE 20 MG PO TABS: 40.0000 mg | ORAL_TABLET | Freq: Every day | ORAL | 0 refills | Status: AC

## 2023-01-09 NOTE — Patient Instructions (Addendum)
Use albuterol every 4-6 hours as needed.  Take prednisone daily-but if sugars going too high, stop-start on Friday if not doing better.   Sent in [place] for cough Antibiotics.

## 2023-01-09 NOTE — Progress Notes (Signed)
Subjective:     Patient ID: Teresa Rice, female    DOB: 02-Feb-1946, 77 y.o.   MRN: [id number]  Chief Complaint  Patient presents with   Cough    Productive cough with green mucus Sx started 3 or 4 days ago   Nasal Congestion    HPI Cough, congestion x3-4 days.   Saw Card few days ago and told cough not good for heart. Yesterday, wheeze.  H/o asthma.  No f/c.  V/d.  Covid neg yesterday. Occ myalgia.  No energy.  Using albuterol bid since sick.   Can take amox.  Grandson had, then dau, then granddau-2 wks ago Doing symbicort 2 puffs bid  Card started metoprolol 29m-pt started yesterday.  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   Mild intermittent asthma    OA (osteoarthritis)    fingers    Palpitations 09/04/2022   Trigger finger of both hands    wear slints and special gloves at night   Type 2 diabetes mellitus ([facility]     Past Surgical History:  Procedure Laterality Date   [place]  CYSTOSCOPY/RETROGRADE/URETEROSCOPY Left 05/24/2014   Procedure: CYSTOSCOPY/RETROGRADE/ URETEROSCOPY;  Surgeon: SJorja Loa MD;  [facility];  Service: Urology;  Laterality: Left;   TRANSURETHRAL RESECTION OF BLADDER TUMOR N/A 05/24/2014   Procedure: TRANSURETHRAL RESECTION OF BLADDER TUMOR (TURBT) ;  Surgeon: SJorja Loa MD;  [facility];  Service: Urology;  Laterality: N/A;   TRANSURETHRAL RESECTION OF BLADDER TUMOR N/A 08/04/2015   Procedure:  TRANSURETHRAL RESECTION OF BLADDER TUMOR (TURBT);  Surgeon: SFranchot Gallo MD;  [facility]  Service: Urology;  Laterality: N/A;    Outpatient Medications Prior to Visit  Medication Sig Dispense Refill   Accu-Chek Softclix Lancets lancets      albuterol (VENTOLIN HFA) 108 (90 Base) MCG/ACT inhaler INHALE 2 PUFFS BY MOUTH EVERY 6 HOURS AS NEEDED FOR WHEEZING OR SHORTNESS OF BREATH 9 g 1   ALPRAZolam (XANAX) 1 MG tablet TAKE 1/2 (ONE-HALF) TABLET BY MOUTH THREE TIMES DAILY AND 3/4 (THREE-FOURTHS) TABLET AT BEDTIME 71 tablet 0   azelastine (ASTELIN) 0.1 % nasal spray Place 1 spray into both nostrils 2 (two) times daily. Use in each nostril as directed 90 mL 3   B-D UF III MINI PEN NEEDLES 31G X 5 MM MISC SMARTSIG:1 Pen Needle SUB-Q Daily     baclofen (LIORESAL) 10 MG tablet Take 0.5 tablets (5 mg total) by mouth 2 (two) times daily as needed for muscle spasms. 30 each 0   blood glucose meter kit and supplies Dispense based on patient and insurance preference. Use up to four times daily as directed. (FOR ICD-10 E10.9, E11.9). 1 each 0   Calcium Carbonate (CALCIUM 600 PO) Take 1 tablet by mouth daily in the afternoon.     cholecalciferol (VITAMIN D3) 25 MCG (1000 UNIT) tablet Take 1,000 Units by  mouth daily.     diltiazem (CARTIA XT) 240 MG 24 hr capsule Take 1 capsule (240 mg total) by mouth daily. 90 capsule 1   fenofibrate 160 MG tablet Take 1 tablet by mouth once daily 90 tablet 3   furosemide (LASIX) 20 MG tablet Take 1 tablet (20 mg total) by mouth daily as needed. 30 tablet 3   glucose blood test strip Use as instructed 100 each 12   LANTUS SOLOSTAR 100 UNIT/ML Solostar Pen INJECT 15 UNITS SUBCUTANEOUSLY ONCE DAILY AT NIGHT 15 mL 0   levothyroxine (SYNTHROID) 25 MCG tablet Take one pill daily for thyroid 90 tablet 3   magnesium oxide (MAG-OX) 400 MG tablet Take 400 mg by mouth 2 (two) times daily.     metFORMIN (GLUCOPHAGE) 1000 MG tablet Take 1 tablet (1,000 mg  total) by mouth 2 (two) times daily with a meal. 180 tablet 3   metoprolol succinate (TOPROL XL) 25 MG 24 hr tablet Take 1 tablet (25 mg total) by mouth daily. 90 tablet 3   Multiple Vitamin (MULTIVITAMIN WITH MINERALS) TABS tablet Take 1 tablet by mouth daily.     nystatin cream (MYCOSTATIN) Apply topically.     Omega 3 1000 MG CAPS Take 1,000 mg by mouth daily.     omeprazole (PRILOSEC) 20 MG capsule Take 1 capsule (20 mg total) by mouth daily. 90 capsule 3   Polyethylene Glycol 3350 (MIRALAX PO) Take by mouth every evening.     rosuvastatin (CRESTOR) 10 MG tablet Take 1 tablet (10 mg total) by mouth daily. 90 tablet 3   Semaglutide, 2 MG/DOSE, (OZEMPIC, 2 MG/DOSE,) 8 MG/3ML SOPN Inject 2 mg into the skin once a week. 9 mL 3   SYMBICORT 160-4.5 MCG/ACT inhaler Inhale 2 puffs into the lungs 2 (two) times daily. 3 each 3   traMADol (ULTRAM) 50 MG tablet Take 1 tablet by mouth every 6 (six) hours as needed.     triamcinolone cream (KENALOG) 0.1 % Apply topically 3 (three) times daily.     valsartan (DIOVAN) 160 MG tablet Take 1 tablet (160 mg total) by mouth daily. 90 tablet 1   Vilazodone HCl 20 MG TABS Take 1 tablet (20 mg total) by mouth daily. 90 tablet 1   VITAMIN E PO Take by mouth.     No facility-administered medications prior to visit.    Allergies  Allergen Reactions   Celebrex [Celecoxib] Anaphylaxis   Glipizide Itching   Penicillins Other (See Comments)    "Burn marks from inside out"    Sulfa Antibiotics Itching   ROS neg/noncontributory except as noted HPI/below      Objective:     BP 120/60   Pulse 100   Temp 97.8 F (36.6 C) (Temporal)   Ht 5' 3"$  (1.6 m)   Wt 163 lb 2 oz (74 kg)   LMP  (LMP Unknown)   SpO2 96%   BMI 28.90 kg/m  Wt Readings from Last 3 Encounters:  01/09/23 163 lb 2 oz (74 kg)  01/07/23 167 lb 1.6 oz (75.8 kg)  11/15/22 168 lb 6 oz (76.4 kg)    Physical Exam   Gen: WDWN NAD HEENT: NCAT, conjunctiva not injected, sclera  nonicteric TM WNL B, OP moist, no exudates congested NECK:  supple, no thyromegaly, no nodes CARDIAC: RRR, tachyS1S2+ LUNGS: CTAB. Some exp wheezes EXT:  no edema MSK: no gross abnormalities.  NEURO: A&O x3.  CN II-XII intact. PSYCH: normal mood. Good eye contact  Results for  orders placed or performed in visit on 01/09/23  POCT Influenza A/B  Result Value Ref Range   Influenza A, POC Negative Negative   Influenza B, POC Negative Negative  POC COVID-19  Result Value Ref Range   SARS Coronavirus 2 Ag Negative Negative         Assessment & Plan:   Problem List Items Addressed This Visit   None Visit Diagnoses     Acute bronchitis with COPD ([place])    -  Primary   Relevant Medications   benzonatate (TESSALON PERLES) 100 MG capsule   predniSONE (DELTASONE) 20 MG tablet   Head congestion       Relevant Orders   POCT Influenza A/B   POC COVID-19   Cough, unspecified type       Relevant Orders   POCT Influenza A/B   POC COVID-19      Bronchitis/asthma/copd-contributing now to tachycardia.  Omnicef 376m bid, pred 454m tessalon perles 100tid prn, albuterol q4-6hrs prn.   May flare tachycardia/DM but pt wheezing/coughing bad  Meds ordered this encounter  Medications   benzonatate (TESSALON PERLES) 100 MG capsule    Sig: Take 1 capsule (100 mg total) by mouth 3 (three) times daily as needed for cough.    Dispense:  20 capsule    Refill:  0   cefdinir (OMNICEF) 300 MG capsule    Sig: Take 1 capsule (300 mg total) by mouth 2 (two) times daily.    Dispense:  14 capsule    Refill:  0   predniSONE (DELTASONE) 20 MG tablet    Sig: Take 2 tablets (40 mg total) by mouth daily with breakfast for 5 days.    Dispense:  10 tablet    Refill:  0    AnWellington HampshireMD

## 2023-01-10 ENCOUNTER — Telehealth (HOSPITAL_BASED_OUTPATIENT_CLINIC_OR_DEPARTMENT_OTHER): Payer: Self-pay | Admitting: Cardiovascular Disease

## 2023-01-10 MED ORDER — METOPROLOL SUCCINATE ER 25 MG PO TB24: 25.0000 mg | ORAL_TABLET | Freq: Every day | ORAL | 3 refills | Status: DC

## 2023-01-10 NOTE — Telephone Encounter (Signed)
Pt c/o medication issue:  1. Name of Medication:   metoprolol succinate (TOPROL XL) 25 MG 24 hr tablet   2. How are you currently taking this medication (dosage and times per day)? As prescribed  3. Are you having a reaction (difficulty breathing--STAT)?  No  4. What is your medication issue?   Patient stated she has the 50 mg tablets and has been cutting the tablet in half.  Patient stated when she went to pick up this medication from [facility] they told her the prescription had been canceled.  Patient would like to clarify instructions for this medication.

## 2023-01-21 ENCOUNTER — Other Ambulatory Visit: Payer: Self-pay | Admitting: Family Medicine

## 2023-01-23 ENCOUNTER — Encounter: Payer: Self-pay | Admitting: Psychiatry

## 2023-01-23 ENCOUNTER — Ambulatory Visit (INDEPENDENT_AMBULATORY_CARE_PROVIDER_SITE_OTHER): Payer: 59 | Admitting: Psychiatry

## 2023-01-23 DIAGNOSIS — G4721 Circadian rhythm sleep disorder, delayed sleep phase type: Secondary | ICD-10-CM

## 2023-01-23 DIAGNOSIS — F331 Major depressive disorder, recurrent, moderate: Secondary | ICD-10-CM

## 2023-01-23 DIAGNOSIS — F5105 Insomnia due to other mental disorder: Secondary | ICD-10-CM | POA: Diagnosis not present

## 2023-01-23 DIAGNOSIS — F4310 Post-traumatic stress disorder, unspecified: Secondary | ICD-10-CM

## 2023-01-23 DIAGNOSIS — F4001 Agoraphobia with panic disorder: Secondary | ICD-10-CM | POA: Diagnosis not present

## 2023-01-23 MED ORDER — ALPRAZOLAM 1 MG PO TABS: ORAL_TABLET | ORAL | 5 refills | Status: DC

## 2023-01-23 MED ORDER — VILAZODONE HCL 20 MG PO TABS: 20.0000 mg | ORAL_TABLET | Freq: Every day | ORAL | 1 refills | Status: DC

## 2023-01-23 NOTE — Progress Notes (Signed)
Teresa Rice [id number] February 04, 1946 77 y.o.  Subjective:   Patient ID:  Teresa Rice is a 77 y.o. (DOB 08-Jan-1946) female.  Chief Complaint:  Chief Complaint  Patient presents with   Follow-up   Anxiety   Depression    HPI MADGELINE Rice presents to the office today for follow-up of anxiety.    seen Dec 2020 without med changes though she had previously stopped SSRI AMA.  MD had concerns over LT panic,  05/25/20 appt with the following noted: No vaccine. [place] out of bed.  Hurt herself.  Needs eye surgery and worry about it and DM.  Fights off depression and anxiety.  Feels stressed.   No caffeine.  Not aware if something bothering her.  No full panic.  Not drowsy daytime. .  Also falling asleep on the couch though she doesn't want to do it.  Chest gets tight with anxiety. No weight loss off Paxil but no weight gain either.  Overall is not worse still and pleased with that. 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 Still doing well with the Xanax. Usually 1/2 mg TID and 1 mg HS.  Plan no med changes  11/24/20 appt with following noted: Some issues with D about her past and a son taken from her at 47 yo.  Recent conflict, 2 days before Christmas,  through me for a loop and said things she regrets to her D out of anger.   The 88 yo son will not listen to her or let her tell her side of the story.  Very confusing to me.  It is a hard thing to cope with..  She tried to get court records about it but was told it was a closed case. Good news November 14 was baptized.  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  [place].  Will be another great grandmother will be a little boy.   Due 03/17/20.   Can awaken with chest pressure in the morning but it stops short of full panic.  Did not have it this morning.    Plan no med changes  01/18/2021 phone call complaining of very depression, staying in bed until 11 AM not wanting to  drive, and cannot shut her mind off and asking about an antidepressant.  She had previously been on paroxetine but wanted to stop it.  Therefore we started duloxetine 30 mg 1 daily for 1 week and then 2 daily  03/08/2021 appointment with the following noted: Never started duloxetine bc read about SE of tremor and never took it. The got RX for paroxetine to restart and Had to stop it bc shaking and felt sick going into 3rd week on it.  Tremor too bad. Depression triggered by conversation between estranged son (stolen from me)  and her daughter.  Really upset her.   Now some days more depressed and some days she feels and functions better.  This problem sticks in her head and bothers her.  Thinks son said he hopes she dies.  Haven't talked to son in 13 years. More bad days than good.   Plan rec low dose 10 Lexapro  05/08/2021 appt noted: Taken Lexapro.  Calmer and don't feel upset.  I feel good.  No SE with the med.  Took a little while to help.  Doesn't talk about son.  That was the root of my problem. Patient reports stable mood and denies depressed or irritable moods.  Patient  denies any recent difficulty with anxiety.  Patient denies difficulty with sleep initiation or maintenance. Denies appetite disturbance.  Patient reports that energy and motivation have been good.  Patient denies any difficulty with concentration.  Patient denies any suicidal ideation. Loves 2 great grand-sons Teresa Rice 2, Teresa Rice 9 weeks. Got Covid since here. Cough resolved but still a little tired.  10/11/21 appt noted: Stopped Lexapro bc just wanted to see if I'd be OK and was more irritable.  Then restarted it last month. Sleep a long time.  She is fearful about cuitting it back  Doesn't feels she can do without Xanax or reduce it.  Not markely depressed. Tolerating meds without SE.  No SI  04/10/22 APPT noted: Stopped Lexapro AMA DT tremor hands.  Were so bad it was hard to hold a cup of coffee.  This resolved off the med.   Feels somewhat more irritable off of it.  A little  more depressed off it and is more nervous. Sleep good lately. Still alprazolam same dose Thinks paroxetine caused shakes too.  Plan: Don't change meds on her own..  Tends to want to reduce or stop SSRIs repeatedly and always gets worse.  Has done so again complaining of trmeor with them. Viibryd 10 for a week  then 20 mg daily.  7/20/2 Appt noted; VIibryd good. Takes it with lunch.   More relaxed.  No shaking. Not much depression or irritability.  If feel like my old self. No SE Some tiredness.  Problems with BP meds. Sleep is good. Not a morning person.   Plan: Continue alprazolam 1 mg 1/2 tab TID and 3/4 tab HS Don't change meds on her own..  Tends to want to reduce or stop SSRIs repeatedly and always gets worse.  Has done so again complaining of trmeor with them. Viibryd 20 mg daily helped anxiety and irritability and depression.  01/23/23 appt noted: Couple mos ago panic and passed out in the AM.  Been having BP issues.  Episodes tachycardia. Asthma episodes leading to cough made cardiac px worse. No other panic in last couple of mos. Will lay in bed or couch and awaken 230 AM and then to bed and sleeps late.  Can't get adjusted with sleep cycle chronically.  Laying down a lot and then has frequent awakening.  Maybe 11 hours average.   Depression managed.   Tries to stay busy.  But SOB limits her some.  New PCP Joya Gaskins, [place], geriatric doc  Past Psychiatric Medication Trials:  Paxil 60, sertraline forgetfulness, Lexapro 10 tremor but benefit Viibryd 20 helped without tremor  trazodone, She has been under our care since 1998 and has been on the same dosage of Xanax the whole time and most of the time took paroxetine 60  D on psych med: shakes too on zoloft and others  Review of Systems:  Review of Systems  Constitutional:  Positive for fatigue.  HENT:  Positive for hearing loss and voice change.   Respiratory:   Positive for shortness of breath.   Cardiovascular:  Positive for palpitations.  Musculoskeletal:  Positive for back pain.  Neurological:  Positive for headaches. Negative for tremors.  Psychiatric/Behavioral:  Positive for sleep disturbance. Negative for agitation, behavioral problems, confusion, decreased concentration, dysphoric mood, hallucinations, self-injury and suicidal ideas. The patient is nervous/anxious. The patient is not hyperactive.     Medications: I have reviewed the patient's current medications.  Current Outpatient Medications  Medication Sig Dispense Refill   Accu-Chek Softclix  Lancets lancets      albuterol (VENTOLIN HFA) 108 (90 Base) MCG/ACT inhaler INHALE 2 PUFFS BY MOUTH EVERY 6 HOURS AS NEEDED FOR WHEEZING OR SHORTNESS OF BREATH 9 g 1   azelastine (ASTELIN) 0.1 % nasal spray Place 1 spray into both nostrils 2 (two) times daily. Use in each nostril as directed 90 mL 3   B-D UF III MINI PEN NEEDLES 31G X 5 MM MISC SMARTSIG:1 Pen Needle SUB-Q Daily     baclofen (LIORESAL) 10 MG tablet Take 0.5 tablets (5 mg total) by mouth 2 (two) times daily as needed for muscle spasms. 30 each 0   benzonatate (TESSALON PERLES) 100 MG capsule Take 1 capsule (100 mg total) by mouth 3 (three) times daily as needed for cough. 20 capsule 0   blood glucose meter kit and supplies Dispense based on patient and insurance preference. Use up to four times daily as directed. (FOR ICD-10 E10.9, E11.9). 1 each 0   Calcium Carbonate (CALCIUM 600 PO) Take 1 tablet by mouth daily in the afternoon.     cefdinir (OMNICEF) 300 MG capsule Take 1 capsule (300 mg total) by mouth 2 (two) times daily. 14 capsule 0   cholecalciferol (VITAMIN D3) 25 MCG (1000 UNIT) tablet Take 1,000 Units by mouth daily.     diltiazem (CARTIA XT) 240 MG 24 hr capsule Take 1 capsule (240 mg total) by mouth daily. 90 capsule 1   fenofibrate 160 MG tablet Take 1 tablet by mouth once daily 90 tablet 3   furosemide (LASIX) 20 MG  tablet Take 1 tablet (20 mg total) by mouth daily as needed. 30 tablet 3   glucose blood test strip Use as instructed 100 each 12   LANTUS SOLOSTAR 100 UNIT/ML Solostar Pen INJECT 15 UNITS SUBCUTANEOUSLY ONCE DAILY AT NIGHT 15 mL 0   levothyroxine (SYNTHROID) 25 MCG tablet Take one pill daily for thyroid 90 tablet 3   magnesium oxide (MAG-OX) 400 MG tablet Take 400 mg by mouth 2 (two) times daily.     metFORMIN (GLUCOPHAGE) 1000 MG tablet Take 1 tablet (1,000 mg total) by mouth 2 (two) times daily with a meal. 180 tablet 3   metoprolol succinate (TOPROL XL) 25 MG 24 hr tablet Take 1 tablet (25 mg total) by mouth daily. 90 tablet 3   Multiple Vitamin (MULTIVITAMIN WITH MINERALS) TABS tablet Take 1 tablet by mouth daily.     nystatin cream (MYCOSTATIN) Apply topically.     Omega 3 1000 MG CAPS Take 1,000 mg by mouth daily.     omeprazole (PRILOSEC) 20 MG capsule Take 1 capsule (20 mg total) by mouth daily. 90 capsule 3   Polyethylene Glycol 3350 (MIRALAX PO) Take by mouth every evening.     rosuvastatin (CRESTOR) 10 MG tablet Take 1 tablet by mouth once daily 90 tablet 1   Semaglutide, 2 MG/DOSE, (OZEMPIC, 2 MG/DOSE,) 8 MG/3ML SOPN Inject 2 mg into the skin once a week. 9 mL 3   SYMBICORT 160-4.5 MCG/ACT inhaler Inhale 2 puffs into the lungs 2 (two) times daily. 3 each 3   traMADol (ULTRAM) 50 MG tablet Take 1 tablet by mouth every 6 (six) hours as needed.     triamcinolone cream (KENALOG) 0.1 % Apply topically 3 (three) times daily.     valsartan (DIOVAN) 160 MG tablet Take 1 tablet (160 mg total) by mouth daily. 90 tablet 1   VITAMIN E PO Take by mouth.     ALPRAZolam (XANAX) 1 MG  tablet TAKE 1/2 (ONE-HALF) TABLET BY MOUTH THREE TIMES DAILY AND 3/4 (THREE-FOURTHS) TABLET AT BEDTIME 71 tablet 5   Vilazodone HCl 20 MG TABS Take 1 tablet (20 mg total) by mouth daily. 90 tablet 1   No current facility-administered medications for this visit.    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   Mild intermittent asthma    OA (osteoarthritis)    fingers    Palpitations 09/04/2022   Trigger finger of both hands    wear slints and special gloves at night   Type 2 diabetes mellitus ([place])     Family History  Problem Relation Age of Onset   Early death Mother    AAA (abdominal aortic aneurysm) Sister    Cancer Father    Diabetes Brother    Cancer Brother     Social History   Socioeconomic History   Marital status: Widowed    Spouse name: Not on file   Number of children: 1   Years of education: 64   Highest education level: Not on file  Occupational History   Occupation: retired  Tobacco Use   Smoking status: Former    Years: 30.00    Types: Cigarettes    Quit date: 05/21/1994    Years since quitting: 28.6   Smokeless tobacco: Never  Vaping Use   Vaping Use: Never used  Substance and Sexual Activity   Alcohol use: Yes    Comment: RARE   Drug use: No   Sexual activity: Not on file  Other Topics Concern   Not on file  Social History Narrative   Lives in an apartment, lives alone   One daughter   Right handed    Retired from [employer] work   Highest level of education:  GED   Social Determinants of [place] Strain: [place]  (12/15/2020)   Overall Financial Resource Strain (CARDIA)    Difficulty of Paying Living Expenses: Not hard at [place]: No [place] (12/15/2020)   Hunger Vital  Sign    Worried About Running Out of Food in the Last Year: Never true    [place] in the Last Year: Never true  Transportation Needs: No Transportation Needs (12/15/2020)   PRAPARE - Hydrologist (Medical): No    Lack of Transportation (Non-Medical): No  Physical Activity: Inactive (12/15/2020)   Exercise Vital Sign    Days of Exercise per Week: 0 days    Minutes of Exercise per Session: 0 min  Stress: No Stress Concern Present (12/15/2020)   [place]    Feeling of Stress : Only a little  Social Connections: Moderately Isolated (12/15/2020)   Social Connection and Isolation Panel [NHANES]    Frequency of Communication with Friends and Family: More than three times a week  Frequency of Social Gatherings with Friends and Family: Never    Attends Religious Services: More than 4 times per year    Active Member of Clubs or Organizations: No    Attends Archivist Meetings: Never    Marital Status: Widowed  Intimate Partner Violence: Not At Risk (12/15/2020)   Humiliation, Afraid, Rape, and Kick questionnaire    Fear of Current or Ex-Partner: No    Emotionally Abused: No    Physically Abused: No    Sexually Abused: No    Past Medical History, Surgical history, Social history, and Family history were reviewed and updated as appropriate.   2 new hearing aids.  Please see review of systems for further details on the patient's review from today.   Objective:   Physical Exam:  LMP  (LMP Unknown)   Physical Exam Constitutional:      General: She is not in acute distress. Musculoskeletal:        General: No deformity.  Neurological:     Mental Status: She is alert and oriented to person, place, and time.     Motor: No tremor.     Coordination: Coordination normal.     Gait: Gait normal.  Psychiatric:        Attention and Perception: Attention normal. She does not  perceive auditory hallucinations.        Mood and Affect: Mood is anxious. Mood is not depressed. Affect is not labile, blunt, angry, tearful or inappropriate.        Speech: Speech normal.        Behavior: Behavior normal.        Thought Content: Thought content normal. Thought content is not delusional. Thought content does not include homicidal or suicidal ideation. Thought content does not include suicidal plan.        Cognition and Memory: Cognition normal.        Judgment: Judgment normal.     Comments: Insight fair.   No auditory or visual hallucinations. No delusions.  Reads on internet about meds and makes decisions on her on including about medical meds and psych meds.     Lab Review:     Component Value Date/Time   NA 138 10/30/2022 1135   K 4.9 10/30/2022 1135   CL 99 10/30/2022 1135   CO2 21 10/30/2022 1135   GLUCOSE 111 (H) 10/30/2022 1135   GLUCOSE 174 (H) 07/12/2022 1029   BUN 20 10/30/2022 1135   CREATININE 0.90 10/30/2022 1135   CALCIUM 10.1 10/30/2022 1135   PROT 7.0 11/15/2022 1028   ALBUMIN 4.2 11/15/2022 1028   AST 26 11/15/2022 1028   ALT 26 11/15/2022 1028   ALKPHOS 83 11/15/2022 1028   BILITOT 0.4 11/15/2022 1028   GFRNONAA 87 (L) 05/24/2014 0620   GFRAA >90 05/24/2014 0620       Component Value Date/Time   WBC 9.6 11/15/2022 1028   RBC 4.73 11/15/2022 1028   HGB 11.7 (L) 11/15/2022 1028   HCT 36.6 11/15/2022 1028   PLT 386.0 11/15/2022 1028   MCV 77.3 (L) 11/15/2022 1028   MCH 26.2 05/24/2014 0620   MCHC 32.0 11/15/2022 1028   RDW 14.9 11/15/2022 1028   LYMPHSABS 2.4 11/15/2022 1028   MONOABS 0.6 11/15/2022 1028   EOSABS 0.3 11/15/2022 1028   BASOSABS 0.1 11/15/2022 1028    No results found for: "POCLITH", "LITHIUM"   No results found for: "PHENYTOIN", "PHENOBARB", "VALPROATE", "CBMZ"   .res Assessment: Plan:    Kalman Shan  was seen today for follow-up, anxiety and depression.  Diagnoses and all orders for this visit:  Panic disorder  with agoraphobia -     ALPRAZolam (XANAX) 1 MG tablet; TAKE 1/2 (ONE-HALF) TABLET BY MOUTH THREE TIMES DAILY AND 3/4 (THREE-FOURTHS) TABLET AT BEDTIME -     Vilazodone HCl 20 MG TABS; Take 1 tablet (20 mg total) by mouth daily.  PTSD (post-traumatic stress disorder) -     ALPRAZolam (XANAX) 1 MG tablet; TAKE 1/2 (ONE-HALF) TABLET BY MOUTH THREE TIMES DAILY AND 3/4 (THREE-FOURTHS) TABLET AT BEDTIME -     Vilazodone HCl 20 MG TABS; Take 1 tablet (20 mg total) by mouth daily.  Major depressive disorder, recurrent episode, moderate (HCC) -     Vilazodone HCl 20 MG TABS; Take 1 tablet (20 mg total) by mouth daily.  Insomnia due to mental condition  Delayed sleep phase syndrome  Greater than 50% of 30 min face to face time with patient was spent on counseling and coordination of care.   Less anxious and depressed with viibryd without tremors she had with SSRIs.   Chronic avoidance and general fearfulness at baseline. Chronically easily stressed.  Dep managed.  Recent panic DT medical problem but isolated.  Don't expect more at this time.  Chronically erratic sleep schedule and resistant to CBT for sleep bc never seems to understand the concepts.  Spending too much time in bed but she thinks it is necessary.  Supportive therapy around dealing with recent family conflict.  Problems solving on how to deal with this issue from an estranged son and daughter participating in it..    Does not think she can tolerate anxiety without Xanax.  A lot of benefit and no SE.  We discussed the short-term risks associated with benzodiazepines including sedation and increased fall risk among others.  Discussed long-term side effect risk including dependence, potential withdrawal symptoms, and the potential eventual dose-related risk of dementia.  But recent studies from 2020 dispute this association between benzodiazepines and dementia risk. Newer studies in 2020 do not support an association with dementia.   Satisfied with Xanax now..  Already at significant dose for age.  She has kept benefit.  Tolerated well.   Continue alprazolam 1 mg 1/2 tab TID and 3/4 tab HS  Don't change meds on her own..  Tends to want to reduce or stop SSRIs repeatedly and always gets worse.  Been more consistent Viibryd 20 mg daily helped anxiety and irritability and depression. Likely to need something LT DT age and LT sx.  Insight is lacking on this subject.  Encourage consistent  No med change indicated.  FU 6 mos  Lynder Parents, MD, DFAPA   Please see After Visit Summary for patient specific instructions.  Future Appointments  Date Time Provider [place]  02/14/2023 10:00 AM Tawnya Crook, MD [facility]  03/08/2023  1:30 PM Loel Dubonnet, NP [facility]    No orders of the defined types were placed in this encounter.     -------------------------------

## 2023-01-26 DIAGNOSIS — E1165 Type 2 diabetes mellitus with hyperglycemia: Secondary | ICD-10-CM | POA: Diagnosis not present

## 2023-02-14 ENCOUNTER — Ambulatory Visit (INDEPENDENT_AMBULATORY_CARE_PROVIDER_SITE_OTHER): Payer: 59 | Admitting: Family Medicine

## 2023-02-14 ENCOUNTER — Encounter: Payer: Self-pay | Admitting: Family Medicine

## 2023-02-14 VITALS — BP 122/60 | HR 96 | Temp 97.3°F | Ht 63.0 in | Wt 164.2 lb

## 2023-02-14 DIAGNOSIS — E1159 Type 2 diabetes mellitus with other circulatory complications: Secondary | ICD-10-CM | POA: Diagnosis not present

## 2023-02-14 DIAGNOSIS — E785 Hyperlipidemia, unspecified: Secondary | ICD-10-CM | POA: Diagnosis not present

## 2023-02-14 DIAGNOSIS — I152 Hypertension secondary to endocrine disorders: Secondary | ICD-10-CM | POA: Diagnosis not present

## 2023-02-14 DIAGNOSIS — R052 Subacute cough: Secondary | ICD-10-CM | POA: Diagnosis not present

## 2023-02-14 DIAGNOSIS — E1169 Type 2 diabetes mellitus with other specified complication: Secondary | ICD-10-CM

## 2023-02-14 DIAGNOSIS — R600 Localized edema: Secondary | ICD-10-CM

## 2023-02-14 DIAGNOSIS — Z794 Long term (current) use of insulin: Secondary | ICD-10-CM | POA: Diagnosis not present

## 2023-02-14 DIAGNOSIS — E039 Hypothyroidism, unspecified: Secondary | ICD-10-CM

## 2023-02-14 DIAGNOSIS — E119 Type 2 diabetes mellitus without complications: Secondary | ICD-10-CM

## 2023-02-14 LAB — COMPREHENSIVE METABOLIC PANEL
ALT: 20 U/L (ref 0–35)
AST: 23 U/L (ref 0–37)
Albumin: 4.3 g/dL (ref 3.5–5.2)
Alkaline Phosphatase: 87 U/L (ref 39–117)
BUN: 13 mg/dL (ref 6–23)
CO2: 29 mEq/L (ref 19–32)
Calcium: 10 mg/dL (ref 8.4–10.5)
Chloride: 102 mEq/L (ref 96–112)
Creatinine, Ser: 0.85 mg/dL (ref 0.40–1.20)
GFR: 66.09 mL/min (ref >60.00)
Glucose, Bld: 146 mg/dL — ABNORMAL HIGH (ref 70–99)
Potassium: 4 mEq/L (ref 3.5–5.1)
Sodium: 140 mEq/L (ref 135–145)
Total Bilirubin: 0.4 mg/dL (ref 0.2–1.2)
Total Protein: 7 g/dL (ref 6.0–8.3)

## 2023-02-14 LAB — MICROALBUMIN / CREATININE URINE RATIO
Creatinine,U: 138.2 mg/dL
Microalb Creat Ratio: 1.8 mg/g (ref 0.0–30.0)
Microalb, Ur: 2.5 mg/dL — ABNORMAL HIGH (ref 0.0–1.9)

## 2023-02-14 LAB — HEMOGLOBIN A1C: Hgb A1c MFr Bld: 8.3 % — ABNORMAL HIGH (ref 4.6–6.5)

## 2023-02-14 MED ORDER — FUROSEMIDE 20 MG PO TABS: 20.0000 mg | ORAL_TABLET | Freq: Every day | ORAL | 1 refills | Status: DC

## 2023-02-14 NOTE — Patient Instructions (Addendum)
It was very nice to see you today!  Give the cough more time.  If things change/not improve, let us know.   Furosemide every other day   PLEASE NOTE:  If you had any lab tests please let us know if you have not heard back within a few days. You may see your results on MyChart before we have a chance to review them but we will give you a call once they are reviewed by [place]. If we ordered any referrals today, please let us know if you have not heard from their office within the next week.   Please try these tips to maintain a healthy lifestyle:  Eat most of your calories during the day when you are active. Eliminate processed foods including packaged sweets (pies, cakes, cookies), reduce intake of potatoes, white bread, white pasta, and white rice. Look for whole grain options, oat flour or almond flour.  Each meal should contain half fruits/vegetables, one quarter protein, and one quarter carbs (no bigger than a computer mouse).  Cut down on sweet beverages. This includes juice, soda, and sweet tea. Also watch fruit intake, though this is a healthier sweet option, it still contains natural sugar! Limit to 3 servings daily.  Drink at least 1 glass of water with each meal and aim for at least 8 glasses per day  Exercise at least 150 minutes every week.

## 2023-02-14 NOTE — Progress Notes (Signed)
Subjective:     Patient ID: Teresa Rice, female    DOB: January 25, 1946, 77 y.o.   MRN: [id number]  Chief Complaint  Patient presents with   Follow-up    3 month follow-up for dm, htn Coffee, no food   Headache    Sinus headache     HPI  HTN-Pt is on cartia 240, valsartan 160, metoprolol 25.  Bp's running 130/69.  No dizziness/cp/palp/sob.  Palpitations better.  [employer] + at [employer] to [employer].  DM type 2-metformin 1000mg  bid, ozempic 2mg  weekly, lantus 32u.  Sugars running - can be over 200 in evening.  Junk food at times.  Sinus HA-since this am only.  Has been dealing w/URI since 2/10.   HLD-fenofibrate 160 and crestor 10mg  Hypothyroidism-on 0.025mg . cough - since about 2/10-congested from back of throat and runny nose   was on abx, albuteron, pred and inhalers.  Bringing up green.  No sob.  Daughter still coughing as well. No f/c. Feels ok, just cough Edema-lasix daily. Works.  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   Mild intermittent asthma    OA (osteoarthritis)    fingers    Palpitations 09/04/2022   Trigger finger of both hands    wear slints and special gloves at night   Type 2 diabetes mellitus [facility])     Past Surgical History:  Procedure Laterality Date   BLADDER SURGERY  2012   in [place]   CYSTOSCOPY/RETROGRADE/URETEROSCOPY Left 05/24/2014   Procedure: CYSTOSCOPY/RETROGRADE/ URETEROSCOPY;  Surgeon: Jorja Loa, MD;  Location: [facility];  Service: Urology;   Laterality: Left;   TRANSURETHRAL RESECTION OF BLADDER TUMOR N/A 05/24/2014   Procedure: TRANSURETHRAL RESECTION OF BLADDER TUMOR (TURBT) ;  Surgeon: Jorja Loa, MD;  Location: [facility];  Service: Urology;  Laterality: N/A;   TRANSURETHRAL RESECTION OF BLADDER TUMOR N/A 08/04/2015   Procedure: TRANSURETHRAL RESECTION OF BLADDER TUMOR (TURBT);  Surgeon: Franchot Gallo, MD;  Location: [facility];  Service: Urology;  Laterality: N/A;    Outpatient Medications Prior to Visit  Medication Sig Dispense Refill   Accu-Chek Softclix Lancets lancets      albuterol (VENTOLIN HFA) 108 (90 Base) MCG/ACT inhaler INHALE 2 PUFFS BY MOUTH EVERY 6 HOURS AS NEEDED FOR WHEEZING OR SHORTNESS OF BREATH 9 g 1   ALPRAZolam (XANAX) 1 MG tablet TAKE 1/2 (ONE-HALF) TABLET BY MOUTH THREE TIMES DAILY AND 3/4 (THREE-FOURTHS) TABLET AT BEDTIME 71 tablet 5   azelastine (ASTELIN) 0.1 % nasal spray Place 1 spray into both nostrils 2 (two) times daily. Use in each nostril as directed 90 mL 3   B-D UF III MINI PEN NEEDLES 31G X 5 MM MISC SMARTSIG:1 Pen Needle SUB-Q Daily     baclofen (LIORESAL) 10 MG tablet Take 0.5 tablets (5 mg total) by mouth 2 (two) times daily as needed for muscle spasms. 30 each 0   blood glucose meter kit and supplies  Dispense based on patient and insurance preference. Use up to four times daily as directed. (FOR ICD-10 E10.9, E11.9). 1 each 0   Calcium Carbonate (CALCIUM 600 PO) Take 1 tablet by mouth daily in the afternoon.     cholecalciferol (VITAMIN D3) 25 MCG (1000 UNIT) tablet Take 1,000 Units by mouth daily.     diltiazem (CARTIA XT) 240 MG 24 hr capsule Take 1 capsule (240 mg total) by mouth daily. 90 capsule 1   fenofibrate 160 MG tablet Take 1 tablet by mouth once daily 90 tablet 3   glucose blood test strip Use as instructed 100 each 12   LANTUS SOLOSTAR 100 UNIT/ML Solostar Pen INJECT 15 UNITS SUBCUTANEOUSLY ONCE DAILY AT NIGHT 15 mL 0   levothyroxine (SYNTHROID) 25 MCG  tablet Take one pill daily for thyroid 90 tablet 3   magnesium oxide (MAG-OX) 400 MG tablet Take 400 mg by mouth 2 (two) times daily.     metFORMIN (GLUCOPHAGE) 1000 MG tablet Take 1 tablet (1,000 mg total) by mouth 2 (two) times daily with a meal. 180 tablet 3   metoprolol succinate (TOPROL XL) 25 MG 24 hr tablet Take 1 tablet (25 mg total) by mouth daily. 90 tablet 3   Multiple Vitamin (MULTIVITAMIN WITH MINERALS) TABS tablet Take 1 tablet by mouth daily.     nystatin cream (MYCOSTATIN) Apply topically.     Omega 3 1000 MG CAPS Take 1,000 mg by mouth daily.     omeprazole (PRILOSEC) 20 MG capsule Take 1 capsule (20 mg total) by mouth daily. 90 capsule 3   Polyethylene Glycol 3350 (MIRALAX PO) Take by mouth every evening.     rosuvastatin (CRESTOR) 10 MG tablet Take 1 tablet by mouth once daily 90 tablet 1   Semaglutide, 2 MG/DOSE, (OZEMPIC, 2 MG/DOSE,) 8 MG/3ML SOPN Inject 2 mg into the skin once a week. 9 mL 3   SYMBICORT 160-4.5 MCG/ACT inhaler Inhale 2 puffs into the lungs 2 (two) times daily. 3 each 3   traMADol (ULTRAM) 50 MG tablet Take 1 tablet by mouth every 6 (six) hours as needed.     triamcinolone cream (KENALOG) 0.1 % Apply topically 3 (three) times daily.     valsartan (DIOVAN) 160 MG tablet Take 1 tablet (160 mg total) by mouth daily. 90 tablet 1   Vilazodone HCl 20 MG TABS Take 1 tablet (20 mg total) by mouth daily. 90 tablet 1   VITAMIN E PO Take by mouth.     furosemide (LASIX) 20 MG tablet Take 1 tablet (20 mg total) by mouth daily as needed. 30 tablet 3   losartan (COZAAR) 100 MG tablet Take 100 mg by mouth daily.     benzonatate (TESSALON PERLES) 100 MG capsule Take 1 capsule (100 mg total) by mouth 3 (three) times daily as needed for cough. (Patient not taking: Reported on 02/14/2023) 20 capsule 0   cefdinir (OMNICEF) 300 MG capsule Take 1 capsule (300 mg total) by mouth 2 (two) times daily. 14 capsule 0   No facility-administered medications prior to visit.     Allergies  Allergen Reactions   Celebrex [Celecoxib] Anaphylaxis   Glipizide Itching   Penicillins Other (See Comments)    "Burn marks from inside out"    Sulfa Antibiotics Itching   ROS neg/noncontributory except as noted HPI/below Muscle spasms in neck      Objective:     BP 122/60 (BP Location: Left Arm, Patient Position: Sitting, Cuff Size: Normal)   Pulse  96   Temp (!) 97.3 F (36.3 C) (Temporal)   Ht 5\' 3"  (1.6 m)   Wt 164 lb 3 oz (74.5 kg)   LMP  (LMP Unknown)   SpO2 99%   BMI 29.08 kg/m  Wt Readings from Last 3 Encounters:  02/14/23 164 lb 3 oz (74.5 kg)  01/09/23 163 lb 2 oz (74 kg)  01/07/23 167 lb 1.6 oz (75.8 kg)    Physical Exam   Gen: WDWN NAD HEENT: NCAT, conjunctiva not injected, sclera nonicteric NECK:  supple, no thyromegaly, no nodes, no carotid bruits CARDIAC:tachy RRR.  Occ ectopic, S1S2+, no murmur. DP 2+B LUNGS: CTAB. No wheezes.  Occ cough(more congested) ABDOMEN:  BS+, soft, NTND, No HSM, no masses EXT:  no edema MSK: no gross abnormalities.  NEURO: A&O x3.  CN II-XII intact.  PSYCH: normal mood. Good eye contact     Assessment & Plan:   Problem List Items Addressed This Visit       Cardiovascular and Mediastinum   Hypertension associated with diabetes ([place])   Relevant Medications   furosemide (LASIX) 20 MG tablet     Endocrine   Acquired hypothyroidism   Type 2 diabetes mellitus with insulin therapy ([place]) - Primary   Relevant Orders   Microalbumin / creatinine urine ratio   Comprehensive metabolic panel   Hemoglobin A1c   Hyperlipidemia associated with type 2 diabetes mellitus (HCC)   Relevant Medications   furosemide (LASIX) 20 MG tablet     Other   Localized edema   Other Visit Diagnoses     Subacute cough          DM type 2.  Chronic.  Controlled.  Cont lantus 32 units, metformin 1000mg  bid, ozempic 2mg .  Work on diet/exercise.  Check A1c, urine microalb/creat ratio, cmp.   HTN-chronic.  Well controlled.    Cont valsartan 160, cartia 240mg  daily, metoprolol 25mg  daily HLD-mixed.  Chronic.  Controlled. Cont fenofibrate 160 and crestor 20mg  Hpothyroidism-chronic.  Controlled.  Cont synthroid 0.025mg  Cough-s/p URI-improving.  Family still coughing as well.  Advised post-infectious cough can last 3 mo.  Cont symbicort, albuterol if needed.  Don't think she needs abx.  Monitor for now.  Any change, let us know Chronic L>R leg edema.  Taking lasix 20mg  daily now.  Advised to try qod and see if that holds.  Check bmp.  F/u 3 mo  Meds ordered this encounter  Medications   furosemide (LASIX) 20 MG tablet    Sig: Take 1 tablet (20 mg total) by mouth daily.    Dispense:  90 tablet    Refill:  1    Wellington Hampshire, MD

## 2023-02-14 NOTE — Progress Notes (Signed)
Sugars out of control-increase lantus to 35 units.  Does she want to change ozempic to mounjaro to see if that works better?Marland Kitchen  Of course, work on diet.  Let us know Monday how sugars are doing.  We can also add jardiance 10mg  tabs daily if wants

## 2023-02-15 ENCOUNTER — Other Ambulatory Visit: Payer: Self-pay | Admitting: Family Medicine

## 2023-02-15 DIAGNOSIS — E119 Type 2 diabetes mellitus without complications: Secondary | ICD-10-CM

## 2023-02-15 MED ORDER — TIRZEPATIDE 7.5 MG/0.5ML ~~LOC~~ SOAJ: 7.5000 mg | SUBCUTANEOUS | 3 refills | Status: DC

## 2023-02-26 DIAGNOSIS — E1165 Type 2 diabetes mellitus with hyperglycemia: Secondary | ICD-10-CM | POA: Diagnosis not present

## 2023-03-08 ENCOUNTER — Ambulatory Visit (HOSPITAL_BASED_OUTPATIENT_CLINIC_OR_DEPARTMENT_OTHER): Payer: 59 | Admitting: Family

## 2023-03-11 ENCOUNTER — Encounter (HOSPITAL_BASED_OUTPATIENT_CLINIC_OR_DEPARTMENT_OTHER): Payer: Self-pay | Admitting: Family

## 2023-03-11 ENCOUNTER — Ambulatory Visit (INDEPENDENT_AMBULATORY_CARE_PROVIDER_SITE_OTHER): Payer: 59 | Admitting: Family

## 2023-03-11 VITALS — BP 124/64 | HR 95 | Ht 63.0 in | Wt 167.4 lb

## 2023-03-11 DIAGNOSIS — R0683 Snoring: Secondary | ICD-10-CM

## 2023-03-11 DIAGNOSIS — Z794 Long term (current) use of insulin: Secondary | ICD-10-CM

## 2023-03-11 DIAGNOSIS — I10 Essential (primary) hypertension: Secondary | ICD-10-CM | POA: Diagnosis not present

## 2023-03-11 DIAGNOSIS — E1165 Type 2 diabetes mellitus with hyperglycemia: Secondary | ICD-10-CM

## 2023-03-11 DIAGNOSIS — I4711 Inappropriate sinus tachycardia, so stated: Secondary | ICD-10-CM

## 2023-03-11 DIAGNOSIS — R600 Localized edema: Secondary | ICD-10-CM

## 2023-03-11 DIAGNOSIS — E782 Mixed hyperlipidemia: Secondary | ICD-10-CM | POA: Diagnosis not present

## 2023-03-11 DIAGNOSIS — R002 Palpitations: Secondary | ICD-10-CM | POA: Diagnosis not present

## 2023-03-11 MED ORDER — VALSARTAN 160 MG PO TABS: 160.0000 mg | ORAL_TABLET | Freq: Every day | ORAL | 3 refills | Status: DC

## 2023-03-11 MED ORDER — METOPROLOL SUCCINATE ER 50 MG PO TB24: 50.0000 mg | ORAL_TABLET | Freq: Every day | ORAL | 3 refills | Status: DC

## 2023-03-11 NOTE — Patient Instructions (Addendum)
Medication Instructions:  Your physician has recommended you make the following change in your medication:   INCREASE Metoprolol Succinate to 50mg  daily You may use up your 25mg  tablets by taking two of them together  CHANGE Furosemide (Lasix) to one tablet daily  *If you need a refill on your cardiac medications before your next appointment, please call your pharmacy*  Follow-Up: At [facility], you and your health needs are our priority.  As part of our continuing mission to provide you with exceptional heart care, we have created designated Provider Care Teams.  These Care Teams include your primary Cardiologist (physician) and Advanced Practice Providers (APPs -  Physician Assistants and Nurse Practitioners) who all work together to provide you with the care you need, when you need it.  We recommend signing up for the patient portal called "MyChart".  Sign up information is provided on this After Visit Summary.  MyChart is used to connect with patients for Virtual Visits (Telemedicine).  Patients are able to view lab/test results, encounter notes, upcoming appointments, etc.  Non-urgent messages can be sent to your provider as well.   To learn more about what you can do with MyChart, go to [email or web address].    Your next appointment:   2-3 month(s)  Provider:   Chilton Si, MD or Gillian Shields, NP   Other Instructions  Tips to Measure your Blood Pressure Correctly  Recommend checking blood pressure 3 times per week. If you blood pressure is consistently more than 130 or less than 110 at home, please let us know.  Here's what you can do to ensure a correct reading:  Don't drink a caffeinated beverage or smoke during the 30 minutes before the test.  Sit quietly for five minutes before the test begins.  During the measurement, sit in a chair with your feet on the floor and your arm supported so your elbow is at about heart level.  The inflatable part of the  cuff should completely cover at least 80% of your upper arm, and the cuff should be placed on bare skin, not over a shirt.  Don't talk during the measurement.  Have your blood pressure measured twice, with a brief break in between. If the readings are different by 5 points or more, have it done a third time.  To prevent palpitations: Make sure you are adequately hydrated.  Avoid and/or limit caffeine containing beverages like soda or tea. Exercise regularly.  Manage stress well. Some over the counter medications can cause palpitations such as Benadryl, AdvilPM, TylenolPM. Regular Advil or Tylenol do not cause palpitations.     To prevent or reduce lower extremity swelling: Eat a low salt diet. Salt makes the body hold onto extra fluid which causes swelling. Sit with legs elevated. For example, in the recliner or on an ottoman.  Wear knee-high compression stockings during the daytime. Ones labeled 15-20 mmHg provide good compression.

## 2023-03-11 NOTE — Progress Notes (Signed)
Office Visit    Patient Name: Teresa Rice Date of Encounter: 03/11/2023  PCP:  Jeani Sow, MD   [facility]  Cardiologist:  Chilton Si, MD  Advanced Practice Provider:  No care team member to display Electrophysiologist:  None      Chief Complaint    Teresa Rice is a 77 y.o. female presents today for tachycardia follow up.   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   Mild intermittent asthma    OA (osteoarthritis)    fingers    Palpitations 09/04/2022   Trigger finger of both hands    wear slints and special gloves at night   Type 2 diabetes mellitus    Past Surgical History:  Procedure Laterality Date   BLADDER SURGERY  2012   in [place]   TURBT   CHOLECYSTECTOMY  1970   CYSTOSCOPY/RETROGRADE/URETEROSCOPY Left 05/24/2014   Procedure: CYSTOSCOPY/RETROGRADE/ URETEROSCOPY;  Surgeon: Chelsea Aus, MD;  Location: [facility];  Service: Urology;  Laterality: Left;   TRANSURETHRAL RESECTION OF BLADDER TUMOR N/A 05/24/2014   Procedure: TRANSURETHRAL RESECTION OF BLADDER TUMOR (TURBT) ;  Surgeon: Chelsea Aus, MD;  Location: [facility];  Service: Urology;  Laterality: N/A;   TRANSURETHRAL RESECTION OF BLADDER TUMOR N/A 08/04/2015   Procedure: TRANSURETHRAL RESECTION OF BLADDER TUMOR (TURBT);  Surgeon: Marcine Matar, MD;  [facility];  Service: Urology;  Laterality: N/A;    Allergies  Allergies  Allergen Reactions   Celebrex [Celecoxib] Anaphylaxis   Glipizide Itching   Penicillins Other (See Comments)    "Burn marks from inside out"    Sulfa Antibiotics Itching     History of Present Illness    Teresa Rice is a 77 y.o. female with a hx of tachycardia, HTN, anxiety, Dm2 last seen 01/07/2023 by Dr. Earle Gell PCP 06/2022 noting palpitations with EKG revealing PACs.  At follow-up later that month metoprolol was increased to 75 mg and she was referred to cardiology.  Established with Dr. Duke Salvia 09/04/2022.Metoprolol was stopped and she was started on diltiazem 240 mg daily.  7-day ZIO ordered with preliminary report with NSR and rare PVC/PAC <1% burden with no significant arrhythmia.Echo 09/19/22 with normal LVEF 55-60%, no RWMA, mild asymmetric LVH  Episode 10/17/2023 losartan was stopped and transition to valsartan 160 mg daily for improved blood pressure control.  Home sleep study was ordered but not performed.  She last saw Dr. Duke Salvia 01/07/2023.  Her blood pressure was well-controlled on diltiazem and valsartan.  Metoprolol succinate 25 mg daily added for improved heart rate control.  Presents today for follow up independently. Pleasant lady who is excited to welcome her third great grandchild in September. She monitors her heart rate on her Fitbit. Reports consistent intermittent episodes of tachycardia or palpitations. Notes they happen most often when she is rushing. Does not feel the Toprol at 25mg  dose is making much of an impact.  Reports BP at home checked intermittently often in the 120s.  Reports no shortness of breath nor dyspnea on exertion. Reports no chest pain, pressure, or tightness. No edema,  orthopnea, PND.  No lightheadedness, dizziness.  EKGs/Labs/Other Studies Reviewed:   The following studies were reviewed today:  Echo 09/19/22  1. Left ventricular ejection fraction, by estimation, is 55 to 60%. The  left ventricle has normal function. The left ventricle has no regional  wall motion abnormalities. There is mild asymmetric left ventricular  hypertrophy of the basal-septal segment.  Left ventricular diastolic  parameters are consistent with Grade I  diastolic dysfunction (impaired relaxation).   2. Right ventricular systolic function is normal. The right ventricular  size is normal. Tricuspid regurgitation signal is inadequate for assessing  PA pressure.   3. The mitral valve is normal in structure. Mild mitral valve  regurgitation.   4. The aortic valve is tricuspid. There is mild calcification of the  aortic valve. There is mild thickening of the aortic valve. Aortic valve  regurgitation is not visualized. Aortic valve sclerosis/calcification is  present, without any evidence of  aortic stenosis.   5. The inferior vena cava is normal in size with greater than 50%  respiratory variability, suggesting right atrial pressure of 3 mmHg.   Comparison(s): No prior Echocardiogram.  EKG:  EKG is not ordered today.    Recent Labs: 07/12/2022: Magnesium 1.4 11/15/2022: Hemoglobin 11.7; Platelets 386.0; TSH 4.69 02/14/2023: ALT 20; BUN 13; Creatinine, Ser 0.85; Potassium 4.0; Sodium 140  Recent Lipid Panel    Component Value Date/Time   CHOL 127 11/15/2022 1028   TRIG 182.0 (H) 11/15/2022 1028   HDL 33.00 (L) 11/15/2022 1028   CHOLHDL 4 11/15/2022 1028   VLDL 36.4 11/15/2022 1028   LDLCALC 57 11/15/2022 1028   LDLDIRECT 75.0 07/12/2022 1029    Home Medications   Current Meds  Medication Sig   Accu-Chek Softclix Lancets lancets    albuterol (VENTOLIN HFA) 108 (90 Base) MCG/ACT inhaler INHALE 2 PUFFS BY MOUTH EVERY 6 HOURS AS NEEDED FOR WHEEZING OR SHORTNESS OF BREATH   ALPRAZolam (XANAX) 1 MG tablet TAKE 1/2 (ONE-HALF) TABLET BY MOUTH THREE TIMES DAILY AND 3/4 (THREE-FOURTHS) TABLET AT BEDTIME   azelastine (ASTELIN) 0.1 % nasal spray Place 1 spray into both nostrils 2 (two) times daily. Use in each nostril as directed   B-D UF III MINI PEN NEEDLES 31G X 5 MM MISC SMARTSIG:1 Pen Needle SUB-Q Daily   baclofen (LIORESAL) 10 MG tablet Take 0.5 tablets (5 mg total) by mouth 2 (two) times daily as  needed for muscle spasms.   blood glucose meter kit and supplies Dispense based on patient and insurance preference. Use up to four times daily as directed. (FOR ICD-10 E10.9, E11.9).   Calcium Carbonate (CALCIUM 600 PO) Take 1 tablet by mouth daily in the afternoon.   cholecalciferol (VITAMIN D3) 25 MCG (1000 UNIT) tablet Take 1,000 Units by mouth daily.   diltiazem (CARTIA XT) 240 MG 24 hr capsule Take 1 capsule (240 mg total) by mouth daily.   fenofibrate 160 MG tablet Take 1 tablet by mouth once daily   furosemide (LASIX) 20 MG tablet Take 1 tablet (20 mg total) by mouth daily.   glucose blood test strip Use as instructed   LANTUS SOLOSTAR 100 UNIT/ML Solostar Pen INJECT 15 UNITS SUBCUTANEOUSLY ONCE DAILY AT NIGHT   levothyroxine (SYNTHROID) 25 MCG tablet Take one pill daily for thyroid   magnesium oxide (MAG-OX) 400 MG tablet Take 400 mg by mouth 2 (two) times daily.   metFORMIN (GLUCOPHAGE) 1000 MG tablet Take 1 tablet (1,000 mg total) by mouth 2 (two) times daily with  a meal.   metoprolol succinate (TOPROL XL) 25 MG 24 hr tablet Take 1 tablet (25 mg total) by mouth daily.   Multiple Vitamin (MULTIVITAMIN WITH MINERALS) TABS tablet Take 1 tablet by mouth daily.   nystatin cream (MYCOSTATIN) Apply topically.   Omega 3 1000 MG CAPS Take 1,000 mg by mouth daily.   omeprazole (PRILOSEC) 20 MG capsule Take 1 capsule (20 mg total) by mouth daily.   Polyethylene Glycol 3350 (MIRALAX PO) Take by mouth every evening.   rosuvastatin (CRESTOR) 10 MG tablet Take 1 tablet by mouth once daily   SYMBICORT 160-4.5 MCG/ACT inhaler Inhale 2 puffs into the lungs 2 (two) times daily.   tirzepatide (MOUNJARO) 7.5 MG/0.5ML Pen Inject 7.5 mg into the skin once a week.   traMADol (ULTRAM) 50 MG tablet Take 1 tablet by mouth every 6 (six) hours as needed.   triamcinolone cream (KENALOG) 0.1 % Apply topically 3 (three) times daily.   valsartan (DIOVAN) 160 MG tablet Take 1 tablet (160 mg total) by mouth daily.    Vilazodone HCl 20 MG TABS Take 1 tablet (20 mg total) by mouth daily.   VITAMIN E PO Take by mouth.    Review of Systems      All other systems reviewed and are otherwise negative except as noted above.  Physical Exam    VS:  BP 124/64   Pulse 95   Ht  (1.6 m)   Wt 167 lb 6.4 oz (75.9 kg)   LMP  (LMP Unknown)   BMI 29.65 kg/m  , BMI Body mass index is 29.65 kg/m.  Wt Readings from Last 3 Encounters:  03/11/23 167 lb 6.4 oz (75.9 kg)  02/14/23 164 lb 3 oz (74.5 kg)  01/09/23 163 lb 2 oz (74 kg)    GEN: Well nourished, overweight, well developed, in no acute distress. HEENT: normal. Neck: Supple, no JVD, carotid bruits, or masses. Cardiac: RRR, no murmurs, rubs, or gallops. No clubbing, cyanosis, edema.  Radials/PT 2+ and equal bilaterally.  Respiratory:  Respirations regular and unlabored, clear to auscultation bilaterally. GI: Soft, nontender, nondistended. MS: No deformity or atrophy. Skin: Warm and dry, no rash. Neuro:  Strength and sensation are intact. Psych: Normal affect.  Assessment & Plan    HTN -  BP at goal of <130/80. Continue Diltiazem , Valsartan .  Increasing metoprolol succinate to 50 mg as below for palpitations.  She will continue to monitor BP at home and if she develops hypotension can reduce dose of valsartan.  Discussed to monitor BP at home at least 2 hours after medications and sitting for 5-10 minutes.   LE edema - Notes L>R edema. No appreciable edema on exam.  Likely etiology venous insufficiency as echo 08/2022 normal LVEF.  Has been taking Lasix every other day - encouraged to take daily. Movement does help with her swelling. Encouraged to continue wearing her compression stockings.   Inappropriate sinus tachycardia / Palpitations - Still bothered by palpitations. Increase Metoprolol succinate to  daily.  Snores - STOP Bang 5. Sleep study previously ordered. She notes she is sleeping better and politely defers sleep study.    DM2 - 01/2023 A1c 8.3. Continue to follow with PCP.   HLD - Continue Rosuvastatin  daily.         Disposition: Follow up  in 2-3 months  with Chilton Si, MD or APP.  Signed, Alver Sorrow, NP 03/11/2023, 8:26 AM [facility]

## 2023-03-13 ENCOUNTER — Telehealth: Payer: Self-pay | Admitting: Family Medicine

## 2023-03-13 ENCOUNTER — Other Ambulatory Visit: Payer: Self-pay | Admitting: *Deleted

## 2023-03-13 NOTE — Telephone Encounter (Signed)
Pt states she needs an RX for sensors and the pharmacy sent RX to [place] but I do not see it. Please advise.

## 2023-03-13 NOTE — Telephone Encounter (Signed)
Not on med list, please advise 

## 2023-03-14 ENCOUNTER — Other Ambulatory Visit: Payer: Self-pay | Admitting: Family Medicine

## 2023-03-14 ENCOUNTER — Other Ambulatory Visit: Payer: Self-pay | Admitting: *Deleted

## 2023-03-14 MED ORDER — FREESTYLE LIBRE 2 SENSOR MISC: 3 refills | Status: DC

## 2023-03-14 MED ORDER — FREESTYLE LIBRE 2 SENSOR MISC: 1 refills | Status: DC

## 2023-03-14 NOTE — Telephone Encounter (Signed)
Tried calling patient twice, message saying call cannot be completed come on. Will try again at a later time.

## 2023-03-14 NOTE — Telephone Encounter (Signed)
Patient stated that it is the [facility] and she believe she got it back when Dr. Mardelle Matte was her pcp.

## 2023-03-17 ENCOUNTER — Other Ambulatory Visit: Payer: Self-pay | Admitting: Family Medicine

## 2023-03-19 ENCOUNTER — Other Ambulatory Visit: Payer: Self-pay | Admitting: Family Medicine

## 2023-03-19 DIAGNOSIS — K219 Gastro-esophageal reflux disease without esophagitis: Secondary | ICD-10-CM

## 2023-03-21 DIAGNOSIS — H903 Sensorineural hearing loss, bilateral: Secondary | ICD-10-CM | POA: Diagnosis not present

## 2023-04-19 ENCOUNTER — Ambulatory Visit (INDEPENDENT_AMBULATORY_CARE_PROVIDER_SITE_OTHER): Payer: 59 | Admitting: Internal Medicine

## 2023-04-19 ENCOUNTER — Encounter: Payer: Self-pay | Admitting: Internal Medicine

## 2023-04-19 VITALS — BP 120/60 | HR 101 | Temp 98.2°F | Ht 63.0 in | Wt 170.0 lb

## 2023-04-19 DIAGNOSIS — J4541 Moderate persistent asthma with (acute) exacerbation: Secondary | ICD-10-CM

## 2023-04-19 LAB — POC COVID19 BINAXNOW: SARS Coronavirus 2 Ag: NEGATIVE

## 2023-04-19 MED ORDER — PREDNISONE 20 MG PO TABS
40.0000 mg | ORAL_TABLET | Freq: Every day | ORAL | 0 refills | Status: DC
Start: 2023-04-19 — End: 2023-04-30

## 2023-04-19 MED ORDER — PROMETHAZINE-DM 6.25-15 MG/5ML PO SYRP: 5.0000 mL | ORAL_SOLUTION | Freq: Four times a day (QID) | ORAL | 0 refills | Status: DC | PRN

## 2023-04-19 NOTE — Patient Instructions (Addendum)
We have sent in prednisone to take 2 pills daily for 5 days.   We have also sent in cough medicine promethazine/dm to use up to 4 times a day for the cough.

## 2023-04-19 NOTE — Progress Notes (Signed)
   Subjective:   Patient ID: Teresa Rice, female    DOB: [birth year], 77 y.o.   MRN: [id number]  HPI The patient is a 77 YO female coming in for 1 day cough/cold symptoms. Coughed all night. Has not taken symbicort today but typically takes BID.  Review of Systems  Constitutional:  Positive for activity change. Negative for appetite change, chills, fatigue, fever and unexpected weight change.  HENT:  Positive for congestion. Negative for ear discharge, ear pain, postnasal drip, rhinorrhea, sinus pressure, sinus pain, sneezing, sore throat, tinnitus, trouble swallowing and voice change.   Eyes: Negative.   Respiratory:  Positive for cough and shortness of breath. Negative for chest tightness and wheezing.   Cardiovascular: Negative.   Gastrointestinal: Negative.   Musculoskeletal:  Positive for myalgias.  Neurological: Negative.     Objective:  Physical Exam Constitutional:      Appearance: She is well-developed.  HENT:     Head: Normocephalic and atraumatic.     Comments: Oropharynx with redness and clear drainage, nose with swollen turbinates, TMs normal bilaterally.  Neck:     Thyroid: No thyromegaly.  Cardiovascular:     Rate and Rhythm: Normal rate and regular rhythm.  Pulmonary:     Effort: Pulmonary effort is normal. No respiratory distress.     Breath sounds: Normal breath sounds. No wheezing or rales.  Chest:     Chest wall: Tenderness present.  Abdominal:     Palpations: Abdomen is soft.  Musculoskeletal:        General: Tenderness present.     Cervical back: Normal range of motion.  Lymphadenopathy:     Cervical: No cervical adenopathy.  Skin:    General: Skin is warm and dry.  Neurological:     Mental Status: She is alert and oriented to person, place, and time.     Vitals:   04/19/23 1406  BP: 120/60  Pulse: (!) 101  Temp: 98.2 F (36.8 C)  TempSrc: Oral  SpO2: 95%  Weight: 170 lb (77.1 kg)  Height: 5\' 3"  (1.6 m)    Assessment & Plan:

## 2023-04-19 NOTE — Assessment & Plan Note (Signed)
With early flare today rx prednisone 5 day course and promethazine/dm cough syrup. Counseled to take her symbicort and use albuterol prn. If worsening or not improving let us know or return. Likely viral POC Covid-19 testing done today and negative no antibiotics indicated for 1 days of symptoms.

## 2023-04-27 ENCOUNTER — Other Ambulatory Visit: Payer: Self-pay | Admitting: Family Medicine

## 2023-04-30 ENCOUNTER — Ambulatory Visit (INDEPENDENT_AMBULATORY_CARE_PROVIDER_SITE_OTHER): Payer: 59 | Admitting: Family Medicine

## 2023-04-30 ENCOUNTER — Encounter: Payer: Self-pay | Admitting: Family Medicine

## 2023-04-30 VITALS — BP 110/74 | HR 105 | Temp 97.1°F | Resp 16 | Ht 63.0 in | Wt 167.0 lb

## 2023-04-30 DIAGNOSIS — K219 Gastro-esophageal reflux disease without esophagitis: Secondary | ICD-10-CM

## 2023-04-30 DIAGNOSIS — R052 Subacute cough: Secondary | ICD-10-CM | POA: Diagnosis not present

## 2023-04-30 MED ORDER — AZITHROMYCIN 250 MG PO TABS: ORAL_TABLET | ORAL | 0 refills | Status: AC

## 2023-04-30 MED ORDER — GUAIFENESIN-CODEINE 100-10 MG/5ML PO SYRP: 5.0000 mL | ORAL_SOLUTION | Freq: Every evening | ORAL | 0 refills | Status: DC | PRN

## 2023-04-30 MED ORDER — OMEPRAZOLE 20 MG PO CPDR: 20.0000 mg | DELAYED_RELEASE_CAPSULE | Freq: Two times a day (BID) | ORAL | 1 refills | Status: DC

## 2023-04-30 NOTE — Progress Notes (Signed)
Subjective:     Patient ID: Teresa Rice, female    DOB: October 04, 1946, 77 y.o.   MRN: [id number]  Chief Complaint  Patient presents with   Cough    Cough with dark green mucus that started months ago    HPI Cough-going for months.  Congested cough. Productive.  Wakes up a lot at night coughing.  seen 5/24 w/upper respiratory infection (URI) for 1 day.  Given prednisone for 5 days and cough syrup. Patient using Symbicort 160 twice daily and albuterol-occasional. Nothing helping.  Wheezing at times.  No shortness of breath.  No edema.  No palpation.  OTC (available over the counter without a prescription) cough medications not working.   Leaving town today.   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   Mild intermittent asthma    OA (osteoarthritis)    fingers    Palpitations 09/04/2022   Trigger finger of both hands    wear slints and special gloves at night   Type 2 diabetes mellitus [facility])     Past Surgical History:  Procedure Laterality Date   BLADDER SURGERY  2012   in [place]   TURBT   CHOLECYSTECTOMY  1970   CYSTOSCOPY/RETROGRADE/URETEROSCOPY Left 05/24/2014   Procedure: CYSTOSCOPY/RETROGRADE/ URETEROSCOPY;  Surgeon: Chelsea Aus, MD;  Location: [facility];  Service: Urology;  Laterality: Left;   TRANSURETHRAL RESECTION OF BLADDER TUMOR N/A 05/24/2014   Procedure: TRANSURETHRAL RESECTION OF BLADDER TUMOR (TURBT) ;  Surgeon: Chelsea Aus, MD;  Location: [facility];  Service: Urology;  Laterality: N/A;   TRANSURETHRAL RESECTION OF BLADDER  TUMOR N/A 08/04/2015   Procedure: TRANSURETHRAL RESECTION OF BLADDER TUMOR (TURBT);  Surgeon: Marcine Matar, MD;  Location: [facility];  Service: Urology;  Laterality: N/A;     Current Outpatient Medications:    Accu-Chek Softclix Lancets lancets, , Disp: , Rfl:    albuterol (VENTOLIN HFA) 108 (90 Base) MCG/ACT inhaler, INHALE 2 PUFFS BY MOUTH EVERY 6 HOURS AS NEEDED FOR WHEEZING OR SHORTNESS OF BREATH, Disp: 9 g, Rfl: 1   ALPRAZolam (XANAX) 1 MG tablet, TAKE 1/2 (ONE-HALF) TABLET BY MOUTH THREE TIMES DAILY AND 3/4 (THREE-FOURTHS) TABLET AT BEDTIME, Disp: 71 tablet, Rfl: 5   azelastine (ASTELIN) 0.1 % nasal spray, Place 1 spray into both nostrils 2 (two) times daily. Use in each nostril as directed, Disp: 90 mL, Rfl: 3   azithromycin (ZITHROMAX) 250 MG tablet, Take 2 tablets on day 1, then 1 tablet daily on days 2 through 5, Disp: 6 tablet, Rfl: 0   B-D UF III MINI PEN NEEDLES 31G X 5 MM MISC, SMARTSIG:1 Pen Needle SUB-Q Daily, Disp: , Rfl:    baclofen (LIORESAL) 10 MG tablet, Take 0.5 tablets (5 mg total) by mouth 2 (two) times daily as needed for muscle spasms., Disp: 30 each, Rfl: 0   blood glucose meter kit and supplies, Dispense based on patient and insurance preference. Use up to four times daily as directed. (FOR ICD-10 E10.9, E11.9)., Disp: 1 each,  Rfl: 0   Calcium Carbonate (CALCIUM 600 PO), Take 1 tablet by mouth daily in the afternoon., Disp: , Rfl:    cholecalciferol (VITAMIN D3) 25 MCG (1000 UNIT) tablet, Take 1,000 Units by mouth daily., Disp: , Rfl:    Continuous Glucose Sensor (FREESTYLE LIBRE 2 SENSOR) MISC, Use to test blood sugar daily, Disp: 1 each, Rfl: 1   Continuous Glucose Sensor (FREESTYLE LIBRE 2 SENSOR) MISC, Place once sensor every 14 days., Disp: 6 each, Rfl: 3   diltiazem (CARTIA XT) 240 MG 24 hr capsule, Take 1 capsule (240 mg total) by mouth daily., Disp: 90 capsule, Rfl: 1   fenofibrate 160 MG tablet, Take 1 tablet by mouth once daily, Disp: 90  tablet, Rfl: 0   furosemide (LASIX) 20 MG tablet, Take 1 tablet (20 mg total) by mouth daily., Disp: 90 tablet, Rfl: 1   glucose blood test strip, Use as instructed, Disp: 100 each, Rfl: 12   guaiFENesin-codeine (ROBITUSSIN AC) 100-10 MG/5ML syrup, Take 5 mLs by mouth at bedtime as needed for cough., Disp: 70 mL, Rfl: 0   LANTUS SOLOSTAR 100 UNIT/ML Solostar Pen, INJECT 15 UNITS SUBCUTANEOUSLY ONCE DAILY AT NIGHT, Disp: 15 mL, Rfl: 0   levothyroxine (SYNTHROID) 25 MCG tablet, Take one pill daily for thyroid, Disp: 90 tablet, Rfl: 3   magnesium oxide (MAG-OX) 400 MG tablet, Take 400 mg by mouth 2 (two) times daily., Disp: , Rfl:    metFORMIN (GLUCOPHAGE) 1000 MG tablet, TAKE 1 TABLET BY MOUTH TWICE DAILY WITH A MEAL, Disp: 180 tablet, Rfl: 0   metoprolol succinate (TOPROL XL) 50 MG 24 hr tablet, Take 1 tablet (50 mg total) by mouth daily., Disp: 90 tablet, Rfl: 3   Multiple Vitamin (MULTIVITAMIN WITH MINERALS) TABS tablet, Take 1 tablet by mouth daily., Disp: , Rfl:    nystatin cream (MYCOSTATIN), Apply topically., Disp: , Rfl:    Omega 3 1000 MG CAPS, Take 1,000 mg by mouth daily., Disp: , Rfl:    OZEMPIC, 2 MG/DOSE, 8 MG/3ML SOPN, Inject into the skin., Disp: , Rfl:    Polyethylene Glycol 3350 (MIRALAX PO), Take by mouth every evening., Disp: , Rfl:    rosuvastatin (CRESTOR) 10 MG tablet, Take 1 tablet by mouth once daily, Disp: 90 tablet, Rfl: 1   SYMBICORT 160-4.5 MCG/ACT inhaler, Inhale 2 puffs into the lungs 2 (two) times daily., Disp: 3 each, Rfl: 3   tirzepatide (MOUNJARO) 7.5 MG/0.5ML Pen, Inject 7.5 mg into the skin once a week., Disp: 6 mL, Rfl: 3   traMADol (ULTRAM) 50 MG tablet, Take 1 tablet by mouth every 6 (six) hours as needed., Disp: , Rfl:    triamcinolone cream (KENALOG) 0.1 %, Apply topically 3 (three) times daily., Disp: , Rfl:    valsartan (DIOVAN) 160 MG tablet, Take 1 tablet (160 mg total) by mouth daily., Disp: 90 tablet, Rfl: 3   Vilazodone HCl 20 MG TABS, Take 1 tablet  (20 mg total) by mouth daily., Disp: 90 tablet, Rfl: 1   VITAMIN E PO, Take by mouth., Disp: , Rfl:    omeprazole (PRILOSEC) 20 MG capsule, Take 1 capsule (20 mg total) by mouth 2 (two) times daily before a meal., Disp: 180 capsule, Rfl: 1  Allergies  Allergen Reactions   Celebrex [Celecoxib] Anaphylaxis   Glipizide Itching   Penicillins Other (See Comments)    "Burn marks from inside out"    Sulfa Antibiotics Itching   ROS neg/noncontributory except as noted HPI/below  Objective:     BP 110/74   Pulse (!) 105   Temp (!) 97.1 F (36.2 C) (Temporal)   Resp 16   Ht 5\' 3"  (1.6 m)   Wt 167 lb (75.8 kg)   LMP  (LMP Unknown)   SpO2 95%   BMI 29.58 kg/m  Wt Readings from Last 3 Encounters:  04/30/23 167 lb (75.8 kg)  04/19/23 170 lb (77.1 kg)  03/11/23 167 lb 6.4 oz (75.9 kg)    Physical Exam   Gen: WDWN NAD HEENT: NCAT, conjunctiva not injected, sclera nonicteric TM WNL B, OP moist, no exudates  NECK:  supple, no thyromegaly, no nodes, no carotid bruits CARDIAC: tachy RRR, S1S2+, no murmur.  LUNGS: CTAB. No wheezes.  Congested cough. EXT:  no edema MSK: no gross abnormalities.  NEURO: A&O x3.  CN II-XII intact.  PSYCH: normal mood. Good eye contact     Assessment & Plan:  Subacute cough  Gastroesophageal reflux disease -     Omeprazole; Take 1 capsule (20 mg total) by mouth 2 (two) times daily before a meal.  Dispense: 180 capsule; Refill: 1  Other orders -     Azithromycin; Take 2 tablets on day 1, then 1 tablet daily on days 2 through 5  Dispense: 6 tablet; Refill: 0 -     guaiFENesin-Codeine; Take 5 mLs by mouth at bedtime as needed for cough.  Dispense: 70 mL; Refill: 0   Cough-seems more trachea.  ?GERD, ?heart,?bronchitis, other.   Will do zpk, robitussin AC(PDMP checked), increase omeprazole to 20 mg twice daily.  Try albuterol twice daily-three times daily.  If not helping, take extra dose lasix.  Check CXR(patient leaving town now so will do when  back).   Sees pulmonary 7/15.  Just got off prednisone-no help Sees ophth later this month.  DM-sugars up some from prednisone.    Return if symptoms worsen or fail to improve.  Angelena Sole, MD

## 2023-04-30 NOTE — Patient Instructions (Addendum)
get X-ray/labs at [facility].  [address]  hours 8=M-F 8:30-5.  closed 12:30-1 lunch   Try albuterol more often Take to omeprazole twice/day Take zpack  Take extra lasix once and see.

## 2023-05-13 ENCOUNTER — Other Ambulatory Visit: Payer: Self-pay | Admitting: *Deleted

## 2023-05-13 MED ORDER — BD PEN NEEDLE MINI U/F 31G X 5 MM MISC: 3 refills | Status: DC

## 2023-05-23 ENCOUNTER — Ambulatory Visit (INDEPENDENT_AMBULATORY_CARE_PROVIDER_SITE_OTHER): Payer: 59 | Admitting: Family Medicine

## 2023-05-23 ENCOUNTER — Encounter: Payer: Self-pay | Admitting: Family Medicine

## 2023-05-23 VITALS — BP 100/60 | HR 93 | Temp 97.7°F | Resp 18 | Ht 63.0 in | Wt 167.0 lb

## 2023-05-23 DIAGNOSIS — E1169 Type 2 diabetes mellitus with other specified complication: Secondary | ICD-10-CM

## 2023-05-23 DIAGNOSIS — I152 Hypertension secondary to endocrine disorders: Secondary | ICD-10-CM

## 2023-05-23 DIAGNOSIS — R600 Localized edema: Secondary | ICD-10-CM | POA: Diagnosis not present

## 2023-05-23 DIAGNOSIS — Z794 Long term (current) use of insulin: Secondary | ICD-10-CM

## 2023-05-23 DIAGNOSIS — E785 Hyperlipidemia, unspecified: Secondary | ICD-10-CM | POA: Diagnosis not present

## 2023-05-23 DIAGNOSIS — R002 Palpitations: Secondary | ICD-10-CM | POA: Diagnosis not present

## 2023-05-23 DIAGNOSIS — E119 Type 2 diabetes mellitus without complications: Secondary | ICD-10-CM

## 2023-05-23 DIAGNOSIS — E039 Hypothyroidism, unspecified: Secondary | ICD-10-CM

## 2023-05-23 DIAGNOSIS — E1159 Type 2 diabetes mellitus with other circulatory complications: Secondary | ICD-10-CM | POA: Diagnosis not present

## 2023-05-23 LAB — COMPREHENSIVE METABOLIC PANEL
ALT: 32 U/L (ref 0–35)
AST: 47 U/L — ABNORMAL HIGH (ref 0–37)
Albumin: 4.2 g/dL (ref 3.5–5.2)
Alkaline Phosphatase: 101 U/L (ref 39–117)
BUN: 32 mg/dL — ABNORMAL HIGH (ref 6–23)
CO2: 27 mEq/L (ref 19–32)
Calcium: 9.8 mg/dL (ref 8.4–10.5)
Chloride: 99 mEq/L (ref 96–112)
Creatinine, Ser: 0.94 mg/dL (ref 0.40–1.20)
GFR: 58.46 mL/min — ABNORMAL LOW (ref >60.00)
Glucose, Bld: 160 mg/dL — ABNORMAL HIGH (ref 70–99)
Potassium: 4.8 mEq/L (ref 3.5–5.1)
Sodium: 135 mEq/L (ref 135–145)
Total Bilirubin: 0.5 mg/dL (ref 0.2–1.2)
Total Protein: 7.1 g/dL (ref 6.0–8.3)

## 2023-05-23 LAB — CBC WITH DIFFERENTIAL/PLATELET
Basophils Absolute: 0.1 10*3/uL (ref 0.0–0.1)
Basophils Relative: 1.1 % (ref 0.0–3.0)
Eosinophils Absolute: 0.2 10*3/uL (ref 0.0–0.7)
Eosinophils Relative: 3.4 % (ref 0.0–5.0)
HCT: 37.1 % (ref 36.0–46.0)
Hemoglobin: 12.1 g/dL (ref 12.0–15.0)
Lymphocytes Relative: 35.6 % (ref 12.0–46.0)
Lymphs Abs: 2.3 10*3/uL (ref 0.7–4.0)
MCHC: 32.5 g/dL (ref 30.0–36.0)
MCV: 75.3 fl — ABNORMAL LOW (ref 78.0–100.0)
Monocytes Absolute: 0.4 10*3/uL (ref 0.1–1.0)
Monocytes Relative: 6.8 % (ref 3.0–12.0)
Neutro Abs: 3.5 10*3/uL (ref 1.4–7.7)
Neutrophils Relative %: 53.1 % (ref 43.0–77.0)
Platelets: 324 10*3/uL (ref 150.0–400.0)
RBC: 4.92 Mil/uL (ref 3.87–5.11)
RDW: 16.3 % — ABNORMAL HIGH (ref 11.5–15.5)
WBC: 6.5 10*3/uL (ref 4.0–10.5)

## 2023-05-23 LAB — LIPID PANEL
Cholesterol: 140 mg/dL (ref 0–200)
HDL: 26.1 mg/dL — ABNORMAL LOW (ref >39.00)
NonHDL: 114.16
Total CHOL/HDL Ratio: 5
Triglycerides: 247 mg/dL — ABNORMAL HIGH (ref 0.0–149.0)
VLDL: 49.4 mg/dL — ABNORMAL HIGH (ref 0.0–40.0)

## 2023-05-23 LAB — LDL CHOLESTEROL, DIRECT: Direct LDL: 74 mg/dL

## 2023-05-23 LAB — TSH: TSH: 3.2 u[IU]/mL (ref 0.35–5.50)

## 2023-05-23 LAB — HEMOGLOBIN A1C: Hgb A1c MFr Bld: 8.5 % — ABNORMAL HIGH (ref 4.6–6.5)

## 2023-05-23 LAB — VITAMIN B12: Vitamin B-12: 506 pg/mL (ref 211–911)

## 2023-05-23 NOTE — Progress Notes (Signed)
Subjective:     Patient ID: Teresa Rice, female    DOB: 1946-03-20, 77 y.o.   MRN: [id number]  Chief Complaint  Patient presents with   Medical Management of Chronic Issues    3 month follow-up on dm Fasting     HPI  DM type 2-metformin 1000mg  bid, ozempic 2mg  weekly, lantus 35u.  Sugars running - 210 this am.  Eating more fruit rather than junk food. Walks some but hard to breathe in heat.  Has cgm 167 now(hasn't eaten yet).  If not eat late, sugar 120-130 in am. Gets bored so eats more.  Looking for ophth HTN-Pt is on cartia 240, valsartan 160, metoprolol 50(more for palpitations).  Bp's running 120's/70's.  No ha/dizziness/cp/palp.  Palpitations better.  DOE + at times.  HR usu 90's  Occ 110.   HLD-fenofibrate 160 and crestor 10mg  Hypothyroidism-on synthroid 0.025mg . Edema-on lasix 20mg  bid per pt per Card.-better.    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   Mild intermittent asthma    OA (osteoarthritis)    fingers    Palpitations 09/04/2022   Trigger finger of both hands    wear slints and special gloves at night   Type 2 diabetes mellitus [facility])     Past Surgical History:  Procedure Laterality Date   BLADDER SURGERY  2012   in [place]   TURBT   CHOLECYSTECTOMY  1970   CYSTOSCOPY/RETROGRADE/URETEROSCOPY Left 05/24/2014   Procedure: CYSTOSCOPY/RETROGRADE/ URETEROSCOPY;  Surgeon: Chelsea Aus, MD;  Location: [facility];  Service: Urology;  Laterality: Left;   TRANSURETHRAL RESECTION OF BLADDER TUMOR N/A 05/24/2014   Procedure: TRANSURETHRAL RESECTION OF BLADDER  TUMOR (TURBT) ;  Surgeon: Chelsea Aus, MD;  Location: [facility];  Service: Urology;  Laterality: N/A;   TRANSURETHRAL RESECTION OF BLADDER TUMOR N/A 08/04/2015   Procedure: TRANSURETHRAL RESECTION OF BLADDER TUMOR (TURBT);  Surgeon: Marcine Matar, MD;  Location: [facility];  Service: Urology;  Laterality: N/A;     Current Outpatient Medications:    Accu-Chek Softclix Lancets lancets, , Disp: , Rfl:    albuterol (VENTOLIN HFA) 108 (90 Base) MCG/ACT inhaler, INHALE 2 PUFFS BY MOUTH EVERY 6 HOURS AS NEEDED FOR WHEEZING OR SHORTNESS OF BREATH, Disp: 9 g, Rfl: 1   ALPRAZolam (XANAX) 1 MG tablet, TAKE 1/2 (ONE-HALF) TABLET BY MOUTH THREE TIMES DAILY AND 3/4 (THREE-FOURTHS) TABLET AT BEDTIME, Disp: 71 tablet, Rfl: 5   azelastine (ASTELIN) 0.1 % nasal spray, Place 1 spray into both nostrils 2 (two) times daily. Use in each nostril as directed, Disp: 90 mL, Rfl: 3   B-D UF III MINI PEN NEEDLES 31G X 5 MM MISC, SMARTSIG:1 Pen Needle SUB-Q Daily, Disp: 100 each, Rfl: 3   baclofen (LIORESAL) 10 MG tablet, Take 0.5 tablets (5 mg total) by mouth 2 (two) times daily as needed for muscle spasms., Disp: 30 each, Rfl: 0   blood glucose meter kit and supplies, Dispense based on patient and insurance preference. Use up to four times daily as directed. (FOR ICD-10 E10.9,  E11.9)., Disp: 1 each, Rfl: 0   Calcium Carbonate (CALCIUM 600 PO), Take 1 tablet by mouth daily in the afternoon., Disp: , Rfl:    cholecalciferol (VITAMIN D3) 25 MCG (1000 UNIT) tablet, Take 1,000 Units by mouth daily., Disp: , Rfl:    Continuous Glucose Sensor (FREESTYLE LIBRE 2 SENSOR) MISC, Use to test blood sugar daily, Disp: 1 each, Rfl: 1   Continuous Glucose Sensor (FREESTYLE LIBRE 2 SENSOR) MISC, Place once sensor every 14 days., Disp: 6 each, Rfl: 3   diltiazem (CARTIA XT) 240 MG 24 hr capsule, Take 1 capsule (240 mg total) by mouth daily., Disp: 90 capsule, Rfl: 1   fenofibrate 160 MG tablet, Take 1 tablet by mouth  once daily, Disp: 90 tablet, Rfl: 0   furosemide (LASIX) 20 MG tablet, Take 1 tablet (20 mg total) by mouth daily., Disp: 90 tablet, Rfl: 1   glucose blood test strip, Use as instructed, Disp: 100 each, Rfl: 12   guaiFENesin-codeine (ROBITUSSIN AC) 100-10 MG/5ML syrup, Take 5 mLs by mouth at bedtime as needed for cough., Disp: 70 mL, Rfl: 0   LANTUS SOLOSTAR 100 UNIT/ML Solostar Pen, INJECT 15 UNITS SUBCUTANEOUSLY ONCE DAILY AT NIGHT, Disp: 15 mL, Rfl: 0   levothyroxine (SYNTHROID) 25 MCG tablet, Take one pill daily for thyroid, Disp: 90 tablet, Rfl: 3   magnesium oxide (MAG-OX) 400 MG tablet, Take 400 mg by mouth 2 (two) times daily., Disp: , Rfl:    metFORMIN (GLUCOPHAGE) 1000 MG tablet, TAKE 1 TABLET BY MOUTH TWICE DAILY WITH A MEAL, Disp: 180 tablet, Rfl: 0   metoprolol succinate (TOPROL XL) 50 MG 24 hr tablet, Take 1 tablet (50 mg total) by mouth daily., Disp: 90 tablet, Rfl: 3   Multiple Vitamin (MULTIVITAMIN WITH MINERALS) TABS tablet, Take 1 tablet by mouth daily., Disp: , Rfl:    nystatin cream (MYCOSTATIN), Apply topically., Disp: , Rfl:    Omega 3 1000 MG CAPS, Take 1,000 mg by mouth daily., Disp: , Rfl:    omeprazole (PRILOSEC) 20 MG capsule, Take 1 capsule (20 mg total) by mouth 2 (two) times daily before a meal., Disp: 180 capsule, Rfl: 1   OZEMPIC, 2 MG/DOSE, 8 MG/3ML SOPN, Inject into the skin., Disp: , Rfl:    Polyethylene Glycol 3350 (MIRALAX PO), Take by mouth every evening., Disp: , Rfl:    rosuvastatin (CRESTOR) 10 MG tablet, Take 1 tablet by mouth once daily, Disp: 90 tablet, Rfl: 1   SYMBICORT 160-4.5 MCG/ACT inhaler, Inhale 2 puffs into the lungs 2 (two) times daily., Disp: 3 each, Rfl: 3   traMADol (ULTRAM) 50 MG tablet, Take 1 tablet by mouth every 6 (six) hours as needed., Disp: , Rfl:    triamcinolone cream (KENALOG) 0.1 %, Apply topically 3 (three) times daily., Disp: , Rfl:    valsartan (DIOVAN) 160 MG tablet, Take 1 tablet (160 mg total) by mouth daily., Disp: 90  tablet, Rfl: 3   Vilazodone HCl 20 MG TABS, Take 1 tablet (20 mg total) by mouth daily., Disp: 90 tablet, Rfl: 1   VITAMIN E PO, Take by mouth., Disp: , Rfl:   Allergies  Allergen Reactions   Celebrex [Celecoxib] Anaphylaxis   Glipizide Itching   Penicillins Other (See Comments)    "Burn marks from inside out"    Sulfa Antibiotics Itching   ROS neg/noncontributory except as noted HPI/below Constipation-occ miralax      Objective:     BP 100/60 (BP Location: Left Arm, Patient Position: Sitting,  Cuff Size: Normal)   Pulse 93   Temp 97.7 F (36.5 C) (Temporal)   Resp 18   Ht 5\' 3"  (1.6 m)   Wt 167 lb (75.8 kg)   LMP  (LMP Unknown)   SpO2 98%   BMI 29.58 kg/m  Wt Readings from Last 3 Encounters:  05/23/23 167 lb (75.8 kg)  04/30/23 167 lb (75.8 kg)  04/19/23 170 lb (77.1 kg)    Physical Exam   Gen: WDWN NAD.  HOH HEENT: NCAT, conjunctiva not injected, sclera nonicteric NECK:  supple, no thyromegaly, no nodes, no carotid bruits CARDIAC:tachy RRR, S1S2+, no murmur. Intermitt ectopic beats. DP 2+B LUNGS: CTAB. No wheezes ABDOMEN:  BS+, soft, NTND, No HSM, no masses EXT:  no edema MSK: no gross abnormalities.  NEURO: A&O x3.  CN II-XII intact.  PSYCH: normal mood. Good eye contact  Reviewed card notes     Assessment & Plan:  Type 2 diabetes mellitus with insulin therapy (HCC) Assessment & Plan: Chronic.  Not controlled.  Too many indiscretions.  Increase exercise.  Don't eat for boredom.  Distract self.  Continue ozempic 2mg  weekly, metformin 1000mg  bid, lantus 35 units daily.  May need SGLT-2.  Get eye exam done  Orders: -     Hemoglobin A1c -     Comprehensive metabolic panel -     Vitamin B12  Hypertension associated with diabetes (HCC) Assessment & Plan: Chronic.  Overcontrolled.  Per card note, will decrease valsartan to 1/2.  Continue cartia 240mg  and metoprolol 50mg .  Monitor bp's and let us know in 1 wk  Orders: -     CBC with  Differential/Platelet  Hyperlipidemia associated with type 2 diabetes mellitus (HCC) Assessment & Plan: Chronic.  Not ideal-trigs high still.  Continue crestor 10mg  and fenofibrate 160mg  and get sugars controlled.   Orders: -     Lipid panel  Acquired hypothyroidism Assessment & Plan: Chronic.  Controlled.  Continue synthroid 0.025mg  daily  Orders: -     TSH  Localized edema Assessment & Plan: Chronic.  Well controlled.  Pt doing lasix 20mg  bid.  If K low, will decrease to daily   Palpitations Assessment & Plan: Chronic.  Better controlled since metoprolol increased to 50mg .       Return in about 3 months (around 08/23/2023) for chronic follow-up.  Angelena Sole, MD

## 2023-05-23 NOTE — Assessment & Plan Note (Signed)
Chronic.  Well controlled.  Pt doing lasix 20mg  bid.  If K low, will decrease to daily

## 2023-05-23 NOTE — Assessment & Plan Note (Addendum)
Chronic.  Not controlled.  Too many indiscretions.  Increase exercise.  Don't eat for boredom.  Distract self.  Continue ozempic 2mg  weekly, metformin 1000mg  bid, lantus 35 units daily.  May need SGLT-2.  Get eye exam done

## 2023-05-23 NOTE — Assessment & Plan Note (Signed)
Chronic.  Overcontrolled.  Per card note, will decrease valsartan to 1/2.  Continue cartia 240mg  and metoprolol 50mg .  Monitor bp's and let us know in 1 wk

## 2023-05-23 NOTE — Progress Notes (Signed)
Triglycerides still elevated-partly from sugars.  Work more on diet. Sugars too high-add jardiance 10mg  daily(needs to make sure wipes well after urination to prevent getting yeast).  Increase insulin to 37 units.  Let me know sugars in 1-2 wks.

## 2023-05-23 NOTE — Assessment & Plan Note (Signed)
Chronic.  Not ideal-trigs high still.  Continue crestor 10mg  and fenofibrate 160mg  and get sugars controlled.

## 2023-05-23 NOTE — Assessment & Plan Note (Signed)
Chronic.  Better controlled since metoprolol increased to 50mg .

## 2023-05-23 NOTE — Assessment & Plan Note (Signed)
Chronic.  Controlled.  Continue synthroid 0.025mg  daily

## 2023-05-23 NOTE — Patient Instructions (Addendum)
Take miralax daily to prevent constipation.    Cut the valsartan in 1/2 and monitor blood pressures daily and let us know in 1 week.

## 2023-05-24 ENCOUNTER — Other Ambulatory Visit: Payer: Self-pay | Admitting: *Deleted

## 2023-05-24 MED ORDER — EMPAGLIFLOZIN 10 MG PO TABS: 10.0000 mg | ORAL_TABLET | Freq: Every day | ORAL | 0 refills | Status: DC

## 2023-06-06 ENCOUNTER — Other Ambulatory Visit: Payer: Self-pay | Admitting: Family Medicine

## 2023-06-06 ENCOUNTER — Telehealth: Payer: Self-pay | Admitting: *Deleted

## 2023-06-06 DIAGNOSIS — Z79899 Other long term (current) drug therapy: Secondary | ICD-10-CM

## 2023-06-06 MED ORDER — EMPAGLIFLOZIN 10 MG PO TABS: 10.0000 mg | ORAL_TABLET | Freq: Every day | ORAL | 0 refills | Status: DC

## 2023-06-06 MED ORDER — EMPAGLIFLOZIN 10 MG PO TABS: 10.0000 mg | ORAL_TABLET | Freq: Every day | ORAL | 1 refills | Status: DC

## 2023-06-06 NOTE — Telephone Encounter (Signed)
Patient called to give blood sugar readings since starting Jardiance 10 mg. Readings below: 7/11:125 7/10: 122 7/9: 97 7/8: 117 7/7: 160, stated she ate before going to bed 7/6: 124 7/5: 93 7/4: 161 7/3: 124 7/2: 105 7/1: 130 6/30: 117 6/29: 109 6/28: 103 Patient stated last blood pressure reading was 124/72, stated she does not check every day. Patient requested refill of Jardiance.

## 2023-06-06 NOTE — Telephone Encounter (Signed)
Patient notified of message below. Scheduled lab appt.

## 2023-06-10 ENCOUNTER — Encounter (HOSPITAL_BASED_OUTPATIENT_CLINIC_OR_DEPARTMENT_OTHER): Payer: Self-pay | Admitting: Family

## 2023-06-10 ENCOUNTER — Ambulatory Visit (HOSPITAL_BASED_OUTPATIENT_CLINIC_OR_DEPARTMENT_OTHER): Payer: 59 | Admitting: Family

## 2023-06-10 VITALS — BP 106/64 | HR 91 | Ht 63.0 in | Wt 167.5 lb

## 2023-06-10 DIAGNOSIS — E1165 Type 2 diabetes mellitus with hyperglycemia: Secondary | ICD-10-CM | POA: Diagnosis not present

## 2023-06-10 DIAGNOSIS — I4711 Inappropriate sinus tachycardia, so stated: Secondary | ICD-10-CM

## 2023-06-10 DIAGNOSIS — R002 Palpitations: Secondary | ICD-10-CM

## 2023-06-10 DIAGNOSIS — I10 Essential (primary) hypertension: Secondary | ICD-10-CM

## 2023-06-10 DIAGNOSIS — Z794 Long term (current) use of insulin: Secondary | ICD-10-CM

## 2023-06-10 DIAGNOSIS — R600 Localized edema: Secondary | ICD-10-CM

## 2023-06-10 MED ORDER — VALSARTAN 80 MG PO TABS
80.0000 mg | ORAL_TABLET | Freq: Every day | ORAL | 3 refills | Status: DC
Start: 2023-06-10 — End: 2024-05-04

## 2023-06-10 MED ORDER — METOPROLOL SUCCINATE ER 100 MG PO TB24
100.0000 mg | ORAL_TABLET | Freq: Every day | ORAL | 2 refills | Status: DC
Start: 2023-06-10 — End: 2023-12-09

## 2023-06-10 NOTE — Patient Instructions (Addendum)
Medication Instructions:   Continue Valsartan half tablet daily.  We have sent a new prescription so that you can pick up the lower strength tablet. When you pick up the 80mg  tablet you will take ONE tablet per day.   INCREASE Metoprolol to 100mg  daily You may use up your 50mg  tablets by taking TWO together We have sent a prescription for the stronger tablet to [facility]  *If you need a refill on your cardiac medications before your next appointment, please call your pharmacy*  Follow-Up: At [facility], you and your health needs are our priority.  As part of our continuing mission to provide you with exceptional heart care, we have created designated Provider Care Teams.  These Care Teams include your primary Cardiologist (physician) and Advanced Practice Providers (APPs -  Physician Assistants and Nurse Practitioners) who all work together to provide you with the care you need, when you need it.  We recommend signing up for the patient portal called "MyChart".  Sign up information is provided on this After Visit Summary.  MyChart is used to connect with patients for Virtual Visits (Telemedicine).  Patients are able to view lab/test results, encounter notes, upcoming appointments, etc.  Non-urgent messages can be sent to your provider as well.   To learn more about what you can do with MyChart, go to [email or web address].    Your next appointment:   6 month(s)  Provider:   Chilton Si, MD or Gillian Shields, NP    Other Instructions  Your prior heart monitor showed inappropriate sinus tachycardia. This is a fast heart rhythm that is regular. It can make you feel like your heart is racing or you are anxious, but it is not dangerous. It will not cause heart attacks or stroke. Your Metoprolol and Diltiazem help to prevent it from happening, but you can still have occasional episodes.   To prevent palpitations: Make sure you are adequately hydrated.  Avoid and/or limit  caffeine containing beverages like soda or tea. Exercise regularly.  Manage stress well. Some over the counter medications can cause palpitations such as Benadryl, AdvilPM, TylenolPM. Regular Advil or Tylenol do not cause palpitations.     Pursed Lip Breathing This will be helpful when you are feeling anxious or experiencing palpitations Pursed lip breathing is a technique to relieve the feeling of being short of breath.  Being short of breath can make you tense and anxious. Before you start this breathing exercise, take a minute to relax your shoulders and close your eyes. Then: Start the exercise by closing your mouth. Breathe in through your nose, taking a normal breath. You can do this at your normal rate of breathing. If you feel you are not getting enough air, breathe in while slowly counting to 2 or 3. Pucker (purse) your lips as if you were going to whistle. Gently tighten the muscles of your abdomenor press on your abdomen to help push the air out. Breathe out slowly through your pursed lips. Take at least twice as long to breathe out as it takes you to breathe in. Make sure that you breathe out all of the air, but do not force air out.

## 2023-06-10 NOTE — Progress Notes (Signed)
Cardiology Office Note:  .   Date:  06/10/2023  ID:  Teresa Rice, DOB 1946-02-10, MRN [id number] PCP: Jeani Sow, MD  [facility] Providers Cardiologist:  Chilton Si, MD    History of Present Illness: .   Teresa Rice is a 77 y.o. female with a hx of tachycardia, HTN, anxiety, Dm2 last seen 03/11/23.   Saw PCP 06/2022 noting palpitations with EKG revealing PACs.  At follow-up later that month metoprolol was increased to 75 mg and she was referred to cardiology.   Established with Dr. Duke Salvia 09/04/2022.Metoprolol was stopped and she was started on diltiazem 240 mg daily.  7-day ZIO ordered with preliminary report with NSR and rare PVC/PAC <1% burden with no significant arrhythmia.Echo 09/19/22 with normal LVEF 55-60%, no RWMA, mild asymmetric LVH   At visit 10/17/2023 losartan was stopped and transition to valsartan 160 mg daily for improved blood pressure control.  Home sleep study was ordered but not performed.   At visit 01/07/2023 Metoprolol succinate 25 mg daily added for improved heart rate control.   Last seen 03/11/23 due to palpitations Metoprolol succinate was increased ot 50mg  daily. She was sleeping better and politely declined sleep study.   Presents today for follow up independently. Pleasant lady who is excited to welcome her third great grandchild in September. . Notes over the weekend she forgot her heart pills while visiting her daughter and felt "over anxious in my chest". As soon as she resumed her Metoprolol it resolved. Notes she did have another episode after talking with her grandson in [place]. Her primary care has reduced her dose of Valsartan to half tablet due to hypotension. She has been drinking a lot of water. Continues to monitor on heart rate as high as 120. Reassurance provided sinus tachycardia is benign. She has [employer] and is walking on the treadmill.   ROS: Please see the history of present illness.    All other  systems reviewed and are negative.   Studies Reviewed: .        Cardiac Studies & Procedures       ECHOCARDIOGRAM  ECHOCARDIOGRAM COMPLETE 09/19/2022  Narrative ECHOCARDIOGRAM REPORT    Patient Name:   Teresa Rice Date of Exam: 09/19/2022 Medical Rec #:  [id number]       Height:       63.0 in Accession #:    [id number]      Weight:       172.6 lb Date of Birth:  [birth year]      BSA:          1.816 m Patient Age:    76 years        BP:           130/70 mmHg Patient Gender: F               HR:           97 bpm. Exam Location:  Outpatient  Procedure: 2D Echo, 3D Echo, Strain Analysis, Color Doppler and Cardiac Doppler  Indications:    R00.2 Palpitations  History:        Patient has no prior history of Echocardiogram examinations. Risk Factors:Dyslipidemia, Diabetes, Hypertension and Former Smoker. Palpitations.  Sonographer:    Jeryl Columbia RDCS Referring Phys: [id number] TIFFANY [place]  IMPRESSIONS   1. Left ventricular ejection fraction, by estimation, is 55 to 60%. The left ventricle has normal function. The left ventricle has no regional wall motion abnormalities. There  is mild asymmetric left ventricular hypertrophy of the basal-septal segment. Left ventricular diastolic parameters are consistent with Grade I diastolic dysfunction (impaired relaxation). 2. Right ventricular systolic function is normal. The right ventricular size is normal. Tricuspid regurgitation signal is inadequate for assessing PA pressure. 3. The mitral valve is normal in structure. Mild mitral valve regurgitation. 4. The aortic valve is tricuspid. There is mild calcification of the aortic valve. There is mild thickening of the aortic valve. Aortic valve regurgitation is not visualized. Aortic valve sclerosis/calcification is present, without any evidence of aortic stenosis. 5. The inferior vena cava is normal in size with greater than 50% respiratory variability, suggesting right atrial  pressure of 3 mmHg.  Comparison(s): No prior Echocardiogram.  FINDINGS Left Ventricle: Left ventricular ejection fraction, by estimation, is 55 to 60%. The left ventricle has normal function. The left ventricle has no regional wall motion abnormalities. Global longitudinal strain performed but not reported based on interpreter judgement due to suboptimal tracking. 3D left ventricular ejection fraction analysis performed but not reported based on interpreter judgement due to suboptimal tracking. The left ventricular internal cavity size was normal in size. There is mild asymmetric left ventricular hypertrophy of the basal-septal segment. Left ventricular diastolic parameters are consistent with Grade I diastolic dysfunction (impaired relaxation).  Right Ventricle: The right ventricular size is normal. No increase in right ventricular wall thickness. Right ventricular systolic function is normal. Tricuspid regurgitation signal is inadequate for assessing PA pressure.  Left Atrium: Left atrial size was normal in size.  Right Atrium: Right atrial size was normal in size.  Pericardium: There is no evidence of pericardial effusion.  Mitral Valve: The mitral valve is normal in structure. Mild mitral annular calcification. Mild mitral valve regurgitation.  Tricuspid Valve: The tricuspid valve is normal in structure. Tricuspid valve regurgitation is trivial.  Aortic Valve: The aortic valve is tricuspid. There is mild calcification of the aortic valve. There is mild thickening of the aortic valve. Aortic valve regurgitation is not visualized. Aortic valve sclerosis/calcification is present, without any evidence of aortic stenosis.  Pulmonic Valve: The pulmonic valve was normal in structure. Pulmonic valve regurgitation is mild.  Aorta: The aortic root is normal in size and structure.  Venous: The inferior vena cava is normal in size with greater than 50% respiratory variability, suggesting right  atrial pressure of 3 mmHg.  IAS/Shunts: The atrial septum is grossly normal.   LEFT VENTRICLE PLAX 2D LVIDd:         2.80 cm   Diastology LVIDs:         1.71 cm   LV e' medial:    4.84 cm/s LV PW:         1.11 cm   LV E/e' medial:  10.2 LV IVS:        1.12 cm   LV e' lateral:   7.51 cm/s LVOT diam:     1.80 cm   LV E/e' lateral: 6.6 LV SV:         39 LV SV Index:   22 LVOT Area:     2.54 cm  3D Volume EF: 3D EF:        51 % LV EDV:       95 ml LV ESV:       46 ml LV SV:        48 ml  RIGHT VENTRICLE RV Basal diam:  3.61 cm RV Mid diam:    2.23 cm RV S prime:  9.30 cm/s TAPSE (M-mode): 0.9 cm  LEFT ATRIUM             Index        RIGHT ATRIUM           Index LA diam:        2.50 cm 1.38 cm/m   RA Area:     10.40 cm LA Vol (A2C):   22.1 ml 12.17 ml/m  RA Volume:   24.00 ml  13.21 ml/m LA Vol (A4C):   24.9 ml 13.71 ml/m LA Biplane Vol: 25.7 ml 14.15 ml/m AORTIC VALVE LVOT Vmax:   97.90 cm/s LVOT Vmean:  63.800 cm/s LVOT VTI:    0.154 m  AORTA Ao Root diam: 2.35 cm Ao Asc diam:  2.80 cm  MITRAL VALVE MV Area (PHT): 5.06 cm    SHUNTS MV Decel Time: 150 msec    Systemic VTI:  0.15 m MV E velocity: 49.30 cm/s  Systemic Diam: 1.80 cm MV A velocity: 75.30 cm/s MV E/A ratio:  0.65  Laurance Flatten MD Electronically signed by Laurance Flatten MD Signature Date/Time: 09/19/2022/4:15:33 PM    Final    MONITORS  LONG TERM MONITOR (3-14 DAYS) 10/01/2022  Narrative 10 Day Zio Monitor  Quality: Fair.  Baseline artifact. Predominant rhythm: sinus rhythm Average heart rate: 93 bpm Max heart rate: 136 bpm Min heart rate: 73 bpm Pauses >2.5 seconds: none Occasional (1%) PACs.  Rare PVCs   Tiffany C. Duke Salvia, MD, [facility] 11/21/2022 5:49 PM           Risk Assessment/Calculations:         STOP-Bang Score:  5      Physical Exam:   VS:  BP 106/64 (BP Location: Left Arm, Patient Position: Sitting, Cuff Size: Large)   Pulse 91   Ht 5\' 3"  (1.6 m)    Wt 167 lb 8 oz (76 kg)   LMP  (LMP Unknown)   SpO2 97%   BMI 29.67 kg/m    Wt Readings from Last 3 Encounters:  06/10/23 167 lb 8 oz (76 kg)  05/23/23 167 lb (75.8 kg)  04/30/23 167 lb (75.8 kg)    GEN: Well nourished, well developed in no acute distress NECK: No JVD; No carotid bruits CARDIAC: RRR, no murmurs, rubs, gallops RESPIRATORY:  Clear to auscultation without rales, wheezing or rhonchi  ABDOMEN: Soft, non-tender, non-distended EXTREMITIES:  No edema; No deformity   ASSESSMENT AND PLAN: .    HTN -  BP at goal of <130/80. Continue Diltiazem 240mg , Valsartan 80mg .  Increasing metoprolol succinate to 100 mg as below for palpitations. She is presently splitting the valsartan 160mg  in half, will send in 80mg  tablet. Could further reduce dose of Valsartan if she has persistent hypotension. Discussed to monitor BP at home at least 2 hours after medications and sitting for 5-10 minutes.    LE edema -   Likely etiology venous insufficiency as echo 08/2022 normal LVEF.  Continue Lasix 20mg  daily. Encouraged to continue wearing her compression stockings.    Inappropriate sinus tachycardia / Palpitations - Prior monitor 09/2022 with inappropriate sinus tachycardia. Still bothered by palpitations. Reassurance provided these are benign. Increase Toprol to 100mg  every day. Phone call in 2 weeks to check in on her. Encouraged to continue to avoid caffeine, stay well hydrated. Anticipate some of her palpitations are related to anxiety. Handout provided on pursed lip breathing to help during episodes.    Snores - STOP Bang 5. Sleep study previously ordered.  However, since then sleep has been better and she has politely declined sleep study.    DM2 - 01/2023 A1c 8.3 ? 04/2023 A1c 8.5. Continue to follow with PCP.    HLD - Continue Rosuvastatin 10mg  daily.        Dispo: follow up in in 6 months with Dr. Duke Salvia or APP  Signed, Alver Sorrow, NP

## 2023-06-11 ENCOUNTER — Other Ambulatory Visit: Payer: Self-pay | Admitting: Family Medicine

## 2023-06-12 ENCOUNTER — Other Ambulatory Visit: Payer: Self-pay | Admitting: *Deleted

## 2023-06-12 ENCOUNTER — Telehealth: Payer: Self-pay | Admitting: Family Medicine

## 2023-06-12 NOTE — Telephone Encounter (Signed)
Prescription Request  06/12/2023  LOV: 05/23/2023  What is the name of the medication or equipment?  LANTUS SOLOSTAR 100 UNIT/ML Solostar Pen  Have you contacted your pharmacy to request a refill? Yes   Which pharmacy would you like this sent to?  [facility] [address] [place], [place] - [address] [place] [address] Phone: [phone] Fax: [phone]     Patient notified that their request is being sent to the clinical staff for review and that they should receive a response within 2 business days.   Please advise at Mobile [phone] (mobile)

## 2023-06-13 ENCOUNTER — Other Ambulatory Visit: Payer: Self-pay | Admitting: *Deleted

## 2023-06-13 ENCOUNTER — Other Ambulatory Visit: Payer: Self-pay | Admitting: Family Medicine

## 2023-06-13 MED ORDER — LANTUS SOLOSTAR 100 UNIT/ML ~~LOC~~ SOPN: 40.0000 [IU] | PEN_INJECTOR | Freq: Every day | SUBCUTANEOUS | 3 refills | Status: DC

## 2023-06-13 NOTE — Telephone Encounter (Signed)
Pt is calling back about the RX. Please advise.

## 2023-06-14 ENCOUNTER — Other Ambulatory Visit: Payer: Self-pay | Admitting: Family Medicine

## 2023-06-14 DIAGNOSIS — K219 Gastro-esophageal reflux disease without esophagitis: Secondary | ICD-10-CM

## 2023-06-20 ENCOUNTER — Other Ambulatory Visit (INDEPENDENT_AMBULATORY_CARE_PROVIDER_SITE_OTHER): Payer: 59

## 2023-06-20 DIAGNOSIS — Z79899 Other long term (current) drug therapy: Secondary | ICD-10-CM

## 2023-06-20 LAB — BASIC METABOLIC PANEL
BUN: 17 mg/dL (ref 6–23)
CO2: 28 mEq/L (ref 19–32)
Calcium: 9.6 mg/dL (ref 8.4–10.5)
Chloride: 103 mEq/L (ref 96–112)
Creatinine, Ser: 0.95 mg/dL (ref 0.40–1.20)
GFR: 57.69 mL/min — ABNORMAL LOW (ref >60.00)
Glucose, Bld: 154 mg/dL — ABNORMAL HIGH (ref 70–99)
Potassium: 3.6 mEq/L (ref 3.5–5.1)
Sodium: 140 mEq/L (ref 135–145)

## 2023-06-20 NOTE — Progress Notes (Signed)
Kidneys stable since added the Jardiance Potassium on low end of normal-send in rx for every other day #45 and reck potassium in 1-2 wks(place order).

## 2023-07-05 ENCOUNTER — Other Ambulatory Visit: Payer: Self-pay | Admitting: Family Medicine

## 2023-07-13 ENCOUNTER — Other Ambulatory Visit: Payer: Self-pay | Admitting: Family Medicine

## 2023-07-18 ENCOUNTER — Other Ambulatory Visit: Payer: Self-pay | Admitting: Psychiatry

## 2023-07-18 DIAGNOSIS — F331 Major depressive disorder, recurrent, moderate: Secondary | ICD-10-CM

## 2023-07-18 DIAGNOSIS — F4310 Post-traumatic stress disorder, unspecified: Secondary | ICD-10-CM

## 2023-07-18 DIAGNOSIS — F4001 Agoraphobia with panic disorder: Secondary | ICD-10-CM

## 2023-07-24 ENCOUNTER — Other Ambulatory Visit: Payer: Self-pay | Admitting: Family Medicine

## 2023-07-25 ENCOUNTER — Encounter: Payer: Self-pay | Admitting: Psychiatry

## 2023-07-25 ENCOUNTER — Ambulatory Visit (INDEPENDENT_AMBULATORY_CARE_PROVIDER_SITE_OTHER): Payer: 59 | Admitting: Psychiatry

## 2023-07-25 DIAGNOSIS — G4721 Circadian rhythm sleep disorder, delayed sleep phase type: Secondary | ICD-10-CM

## 2023-07-25 DIAGNOSIS — F4001 Agoraphobia with panic disorder: Secondary | ICD-10-CM | POA: Diagnosis not present

## 2023-07-25 DIAGNOSIS — F5105 Insomnia due to other mental disorder: Secondary | ICD-10-CM | POA: Diagnosis not present

## 2023-07-25 DIAGNOSIS — F331 Major depressive disorder, recurrent, moderate: Secondary | ICD-10-CM

## 2023-07-25 DIAGNOSIS — F4310 Post-traumatic stress disorder, unspecified: Secondary | ICD-10-CM | POA: Diagnosis not present

## 2023-07-25 MED ORDER — VILAZODONE HCL 20 MG PO TABS
1.0000 | ORAL_TABLET | Freq: Every day | ORAL | 1 refills | Status: DC
Start: 2023-07-25 — End: 2024-01-27

## 2023-07-25 MED ORDER — ALPRAZOLAM 1 MG PO TABS
ORAL_TABLET | ORAL | 5 refills | Status: DC
Start: 2023-07-25 — End: 2024-01-27
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
## 2023-08-11 ENCOUNTER — Other Ambulatory Visit: Payer: Self-pay | Admitting: Family Medicine

## 2023-08-14 ENCOUNTER — Other Ambulatory Visit (HOSPITAL_BASED_OUTPATIENT_CLINIC_OR_DEPARTMENT_OTHER): Payer: Self-pay | Admitting: Cardiovascular Disease

## 2023-08-26 NOTE — Progress Notes (Signed)
Subjective:     Patient ID: Teresa Rice, female    DOB: [birth year], 77 y.o.   MRN: [id number]  No chief complaint on file.   HPI HLD - Managed with Fenofibrate 160 mg and 10 mg Crestor.   DM, type 2 - Managed with Metformin 1000 mg BID, Ozempic 2 mg weekly, Lantus 25u. Sugars running ***Diet ***Exercise***.   HTN -Managed with Cartia 240 mg, Valsartan 160 mg, and Metoprolol 50 mg (for palps). BP's running *** on initial check, *** on recheck, and *** at home. Denies ha/dizziness/cp/palp/edema/cough/sob.   Hypothyroidism - Managed with Synthroid 0.025 mg.   *** - ***  *** - ***   Health Maintenance Due  Topic Date Due   Zoster Vaccines- Shingrix (1 of 2) Never done   Medicare Annual Wellness (AWV)  12/15/2021   OPHTHALMOLOGY EXAM  02/22/2023   FOOT EXAM  06/15/2023   INFLUENZA VACCINE  06/27/2023    Past Medical History:  Diagnosis Date   Bladder cancer (HCC) first dx 2012   Recurrent Bladder Cancer--  (urologist-  dr Retta Diones)   Chronic constipation    Closed fracture of left distal radius 08/30/2016   Full dentures    GERD (gastroesophageal reflux disease)    Hyperlipidemia    Hypertension    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   Mild intermittent asthma    OA (osteoarthritis)    fingers    Palpitations 09/04/2022   Trigger finger of both hands    wear slints and special gloves at night   Type 2 diabetes mellitus (HCC)     Past Surgical History:  Procedure Laterality Date   BLADDER SURGERY  2012   in [place]   TURBT   CHOLECYSTECTOMY  1970   CYSTOSCOPY/RETROGRADE/URETEROSCOPY Left 05/24/2014   Procedure: CYSTOSCOPY/RETROGRADE/ URETEROSCOPY;  Surgeon: Chelsea Aus, MD;  Location: [facility];  Service: Urology;  Laterality: Left;   TRANSURETHRAL RESECTION OF BLADDER TUMOR N/A 05/24/2014   Procedure: TRANSURETHRAL RESECTION OF BLADDER TUMOR (TURBT) ;  Surgeon: Chelsea Aus, MD;  Location: [facility];  Service: Urology;  Laterality: N/A;    TRANSURETHRAL RESECTION OF BLADDER TUMOR N/A 08/04/2015   Procedure: TRANSURETHRAL RESECTION OF BLADDER TUMOR (TURBT);  Surgeon: Marcine Matar, MD;  Location[facility];  Service: Urology;  Laterality: N/A;     Current Outpatient Medications:    Accu-Chek Softclix Lancets lancets, , Disp: , Rfl:    albuterol (VENTOLIN HFA) 108 (90 Base) MCG/ACT inhaler, INHALE 2 PUFFS BY MOUTH EVERY 6 HOURS AS NEEDED FOR WHEEZING OR SHORTNESS OF BREATH, Disp: 9 g, Rfl: 1   ALPRAZolam (XANAX) 1 MG tablet, TAKE 1/2 (ONE-HALF) TABLET BY MOUTH THREE TIMES DAILY AND 3/4 (THREE-FOURTHS) TABLET AT BEDTIME, Disp: 71 tablet, Rfl: 5   azelastine (ASTELIN) 0.1 % nasal spray, Place 1 spray into both nostrils 2 (two) times daily. Use in each nostril as directed, Disp: 90 mL, Rfl: 3   B-D UF III MINI PEN NEEDLES 31G X 5 MM MISC, SMARTSIG:1 Pen Needle SUB-Q Daily, Disp: 100 each, Rfl: 3   baclofen (LIORESAL) 10 MG tablet, Take 0.5 tablets (5 mg total) by mouth 2 (two) times daily as needed for muscle spasms., Disp: 30 each, Rfl: 0   blood glucose meter kit and supplies, Dispense based on patient and insurance preference. Use up to four times daily as directed. (FOR ICD-10 E10.9, E11.9)., Disp: 1 each, Rfl: 0   Calcium Carbonate (CALCIUM 600 PO), Take 1 tablet by mouth daily  in the afternoon., Disp: , Rfl:    cholecalciferol (VITAMIN D3) 25 MCG (1000 UNIT) tablet, Take 1,000 Units by mouth daily., Disp: , Rfl:    Continuous Glucose Sensor (FREESTYLE LIBRE 2 SENSOR) MISC, Use to test blood sugar daily, Disp: 1 each, Rfl: 1   Continuous Glucose Sensor (FREESTYLE LIBRE 2 SENSOR) MISC, Place once sensor every 14 days., Disp: 6 each, Rfl: 3   diltiazem (CARDIZEM CD) 240 MG 24 hr capsule, Take 1 capsule by mouth once daily, Disp: 90 capsule, Rfl: 2   empagliflozin (JARDIANCE) 10 MG TABS tablet, Take 1 tablet (10 mg total) by mouth daily before breakfast., Disp: 90 tablet, Rfl: 1   fenofibrate 160 MG tablet, Take 1  tablet by mouth once daily, Disp: 90 tablet, Rfl: 0   furosemide (LASIX) 20 MG tablet, Take 1 tablet by mouth once daily, Disp: 90 tablet, Rfl: 0   glucose blood test strip, Use as instructed, Disp: 100 each, Rfl: 12   guaiFENesin-codeine (ROBITUSSIN AC) 100-10 MG/5ML syrup, Take 5 mLs by mouth at bedtime as needed for cough., Disp: 70 mL, Rfl: 0   LANTUS SOLOSTAR 100 UNIT/ML Solostar Pen, Inject 40 Units into the skin daily., Disp: 45 mL, Rfl: 3   levothyroxine (SYNTHROID) 25 MCG tablet, TAKE 1 TABLET BY MOUTH ONCE DAILY FOR THYROID, Disp: 90 tablet, Rfl: 3   magnesium oxide (MAG-OX) 400 MG tablet, Take 400 mg by mouth 2 (two) times daily., Disp: , Rfl:    metFORMIN (GLUCOPHAGE) 1000 MG tablet, TAKE 1 TABLET BY MOUTH TWICE DAILY WITH A MEAL, Disp: 180 tablet, Rfl: 0   metoprolol succinate (TOPROL XL) 100 MG 24 hr tablet, Take 1 tablet (100 mg total) by mouth daily. Take with or immediately following a meal., Disp: 90 tablet, Rfl: 2   Multiple Vitamin (MULTIVITAMIN WITH MINERALS) TABS tablet, Take 1 tablet by mouth daily., Disp: , Rfl:    nystatin cream (MYCOSTATIN), Apply topically., Disp: , Rfl:    Omega 3 1000 MG CAPS, Take 1,000 mg by mouth daily., Disp: , Rfl:    omeprazole (PRILOSEC) 20 MG capsule, Take 1 capsule (20 mg total) by mouth 2 (two) times daily before a meal., Disp: 180 capsule, Rfl: 1   OZEMPIC, 2 MG/DOSE, 8 MG/3ML SOPN, Inject into the skin., Disp: , Rfl:    Polyethylene Glycol 3350 (MIRALAX PO), Take by mouth every evening., Disp: , Rfl:    rosuvastatin (CRESTOR) 10 MG tablet, Take 1 tablet by mouth once daily, Disp: 90 tablet, Rfl: 0   SYMBICORT 160-4.5 MCG/ACT inhaler, Inhale 2 puffs into the lungs 2 (two) times daily., Disp: 3 each, Rfl: 3   traMADol (ULTRAM) 50 MG tablet, Take 1 tablet by mouth every 6 (six) hours as needed., Disp: , Rfl:    triamcinolone cream (KENALOG) 0.1 %, Apply topically 3 (three) times daily., Disp: , Rfl:    valsartan (DIOVAN) 80 MG tablet, Take 1  tablet (80 mg total) by mouth daily., Disp: 90 tablet, Rfl: 3   Vilazodone HCl 20 MG TABS, Take 1 tablet (20 mg total) by mouth daily., Disp: 90 tablet, Rfl: 1   VITAMIN E PO, Take by mouth., Disp: , Rfl:   Allergies  Allergen Reactions   Celebrex [Celecoxib] Anaphylaxis   Glipizide Itching   Penicillins Other (See Comments)    "Burn marks from inside out"    Sulfa Antibiotics Itching   ROS neg/noncontributory except as noted HPI/below      Objective:     LMP  (  LMP Unknown)  Wt Readings from Last 3 Encounters:  06/10/23 167 lb 8 oz (76 kg)  05/23/23 167 lb (75.8 kg)  04/30/23 167 lb (75.8 kg)    Physical Exam   Gen: WDWN NAD HEENT: NCAT, conjunctiva not injected, sclera nonicteric NECK:  supple, no thyromegaly, no nodes, no carotid bruits CARDIAC: RRR, S1S2+, no murmur. DP 2+B LUNGS: CTAB. No wheezes ABDOMEN:  BS+, soft, NTND, No HSM, no masses EXT:  no edema MSK: no gross abnormalities.  NEURO: A&O x3.  CN II-XII intact.  PSYCH: normal mood. Good eye contact     Assessment & Plan:  There are no diagnoses linked to this encounter.  No follow-ups on file.            I,Emily Lagle,acting as a Neurosurgeon for Angelena Sole, MD.,have documented all relevant documentation on the behalf of Angelena Sole, MD,as directed by  Angelena Sole, MD while in the presence of Angelena Sole, MD.  *** (refresh reminder)  I, Angelena Sole, MD, have reviewed all documentation for this visit. The documentation on 08/26/23 for the exam, diagnosis, procedures, and orders are all accurate and complete.  Larey Brick

## 2023-08-27 ENCOUNTER — Ambulatory Visit (INDEPENDENT_AMBULATORY_CARE_PROVIDER_SITE_OTHER): Payer: 59 | Admitting: Family Medicine

## 2023-08-27 ENCOUNTER — Encounter: Payer: Self-pay | Admitting: Family Medicine

## 2023-08-27 VITALS — BP 122/68 | HR 73 | Temp 98.0°F | Resp 18 | Ht 63.0 in | Wt 163.2 lb

## 2023-08-27 DIAGNOSIS — R600 Localized edema: Secondary | ICD-10-CM

## 2023-08-27 DIAGNOSIS — E1159 Type 2 diabetes mellitus with other circulatory complications: Secondary | ICD-10-CM | POA: Diagnosis not present

## 2023-08-27 DIAGNOSIS — Z7984 Long term (current) use of oral hypoglycemic drugs: Secondary | ICD-10-CM

## 2023-08-27 DIAGNOSIS — I152 Hypertension secondary to endocrine disorders: Secondary | ICD-10-CM | POA: Diagnosis not present

## 2023-08-27 DIAGNOSIS — Z794 Long term (current) use of insulin: Secondary | ICD-10-CM

## 2023-08-27 DIAGNOSIS — Z7985 Long-term (current) use of injectable non-insulin antidiabetic drugs: Secondary | ICD-10-CM

## 2023-08-27 DIAGNOSIS — E785 Hyperlipidemia, unspecified: Secondary | ICD-10-CM | POA: Diagnosis not present

## 2023-08-27 DIAGNOSIS — Z23 Encounter for immunization: Secondary | ICD-10-CM | POA: Diagnosis not present

## 2023-08-27 DIAGNOSIS — E1169 Type 2 diabetes mellitus with other specified complication: Secondary | ICD-10-CM

## 2023-08-27 DIAGNOSIS — E119 Type 2 diabetes mellitus without complications: Secondary | ICD-10-CM

## 2023-08-27 LAB — LIPID PANEL
Cholesterol: 125 mg/dL (ref 0–200)
HDL: 30.5 mg/dL — ABNORMAL LOW (ref >39.00)
LDL Cholesterol: 51 mg/dL (ref 0–99)
NonHDL: 94
Total CHOL/HDL Ratio: 4
Triglycerides: 217 mg/dL — ABNORMAL HIGH (ref 0.0–149.0)
VLDL: 43.4 mg/dL — ABNORMAL HIGH (ref 0.0–40.0)

## 2023-08-27 LAB — COMPREHENSIVE METABOLIC PANEL
ALT: 23 U/L (ref 0–35)
AST: 17 U/L (ref 0–37)
Albumin: 4.1 g/dL (ref 3.5–5.2)
Alkaline Phosphatase: 92 U/L (ref 39–117)
BUN: 18 mg/dL (ref 6–23)
CO2: 27 meq/L (ref 19–32)
Calcium: 9.7 mg/dL (ref 8.4–10.5)
Chloride: 103 meq/L (ref 96–112)
Creatinine, Ser: 0.87 mg/dL (ref 0.40–1.20)
GFR: 64.03 mL/min (ref >60.00)
Glucose, Bld: 112 mg/dL — ABNORMAL HIGH (ref 70–99)
Potassium: 3.7 meq/L (ref 3.5–5.1)
Sodium: 140 meq/L (ref 135–145)
Total Bilirubin: 0.5 mg/dL (ref 0.2–1.2)
Total Protein: 7.3 g/dL (ref 6.0–8.3)

## 2023-08-27 LAB — HEMOGLOBIN A1C: Hgb A1c MFr Bld: 7 % — ABNORMAL HIGH (ref 4.6–6.5)

## 2023-08-27 MED ORDER — FUROSEMIDE 20 MG PO TABS
20.0000 mg | ORAL_TABLET | Freq: Two times a day (BID) | ORAL | 1 refills | Status: DC
Start: 2023-08-27 — End: 2023-11-29

## 2023-08-27 MED ORDER — POTASSIUM CHLORIDE CRYS ER 10 MEQ PO TBCR
10.0000 meq | EXTENDED_RELEASE_TABLET | Freq: Two times a day (BID) | ORAL | 1 refills | Status: DC
Start: 2023-08-27 — End: 2023-11-29

## 2023-08-27 MED ORDER — OZEMPIC (2 MG/DOSE) 8 MG/3ML ~~LOC~~ SOPN: 2.0000 mg | PEN_INJECTOR | SUBCUTANEOUS | 3 refills | Status: DC

## 2023-08-27 NOTE — Patient Instructions (Addendum)
Decrease Lantus to 36 units.  Send copy eye exam  Psychiatrist Medicine at [facility]  [address] on the 1st floor Phone number [phone]

## 2023-08-27 NOTE — Assessment & Plan Note (Signed)
Chronic.  Not controlled.  Doing better on diet.  Increase exercise. Continue ozempic 2mg  weekly, metformin 1000mg  bid, jardiance 10mg ,  lantus 36 units daily(decrease d/t lows).  Monitor and let us know.  Has appt for eye exam 09/02/23

## 2023-08-27 NOTE — Assessment & Plan Note (Signed)
Chronic.  Well controlled.  Pt doing lasix 20mg  bid.  Start K daily(possibly bid-await K)

## 2023-08-27 NOTE — Assessment & Plan Note (Signed)
Chronic.  Not ideal.  Continue crestor 10mg  and fenofibrate 160mg  and get sugars controlled.

## 2023-08-27 NOTE — Assessment & Plan Note (Signed)
Chronic.  Well controlled.  Continue valsartan 80mg .  Continue cartia 240mg  and metoprolol -call card about bradycardia

## 2023-09-01 NOTE — Progress Notes (Signed)
Great!  Sugars have improved a lot.  Continue same regimen.  Keep up the good work.  Bad cholesterol is at goal-continue same meds.  Triglycerides are still little bit elevated-continue working on diet and exercise.

## 2023-09-04 ENCOUNTER — Other Ambulatory Visit: Payer: Self-pay | Admitting: Family Medicine

## 2023-09-16 DIAGNOSIS — Z8551 Personal history of malignant neoplasm of bladder: Secondary | ICD-10-CM | POA: Diagnosis not present

## 2023-09-16 DIAGNOSIS — N3000 Acute cystitis without hematuria: Secondary | ICD-10-CM | POA: Diagnosis not present

## 2023-09-23 ENCOUNTER — Other Ambulatory Visit: Payer: Self-pay | Admitting: Family Medicine

## 2023-09-30 ENCOUNTER — Encounter: Payer: Self-pay | Admitting: Family Medicine

## 2023-09-30 ENCOUNTER — Ambulatory Visit (INDEPENDENT_AMBULATORY_CARE_PROVIDER_SITE_OTHER): Payer: 59 | Admitting: Family Medicine

## 2023-09-30 VITALS — BP 108/61 | HR 69 | Temp 97.1°F | Ht 63.0 in | Wt 161.6 lb

## 2023-09-30 DIAGNOSIS — R059 Cough, unspecified: Secondary | ICD-10-CM

## 2023-09-30 DIAGNOSIS — J440 Chronic obstructive pulmonary disease with acute lower respiratory infection: Secondary | ICD-10-CM

## 2023-09-30 DIAGNOSIS — J209 Acute bronchitis, unspecified: Secondary | ICD-10-CM

## 2023-09-30 LAB — POCT INFLUENZA A/B
Influenza A, POC: NEGATIVE
Influenza B, POC: NEGATIVE

## 2023-09-30 LAB — POC COVID19 BINAXNOW: SARS Coronavirus 2 Ag: NEGATIVE

## 2023-09-30 MED ORDER — AZITHROMYCIN 250 MG PO TABS: ORAL_TABLET | ORAL | 0 refills | Status: AC

## 2023-09-30 MED ORDER — GUAIFENESIN-CODEINE 100-10 MG/5ML PO SYRP: 5.0000 mL | ORAL_SOLUTION | Freq: Every evening | ORAL | 0 refills | Status: DC | PRN

## 2023-09-30 NOTE — Progress Notes (Signed)
Subjective:    Patient ID: Teresa Rice, female    DOB: [birth year], 77 y.o.   MRN: [id number]  Chief Complaint  Patient presents with   Cough    Pt c/o of congestion w/coughing. Pt wakes up in the morning with stuffy nose. Pt has not taken a COVID or Flu test. Pt has had sxs for over 5 days, no fever. Has not taken any OTC meds.   HPI Cough & Congestion - Pt complains of congestion and an ongoing cough for 5 days. States on Friday night her symptoms worsened. Denies fever or sore throat. Endorses sinus tenderness, especially around her nose. No sob  has copd-using inhalers but not needing albuterol more.  PDMP Checked.   Health Maintenance Due  Topic Date Due   Medicare Annual Wellness (AWV)  12/15/2021   OPHTHALMOLOGY EXAM  02/22/2023    Past Medical History:  Diagnosis Date   Bladder cancer [facility]) first dx 2012   Recurrent Bladder Cancer--  (urologist-  dr Retta Diones)   Chronic constipation    Closed fracture of left distal radius 08/30/2016   Full dentures    GERD (gastroesophageal reflux disease)    Hyperlipidemia    Hypertension    Inappropriate sinus tachycardia (HCC) 01/07/2023   Mild intermittent asthma    OA (osteoarthritis)    fingers    Palpitations 09/04/2022   Trigger finger of both hands    wear slints and special gloves at night   Type 2 diabetes mellitus [facility])     Past Surgical History:  Procedure Laterality Date   BLADDER SURGERY  2012   in [place]   TURBT   CHOLECYSTECTOMY  11/26/1968   CYSTOSCOPY/RETROGRADE/URETEROSCOPY Left 05/24/2014   Procedure: CYSTOSCOPY/RETROGRADE/ URETEROSCOPY;  Surgeon: Chelsea Aus, MD;  Location: [facility];  Service: Urology;  Laterality: Left;   TRANSURETHRAL RESECTION OF BLADDER TUMOR N/A 05/24/2014   Procedure: TRANSURETHRAL RESECTION OF BLADDER TUMOR (TURBT) ;  Surgeon: Chelsea Aus, MD;  Location: [facility];  Service: Urology;  Laterality: N/A;   TRANSURETHRAL RESECTION OF BLADDER TUMOR N/A 08/04/2015    Procedure: TRANSURETHRAL RESECTION OF BLADDER TUMOR (TURBT);  Surgeon: Marcine Matar, MD;  Location: [facility];  Service: Urology;  Laterality: N/A;   WRIST SURGERY Right    x3     Current Outpatient Medications:    Accu-Chek Softclix Lancets lancets, , Disp: , Rfl:    albuterol (VENTOLIN HFA) 108 (90 Base) MCG/ACT inhaler, INHALE 2 PUFFS BY MOUTH EVERY 6 HOURS AS NEEDED FOR WHEEZING OR SHORTNESS OF BREATH, Disp: 9 g, Rfl: 1   ALPRAZolam (XANAX) 1 MG tablet, TAKE 1/2 (ONE-HALF) TABLET BY MOUTH THREE TIMES DAILY AND 3/4 (THREE-FOURTHS) TABLET AT BEDTIME, Disp: 71 tablet, Rfl: 5   azelastine (ASTELIN) 0.1 % nasal spray, Place 1 spray into both nostrils 2 (two) times daily. Use in each nostril as directed, Disp: 90 mL, Rfl: 3   azithromycin (ZITHROMAX) 250 MG tablet, Take 2 tablets on day 1, then 1 tablet daily on days 2 through 5, Disp: 6 tablet, Rfl: 0   B-D UF III MINI PEN NEEDLES 31G X 5 MM MISC, SMARTSIG:1 Pen Needle SUB-Q Daily, Disp: 100 each, Rfl: 3   baclofen (LIORESAL) 10 MG tablet, Take 0.5 tablets (5 mg total) by mouth 2 (two) times daily as needed for muscle spasms., Disp: 30 each, Rfl: 0   blood glucose meter kit and supplies, Dispense based on patient and insurance preference. Use up to four times  daily as directed. (FOR ICD-10 E10.9, E11.9)., Disp: 1 each, Rfl: 0   Calcium Carbonate (CALCIUM 600 PO), Take 1 tablet by mouth daily in the afternoon., Disp: , Rfl:    cholecalciferol (VITAMIN D3) 25 MCG (1000 UNIT) tablet, Take 1,000 Units by mouth daily., Disp: , Rfl:    Continuous Glucose Sensor (FREESTYLE LIBRE 2 SENSOR) MISC, Use to test blood sugar daily, Disp: 1 each, Rfl: 1   Continuous Glucose Sensor (FREESTYLE LIBRE 2 SENSOR) MISC, Place once sensor every 14 days., Disp: 6 each, Rfl: 3   diltiazem (CARDIZEM CD) 240 MG 24 hr capsule, Take 1 capsule by mouth once daily, Disp: 90 capsule, Rfl: 2   empagliflozin (JARDIANCE) 10 MG TABS tablet, Take 1 tablet (10  mg total) by mouth daily before breakfast., Disp: 90 tablet, Rfl: 1   fenofibrate 160 MG tablet, Take 1 tablet by mouth once daily, Disp: 90 tablet, Rfl: 0   furosemide (LASIX) 20 MG tablet, Take 1 tablet (20 mg total) by mouth 2 (two) times daily., Disp: 180 tablet, Rfl: 1   glucose blood test strip, Use as instructed, Disp: 100 each, Rfl: 12   guaiFENesin-codeine (ROBITUSSIN AC) 100-10 MG/5ML syrup, Take 5 mLs by mouth at bedtime as needed for cough., Disp: 70 mL, Rfl: 0   guaiFENesin-codeine (ROBITUSSIN AC) 100-10 MG/5ML syrup, Take 5 mLs by mouth at bedtime as needed for cough., Disp: 70 mL, Rfl: 0   LANTUS SOLOSTAR 100 UNIT/ML Solostar Pen, Inject 40 Units into the skin daily., Disp: 45 mL, Rfl: 3   levothyroxine (SYNTHROID) 25 MCG tablet, TAKE 1 TABLET BY MOUTH ONCE DAILY FOR THYROID, Disp: 90 tablet, Rfl: 3   magnesium oxide (MAG-OX) 400 MG tablet, Take 400 mg by mouth 2 (two) times daily., Disp: , Rfl:    metFORMIN (GLUCOPHAGE) 1000 MG tablet, TAKE 1 TABLET BY MOUTH TWICE DAILY WITH A MEAL, Disp: 180 tablet, Rfl: 0   metoprolol succinate (TOPROL XL) 100 MG 24 hr tablet, Take 1 tablet (100 mg total) by mouth daily. Take with or immediately following a meal., Disp: 90 tablet, Rfl: 2   Multiple Vitamin (MULTIVITAMIN WITH MINERALS) TABS tablet, Take 1 tablet by mouth daily., Disp: , Rfl:    nystatin cream (MYCOSTATIN), Apply topically., Disp: , Rfl:    Omega 3 1000 MG CAPS, Take 1,000 mg by mouth daily., Disp: , Rfl:    omeprazole (PRILOSEC) 20 MG capsule, Take 1 capsule (20 mg total) by mouth 2 (two) times daily before a meal., Disp: 180 capsule, Rfl: 1   OZEMPIC, 2 MG/DOSE, 8 MG/3ML SOPN, Inject 2 mg into the skin once a week., Disp: 9 mL, Rfl: 3   Polyethylene Glycol 3350 (MIRALAX PO), Take by mouth every evening., Disp: , Rfl:    potassium chloride (KLOR-CON M) 10 MEQ tablet, Take 1 tablet (10 mEq total) by mouth 2 (two) times daily., Disp: 180 tablet, Rfl: 1   rosuvastatin (CRESTOR) 10 MG  tablet, Take 1 tablet by mouth once daily, Disp: 90 tablet, Rfl: 0   SYMBICORT 160-4.5 MCG/ACT inhaler, Inhale 2 puffs into the lungs 2 (two) times daily., Disp: 3 each, Rfl: 3   traMADol (ULTRAM) 50 MG tablet, Take 1 tablet by mouth every 6 (six) hours as needed., Disp: , Rfl:    triamcinolone cream (KENALOG) 0.1 %, Apply topically 3 (three) times daily., Disp: , Rfl:    valsartan (DIOVAN) 80 MG tablet, Take 1 tablet (80 mg total) by mouth daily., Disp: 90 tablet, Rfl: 3  Vilazodone HCl 20 MG TABS, Take 1 tablet (20 mg total) by mouth daily., Disp: 90 tablet, Rfl: 1   VITAMIN E PO, Take by mouth., Disp: , Rfl:   Allergies  Allergen Reactions   Celebrex [Celecoxib] Anaphylaxis   Glipizide Itching   Penicillins Other (See Comments)    "Burn marks from inside out"    Sulfa Antibiotics Itching   ROS neg/noncontributory except as noted HPI/below      Objective:     BP 108/61   Pulse 69   Temp (!) 97.1 F (36.2 C)   Ht 5\' 3"  (1.6 m)   Wt 161 lb 9.6 oz (73.3 kg)   LMP  (LMP Unknown)   SpO2 98%   BMI 28.63 kg/m  Wt Readings from Last 3 Encounters:  09/30/23 161 lb 9.6 oz (73.3 kg)  08/27/23 163 lb 4 oz (74 kg)  06/10/23 167 lb 8 oz (76 kg)    Physical Exam   Gen: WDWN NAD HEENT: NCAT, conjunctiva not injected, sclera nonicteric TM WNL B, OP moist, no exudates, slight tremor, sinuses tender NECK:  supple, no thyromegaly, no nodes, no carotid bruits CARDIAC: RRR, S1S2+, no murmur.  LUNGS: CTAB. No wheezes EXT:  no edema MSK: no gross abnormalities.  NEURO: A&O x3.  CN II-XII intact.  PSYCH: normal mood. Good eye contact  Results for orders placed or performed in visit on 09/30/23  POC COVID-19 BinaxNow  Result Value Ref Range   SARS Coronavirus 2 Ag Negative Negative  POCT Influenza A/B  Result Value Ref Range   Influenza A, POC Negative Negative   Influenza B, POC Negative Negative      Assessment & Plan:  Cough, unspecified type -     POC COVID-19 BinaxNow -      POCT Influenza A/B  Acute bronchitis with COPD (HCC)  Other orders -     Azithromycin; Take 2 tablets on day 1, then 1 tablet daily on days 2 through 5  Dispense: 6 tablet; Refill: 0 -     guaiFENesin-Codeine; Take 5 mLs by mouth at bedtime as needed for cough.  Dispense: 70 mL; Refill: 0  1  bronchitis/URI.  Copd.  Will do codeine cough sytup per pt request(helps).  Zpk.  If worsening, copd flares, will add prednisone 40mg  daily x 5 days  Return if symptoms worsen or fail to improve.  Germaine Pomfret Rice,acting as a scribe for Angelena Sole, MD.,have documented all relevant documentation on the behalf of Angelena Sole, MD,as directed by  Angelena Sole, MD while in the presence of Angelena Sole, MD.  I, Angelena Sole, MD, have reviewed all documentation for this visit. The documentation on 09/30/23 for the exam, diagnosis, procedures, and orders are all accurate and complete.   Angelena Sole, MD

## 2023-09-30 NOTE — Patient Instructions (Signed)
It was very nice to see you today!  Meds sent to pharmacy.  If getting worse, let me know and will send in steroids.   PLEASE NOTE:  If you had any lab tests please let us know if you have not heard back within a few days. You may see your results on MyChart before we have a chance to review them but we will give you a call once they are reviewed by [place]. If we ordered any referrals today, please let us know if you have not heard from their office within the next week.   Please try these tips to maintain a healthy lifestyle:  Eat most of your calories during the day when you are active. Eliminate processed foods including packaged sweets (pies, cakes, cookies), reduce intake of potatoes, white bread, white pasta, and white rice. Look for whole grain options, oat flour or almond flour.  Each meal should contain half fruits/vegetables, one quarter protein, and one quarter carbs (no bigger than a computer mouse).  Cut down on sweet beverages. This includes juice, soda, and sweet tea. Also watch fruit intake, though this is a healthier sweet option, it still contains natural sugar! Limit to 3 servings daily.  Drink at least 1 glass of water with each meal and aim for at least 8 glasses per day  Exercise at least 150 minutes every week.

## 2023-10-08 ENCOUNTER — Other Ambulatory Visit: Payer: Self-pay | Admitting: Family Medicine

## 2023-10-11 ENCOUNTER — Other Ambulatory Visit: Payer: Self-pay | Admitting: Family Medicine

## 2023-10-18 DIAGNOSIS — H0102A Squamous blepharitis right eye, upper and lower eyelids: Secondary | ICD-10-CM | POA: Diagnosis not present

## 2023-10-18 DIAGNOSIS — H1045 Other chronic allergic conjunctivitis: Secondary | ICD-10-CM | POA: Diagnosis not present

## 2023-10-18 DIAGNOSIS — H524 Presbyopia: Secondary | ICD-10-CM | POA: Diagnosis not present

## 2023-10-18 DIAGNOSIS — E119 Type 2 diabetes mellitus without complications: Secondary | ICD-10-CM | POA: Diagnosis not present

## 2023-10-18 DIAGNOSIS — H35033 Hypertensive retinopathy, bilateral: Secondary | ICD-10-CM | POA: Diagnosis not present

## 2023-10-18 DIAGNOSIS — H04123 Dry eye syndrome of bilateral lacrimal glands: Secondary | ICD-10-CM | POA: Diagnosis not present

## 2023-10-18 DIAGNOSIS — H0102B Squamous blepharitis left eye, upper and lower eyelids: Secondary | ICD-10-CM | POA: Diagnosis not present

## 2023-10-18 LAB — HM DIABETES EYE EXAM

## 2023-10-19 ENCOUNTER — Other Ambulatory Visit: Payer: Self-pay | Admitting: Family Medicine

## 2023-10-19 DIAGNOSIS — K219 Gastro-esophageal reflux disease without esophagitis: Secondary | ICD-10-CM

## 2023-10-20 ENCOUNTER — Other Ambulatory Visit: Payer: Self-pay | Admitting: Family Medicine

## 2023-11-19 ENCOUNTER — Ambulatory Visit (INDEPENDENT_AMBULATORY_CARE_PROVIDER_SITE_OTHER): Payer: 59

## 2023-11-19 VITALS — Wt 161.0 lb

## 2023-11-19 DIAGNOSIS — Z0000 Encounter for general adult medical examination without abnormal findings: Secondary | ICD-10-CM | POA: Diagnosis not present

## 2023-11-19 NOTE — Progress Notes (Addendum)
Subjective:   Teresa Rice is a 77 y.o. female who presents for Medicare Annual (Subsequent) preventive examination.  Visit Complete: Virtual I connected with  Susa Griffins on 11/19/23 by a audio enabled telemedicine application and verified that I am speaking with the correct person using two identifiers.  Patient Location: Home  Provider Location: Office/Clinic  I discussed the limitations of evaluation and management by telemedicine. The patient expressed understanding and agreed to proceed.  Vital Signs: Because this visit was a virtual/telehealth visit, some criteria may be missing or patient reported. Any vitals not documented were not able to be obtained and vitals that have been documented are patient reported.  Cardiac Risk Factors include: advanced age (>54men, >37 women);diabetes mellitus;dyslipidemia;hypertension     Objective:    Today's Vitals   11/19/23 1102  Weight: 161 lb (73 kg)   Body mass index is 28.52 kg/m.     11/19/2023   11:03 AM 05/31/2022   10:11 AM 04/11/2021    5:39 PM 12/15/2020   11:08 AM 08/21/2019    8:41 AM 07/30/2019    1:16 PM 06/26/2019   10:27 AM  Advanced Directives  Does Patient Have a Medical Advance Directive? No No No No No No No  Would patient like information on creating a medical advance directive? No - Patient declined No - Patient declined  No - Patient declined Yes (MAU/Ambulatory/Procedural Areas - Information given)      Current Medications (verified) Outpatient Encounter Medications as of 11/19/2023  Medication Sig   Accu-Chek Softclix Lancets lancets    albuterol (VENTOLIN HFA) 108 (90 Base) MCG/ACT inhaler INHALE 2 PUFFS BY MOUTH EVERY 6 HOURS AS NEEDED FOR WHEEZING OR SHORTNESS OF BREATH   ALPRAZolam (XANAX) 1 MG tablet TAKE 1/2 (ONE-HALF) TABLET BY MOUTH THREE TIMES DAILY AND 3/4 (THREE-FOURTHS) TABLET AT BEDTIME   B-D UF III MINI PEN NEEDLES 31G X 5 MM MISC SMARTSIG:1 Pen Needle SUB-Q Daily   baclofen (LIORESAL)  10 MG tablet Take 0.5 tablets (5 mg total) by mouth 2 (two) times daily as needed for muscle spasms.   blood glucose meter kit and supplies Dispense based on patient and insurance preference. Use up to four times daily as directed. (FOR ICD-10 E10.9, E11.9).   Calcium Carbonate (CALCIUM 600 PO) Take 1 tablet by mouth daily in the afternoon.   cholecalciferol (VITAMIN D3) 25 MCG (1000 UNIT) tablet Take 1,000 Units by mouth daily.   Continuous Glucose Sensor (FREESTYLE LIBRE 2 SENSOR) MISC Use to test blood sugar daily   Continuous Glucose Sensor (FREESTYLE LIBRE 2 SENSOR) MISC Place once sensor every 14 days.   diltiazem (CARDIZEM CD) 240 MG 24 hr capsule Take 1 capsule by mouth once daily   empagliflozin (JARDIANCE) 10 MG TABS tablet Take 1 tablet (10 mg total) by mouth daily before breakfast.   fenofibrate 160 MG tablet Take 1 tablet by mouth once daily   fluticasone (FLONASE) 50 MCG/ACT nasal spray Place into both nostrils daily.   furosemide (LASIX) 20 MG tablet Take 1 tablet (20 mg total) by mouth 2 (two) times daily.   glucose blood test strip Use as instructed   guaiFENesin-codeine (ROBITUSSIN AC) 100-10 MG/5ML syrup Take 5 mLs by mouth at bedtime as needed for cough.   LANTUS SOLOSTAR 100 UNIT/ML Solostar Pen Inject 40 Units into the skin daily.   levothyroxine (SYNTHROID) 25 MCG tablet TAKE 1 TABLET BY MOUTH ONCE DAILY FOR THYROID   magnesium oxide (MAG-OX) 400 MG tablet Take  400 mg by mouth 2 (two) times daily.   metFORMIN (GLUCOPHAGE) 1000 MG tablet TAKE 1 TABLET BY MOUTH TWICE DAILY WITH A MEAL   metoprolol succinate (TOPROL XL) 100 MG 24 hr tablet Take 1 tablet (100 mg total) by mouth daily. Take with or immediately following a meal.   Multiple Vitamin (MULTIVITAMIN WITH MINERALS) TABS tablet Take 1 tablet by mouth daily.   nystatin cream (MYCOSTATIN) Apply topically.   Omega 3 1000 MG CAPS Take 1,000 mg by mouth daily.   omeprazole (PRILOSEC) 20 MG capsule TAKE 1 CAPSULE BY MOUTH  TWICE DAILY BEFORE A MEAL   OZEMPIC, 2 MG/DOSE, 8 MG/3ML SOPN Inject 2 mg into the skin once a week.   Polyethylene Glycol 3350 (MIRALAX PO) Take by mouth every evening.   potassium chloride (KLOR-CON M) 10 MEQ tablet Take 1 tablet (10 mEq total) by mouth 2 (two) times daily.   rosuvastatin (CRESTOR) 10 MG tablet Take 1 tablet by mouth once daily   SYMBICORT 160-4.5 MCG/ACT inhaler INHALE 2 PUFFS INTO LUNGS TWICE DAILY   traMADol (ULTRAM) 50 MG tablet Take 1 tablet by mouth every 6 (six) hours as needed.   triamcinolone cream (KENALOG) 0.1 % Apply topically 3 (three) times daily.   valsartan (DIOVAN) 80 MG tablet Take 1 tablet (80 mg total) by mouth daily.   Vilazodone HCl 20 MG TABS Take 1 tablet (20 mg total) by mouth daily.   VITAMIN E PO Take by mouth.   [DISCONTINUED] azelastine (ASTELIN) 0.1 % nasal spray Place 1 spray into both nostrils 2 (two) times daily. Use in each nostril as directed   [DISCONTINUED] guaiFENesin-codeine (ROBITUSSIN AC) 100-10 MG/5ML syrup Take 5 mLs by mouth at bedtime as needed for cough.   No facility-administered encounter medications on file as of 11/19/2023.    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   Mild intermittent asthma    OA (osteoarthritis)    fingers    Palpitations 09/04/2022   Trigger finger of both hands    wear slints and special gloves at night   Type 2 diabetes mellitus [facility])    Past Surgical History:  Procedure Laterality Date   BLADDER SURGERY  2012   in [place]   TURBT   CHOLECYSTECTOMY  11/26/1968    CYSTOSCOPY/RETROGRADE/URETEROSCOPY Left 05/24/2014   Procedure: CYSTOSCOPY/RETROGRADE/ URETEROSCOPY;  Surgeon: Chelsea Aus, MD;  Location: [facility];  Service: Urology;  Laterality: Left;   TRANSURETHRAL RESECTION OF BLADDER TUMOR N/A 05/24/2014   Procedure: TRANSURETHRAL RESECTION OF BLADDER TUMOR (TURBT) ;  Surgeon: Chelsea Aus, MD;  Location: [facility];  Service: Urology;  Laterality: N/A;   TRANSURETHRAL RESECTION OF BLADDER TUMOR N/A 08/04/2015   Procedure: TRANSURETHRAL RESECTION OF BLADDER TUMOR (TURBT);  Surgeon: Marcine Matar, MD;  Location: [facility];  Service: Urology;  Laterality: N/A;   WRIST SURGERY Right    x3   Family History  Problem Relation Age of Onset   Early death Mother    AAA (abdominal aortic aneurysm) Sister    Cancer Father    Diabetes Brother  Cancer Brother    Social History   Socioeconomic History   Marital status: Widowed    Spouse name: Not on file   Number of children: 1   Years of education: 77   Highest education level: Not on file  Occupational History   Occupation: retired  Tobacco Use   Smoking status: Former    Current packs/day: 0.00    Types: Cigarettes    Start date: 05/21/1964    Quit date: 05/21/1994    Years since quitting: 29.5   Smokeless tobacco: Never  Vaping Use   Vaping status: Never Used  Substance and Sexual Activity   Alcohol use: Yes    Comment: RARE   Drug use: No   Sexual activity: Not on file  Other Topics Concern   Not on file  Social History Narrative   Lives in an apartment, lives alone   One daughter   Right handed    Retired from [employer] work   Highest level of education:  GED   Social Drivers of Corporate investment banker Strain: Low Risk  (11/19/2023)   Overall Financial Resource Strain (CARDIA)    Difficulty of Paying Living Expenses: Not hard at all  Food Insecurity: No Food Insecurity (11/19/2023)   Hunger Vital Sign    Worried About Running Out of Food in the  Last Year: Never true    Ran Out of Food in the Last Year: Never true  Transportation Needs: No Transportation Needs (11/19/2023)   PRAPARE - Administrator, Civil Service (Medical): No    Lack of Transportation (Non-Medical): No  Physical Activity: Inactive (11/19/2023)   Exercise Vital Sign    Days of Exercise per Week: 0 days    Minutes of Exercise per Session: 0 min  Stress: No Stress Concern Present (11/19/2023)   [employer] of Occupational Health - Occupational Stress Questionnaire    Feeling of Stress : Not at all  Social Connections: Moderately Isolated (11/19/2023)   Social Connection and Isolation Panel [NHANES]    Frequency of Communication with Friends and Family: More than three times a week    Frequency of Social Gatherings with Friends and Family: More than three times a week    Attends Religious Services: More than 4 times per year    Active Member of [employer] or Organizations: No    Attends Banker Meetings: Never    Marital Status: Widowed    Tobacco Counseling Counseling given: Not Answered   Clinical Intake:  Pre-visit preparation completed: Yes  Pain : No/denies pain     BMI - recorded: 28.52 Nutritional Status: BMI 25 -29 Overweight Nutritional Risks: None Diabetes: Yes CBG done?: Yes (128 after breakfast per pt) CBG resulted in Enter/ Edit results?: No Did pt. bring in CBG monitor from home?: No  How often do you need to have someone help you when you read instructions, pamphlets, or other written materials from your doctor or pharmacy?: 1 - Never  Interpreter Needed?: No  Information entered by :: Lanier Ensign, LPN   Activities of Daily Living    11/19/2023   11:04 AM  In your present state of health, do you have any difficulty performing the following activities:  Hearing? 1  Vision? 0  Difficulty concentrating or making decisions? 0  Walking or climbing stairs? 0  Dressing or bathing? 0  Doing errands,  shopping? 0  Preparing Food and eating ? N  Using the Toilet? N  In the  past six months, have you accidently leaked urine? N  Do you have problems with loss of bowel control? N  Managing your Medications? N  Managing your Finances? N  Housekeeping or managing your Housekeeping? N    Patient Care Team: Jeani Sow, MD as PCP - General (Family Medicine) Chilton Si, MD as PCP - Cardiology (Cardiology) Glendale Chard, DO as Consulting Physician (Neurology) Royden Purl, AUD (Audiology) Cottle, Steva Ready., MD as Attending Physician (Psychiatry) Marcine Matar, MD as Consulting Physician (Urology)  Indicate any recent Medical Services you may have received from other than [facility] providers in the past year (date may be approximate).     Assessment:   This is a routine wellness examination for Tiffanyann.  Hearing/Vision screen Hearing Screening - Comments:: Pt wears hearing aids  Vision Screening - Comments:: Pt follows up with Dr Dione Booze for annual eye exams    Goals Addressed             This Visit's Progress    Patient Stated       Get blood sugar down        Depression Screen    11/19/2023   11:08 AM 08/27/2023   10:03 AM 04/19/2023    3:17 PM 01/09/2023    3:52 PM 11/15/2022   10:02 AM 07/12/2022    8:56 AM 02/26/2022    4:06 PM  PHQ 2/9 Scores  PHQ - 2 Score 0 0 0 0 0 0 0  PHQ- 9 Score  0 0 0 2 3 2     Fall Risk    11/19/2023   11:12 AM 04/19/2023    2:08 PM 01/09/2023    3:52 PM 07/12/2022    8:56 AM 02/06/2022    9:43 AM  Fall Risk   Falls in the past year? 0 0 0 0 0  Number falls in past yr: 0 0 0 0 0  Injury with Fall? 0 0 0 0 0  Risk for fall due to : No Fall Risks  No Fall Risks No Fall Risks No Fall Risks  Follow up Falls prevention discussed Falls evaluation completed Falls evaluation completed Falls evaluation completed Falls evaluation completed    MEDICARE RISK AT HOME: Medicare Risk at Home Any stairs in or around the home?: No If  so, are there any without handrails?: No Home free of loose throw rugs in walkways, pet beds, electrical cords, etc?: Yes Adequate lighting in your home to reduce risk of falls?: Yes Life alert?: No Use of a cane, walker or w/c?: No Grab bars in the bathroom?: Yes Shower chair or bench in shower?: No Elevated toilet seat or a handicapped toilet?: No  TIMED UP AND GO:  Was the test performed?  No    Cognitive Function:        11/19/2023   11:18 AM 12/15/2020   11:14 AM  6CIT Screen  What Year? 0 points 0 points  What month? 0 points 0 points  What time? 0 points   Count back from 20 0 points 0 points  Months in reverse 0 points 0 points  Repeat phrase 0 points 4 points  Total Score 0 points     Immunizations Immunization History  Administered Date(s) Administered   Fluad Quad(high Dose 65+) 08/12/2019, 08/10/2020, 08/14/2021, 11/15/2022   Fluad Trivalent(High Dose 65+) 08/27/2023   PFIZER(Purple Top)SARS-COV-2 Vaccination 08/17/2020, 09/09/2020   PNEUMOCOCCAL CONJUGATE-20 10/11/2021   Pneumococcal Conjugate-13 07/09/2019   Pneumococcal Polysaccharide-23 08/10/2020   Tdap  02/08/2022    TDAP status: Up to date  Flu Vaccine status: Up to date  Pneumococcal vaccine status: Up to date  Covid-19 vaccine status: Completed vaccines  Qualifies for Shingles Vaccine? Yes   Zostavax completed No   Shingrix Completed?: No.    Education has been provided regarding the importance of this vaccine. Patient has been advised to call insurance company to determine out of pocket expense if they have not yet received this vaccine. Advised may also receive vaccine at local pharmacy or Health Dept. Verbalized acceptance and understanding.  Screening Tests Health Maintenance  Topic Date Due   Zoster Vaccines- Shingrix (1 of 2) Never done   DEXA SCAN  10/15/2023   Diabetic kidney evaluation - Urine ACR  02/14/2024   HEMOGLOBIN A1C  02/25/2024   Diabetic kidney evaluation - eGFR  measurement  08/26/2024   FOOT EXAM  08/26/2024   OPHTHALMOLOGY EXAM  10/17/2024   Medicare Annual Wellness (AWV)  11/18/2024   DTaP/Tdap/Td (2 - Td or Tdap) 02/09/2032   Pneumonia Vaccine 35+ Years old  Completed   INFLUENZA VACCINE  Completed   Hepatitis C Screening  Completed   HPV VACCINES  Aged Out   COVID-19 Vaccine  Discontinued    Health Maintenance  Health Maintenance Due  Topic Date Due   Zoster Vaccines- Shingrix (1 of 2) Never done   DEXA SCAN  10/15/2023    Colorectal cancer screening: No longer required.   Declined further mammograms and bone scans     Additional Screening:  Hepatitis C Screening: Completed 10/14/2019  Vision Screening: Recommended annual ophthalmology exams for early detection of glaucoma and other disorders of the eye. Is the patient up to date with their annual eye exam?  Yes  Who is the provider or what is the name of the office in which the patient attends annual eye exams? Dr Dione Booze  If pt is not established with a provider, would they like to be referred to a provider to establish care? No .   Dental Screening: Recommended annual dental exams for proper oral hygiene  Diabetic Foot Exam: Diabetic Foot Exam: Completed 08/27/23  Community Resource Referral / Chronic Care Management: CRR required this visit?  No   CCM required this visit?  No     Plan:     I have personally reviewed and noted the following in the patient's chart:   Medical and social history Use of alcohol, tobacco or illicit drugs  Current medications and supplements including opioid prescriptions. Patient is currently taking opioid prescriptions. Information provided to patient regarding non-opioid alternatives. Patient advised to discuss non-opioid treatment plan with their provider. Functional ability and status Nutritional status Physical activity Advanced directives List of other physicians Hospitalizations, surgeries, and ER visits in previous 12  months Vitals Screenings to include cognitive, depression, and falls Referrals and appointments  In addition, I have reviewed and discussed with patient certain preventive protocols, quality metrics, and best practice recommendations. A written personalized care plan for preventive services as well as general preventive health recommendations were provided to patient.     Marzella Schlein, LPN   [id number]/08/[id number]   After Visit Summary: (MyChart) Due to this being a telephonic visit, the after visit summary with patients personalized plan was offered to patient via MyChart   Nurse Notes: none

## 2023-11-19 NOTE — Patient Instructions (Addendum)
Teresa Rice , Thank you for taking time to come for your Medicare Wellness Visit. I appreciate your ongoing commitment to your health goals. Please review the following plan we discussed and let me know if I can assist you in the future.   Referrals/Orders/Follow-Ups/Clinician Recommendations: Aim for 30 minutes of exercise or brisk walking, 6-8 glasses of water, and 5 servings of fruits and vegetables each day.   This is a list of the screening recommended for you and due dates:  Health Maintenance  Topic Date Due   Zoster (Shingles) Vaccine (1 of 2) Never done   DEXA scan (bone density measurement)  10/15/2023   Yearly kidney health urinalysis for diabetes  02/14/2024   Hemoglobin A1C  02/25/2024   Yearly kidney function blood test for diabetes  08/26/2024   Complete foot exam   08/26/2024   Eye exam for diabetics  10/17/2024   Medicare Annual Wellness Visit  11/18/2024   DTaP/Tdap/Td vaccine (2 - Td or Tdap) 02/09/2032   Pneumonia Vaccine  Completed   Flu Shot  Completed   Hepatitis C Screening  Completed   HPV Vaccine  Aged Out   COVID-19 Vaccine  Discontinued    Advanced directives: (Declined) Advance directive discussed with you today. Even though you declined this today, please call our office should you change your mind, and we can give you the proper paperwork for you to fill out.  Next Medicare Annual Wellness Visit scheduled for next year: Yes

## 2023-11-27 ENCOUNTER — Other Ambulatory Visit: Payer: Self-pay | Admitting: Family Medicine

## 2023-11-29 ENCOUNTER — Encounter: Payer: Self-pay | Admitting: Family Medicine

## 2023-11-29 ENCOUNTER — Ambulatory Visit (INDEPENDENT_AMBULATORY_CARE_PROVIDER_SITE_OTHER): Payer: 59 | Admitting: Family Medicine

## 2023-11-29 VITALS — BP 118/67 | HR 91 | Temp 97.1°F | Resp 18 | Ht 63.0 in | Wt 164.2 lb

## 2023-11-29 DIAGNOSIS — E1169 Type 2 diabetes mellitus with other specified complication: Secondary | ICD-10-CM

## 2023-11-29 DIAGNOSIS — E119 Type 2 diabetes mellitus without complications: Secondary | ICD-10-CM

## 2023-11-29 DIAGNOSIS — J302 Other seasonal allergic rhinitis: Secondary | ICD-10-CM | POA: Diagnosis not present

## 2023-11-29 DIAGNOSIS — I152 Hypertension secondary to endocrine disorders: Secondary | ICD-10-CM | POA: Diagnosis not present

## 2023-11-29 DIAGNOSIS — E1159 Type 2 diabetes mellitus with other circulatory complications: Secondary | ICD-10-CM

## 2023-11-29 DIAGNOSIS — E2839 Other primary ovarian failure: Secondary | ICD-10-CM

## 2023-11-29 DIAGNOSIS — Z7984 Long term (current) use of oral hypoglycemic drugs: Secondary | ICD-10-CM | POA: Diagnosis not present

## 2023-11-29 DIAGNOSIS — K219 Gastro-esophageal reflux disease without esophagitis: Secondary | ICD-10-CM | POA: Diagnosis not present

## 2023-11-29 DIAGNOSIS — Z7985 Long-term (current) use of injectable non-insulin antidiabetic drugs: Secondary | ICD-10-CM | POA: Diagnosis not present

## 2023-11-29 DIAGNOSIS — E785 Hyperlipidemia, unspecified: Secondary | ICD-10-CM | POA: Diagnosis not present

## 2023-11-29 DIAGNOSIS — Z794 Long term (current) use of insulin: Secondary | ICD-10-CM | POA: Diagnosis not present

## 2023-11-29 DIAGNOSIS — R600 Localized edema: Secondary | ICD-10-CM | POA: Diagnosis not present

## 2023-11-29 MED ORDER — EMPAGLIFLOZIN 10 MG PO TABS: 10.0000 mg | ORAL_TABLET | Freq: Every day | ORAL | 1 refills | Status: DC

## 2023-11-29 MED ORDER — FENOFIBRATE 160 MG PO TABS: 160.0000 mg | ORAL_TABLET | Freq: Every day | ORAL | 1 refills | Status: DC

## 2023-11-29 MED ORDER — FUROSEMIDE 20 MG PO TABS: 20.0000 mg | ORAL_TABLET | Freq: Two times a day (BID) | ORAL | 1 refills | Status: DC

## 2023-11-29 MED ORDER — IPRATROPIUM BROMIDE 0.06 % NA SOLN: 2.0000 | Freq: Four times a day (QID) | NASAL | 12 refills | Status: DC | PRN

## 2023-11-29 MED ORDER — OMEPRAZOLE 20 MG PO CPDR: 20.0000 mg | DELAYED_RELEASE_CAPSULE | Freq: Two times a day (BID) | ORAL | 1 refills | Status: DC

## 2023-11-29 MED ORDER — SYMBICORT 160-4.5 MCG/ACT IN AERO: 2.0000 | INHALATION_SPRAY | Freq: Two times a day (BID) | RESPIRATORY_TRACT | 3 refills | Status: DC

## 2023-11-29 MED ORDER — POTASSIUM CHLORIDE CRYS ER 10 MEQ PO TBCR: 10.0000 meq | EXTENDED_RELEASE_TABLET | Freq: Two times a day (BID) | ORAL | 1 refills | Status: DC

## 2023-11-29 NOTE — Patient Instructions (Addendum)
 It was very nice to see you today!  Try omeprazole  daily only  if bad heartburn-go back to twice daily  Add atrovent  nasal   PLEASE NOTE:  If you had any lab tests please let us  know if you have not heard back within a few days. You may see your results on MyChart before we have a chance to review them but we will give you a call once they are reviewed by us . If we ordered any referrals today, please let us  know if you have not heard from their office within the next week.   Please try these tips to maintain a healthy lifestyle:  Eat most of your calories during the day when you are active. Eliminate processed foods including packaged sweets (pies, cakes, cookies), reduce intake of potatoes, white bread, white pasta, and white rice. Look for whole grain options, oat flour or almond flour.  Each meal should contain half fruits/vegetables, one quarter protein, and one quarter carbs (no bigger than a computer mouse).  Cut down on sweet beverages. This includes juice, soda, and sweet tea. Also watch fruit intake, though this is a healthier sweet option, it still contains natural sugar! Limit to 3 servings daily.  Drink at least 1 glass of water  with each meal and aim for at least 8 glasses per day  Exercise at least 150 minutes every week.

## 2023-11-29 NOTE — Progress Notes (Signed)
 Subjective:     Patient ID: Teresa Rice, female    DOB: 06-Aug-1946, 78 y.o.   MRN: [id number]  Chief Complaint  Patient presents with   Medical Management of Chronic Issues    3 month follow-up    HPI HTN -taking Diltiazem  240 mg daily, Valsartan  80 mg, and Metoprolol  100 mg (for palps). Her subsequent LE edema is managed by Lasix  20 mg bid; sees  cardiologist who she will also follow up with in January. BP's running not checking. Denies ha/dizziness/cp/cough/sob. HR is in 90's-no symptoms.  DM, type 2 - Managed with Metformin  1000 mg BID, Ozempic  2 mg weekly, Lantus  38u, and Jardiance  10 mg. Sugars running 126 this am(over indulged over [place]).  A few 200. Some days, just nibbles all day.  Not hungry in am so skips breakfast at times.  Has irregular schedule   has cgm and tests sev times/day and if feeling low.  Lowest 80.  HLD - Managed with Fenofibrate  160 mg and 10 mg Crestor . GERD-doing well on omeprazole  20 bid.  No dysphagia.  Hasn't tried off  Nose runs constantly-flonase  helped    not now   LE Edema - Managed with Furosemide  20 mg; notes improvement in swelling though left foot occasionally swells at night.  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   Mild intermittent asthma    OA (osteoarthritis)    fingers    Palpitations 09/04/2022   Trigger finger of both hands    wear slints and special gloves at night   Type 2 diabetes mellitus [facility])     Past Surgical History:  Procedure Laterality Date   BLADDER SURGERY  2012   in [place]   TURBT    CHOLECYSTECTOMY  11/26/1968   CYSTOSCOPY/RETROGRADE/URETEROSCOPY Left 05/24/2014   Procedure: CYSTOSCOPY/RETROGRADE/ URETEROSCOPY;  Surgeon: Teresa CHRISTELLA Matilda, MD;  Location: [facility];  Service: Urology;  Laterality: Left;   TRANSURETHRAL RESECTION OF BLADDER TUMOR N/A 05/24/2014   Procedure: TRANSURETHRAL RESECTION OF BLADDER TUMOR (TURBT) ;  Surgeon: Teresa CHRISTELLA Matilda, MD;  Location: [facility];  Service: Urology;  Laterality: N/A;   TRANSURETHRAL RESECTION OF BLADDER TUMOR N/A 08/04/2015   Procedure: TRANSURETHRAL RESECTION OF BLADDER TUMOR (TURBT);  Surgeon: Teresa Matilda, MD;  Location: [facility];  Service: Urology;  Laterality: N/A;   WRIST SURGERY Right    x3     Current Outpatient Medications:    Accu-Chek Softclix Lancets lancets, , Disp: , Rfl:    albuterol  (VENTOLIN  HFA) 108 (90 Base) MCG/ACT inhaler, INHALE 2 PUFFS BY MOUTH EVERY 6 HOURS AS NEEDED FOR WHEEZING OR SHORTNESS OF BREATH, Disp: 9 g, Rfl: 1   ALPRAZolam  (XANAX ) 1 MG tablet, TAKE 1/2 (ONE-HALF) TABLET BY MOUTH THREE TIMES DAILY AND 3/4 (THREE-FOURTHS) TABLET AT BEDTIME, Disp: 71 tablet, Rfl: 5   B-D UF III MINI PEN NEEDLES 31G X 5 MM MISC, SMARTSIG:1 Pen Needle SUB-Q Daily, Disp: 100 each, Rfl: 3   baclofen  (LIORESAL ) 10 MG tablet, Take 0.5 tablets (5 mg total)  by mouth 2 (two) times daily as needed for muscle spasms., Disp: 30 each, Rfl: 0   blood glucose meter kit and supplies, Dispense based on patient and insurance preference. Use up to four times daily as directed. (FOR ICD-10 E10.9, E11.9)., Disp: 1 each, Rfl: 0   Calcium  Carbonate (CALCIUM  600 PO), Take 1 tablet by mouth daily in the afternoon., Disp: , Rfl:    cholecalciferol (VITAMIN D3) 25 MCG (1000 UNIT) tablet, Take 1,000 Units by mouth daily., Disp: , Rfl:    Continuous Glucose Sensor (FREESTYLE LIBRE 2 SENSOR) MISC, Use to test blood sugar daily, Disp: 1 each, Rfl: 1   Continuous Glucose Sensor (FREESTYLE LIBRE 2 SENSOR) MISC, Place once  sensor every 14 days., Disp: 6 each, Rfl: 3   diltiazem  (CARDIZEM  CD) 240 MG 24 hr capsule, Take 1 capsule by mouth once daily, Disp: 90 capsule, Rfl: 2   fluticasone  (FLONASE ) 50 MCG/ACT nasal spray, Place into both nostrils daily., Disp: , Rfl:    glucose blood test strip, Use as instructed, Disp: 100 each, Rfl: 12   guaiFENesin -codeine  (ROBITUSSIN AC) 100-10 MG/5ML syrup, Take 5 mLs by mouth at bedtime as needed for cough., Disp: 70 mL, Rfl: 0   ipratropium (ATROVENT ) 0.06 % nasal spray, Place 2 sprays into both nostrils every 6 (six) hours as needed for rhinitis., Disp: 15 mL, Rfl: 12   LANTUS  SOLOSTAR 100 UNIT/ML Solostar Pen, Inject 40 Units into the skin daily., Disp: 45 mL, Rfl: 3   levothyroxine  (SYNTHROID ) 25 MCG tablet, TAKE 1 TABLET BY MOUTH ONCE DAILY FOR THYROID , Disp: 90 tablet, Rfl: 3   magnesium oxide (MAG-OX) 400 MG tablet, Take 400 mg by mouth 2 (two) times daily., Disp: , Rfl:    metFORMIN  (GLUCOPHAGE ) 1000 MG tablet, TAKE 1 TABLET BY MOUTH TWICE DAILY WITH A MEAL, Disp: 180 tablet, Rfl: 1   metoprolol  succinate (TOPROL  XL) 100 MG 24 hr tablet, Take 1 tablet (100 mg total) by mouth daily. Take with or immediately following a meal., Disp: 90 tablet, Rfl: 2   Multiple Vitamin (MULTIVITAMIN WITH MINERALS) TABS tablet, Take 1 tablet by mouth daily., Disp: , Rfl:    nystatin cream (MYCOSTATIN), Apply topically., Disp: , Rfl:    Omega 3 1000 MG CAPS, Take 1,000 mg by mouth daily., Disp: , Rfl:    OZEMPIC , 2 MG/DOSE, 8 MG/3ML SOPN, Inject 2 mg into the skin once a week., Disp: 9 mL, Rfl: 3   Polyethylene Glycol 3350 (MIRALAX PO), Take by mouth every evening., Disp: , Rfl:    potassium chloride  (KLOR-CON ) 10 MEQ tablet, Take 10 mEq by mouth 2 (two) times daily., Disp: , Rfl:    rosuvastatin  (CRESTOR ) 10 MG tablet, Take 1 tablet by mouth once daily, Disp: 90 tablet, Rfl: 1   traMADol  (ULTRAM ) 50 MG tablet, Take 1 tablet by mouth every 6 (six) hours as needed., Disp: , Rfl:     triamcinolone cream (KENALOG) 0.1 %, Apply topically 3 (three) times daily., Disp: , Rfl:    valsartan  (DIOVAN ) 80 MG tablet, Take 1 tablet (80 mg total) by mouth daily., Disp: 90 tablet, Rfl: 3   Vilazodone  HCl 20 MG TABS, Take 1 tablet (20 mg total) by mouth daily., Disp: 90 tablet, Rfl: 1   VITAMIN E PO, Take by mouth., Disp: , Rfl:    empagliflozin  (JARDIANCE ) 10 MG TABS tablet, Take 1 tablet (10 mg total) by mouth daily before breakfast., Disp: 90 tablet, Rfl: 1   fenofibrate  160 MG tablet,  Take 1 tablet (160 mg total) by mouth daily., Disp: 90 tablet, Rfl: 1   furosemide  (LASIX ) 20 MG tablet, Take 1 tablet (20 mg total) by mouth 2 (two) times daily., Disp: 180 tablet, Rfl: 1   omeprazole  (PRILOSEC) 20 MG capsule, Take 1 capsule (20 mg total) by mouth 2 (two) times daily before a meal., Disp: 180 capsule, Rfl: 1   potassium chloride  (KLOR-CON  M) 10 MEQ tablet, Take 1 tablet (10 mEq total) by mouth 2 (two) times daily., Disp: 180 tablet, Rfl: 1   SYMBICORT  160-4.5 MCG/ACT inhaler, Inhale 2 puffs into the lungs in the morning and at bedtime., Disp: 33 g, Rfl: 3  Allergies  Allergen Reactions   Celebrex [Celecoxib] Anaphylaxis   Glipizide Itching   Penicillins Other (See Comments)    Burn marks from inside out    Sulfa Antibiotics Itching   ROS neg/noncontributory except as noted HPI/below      Objective:     BP 118/67   Pulse 91   Temp (!) 97.1 F (36.2 C) (Temporal)   Resp 18   Ht 5' 3 (1.6 m)   Wt 164 lb 4 oz (74.5 kg)   LMP  (LMP Unknown)   SpO2 96%   BMI 29.10 kg/m  Wt Readings from Last 3 Encounters:  11/29/23 164 lb 4 oz (74.5 kg)  11/19/23 161 lb (73 kg)  09/30/23 161 lb 9.6 oz (73.3 kg)    Physical Exam   Gen: WDWN NAD HEENT: NCAT, conjunctiva not injected, sclera nonicteric NECK:  supple, no thyromegaly, no nodes, no carotid bruits CARDIAC: RRR, S1S2+, no murmur. DP 2+B LUNGS: CTAB. No wheezes ABDOMEN:  BS+, soft, NTND, No HSM, no masses EXT:  no  edema MSK: no gross abnormalities.  NEURO: A&O x3.  CN II-XII intact.  PSYCH: normal mood. Good eye contact   Assessment & Plan:  Type 2 diabetes mellitus with insulin therapy (HCC) Assessment & Plan: Chronic. controlled.  Doing better on diet.  Increase exercise. Continue ozempic  2mg  weekly, metformin  1000mg  bid, jardiance  10mg ,  lantus  36 units daily(decrease d/t lows).  Will repeat labs 86m(declines today)  Orders: -     Empagliflozin ; Take 1 tablet (10 mg total) by mouth daily before breakfast.  Dispense: 90 tablet; Refill: 1  Hypertension associated with diabetes (HCC) Assessment & Plan: Chronic.  Well controlled.  Continue valsartan  80mg .  Continue cartia  240mg  and metoprolol    Gastroesophageal reflux disease without esophagitis Assessment & Plan: Chronic.  Controlled on omeprazole  20mg  bid.  Advised to try just daily and see if symptoms controlled  Orders: -     Omeprazole ; Take 1 capsule (20 mg total) by mouth 2 (two) times daily before a meal.  Dispense: 180 capsule; Refill: 1  Hyperlipidemia associated with type 2 diabetes mellitus (HCC) Assessment & Plan: Chronic.  Not ideal.  Continue crestor  10mg  and fenofibrate  160mg  and get sugars controlled.   Orders: -     Fenofibrate ; Take 1 tablet (160 mg total) by mouth daily.  Dispense: 90 tablet; Refill: 1  Localized edema Assessment & Plan: Chronic.  Well controlled.  Pt doing lasix  20mg  bid.    Orders: -     Furosemide ; Take 1 tablet (20 mg total) by mouth 2 (two) times daily.  Dispense: 180 tablet; Refill: 1 -     Potassium Chloride  Crys ER; Take 1 tablet (10 mEq total) by mouth 2 (two) times daily.  Dispense: 180 tablet; Refill: 1  Seasonal allergies Assessment & Plan: Chronic.  Not controlled on flonase  alone.  Add atrovent  nasal 2 puffs q 6hr prn  Orders: -     Ipratropium Bromide ; Place 2 sprays into both nostrils every 6 (six) hours as needed for rhinitis.  Dispense: 15 mL; Refill: 12  Estrogen  deficiency -     DG Bone Density; Future  Encounter for long-term (current) use of insulin (HCC)  Long term current use of oral hypoglycemic drug  Long-term current use of injectable noninsulin antidiabetic medication  Other orders -     Symbicort ; Inhale 2 puffs into the lungs in the morning and at bedtime.  Dispense: 33 g; Refill: 3     Return in about 3 months (around 02/27/2024) for DM.   Teresa CHRISTELLA Carrel, MD

## 2023-12-01 NOTE — Assessment & Plan Note (Signed)
Chronic.  Not ideal.  Continue crestor 10mg  and fenofibrate 160mg  and get sugars controlled.

## 2023-12-01 NOTE — Assessment & Plan Note (Signed)
 Chronic.  Well controlled.  Pt doing lasix 20mg  bid.

## 2023-12-01 NOTE — Assessment & Plan Note (Signed)
 Chronic.  Well controlled.  Continue valsartan 80mg .  Continue cartia 240mg  and metoprolol

## 2023-12-01 NOTE — Assessment & Plan Note (Signed)
 Chronic.  Not controlled on flonase alone.  Add atrovent nasal 2 puffs q 6hr prn

## 2023-12-01 NOTE — Assessment & Plan Note (Signed)
 Chronic.  Controlled on omeprazole 20mg  bid.  Advised to try just daily and see if symptoms controlled

## 2023-12-01 NOTE — Assessment & Plan Note (Signed)
 Chronic. controlled.  Doing better on diet.  Increase exercise. Continue ozempic 2mg  weekly, metformin 1000mg  bid, jardiance 10mg ,  lantus 36 units daily(decrease d/t lows).  Will repeat labs 31m(declines today)

## 2023-12-09 ENCOUNTER — Ambulatory Visit (HOSPITAL_BASED_OUTPATIENT_CLINIC_OR_DEPARTMENT_OTHER): Payer: 59 | Admitting: Family

## 2023-12-09 ENCOUNTER — Encounter (HOSPITAL_BASED_OUTPATIENT_CLINIC_OR_DEPARTMENT_OTHER): Payer: Self-pay | Admitting: Family

## 2023-12-09 VITALS — BP 118/60 | HR 94 | Ht 63.0 in | Wt 166.6 lb

## 2023-12-09 DIAGNOSIS — E1165 Type 2 diabetes mellitus with hyperglycemia: Secondary | ICD-10-CM

## 2023-12-09 DIAGNOSIS — I4711 Inappropriate sinus tachycardia, so stated: Secondary | ICD-10-CM | POA: Diagnosis not present

## 2023-12-09 DIAGNOSIS — R002 Palpitations: Secondary | ICD-10-CM | POA: Diagnosis not present

## 2023-12-09 DIAGNOSIS — I10 Essential (primary) hypertension: Secondary | ICD-10-CM | POA: Diagnosis not present

## 2023-12-09 DIAGNOSIS — E782 Mixed hyperlipidemia: Secondary | ICD-10-CM | POA: Diagnosis not present

## 2023-12-09 DIAGNOSIS — Z794 Long term (current) use of insulin: Secondary | ICD-10-CM

## 2023-12-09 MED ORDER — METOPROLOL SUCCINATE ER 100 MG PO TB24: 100.0000 mg | ORAL_TABLET | Freq: Every day | ORAL | 2 refills | Status: DC

## 2023-12-09 NOTE — Patient Instructions (Addendum)
 Medication Instructions:  Continue your current medications  Recommend trying over the counter loratidine (Claritin) or fexofenadine (Allegra) once per day for post nasal drip along with your Flonase .   *If you need a refill on your cardiac medications before your next appointment, please call your pharmacy*  Follow-Up: At [facility], you and your health needs are our priority.  As part of our continuing mission to provide you with exceptional heart care, we have created designated Provider Care Teams.  These Care Teams include your primary Cardiologist (physician) and Advanced Practice Providers (APPs -  Physician Assistants and Nurse Practitioners) who all work together to provide you with the care you need, when you need it.  We recommend signing up for the patient portal called MyChart.  Sign up information is provided on this After Visit Summary.  MyChart is used to connect with patients for Virtual Visits (Telemedicine).  Patients are able to view lab/test results, encounter notes, upcoming appointments, etc.  Non-urgent messages can be sent to your provider as well.   To learn more about what you can do with MyChart, go to [email or web address].    Your next appointment:   6 month(s)  Provider:   Annabella Scarce, MD or Reche Finder, NP    Other Instructions        Pursed Lip Breathing Try this when you have an episdoes of palpitations, racing heart.  Start the exercise by closing your mouth. Breathe in through your nose, taking a normal breath. You can do this at your normal rate of breathing. If you feel you are not getting enough air, breathe in while slowly counting to 2 or 3. Pucker (purse) your lips as if you were going to whistle. Gently tighten the muscles of your abdomenor press on your abdomen to help push the air out. Breathe out slowly through your pursed lips. Take at least twice as long to breathe out as it takes you to breathe in. Make sure that  you breathe out all of the air, but do not force air out.

## 2023-12-09 NOTE — Progress Notes (Signed)
 Cardiology Office Note:  .   Date:  12/09/2023  ID:  Teresa Rice, DOB 05-Jun-1946, MRN [id number] PCP: Wendolyn Jenkins Jansky, MD  [facility] Providers Cardiologist:  Annabella Scarce, MD    History of Present Illness: .   Teresa Rice is a 78 y.o. female with a hx of tachycardia, HTN, anxiety, Dm2 last seen 03/11/23.   Saw PCP 06/2022 noting palpitations with EKG revealing PACs.  At follow-up later that month metoprolol  was increased to 75 mg and she was referred to cardiology.   Established with Dr. Scarce 09/04/2022.Metoprolol  was stopped and she was started on diltiazem  240 mg daily.  7-day ZIO ordered with preliminary report with NSR and rare PVC/PAC <1% burden with no significant arrhythmia.Echo 09/19/22 with normal LVEF 55-60%, no RWMA, mild asymmetric LVH   At visit 10/17/2023 losartan  was stopped and transition to valsartan  160 mg daily for improved blood pressure control.  Home sleep study was ordered but not performed.   At visit 01/07/2023 Metoprolol  succinate 25 mg daily added for improved heart rate control.   Sen 03/11/23 due to palpitations Metoprolol  succinate was increased ot 50mg  daily. She was sleeping better and politely declined sleep study. At visit 05/2023 Metoprolol  succinate was further increased to 100mg  for palpitations.   Presents today for follow up independently. Feeling overall wells since last seen. No chest pain, shortness of breath, lightheadedness, dizziness. Notes she had a cold recently and is worried it is progressing to a sinus infection. She also notes post nasal drip in the morning- discussed trying OTC Claritin or Allegra as reports Zyrtec previously did not work.  She associates this with headache which is improving with Tylenol . She had 2 episodes of palpitations both during stress - once when her daughter was in ED with GI bleed and once when a close friend from back home passed away.   We spet a significant amount of time reviewing  nutrition. She notes nausea when looking at food and some GI upset with foods such as ice cream or excess cheese. Likely some lactose intolerance but also reaction of her system/Ozempic  to all of the sugar in ice cream. Handouts provided on fiber and protein sources to help stabilize her blood sugar and prompt weight loss on her current diabetes regimen.   ROS: Please see the history of present illness.    All other systems reviewed and are negative.   Studies Reviewed: .           Risk Assessment/Calculations:         STOP-Bang Score:         Physical Exam:   VS:  BP 118/60   Pulse 94   Ht 5' 3 (1.6 m)   Wt 166 lb 9.6 oz (75.6 kg)   LMP  (LMP Unknown)   SpO2 94%   BMI 29.51 kg/m    Wt Readings from Last 3 Encounters:  12/09/23 166 lb 9.6 oz (75.6 kg)  11/29/23 164 lb 4 oz (74.5 kg)  11/19/23 161 lb (73 kg)    GEN: Well nourished, well developed in no acute distress NECK: No JVD; No carotid bruits CARDIAC: RRR, no murmurs, rubs, gallops RESPIRATORY:  Clear to auscultation without rales, wheezing or rhonchi  ABDOMEN: Soft, non-tender, non-distended EXTREMITIES:  No edema; No deformity   ASSESSMENT AND PLAN: .    HTN -  BP at goal of <130/80. Relatively hypotensive today but asymptomatic with no lightheadedness, dizziness. Continue Diltiazem  240mg , Valsartan  80mg , Toprol  100mg   daily. Refill provided. Discussed to monitor BP at home at least 2 hours after medications and sitting for 5-10 minutes.    LE edema -   Has been better recently. Likely etiology venous insufficiency as echo 08/2022 normal LVEF.  Continue Lasix  20mg  BID. Encouraged to continue wearing her compression stockings.    Inappropriate sinus tachycardia / Palpitations - Prior monitor 09/2022 with inappropriate sinus tachycardia. Palpitations only during times of stress. Continue Toprol  100mg  every day, Diltiazem  240mg  daily. Encouraged to continue to avoid caffeine, stay well hydrated. We discussed PRN metoprolol   tartrate for breakthrough palpitations but as episodes are infrequent, prefers to avoid additional medications.    Snores - STOP Bang 5. Sleep study previously ordered. However, since then sleep has been better and she has politely declined sleep study.    DM2 - Continue to follow with PCP. Motivated to work of lifestyle changes. Ozempic /Jardiance  have helped with her appetite. Using Atkins protein shake. Handouts provided on protein/fiber rich food choices. Offered referral to nutrition & diabetes education which she politely declined.    HLD - 08/27/23 total cholesterol 125, triglycerides 217, HDL 30.5, LDL 51. She is working on blood sugar control which will help triglycerides.  Continue Rosuvastatin  10mg  daily, Fenofibrate  160mg  daily.       Dispo: follow up in in 6 months with Dr. Raford or APP  Signed, Teresa GORMAN Finder, NP

## 2024-01-08 ENCOUNTER — Other Ambulatory Visit: Payer: Self-pay | Admitting: Family Medicine

## 2024-01-20 ENCOUNTER — Other Ambulatory Visit: Payer: Self-pay | Admitting: Family Medicine

## 2024-01-27 ENCOUNTER — Encounter: Payer: Self-pay | Admitting: Psychiatry

## 2024-01-27 ENCOUNTER — Ambulatory Visit: Payer: 59 | Admitting: Psychiatry

## 2024-01-27 DIAGNOSIS — F331 Major depressive disorder, recurrent, moderate: Secondary | ICD-10-CM

## 2024-01-27 DIAGNOSIS — F5105 Insomnia due to other mental disorder: Secondary | ICD-10-CM | POA: Diagnosis not present

## 2024-01-27 DIAGNOSIS — F4310 Post-traumatic stress disorder, unspecified: Secondary | ICD-10-CM

## 2024-01-27 DIAGNOSIS — F4001 Agoraphobia with panic disorder: Secondary | ICD-10-CM

## 2024-01-27 MED ORDER — SERTRALINE HCL 50 MG PO TABS
ORAL_TABLET | ORAL | 0 refills | Status: DC
Start: 2024-01-27 — End: 2024-05-01

## 2024-01-27 MED ORDER — ALPRAZOLAM 1 MG PO TABS: ORAL_TABLET | ORAL | 5 refills | Status: DC

## 2024-01-27 NOTE — Patient Instructions (Signed)
 Reduce vilazodone to 1/2 tablet daily and start 1/2 sertraline tablet daily for 1 week, Then stop vilazodone and increase sertraline to 1 of the 50 mg tablets daily.
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
## 2024-02-02 ENCOUNTER — Other Ambulatory Visit: Payer: Self-pay | Admitting: Psychiatry

## 2024-02-02 DIAGNOSIS — F331 Major depressive disorder, recurrent, moderate: Secondary | ICD-10-CM

## 2024-02-02 DIAGNOSIS — F4310 Post-traumatic stress disorder, unspecified: Secondary | ICD-10-CM

## 2024-02-02 DIAGNOSIS — F4001 Agoraphobia with panic disorder: Secondary | ICD-10-CM

## 2024-02-27 ENCOUNTER — Ambulatory Visit: Payer: 59 | Admitting: Family Medicine

## 2024-03-02 ENCOUNTER — Encounter: Payer: Self-pay | Admitting: Family Medicine

## 2024-03-02 ENCOUNTER — Ambulatory Visit (INDEPENDENT_AMBULATORY_CARE_PROVIDER_SITE_OTHER): Admitting: Family Medicine

## 2024-03-02 VITALS — BP 119/67 | HR 80 | Temp 97.8°F | Resp 18 | Ht 63.0 in | Wt 161.2 lb

## 2024-03-02 DIAGNOSIS — E785 Hyperlipidemia, unspecified: Secondary | ICD-10-CM | POA: Diagnosis not present

## 2024-03-02 DIAGNOSIS — K219 Gastro-esophageal reflux disease without esophagitis: Secondary | ICD-10-CM

## 2024-03-02 DIAGNOSIS — Z79899 Other long term (current) drug therapy: Secondary | ICD-10-CM

## 2024-03-02 DIAGNOSIS — E1169 Type 2 diabetes mellitus with other specified complication: Secondary | ICD-10-CM | POA: Diagnosis not present

## 2024-03-02 DIAGNOSIS — Z794 Long term (current) use of insulin: Secondary | ICD-10-CM | POA: Diagnosis not present

## 2024-03-02 DIAGNOSIS — Z7985 Long-term (current) use of injectable non-insulin antidiabetic drugs: Secondary | ICD-10-CM

## 2024-03-02 DIAGNOSIS — E1159 Type 2 diabetes mellitus with other circulatory complications: Secondary | ICD-10-CM | POA: Diagnosis not present

## 2024-03-02 DIAGNOSIS — Z7984 Long term (current) use of oral hypoglycemic drugs: Secondary | ICD-10-CM | POA: Diagnosis not present

## 2024-03-02 DIAGNOSIS — I152 Hypertension secondary to endocrine disorders: Secondary | ICD-10-CM | POA: Diagnosis not present

## 2024-03-02 DIAGNOSIS — R600 Localized edema: Secondary | ICD-10-CM

## 2024-03-02 DIAGNOSIS — E119 Type 2 diabetes mellitus without complications: Secondary | ICD-10-CM

## 2024-03-02 DIAGNOSIS — E039 Hypothyroidism, unspecified: Secondary | ICD-10-CM

## 2024-03-02 DIAGNOSIS — I4711 Inappropriate sinus tachycardia, so stated: Secondary | ICD-10-CM

## 2024-03-02 LAB — CBC WITH DIFFERENTIAL/PLATELET
Basophils Absolute: 0.1 10*3/uL (ref 0.0–0.1)
Basophils Relative: 1.2 % (ref 0.0–3.0)
Eosinophils Absolute: 0.2 10*3/uL (ref 0.0–0.7)
Eosinophils Relative: 3.3 % (ref 0.0–5.0)
HCT: 42.2 % (ref 36.0–46.0)
Hemoglobin: 13.7 g/dL (ref 12.0–15.0)
Lymphocytes Relative: 25.8 % (ref 12.0–46.0)
Lymphs Abs: 1.7 10*3/uL (ref 0.7–4.0)
MCHC: 32.4 g/dL (ref 30.0–36.0)
MCV: 77.8 fl — ABNORMAL LOW (ref 78.0–100.0)
Monocytes Absolute: 0.5 10*3/uL (ref 0.1–1.0)
Monocytes Relative: 6.9 % (ref 3.0–12.0)
Neutro Abs: 4.2 10*3/uL (ref 1.4–7.7)
Neutrophils Relative %: 62.8 % (ref 43.0–77.0)
Platelets: 291 10*3/uL (ref 150.0–400.0)
RBC: 5.42 Mil/uL — ABNORMAL HIGH (ref 3.87–5.11)
RDW: 16.2 % — ABNORMAL HIGH (ref 11.5–15.5)
WBC: 6.7 10*3/uL (ref 4.0–10.5)

## 2024-03-02 LAB — TSH: TSH: 1.99 u[IU]/mL (ref 0.35–5.50)

## 2024-03-02 LAB — HEMOGLOBIN A1C: Hgb A1c MFr Bld: 6.8 % — ABNORMAL HIGH (ref 4.6–6.5)

## 2024-03-02 LAB — VITAMIN B12: Vitamin B-12: 390 pg/mL (ref 211–911)

## 2024-03-02 LAB — LIPID PANEL
Cholesterol: 115 mg/dL (ref 0–200)
HDL: 31.1 mg/dL — ABNORMAL LOW (ref >39.00)
LDL Cholesterol: 49 mg/dL (ref 0–99)
NonHDL: 83.89
Total CHOL/HDL Ratio: 4
Triglycerides: 173 mg/dL — ABNORMAL HIGH (ref 0.0–149.0)
VLDL: 34.6 mg/dL (ref 0.0–40.0)

## 2024-03-02 LAB — COMPREHENSIVE METABOLIC PANEL WITH GFR
ALT: 17 U/L (ref 0–35)
AST: 15 U/L (ref 0–37)
Albumin: 4.4 g/dL (ref 3.5–5.2)
Alkaline Phosphatase: 87 U/L (ref 39–117)
BUN: 22 mg/dL (ref 6–23)
CO2: 24 meq/L (ref 19–32)
Calcium: 9.5 mg/dL (ref 8.4–10.5)
Chloride: 108 meq/L (ref 96–112)
Creatinine, Ser: 0.77 mg/dL (ref 0.40–1.20)
GFR: 73.87 mL/min (ref >60.00)
Glucose, Bld: 127 mg/dL — ABNORMAL HIGH (ref 70–99)
Potassium: 4.1 meq/L (ref 3.5–5.1)
Sodium: 141 meq/L (ref 135–145)
Total Bilirubin: 0.5 mg/dL (ref 0.2–1.2)
Total Protein: 7.2 g/dL (ref 6.0–8.3)

## 2024-03-02 LAB — MICROALBUMIN / CREATININE URINE RATIO
Creatinine,U: 73.6 mg/dL
Microalb Creat Ratio: 12.8 mg/g (ref 0.0–30.0)
Microalb, Ur: 0.9 mg/dL (ref 0.0–1.9)

## 2024-03-02 NOTE — Progress Notes (Signed)
 Labs stable/at goal.  Can decrease metformin to once/day

## 2024-03-02 NOTE — Assessment & Plan Note (Signed)
 Chronic.  Well controlled.  Continue valsartan 80mg .  Continue cartia 240mg  and metoprolol 100mg 

## 2024-03-02 NOTE — Assessment & Plan Note (Signed)
Chronic.  Controlled.  Continue synthroid 0.025mg  daily

## 2024-03-02 NOTE — Progress Notes (Signed)
 Subjective:     Patient ID: Teresa Rice, female    DOB: 07/15/1946, 78 y.o.   MRN: [id number]  Chief Complaint  Patient presents with   Medical Management of Chronic Issues    3 month follow-up     HPI HTN -taking Diltiazem 240 mg daily, Valsartan 80 mg, and Metoprolol 100 mg (for palps). Her subsequent LE edema is managed by Lasix 20 mg bid; sees  cardiologist who she saw in January. BP's running not checking. Denies ha/dizziness/cp/cough/sob. HR is in 80-90's-no symptoms.  DM, type 2 - Managed with Metformin 1000 mg BID, Ozempic 2 mg weekly, Lantus 38u, and Jardiance 10 mg. Sugars running 110.  Occ 160.  Has irregular schedule   has cgm and "tests" sev times/day and if feeling low.  Lowest 80.  HLD - Managed with Fenofibrate 160 mg and 10 mg Crestor. GERD-doing well on omeprazole 20 bid.  No dysphagia.  Still occ heartburn Hypothyroidism-on synthroid  LE Edema - Managed with Furosemide 20 mg; notes improvement in swelling  Discussed the use of AI scribe software for clinical note transcription with the patient, who gave verbal consent to proceed.  History of Present Illness Teresa Rice is a 78 year old female who presents for routine follow-up.  She mentions a family history of diabetes, as her daughter has been told she is diabetic despite an A1c of 5.0, due to high insulin levels. She is concerned about her own insulin levels and dietary management, particularly regarding fruit consumption.   Health Maintenance Due  Topic Date Due   DEXA SCAN  10/15/2023    Past Medical History:  Diagnosis Date   Bladder cancer [facility]) first dx 2012   Recurrent Bladder Cancer--  (urologist-  dr Retta Diones)   Chronic constipation    Closed fracture of left distal radius 08/30/2016   Full dentures    GERD (gastroesophageal reflux disease)    Hyperlipidemia    Hypertension    Inappropriate sinus tachycardia (HCC) 01/07/2023   Mild intermittent asthma    OA (osteoarthritis)     fingers    Palpitations 09/04/2022   Trigger finger of both hands    wear slints and special gloves at night   Type 2 diabetes mellitus [facility])     Past Surgical History:  Procedure Laterality Date   BLADDER SURGERY  2012   in [place]   TURBT   CHOLECYSTECTOMY  11/26/1968   CYSTOSCOPY/RETROGRADE/URETEROSCOPY Left 05/24/2014   Procedure: CYSTOSCOPY/RETROGRADE/ URETEROSCOPY;  Surgeon: Chelsea Aus, MD;  Location: [facility];  Service: Urology;  Laterality: Left;   TRANSURETHRAL RESECTION OF BLADDER TUMOR N/A 05/24/2014   Procedure: TRANSURETHRAL RESECTION OF BLADDER TUMOR (TURBT) ;  Surgeon: Chelsea Aus, MD;  Location: [facility];  Service: Urology;  Laterality: N/A;   TRANSURETHRAL RESECTION OF BLADDER TUMOR N/A 08/04/2015   Procedure: TRANSURETHRAL RESECTION OF BLADDER TUMOR (TURBT);  Surgeon: Marcine Matar, MD;  Location: [facility];  Service: Urology;  Laterality: N/A;   WRIST SURGERY Right    x3     Current Outpatient Medications:    Accu-Chek Softclix Lancets lancets, , Disp: , Rfl:    albuterol (VENTOLIN HFA) 108 (90 Base) MCG/ACT inhaler, INHALE 2 PUFFS BY MOUTH EVERY 6 HOURS AS NEEDED FOR WHEEZING OR SHORTNESS OF BREATH, Disp: 9 g, Rfl: 1   ALPRAZolam (XANAX) 1 MG tablet, TAKE 1/2 (ONE-HALF) TABLET BY MOUTH THREE TIMES DAILY AND 3/4 (THREE-FOURTHS) TABLET AT BEDTIME, Disp: 71 tablet, Rfl:  5   B-D UF III MINI PEN NEEDLES 31G X 5 MM MISC, SMARTSIG:1 Pen Needle SUB-Q Daily, Disp: 100 each, Rfl: 3   baclofen (LIORESAL) 10 MG tablet, Take 0.5 tablets (5 mg total) by mouth 2 (two) times daily as needed for muscle spasms., Disp: 30 each, Rfl: 0   blood glucose meter kit and supplies, Dispense based on patient and insurance preference. Use up to four times daily as directed. (FOR ICD-10 E10.9, E11.9)., Disp: 1 each, Rfl: 0   Calcium Carbonate (CALCIUM 600 PO), Take 1 tablet by mouth daily in the afternoon., Disp: , Rfl:    cholecalciferol (VITAMIN D3) 25  MCG (1000 UNIT) tablet, Take 1,000 Units by mouth daily., Disp: , Rfl:    Continuous Glucose Sensor (FREESTYLE LIBRE 2 SENSOR) MISC, Use to test blood sugar daily, Disp: 1 each, Rfl: 1   Continuous Glucose Sensor (FREESTYLE LIBRE 2 SENSOR) MISC, PLACE 1 SENSOR EVERY 14 DAYS, Disp: 6 each, Rfl: 3   diltiazem (CARDIZEM CD) 240 MG 24 hr capsule, Take 1 capsule by mouth once daily, Disp: 90 capsule, Rfl: 2   empagliflozin (JARDIANCE) 10 MG TABS tablet, Take 1 tablet (10 mg total) by mouth daily before breakfast., Disp: 90 tablet, Rfl: 1   fenofibrate 160 MG tablet, Take 1 tablet (160 mg total) by mouth daily., Disp: 90 tablet, Rfl: 1   fluticasone (FLONASE) 50 MCG/ACT nasal spray, Place into both nostrils daily., Disp: , Rfl:    furosemide (LASIX) 20 MG tablet, Take 1 tablet (20 mg total) by mouth 2 (two) times daily., Disp: 180 tablet, Rfl: 1   glucose blood test strip, Use as instructed, Disp: 100 each, Rfl: 12   guaiFENesin-codeine (ROBITUSSIN AC) 100-10 MG/5ML syrup, Take 5 mLs by mouth at bedtime as needed for cough., Disp: 70 mL, Rfl: 0   ipratropium (ATROVENT) 0.06 % nasal spray, Place 2 sprays into both nostrils every 6 (six) hours as needed for rhinitis., Disp: 15 mL, Rfl: 12   LANTUS SOLOSTAR 100 UNIT/ML Solostar Pen, Inject 40 Units into the skin daily., Disp: 45 mL, Rfl: 3   levothyroxine (SYNTHROID) 25 MCG tablet, TAKE 1 TABLET BY MOUTH ONCE DAILY FOR THYROID, Disp: 90 tablet, Rfl: 3   magnesium oxide (MAG-OX) 400 MG tablet, Take 400 mg by mouth 2 (two) times daily., Disp: , Rfl:    metFORMIN (GLUCOPHAGE) 1000 MG tablet, TAKE 1 TABLET BY MOUTH TWICE DAILY WITH A MEAL, Disp: 180 tablet, Rfl: 1   metoprolol succinate (TOPROL XL) 100 MG 24 hr tablet, Take 1 tablet (100 mg total) by mouth daily. Take with or immediately following a meal., Disp: 90 tablet, Rfl: 2   Multiple Vitamin (MULTIVITAMIN WITH MINERALS) TABS tablet, Take 1 tablet by mouth daily., Disp: , Rfl:    nystatin cream  (MYCOSTATIN), Apply topically., Disp: , Rfl:    Omega 3 1000 MG CAPS, Take 1,000 mg by mouth daily., Disp: , Rfl:    omeprazole (PRILOSEC) 20 MG capsule, Take 1 capsule (20 mg total) by mouth 2 (two) times daily before a meal., Disp: 180 capsule, Rfl: 1   OZEMPIC, 2 MG/DOSE, 8 MG/3ML SOPN, Inject 2 mg into the skin once a week., Disp: 9 mL, Rfl: 3   Polyethylene Glycol 3350 (MIRALAX PO), Take by mouth every evening., Disp: , Rfl:    potassium chloride (KLOR-CON M) 10 MEQ tablet, Take 1 tablet (10 mEq total) by mouth 2 (two) times daily., Disp: 180 tablet, Rfl: 1   potassium chloride (KLOR-CON)  10 MEQ tablet, Take 10 mEq by mouth 2 (two) times daily., Disp: , Rfl:    rosuvastatin (CRESTOR) 10 MG tablet, Take 1 tablet by mouth once daily, Disp: 90 tablet, Rfl: 1   sertraline (ZOLOFT) 50 MG tablet, 1/2 tablet daily for 1 month then 1 tablet daily, Disp: 90 tablet, Rfl: 0   SYMBICORT 160-4.5 MCG/ACT inhaler, Inhale 2 puffs into the lungs in the morning and at bedtime., Disp: 33 g, Rfl: 3   traMADol (ULTRAM) 50 MG tablet, Take 1 tablet by mouth every 6 (six) hours as needed., Disp: , Rfl:    triamcinolone cream (KENALOG) 0.1 %, Apply topically 3 (three) times daily., Disp: , Rfl:    valsartan (DIOVAN) 80 MG tablet, Take 1 tablet (80 mg total) by mouth daily., Disp: 90 tablet, Rfl: 3   VITAMIN E PO, Take by mouth., Disp: , Rfl:   Allergies  Allergen Reactions   Celebrex [Celecoxib] Anaphylaxis   Glipizide Itching   Penicillins Other (See Comments)    "Burn marks from inside out"    Sulfa Antibiotics Itching   ROS neg/noncontributory except as noted HPI/below      Objective:     BP 119/67   Pulse 80   Temp 97.8 F (36.6 C) (Temporal)   Resp 18   Ht 5\' 3"  (1.6 m)   Wt 161 lb 4 oz (73.1 kg)   LMP  (LMP Unknown)   SpO2 95%   BMI 28.56 kg/m  Wt Readings from Last 3 Encounters:  03/02/24 161 lb 4 oz (73.1 kg)  12/09/23 166 lb 9.6 oz (75.6 kg)  11/29/23 164 lb 4 oz (74.5 kg)     Physical Exam   Gen: WDWN NAD HEENT: NCAT, conjunctiva not injected, sclera nonicteric NECK:  supple, no thyromegaly, no nodes, no carotid bruits CARDIAC: RRR, S1S2+, no murmur. DP 2+B LUNGS: CTAB. No wheezes ABDOMEN:  BS+, soft, NTND, No HSM, no masses EXT:  no edema MSK: no gross abnormalities.  NEURO: A&O x3.  CN II-XII intact.  PSYCH: normal mood. Good eye contact   Assessment & Plan:  Type 2 diabetes mellitus with insulin therapy (HCC) Assessment & Plan: Chronic. controlled.  Doing better on diet.  Increase exercise. Continue ozempic 2mg  weekly, metformin 1000mg  bid, jardiance 10mg ,  lantus 36 units daily  Orders: -     Comprehensive metabolic panel with GFR -     Hemoglobin A1c -     Microalbumin / creatinine urine ratio  Hyperlipidemia associated with type 2 diabetes mellitus (HCC) Assessment & Plan: Chronic.  Not ideal.  Continue crestor 10mg  and fenofibrate 160mg   Orders: -     Comprehensive metabolic panel with GFR -     Lipid panel  Hypertension associated with diabetes (HCC) Assessment & Plan: Chronic.  Well controlled.  Continue valsartan 80mg .  Continue cartia 240mg  and metoprolol 100mg   Orders: -     CBC with Differential/Platelet  Inappropriate sinus tachycardia (HCC)  Acquired hypothyroidism Assessment & Plan: Chronic.  Controlled.  Continue synthroid 0.025mg  daily  Orders: -     TSH  Gastroesophageal reflux disease without esophagitis Assessment & Plan: Chronic.  Controlled on omeprazole 20mg  bid.     Localized edema Assessment & Plan: Chronic.  Well controlled.  Pt doing lasix 20mg  bid.     Encounter for long-term (current) use of insulin (HCC)  Long term current use of oral hypoglycemic drug  Long-term current use of injectable noninsulin antidiabetic medication  High risk medication use -  Vitamin B12  Assessment and Plan Assessment & Plan Type 2 Diabetes Mellitus   Blood glucose levels are generally well-controlled  with occasional highs of 169 mg/dL and 829 mg/dL, and a low of 80 mg/dL. She is on Lantus 38 units, Ozempic 2 mg, Metformin 1000 mg twice daily, and Jardiance 10 mg. Dietary adjustments and a continuous glucose monitor are in use, with plans to switch to a Freestyle 3 Plus model by September. She is interested in reducing Metformin dosage if A1c levels are favorable. The decision to adjust Metformin will depend on A1c results, as current medication is effectively managing glucose levels. Continue current medications, monitor blood glucose levels regularly, and evaluate A1c levels to consider reducing Metformin dosage.  Hypertension   Blood pressure and heart palpitations are well-controlled with Diltiazem 240 mg, Valsartan 80 mg, and Metoprolol 100 mg. Previous palpitations were attributed to anxiety and panic attacks. Continue Diltiazem, Valsartan, and Metoprolol.  Hyperlipidemia   Currently managed with Crestor 10 mg and Fenofibrate 160 mg, effectively controlling cholesterol levels. Continue Crestor and Fenofibrate.  Edema   Occasional swelling occurs, particularly after consuming salty foods, and is managed with Lasix twice daily. Continue Lasix and advise dietary modifications to reduce salt intake.  Gastroesophageal Reflux Disease (GERD)   Heartburn is managed with Omeprazole 20 mg twice daily. Attempts to reduce dosage were unsuccessful, but relief is found with milk during episodes. Continue Omeprazole and advise dietary modifications to avoid heartburn triggers.  Hypothyroidism   Managed with Synthroid 25 mcg with no reported issues. Continue Synthroid.  General Health Maintenance   She is making lifestyle modifications, including dietary changes to manage diabetes and hyperlipidemia. She has successfully quit smoking and is maintaining weight loss. Encourage continued lifestyle modifications, including dietary changes and weight management.  Follow-up   Blood work will be done to  evaluate current health status, including A1c levels. Schedule a follow-up appointment in three months.      Return in about 3 months (around 06/01/2024) for chronic follow-up.   Angelena Sole, MD

## 2024-03-02 NOTE — Patient Instructions (Signed)

## 2024-03-02 NOTE — Assessment & Plan Note (Signed)
 Chronic.  Controlled on omeprazole 20mg  bid.

## 2024-03-02 NOTE — Assessment & Plan Note (Signed)
 Chronic.  Not ideal.  Continue crestor 10mg  and fenofibrate 160mg 

## 2024-03-02 NOTE — Assessment & Plan Note (Signed)
 Chronic.  Well controlled.  Pt doing lasix 20mg  bid.

## 2024-03-02 NOTE — Assessment & Plan Note (Signed)
 Chronic. controlled.  Doing better on diet.  Increase exercise. Continue ozempic 2mg  weekly, metformin 1000mg  bid, jardiance 10mg ,  lantus 36 units daily

## 2024-03-28 ENCOUNTER — Other Ambulatory Visit: Payer: Self-pay | Admitting: Family Medicine

## 2024-04-28 ENCOUNTER — Other Ambulatory Visit (HOSPITAL_BASED_OUTPATIENT_CLINIC_OR_DEPARTMENT_OTHER): Payer: Self-pay | Admitting: Cardiovascular Disease

## 2024-04-30 ENCOUNTER — Other Ambulatory Visit: Payer: Self-pay | Admitting: Psychiatry

## 2024-04-30 DIAGNOSIS — F331 Major depressive disorder, recurrent, moderate: Secondary | ICD-10-CM

## 2024-04-30 DIAGNOSIS — F4001 Agoraphobia with panic disorder: Secondary | ICD-10-CM

## 2024-04-30 DIAGNOSIS — F4310 Post-traumatic stress disorder, unspecified: Secondary | ICD-10-CM

## 2024-05-04 ENCOUNTER — Encounter: Payer: Self-pay | Admitting: Family Medicine

## 2024-05-04 ENCOUNTER — Other Ambulatory Visit (HOSPITAL_BASED_OUTPATIENT_CLINIC_OR_DEPARTMENT_OTHER): Payer: Self-pay | Admitting: Family

## 2024-05-04 ENCOUNTER — Ambulatory Visit (INDEPENDENT_AMBULATORY_CARE_PROVIDER_SITE_OTHER): Admitting: Family Medicine

## 2024-05-04 VITALS — BP 118/50 | HR 91 | Temp 97.7°F | Ht 63.0 in | Wt 158.0 lb

## 2024-05-04 DIAGNOSIS — I10 Essential (primary) hypertension: Secondary | ICD-10-CM

## 2024-05-04 DIAGNOSIS — J029 Acute pharyngitis, unspecified: Secondary | ICD-10-CM | POA: Diagnosis not present

## 2024-05-04 DIAGNOSIS — M6740 Ganglion, unspecified site: Secondary | ICD-10-CM | POA: Diagnosis not present

## 2024-05-04 DIAGNOSIS — J40 Bronchitis, not specified as acute or chronic: Secondary | ICD-10-CM | POA: Diagnosis not present

## 2024-05-04 LAB — POCT RAPID STREP A (OFFICE): Rapid Strep A Screen: NEGATIVE

## 2024-05-04 MED ORDER — AZITHROMYCIN 250 MG PO TABS: ORAL_TABLET | ORAL | 0 refills | Status: AC

## 2024-05-04 MED ORDER — GUAIFENESIN-CODEINE 100-10 MG/5ML PO SOLN: 5.0000 mL | Freq: Four times a day (QID) | ORAL | 0 refills | Status: DC | PRN

## 2024-05-04 NOTE — Progress Notes (Signed)
 Subjective:     Patient ID: Teresa Rice, female    DOB: 08-23-1946, 78 y.o.   MRN: [id number]  Chief Complaint  Patient presents with   Cyst    On middle finger of left hand; x2wks; popped it before and yellowish stuff came out and just put a bandage on it    Cough    Congestion and mucus in chest; x1-2wks; started out as a bad sore throat but feeling better a little     HPI Discussed the use of AI scribe software for clinical note transcription with the patient, who gave verbal consent to proceed.  History of Present Illness Teresa Rice is a 78 year old female who presents with a cyst on her left middle finger and respiratory symptoms.  She has a cyst on her left middle finger that appeared approximately three weeks ago following an RSV vaccination in her right arm. The cyst is painful, has ruptured, and is draining a thick mucous-like fluid.   She developed respiratory symptoms after returning from a beach trip last week. She experienced a severe sore throat on Thursday, followed by a persistent cough and congestion. She feels feverish with hot and clammy sensations and excessive sweating, particularly over the weekend. No shortness of breath, but she experienced wheezing on Thursday and Friday. She has been using her Symbicort  regularly and has not needed her albuterol  inhaler recently. She attempted to use Coricidin for her symptoms but found it ineffective.  Her past medical history includes diabetes and a previous adverse reaction to penicillin, which caused burn marks. She has tolerated amoxicillin  in the past without issues. She is currently taking medications for high blood pressure and is cautious about drug interactions, particularly with her Xanax .    Health Maintenance Due  Topic Date Due   DEXA SCAN  10/15/2023    Past Medical History:  Diagnosis Date   Bladder cancer [facility]) first dx 2012   Recurrent Bladder Cancer--  (urologist-  dr Joie Narrow)   Chronic  constipation    Closed fracture of left distal radius 08/30/2016   Full dentures    GERD (gastroesophageal reflux disease)    Hyperlipidemia    Hypertension    Inappropriate sinus tachycardia (HCC) 01/07/2023   Mild intermittent asthma    OA (osteoarthritis)    fingers    Palpitations 09/04/2022   Trigger finger of both hands    wear slints and special gloves at night   Type 2 diabetes mellitus [facility])     Past Surgical History:  Procedure Laterality Date   BLADDER SURGERY  2012   in [place]   TURBT   CHOLECYSTECTOMY  11/26/1968   CYSTOSCOPY/RETROGRADE/URETEROSCOPY Left 05/24/2014   Procedure: CYSTOSCOPY/RETROGRADE/ URETEROSCOPY;  Surgeon: Roque Collar, MD;  Location: [facility];  Service: Urology;  Laterality: Left;   TRANSURETHRAL RESECTION OF BLADDER TUMOR N/A 05/24/2014   Procedure: TRANSURETHRAL RESECTION OF BLADDER TUMOR (TURBT) ;  Surgeon: Roque Collar, MD;  Location: [facility];  Service: Urology;  Laterality: N/A;   TRANSURETHRAL RESECTION OF BLADDER TUMOR N/A 08/04/2015   Procedure: TRANSURETHRAL RESECTION OF BLADDER TUMOR (TURBT);  Surgeon: Trent Frizzle, MD;  Location: [facility];  Service: Urology;  Laterality: N/A;   WRIST SURGERY Right    x3     Current Outpatient Medications:    Accu-Chek Softclix Lancets lancets, , Disp: , Rfl:    albuterol  (VENTOLIN  HFA) 108 (90 Base) MCG/ACT inhaler, INHALE 2 PUFFS BY MOUTH EVERY 6  HOURS AS NEEDED FOR WHEEZING OR SHORTNESS OF BREATH, Disp: 9 g, Rfl: 1   ALPRAZolam  (XANAX ) 1 MG tablet, TAKE 1/2 (ONE-HALF) TABLET BY MOUTH THREE TIMES DAILY AND 3/4 (THREE-FOURTHS) TABLET AT BEDTIME, Disp: 71 tablet, Rfl: 5   azithromycin  (ZITHROMAX ) 250 MG tablet, Take 2 tablets on day 1, then 1 tablet daily on days 2 through 5, Disp: 6 tablet, Rfl: 0   B-D UF III MINI PEN NEEDLES 31G X 5 MM MISC, SMARTSIG:1 Pen Needle SUB-Q Daily, Disp: 100 each, Rfl: 3   baclofen  (LIORESAL ) 10 MG tablet, Take 0.5 tablets (5 mg total)  by mouth 2 (two) times daily as needed for muscle spasms., Disp: 30 each, Rfl: 0   blood glucose meter kit and supplies, Dispense based on patient and insurance preference. Use up to four times daily as directed. (FOR ICD-10 E10.9, E11.9)., Disp: 1 each, Rfl: 0   Calcium  Carbonate (CALCIUM  600 PO), Take 1 tablet by mouth daily in the afternoon., Disp: , Rfl:    cholecalciferol (VITAMIN D3) 25 MCG (1000 UNIT) tablet, Take 1,000 Units by mouth daily., Disp: , Rfl:    Continuous Glucose Sensor (FREESTYLE LIBRE 2 SENSOR) MISC, Use to test blood sugar daily, Disp: 1 each, Rfl: 1   Continuous Glucose Sensor (FREESTYLE LIBRE 2 SENSOR) MISC, PLACE 1 SENSOR EVERY 14 DAYS, Disp: 6 each, Rfl: 3   diltiazem  (CARTIA  XT) 240 MG 24 hr capsule, Take 1 capsule by mouth once daily, Disp: 90 capsule, Rfl: 2   empagliflozin  (JARDIANCE ) 10 MG TABS tablet, Take 1 tablet (10 mg total) by mouth daily before breakfast., Disp: 90 tablet, Rfl: 1   fenofibrate  160 MG tablet, Take 1 tablet (160 mg total) by mouth daily., Disp: 90 tablet, Rfl: 1   fluticasone  (FLONASE ) 50 MCG/ACT nasal spray, Place into both nostrils daily., Disp: , Rfl:    furosemide  (LASIX ) 20 MG tablet, Take 1 tablet (20 mg total) by mouth 2 (two) times daily., Disp: 180 tablet, Rfl: 1   glucose blood test strip, Use as instructed, Disp: 100 each, Rfl: 12   guaiFENesin -codeine  100-10 MG/5ML syrup, Take 5 mLs by mouth every 6 (six) hours as needed for cough., Disp: 118 mL, Rfl: 0   ipratropium (ATROVENT ) 0.06 % nasal spray, Place 2 sprays into both nostrils every 6 (six) hours as needed for rhinitis., Disp: 15 mL, Rfl: 12   LANTUS  SOLOSTAR 100 UNIT/ML Solostar Pen, Inject 40 Units into the skin daily., Disp: 45 mL, Rfl: 3   levothyroxine  (SYNTHROID ) 25 MCG tablet, TAKE 1 TABLET BY MOUTH ONCE DAILY FOR THYROID , Disp: 90 tablet, Rfl: 3   magnesium oxide (MAG-OX) 400 MG tablet, Take 400 mg by mouth 2 (two) times daily., Disp: , Rfl:    metFORMIN  (GLUCOPHAGE ) 1000  MG tablet, TAKE 1 TABLET BY MOUTH TWICE DAILY WITH A MEAL, Disp: 180 tablet, Rfl: 1   metoprolol  succinate (TOPROL  XL) 100 MG 24 hr tablet, Take 1 tablet (100 mg total) by mouth daily. Take with or immediately following a meal., Disp: 90 tablet, Rfl: 2   Multiple Vitamin (MULTIVITAMIN WITH MINERALS) TABS tablet, Take 1 tablet by mouth daily., Disp: , Rfl:    nystatin cream (MYCOSTATIN), Apply topically., Disp: , Rfl:    Omega 3 1000 MG CAPS, Take 1,000 mg by mouth daily., Disp: , Rfl:    omeprazole  (PRILOSEC) 20 MG capsule, Take 1 capsule (20 mg total) by mouth 2 (two) times daily before a meal., Disp: 180 capsule, Rfl: 1  OZEMPIC , 2 MG/DOSE, 8 MG/3ML SOPN, Inject 2 mg into the skin once a week., Disp: 9 mL, Rfl: 3   Polyethylene Glycol 3350 (MIRALAX PO), Take by mouth every evening., Disp: , Rfl:    potassium chloride  (KLOR-CON  M) 10 MEQ tablet, Take 1 tablet (10 mEq total) by mouth 2 (two) times daily., Disp: 180 tablet, Rfl: 1   potassium chloride  (KLOR-CON ) 10 MEQ tablet, Take 10 mEq by mouth 2 (two) times daily., Disp: , Rfl:    rosuvastatin  (CRESTOR ) 10 MG tablet, Take 1 tablet by mouth once daily, Disp: 90 tablet, Rfl: 1   sertraline  (ZOLOFT ) 50 MG tablet, Take 1 tablet (50 mg total) by mouth daily., Disp: 90 tablet, Rfl: 0   SYMBICORT  160-4.5 MCG/ACT inhaler, Inhale 2 puffs into the lungs in the morning and at bedtime., Disp: 33 g, Rfl: 3   traMADol  (ULTRAM ) 50 MG tablet, Take 1 tablet by mouth every 6 (six) hours as needed., Disp: , Rfl:    triamcinolone cream (KENALOG) 0.1 %, Apply topically 3 (three) times daily., Disp: , Rfl:    valsartan  (DIOVAN ) 80 MG tablet, Take 1 tablet (80 mg total) by mouth daily., Disp: 90 tablet, Rfl: 3   VITAMIN E PO, Take by mouth., Disp: , Rfl:   Allergies  Allergen Reactions   Celebrex [Celecoxib] Anaphylaxis   Glipizide Itching   Penicillins Other (See Comments)    "Burn marks from inside out"    Sulfa Antibiotics Itching   ROS  neg/noncontributory except as noted HPI/below      Objective:      BP (!) 118/50   Pulse 91   Temp 97.7 F (36.5 C)   Ht 5\' 3"  (1.6 m)   Wt 158 lb (71.7 kg)   LMP  (LMP Unknown)   SpO2 96%   BMI 27.99 kg/m  Wt Readings from Last 3 Encounters:  05/04/24 158 lb (71.7 kg)  03/02/24 161 lb 4 oz (73.1 kg)  12/09/23 166 lb 9.6 oz (75.6 kg)    Physical Exam   Gen: WDWN NAD HEENT: NCAT, conjunctiva not injected, sclera nonicteric TM WNL B, OP moist, no exudates  NECK:  supple, no thyromegaly, no nodes, no carotid bruits CARDIAC: RRR, S1S2+, no murmur.  LUNGS: CTAB. No wheezes EXT:  no edema MSK: no gross abnormalities.  NEURO: A&O x3.  CN II-XII intact.  PSYCH: normal mood. Good eye contact  Results for orders placed or performed in visit on 05/04/24  POCT rapid strep A   Collection Time: 05/04/24  8:15 AM  Result Value Ref Range   Rapid Strep A Screen Negative Negative       Assessment & Plan:  Mucoid cyst of joint -     Ambulatory referral to Hand Surgery  Bronchitis  Sore throat -     POCT rapid strep A  Other orders -     Azithromycin ; Take 2 tablets on day 1, then 1 tablet daily on days 2 through 5  Dispense: 6 tablet; Refill: 0 -     guaiFENesin -Codeine ; Take 5 mLs by mouth every 6 (six) hours as needed for cough.  Dispense: 118 mL; Refill: 0  Assessment and Plan Assessment & Plan Acute bronchitis   She has acute bronchitis with congestion, cough, sore throat, and possible fever persisting for about 10 days after a beach trip. Wheezing occurred on Thursday and Friday, and her asthma may be exacerbated by the bronchitis. Mucinex  was considered but not recommended due to potential drug interactions.  Prescribe Z-Pak (azithromycin ) for bronchitis. Use an albuterol  inhaler if wheezing or shortness of breath occurs. Prescribe Robitussin AC (codeine  cough syrup) for cough. Continue Coricetin and Delsym for cough management. Caution against taking cough medicine with  Xanax  due to risk of adverse effects. PDMP checked  Mucoid cyst   She has a mucoid cyst on the left middle finger, likely related to underlying arthritis. The cyst ruptured, draining thick mucous-like fluid, and this rupture is unrelated to the recent RSV vaccination. There is a risk of joint infection if the cyst is intentionally poked. Refer to a hand surgeon for evaluation and potential cleaning of the joint due to arthritis. Advise against intentionally poking the cyst to prevent joint infection.    Return if symptoms worsen or fail to improve.  Ellsworth Haas, MD

## 2024-05-04 NOTE — Patient Instructions (Signed)
 Referral to ortho  Meds sent to pharmacy  Worse, ER.   Use ialbuterol if needed

## 2024-05-06 ENCOUNTER — Ambulatory Visit: Admitting: Psychiatry

## 2024-05-06 ENCOUNTER — Encounter: Payer: Self-pay | Admitting: Psychiatry

## 2024-05-06 DIAGNOSIS — F5105 Insomnia due to other mental disorder: Secondary | ICD-10-CM | POA: Diagnosis not present

## 2024-05-06 DIAGNOSIS — F4310 Post-traumatic stress disorder, unspecified: Secondary | ICD-10-CM

## 2024-05-06 DIAGNOSIS — F331 Major depressive disorder, recurrent, moderate: Secondary | ICD-10-CM | POA: Diagnosis not present

## 2024-05-06 DIAGNOSIS — F4001 Agoraphobia with panic disorder: Secondary | ICD-10-CM

## 2024-05-06 DIAGNOSIS — G4721 Circadian rhythm sleep disorder, delayed sleep phase type: Secondary | ICD-10-CM

## 2024-05-06 MED ORDER — ALPRAZOLAM 1 MG PO TABS: ORAL_TABLET | ORAL | 2 refills | Status: DC

## 2024-05-06 NOTE — Progress Notes (Signed)
 Teresa Rice [id number] 01-11-1946 78 y.o.  Subjective:   Patient ID:  Teresa Rice is a 78 y.o. (DOB [birth year]) female.  Chief Complaint:  Chief Complaint  Patient presents with   Follow-up   Anxiety   Sleeping Problem    HPI Teresa Rice presents to the office today for follow-up Rice anxiety.    seen Dec 2020 without med changes though she had previously stopped SSRI AMA.  MD had concerns over LT panic,  05/25/20 appt with the following noted: No vaccine. Teresa Rice out Rice bed.  Hurt herself.  Needs eye surgery and worry about it and DM.  Fights off depression and anxiety.  Feels stressed.   No caffeine.  Not aware if something bothering her.  No full panic.  Not drowsy daytime. .  Also falling asleep on the couch though she doesn't want to do it.  Chest gets tight with anxiety. No weight loss off Paxil  but no weight gain either.  Overall is not worse still and pleased with that. 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 Still doing well with the Xanax . Usually 1/2 mg TID and 1 mg HS.  Plan no med changes  11/24/20 appt with following noted: Some issues with D about her past and a son taken from her at 33 yo.  Recent conflict, 2 days before Christmas,  through me for a loop and said things she regrets to her D out Rice anger.   The 28 yo son will not listen to her or let her tell her side Rice the story.  Very confusing to me.  It is a hard thing to cope with..  She tried to get court records about it but was told it was a closed case. Good news November 14 was baptized.  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  Baptized [name].  Will be another great grandmother will be a little boy.   Due 03/17/20.   Can awaken with chest pressure in the morning but it stops short Rice full panic.  Did not have it this morning.    Plan no med changes  01/18/2021 phone call complaining Rice very depression, staying in bed until 11 AM not wanting  to drive, and cannot shut her mind off and asking about an antidepressant.  She had previously been on paroxetine  but wanted to stop it.  Therefore we started duloxetine  30 mg 1 daily for 1 week and then 2 daily  03/08/2021 appointment with the following noted: Never started duloxetine  bc read about SE Rice tremor and never took it. The got RX for paroxetine  to restart and Had to stop it bc shaking and felt sick going into 3rd week on it.  Tremor too bad. Depression triggered by conversation between estranged son (stolen from me)  and her daughter.  Really upset her.   Now some days more depressed and some days she feels and functions better.  This problem sticks in her head and bothers her.  Thinks son said he hopes she dies.  Haven't talked to son in 50 years. More bad days than good.   Plan rec low dose 10 Lexapro   05/08/2021 appt noted: Taken Lexapro .  Calmer and don't feel upset.  I feel good.  No SE with the med.  Took a little while to help.  Doesn't talk about son.  That was the root Rice my problem. Patient reports stable mood and denies depressed or irritable moods.  Patient denies any recent difficulty with anxiety.  Patient denies difficulty with sleep initiation or maintenance. Denies appetite disturbance.  Patient reports that energy and motivation have been good.  Patient denies any difficulty with concentration.  Patient denies any suicidal ideation. Loves 2 great grand-sons [name] 2, [name] 9 weeks. Got Covid since here. Cough resolved but still a little tired.  10/11/21 appt noted: Stopped Lexapro  bc just wanted to see if I'd be OK and was more irritable.  Then restarted it last month. Sleep a long time.  She is fearful about cuitting it back  Doesn't feels she can do without Xanax  or reduce it.  Not markely depressed. Tolerating meds without SE.  No SI  04/10/22 APPT noted: Stopped Lexapro  AMA DT tremor hands.  Were so bad it was hard to hold a cup Rice coffee.  This resolved off the med.   Feels somewhat more irritable off Rice it.  A little  more depressed off it and is more nervous. Sleep good lately. Still alprazolam  same dose Thinks paroxetine  caused shakes too.  Plan: Don't change meds on her own..  Tends to want to reduce or stop SSRIs repeatedly and always gets worse.  Has done so again complaining Rice trmeor with them. Viibryd  10 for a week  then 20 mg daily.  7/20/2 Appt noted; VIibryd  good. Takes it with lunch.   More relaxed.  No shaking. Not much depression or irritability.  If feel like my old self. No SE Some tiredness.  Problems with BP meds. Sleep is good. Not a morning person.   Plan: Continue alprazolam  1 mg 1/2 tab TID and 3/4 tab HS Don't change meds on her own..  Tends to want to reduce or stop SSRIs repeatedly and always gets worse.  Has done so again complaining Rice trmeor with them. Viibryd  20 mg daily helped anxiety and irritability and depression.  01/23/23 appt noted: Couple mos ago panic and passed out in the AM.  Been having BP issues.  Episodes tachycardia. Asthma episodes leading to cough made cardiac px worse. No other panic in last couple Rice mos. Will lay in bed or couch and awaken 230 AM and then to bed and sleeps late.  Can't get adjusted with sleep cycle chronically.  Laying down a lot and then has frequent awakening.  Maybe 11 hours average.   Depression managed.   Tries to stay busy.  But SOB limits her some.  07/25/23 appt noted: Meds as above:  alprazolam  1 mg 1/2 tab TID and 3/4 tab HS, Viibryd  20 mg daily  compliant.  Without SE Good overall re; mood and anxiety. Still some trouble with tiredness maybe from DM meds.  But not sure.   Reads on internet about meds.   Gets extra cardiac beats causing some anxiety. Gets a little walking for exercise.   Sleep fair with awakening without reason.  Awake 15-20 min and then back to sleep. No daytime drowsiness.  No sig napping.   No balance or falling px.   No fear Rice going out but  sometimes doesn't want to do so.  Residual effects Rice Covid, not going out much.   Plan: Continue alprazolam  1 mg 1/2 tab TID and 3/4 tab HS Don't change meds on her own..  Tends to want to reduce or stop SSRIs repeatedly and always gets worse.  Been more consistent Viibryd  20 mg daily helped anxiety and irritability and depression.  01/27/24 appt noted: Meds: as above Asks to go  back to Zoloft  bc frequent BM.  Taking vilazodone  with food.   More anxiety and panic lately. 2 weeks ago pain in chest and hyperventilate.  Happened quite a few times.  Worries her.  Sense Rice dread comes on at times without reason.   Planning to call cardiac doctor this month. Can't tell the difference between panic and cardiac issues.   Had Noravirus for a week and then a nasty resp virus and wonders if it caused anxiety and panic.  SX come on for no reason. 3 Bible studies.  Uncomfortable in one Rice them bc so many people.  Trying not to avoid it.   Plan: DT complaints Rice diarrhea will switch back to low dose sertraline : Reduce vilazodone  to 1/2 tablet daily and start 1/2 sertraline  tablet daily for 1 week, Then stop vilazodone  and increase sertraline  to 1 Rice the 50 mg tablets daily.  05/06/24 appt noted:  Med: alprazolam  1 mg 1/2 tab TID and 3/4 tab HS, stopped Viibryd  20 mg daily, started sertraline  50  Doing good .  Just back from vacation.  Got sick bc forgot DM meds and then got URI.  Working on [employer].   Diarrhea resolved.   A month ago one panic.  Over all panic and anxiety is better with sertraline .  No panic since.   Complains Rice too much meds.   I feel a lot better other than URI. Asks to increase Xanax  to 1 mg HS bc doesn't go to sleep until 2-3 AM and doesn't sleep good.   Asks how to deal with controlling daughter.  Struggles with not blowing up at her.  Doesn't want to do that.   D will oppose pt spending her own money on things. Son is out Rice her life still.  He wants to believe the woman who  adopted him.     New PCP Teresa Rice, [place], geriatric doc  Past Psychiatric Medication Trials:   Paxil  59, sertraline  forgetfulness, Lexapro  10 tremor but benefit Viibryd  20 helped without tremor  trazodone, She has been under our care since 1998 and has been on the same dosage Rice Xanax  the whole time and most Rice the time took paroxetine  59  D on psych med: shakes too on zoloft  and others  M died when pt 78 yo.  F was mean man.    Review Rice Systems:  Review Rice Systems  Constitutional:  Positive for fatigue.  HENT:  Positive for hearing loss and voice change.   Respiratory:  Positive for cough and shortness Rice breath.   Cardiovascular:  Positive for palpitations.  Musculoskeletal:  Positive for back pain.  Neurological:  Positive for headaches. Negative for tremors.  Psychiatric/Behavioral:  Positive for sleep disturbance. Negative for agitation, behavioral problems, confusion, decreased concentration, dysphoric mood, hallucinations, self-injury and suicidal ideas. The patient is nervous/anxious. The patient is not hyperactive.     Medications: I have reviewed the patient's current medications.  Current Outpatient Medications  Medication Sig Dispense Refill   Accu-Chek Softclix Lancets lancets      albuterol  (VENTOLIN  HFA) 108 (90 Base) MCG/ACT inhaler INHALE 2 PUFFS BY MOUTH EVERY 6 HOURS AS NEEDED FOR WHEEZING OR SHORTNESS Rice BREATH 9 g 1   azithromycin  (ZITHROMAX ) 250 MG tablet Take 2 tablets on day 1, then 1 tablet daily on days 2 through 5 6 tablet 0   B-D UF III MINI PEN NEEDLES 31G X 5 MM MISC SMARTSIG:1 Pen Needle SUB-Q Daily 100 each 3  baclofen  (LIORESAL ) 10 MG tablet Take 0.5 tablets (5 mg total) by mouth 2 (two) times daily as needed for muscle spasms. 30 each 0   blood glucose meter kit and supplies Dispense based on patient and insurance preference. Use up to four times daily as directed. (FOR ICD-10 E10.9, E11.9). 1 each 0   Calcium  Carbonate (CALCIUM  600 PO)  Take 1 tablet by mouth daily in the afternoon.     cholecalciferol (VITAMIN D3) 25 MCG (1000 UNIT) tablet Take 1,000 Units by mouth daily.     Continuous Glucose Sensor (FREESTYLE LIBRE 2 SENSOR) MISC Use to test blood sugar daily 1 each 1   Continuous Glucose Sensor (FREESTYLE LIBRE 2 SENSOR) MISC PLACE 1 SENSOR EVERY 14 DAYS 6 each 3   diltiazem  (CARTIA  XT) 240 MG 24 hr capsule Take 1 capsule by mouth once daily 90 capsule 2   empagliflozin  (JARDIANCE ) 10 MG TABS tablet Take 1 tablet (10 mg total) by mouth daily before breakfast. 90 tablet 1   fenofibrate  160 MG tablet Take 1 tablet (160 mg total) by mouth daily. 90 tablet 1   fluticasone  (FLONASE ) 50 MCG/ACT nasal spray Place into both nostrils daily.     furosemide  (LASIX ) 20 MG tablet Take 1 tablet (20 mg total) by mouth 2 (two) times daily. 180 tablet 1   glucose blood test strip Use as instructed 100 each 12   guaiFENesin -codeine  100-10 MG/5ML syrup Take 5 mLs by mouth every 6 (six) hours as needed for cough. 118 mL 0   ipratropium (ATROVENT ) 0.06 % nasal spray Place 2 sprays into both nostrils every 6 (six) hours as needed for rhinitis. 15 mL 12   LANTUS  SOLOSTAR 100 UNIT/ML Solostar Pen Inject 40 Units into the skin daily. 45 mL 3   levothyroxine  (SYNTHROID ) 25 MCG tablet TAKE 1 TABLET BY MOUTH ONCE DAILY FOR THYROID  90 tablet 3   magnesium oxide (MAG-OX) 400 MG tablet Take 400 mg by mouth 2 (two) times daily.     metFORMIN  (GLUCOPHAGE ) 1000 MG tablet TAKE 1 TABLET BY MOUTH TWICE DAILY WITH A MEAL 180 tablet 1   metoprolol  succinate (TOPROL  XL) 100 MG 24 hr tablet Take 1 tablet (100 mg total) by mouth daily. Take with or immediately following a meal. 90 tablet 2   Multiple Vitamin (MULTIVITAMIN WITH MINERALS) TABS tablet Take 1 tablet by mouth daily.     nystatin cream (MYCOSTATIN) Apply topically.     Omega 3 1000 MG CAPS Take 1,000 mg by mouth daily.     omeprazole  (PRILOSEC) 20 MG capsule Take 1 capsule (20 mg total) by mouth 2 (two)  times daily before a meal. 180 capsule 1   OZEMPIC , 2 MG/DOSE, 8 MG/3ML SOPN Inject 2 mg into the skin once a week. 9 mL 3   Polyethylene Glycol 3350 (MIRALAX PO) Take by mouth every evening.     potassium chloride  (KLOR-CON  M) 10 MEQ tablet Take 1 tablet (10 mEq total) by mouth 2 (two) times daily. 180 tablet 1   potassium chloride  (KLOR-CON ) 10 MEQ tablet Take 10 mEq by mouth 2 (two) times daily.     rosuvastatin  (CRESTOR ) 10 MG tablet Take 1 tablet by mouth once daily 90 tablet 1   sertraline  (ZOLOFT ) 50 MG tablet Take 1 tablet (50 mg total) by mouth daily. 90 tablet 0   SYMBICORT  160-4.5 MCG/ACT inhaler Inhale 2 puffs into the lungs in the morning and at bedtime. 33 g 3   traMADol  (ULTRAM ) 50 MG tablet  Take 1 tablet by mouth every 6 (six) hours as needed.     triamcinolone cream (KENALOG) 0.1 % Apply topically 3 (three) times daily.     valsartan  (DIOVAN ) 80 MG tablet Take 1 tablet by mouth once daily 90 tablet 1   VITAMIN E PO Take by mouth.     ALPRAZolam  (XANAX ) 1 MG tablet TAKE 1/2 (ONE-HALF) TABLET BY MOUTH THREE TIMES DAILY AND 1 TABLET AT BEDTIME 75 tablet 2   No current facility-administered medications for this visit.    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   Mild intermittent asthma    OA (osteoarthritis)    fingers    Palpitations 09/04/2022   Trigger finger Rice both hands    wear slints and special gloves at night   Type 2  diabetes mellitus (HCC)     Family History  Problem Relation Age Rice Onset   Early death Mother    AAA (abdominal aortic aneurysm) Sister    Cancer Father    Diabetes Brother    Cancer Brother     Social History   Socioeconomic History   Marital status: Widowed    Spouse name: Not on file   Number Rice children: 1   Years Rice education: 7   Highest education level: Not on file  Occupational History   Occupation: retired  Tobacco Use   Smoking status: Former    Current packs/day: 0.00    Types: Cigarettes    Start date: 05/21/1964    Quit date: 05/21/1994    Years since quitting: 29.9   Smokeless tobacco: Never  Vaping Use   Vaping status: Never Used  Substance and Sexual Activity   Alcohol use: Yes    Comment: RARE   Drug use: No   Sexual activity: Not on file  Other Topics Concern   Not on file  Social History Narrative   Lives in an apartment, lives alone   One daughter   Right handed    Retired from [employer] work   Highest level Rice education:  GED   Social Drivers Rice Corporate investment banker Strain: Low Risk  (11/19/2023)   Overall Financial Resource Strain (CARDIA)    Difficulty Rice Paying Living Expenses: Not hard at all  Food Insecurity: No Food Insecurity (11/19/2023)   Hunger Vital Sign    Worried About Running Out Rice Food in the Last Year: Never true    Ran Out Rice Food in the Last Year: Never true  Transportation Needs: No Transportation Needs (11/19/2023)   PRAPARE - Administrator, Civil Service (Medical): No    Lack Rice Transportation (Non-Medical): No  Physical Activity: Inactive (11/19/2023)   Exercise Vital Sign    Days Rice Exercise per Week: 0 days    Minutes Rice Exercise per Session: 0 min  Stress: No Stress Concern Present (11/19/2023)   Teresa Rice  Occupational Health - Occupational Stress Questionnaire    Feeling Rice Stress : Not at all  Social Connections: Moderately Isolated (11/19/2023)   Social Connection and  Isolation Panel [NHANES]    Frequency Rice Communication with Friends and Family: More than three times a week    Frequency Rice Social Gatherings with Friends and Family: More than three times a week    Attends Religious Services: More than 4 times per year    Active Member Rice [employer] or Organizations: No    Attends Banker Meetings: Never    Marital Status: Widowed  Intimate Partner Violence: Not At Risk (11/19/2023)   Humiliation, Afraid, Rape, and Kick questionnaire    Fear Rice Current or Ex-Partner: No    Emotionally Abused: No    Physically Abused: No    Sexually Abused: No    Past Medical History, Surgical history, Social history, and Family history were reviewed and updated as appropriate.   2 new hearing aids.  Please see review Rice systems for further details on the patient's review from today.   Objective:   Physical Exam:  LMP  (LMP Unknown)   Physical Exam Constitutional:      General: She is not in acute distress. Musculoskeletal:        General: No deformity.  Neurological:     Mental Status: She is alert and oriented to person, place, and time.     Motor: No tremor.     Coordination: Coordination normal.     Gait: Gait normal.  Psychiatric:        Attention and Perception: Attention normal. She does not perceive auditory hallucinations.        Mood and Affect: Mood is anxious. Mood is not depressed. Affect is not labile, angry, tearful or inappropriate.        Speech: Speech normal.        Behavior: Behavior normal.        Thought Content: Thought content normal. Thought content is not delusional. Thought content does not include homicidal or suicidal ideation. Thought content does not include suicidal plan.        Cognition and Memory: Cognition normal.        Judgment: Judgment normal.     Comments: Insight fair.   No auditory or visual hallucinations. No delusions.  Reduced hearing but using hearing aid.      Lab Review:     Component Value  Date/Time   NA 141 03/02/2024 1359   NA 138 10/30/2022 1135   K 4.1 03/02/2024 1359   CL 108 03/02/2024 1359   CO2 24 03/02/2024 1359   GLUCOSE 127 (H) 03/02/2024 1359   BUN 22 03/02/2024 1359   BUN 20 10/30/2022 1135   CREATININE 0.77 03/02/2024 1359   CALCIUM  9.5 03/02/2024 1359   PROT 7.2 03/02/2024 1359   ALBUMIN 4.4 03/02/2024 1359   AST 15 03/02/2024 1359   ALT 17 03/02/2024 1359   ALKPHOS 87 03/02/2024 1359   BILITOT 0.5 03/02/2024 1359   GFRNONAA 87 (L) 05/24/2014 0620   GFRAA >90 05/24/2014 0620       Component Value Date/Time   WBC 6.7 03/02/2024 1359   RBC 5.42 (H) 03/02/2024 1359   HGB 13.7 03/02/2024 1359   HCT 42.2 03/02/2024 1359   PLT 291.0 03/02/2024 1359   MCV 77.8 (L) 03/02/2024 1359   MCH 26.2 05/24/2014 0620   MCHC 32.4 03/02/2024 1359   RDW 16.2 (H) 03/02/2024 1359   LYMPHSABS  1.7 03/02/2024 1359   MONOABS 0.5 03/02/2024 1359   EOSABS 0.2 03/02/2024 1359   BASOSABS 0.1 03/02/2024 1359    No results found for: POCLITH, LITHIUM   No results found for: PHENYTOIN, PHENOBARB, VALPROATE, CBMZ   .res Assessment: Plan:    Paulena was seen today for follow-up, anxiety and sleeping problem.  Diagnoses and all orders for this visit:  Panic disorder with agoraphobia -     ALPRAZolam  (XANAX ) 1 MG tablet; TAKE 1/2 (ONE-HALF) TABLET BY MOUTH THREE TIMES DAILY AND 1 TABLET AT BEDTIME  Major depressive disorder, recurrent episode, moderate (HCC)  PTSD (post-traumatic stress disorder) -     ALPRAZolam  (XANAX ) 1 MG tablet; TAKE 1/2 (ONE-HALF) TABLET BY MOUTH THREE TIMES DAILY AND 1 TABLET AT BEDTIME  Insomnia due to mental condition  Delayed sleep phase syndrome   30 min face to face time with patient was spent on counseling and coordination Rice care.   Less anxious and depressed with viibryd  without tremors she had with SSRIs.   Chronic avoidance and general fearfulness at baseline. Chronically easily stressed.  Dep managed.  No current panic  but had intermittently.  Overall anxiety is better with sertraline .    Chronically erratic sleep schedule and resistant to CBT for sleep bc never seems to understand the concepts.  Spending too much time in bed but she thinks it is necessary.  Does not think she can tolerate anxiety without Xanax .  A lot Rice benefit and no SE.  No balance px. We discussed the short-term risks associated with benzodiazepines including sedation and increased fall risk among others.  Discussed long-term side effect risk including dependence, potential withdrawal symptoms, and the potential eventual dose-related risk Rice dementia.  But recent studies from 2020 dispute this association between benzodiazepines and dementia risk. Newer studies in 2020 do not support an association with dementia.  Satisfied with Xanax  now..  Already at significant dose for age.  She has kept benefit.  Tolerated well.    Per her request increase alprazolam  1 mg 1/2 tab TID and 1 tab HS, per her request for sleep.  Don't change meds on her own..  Tends to want to reduce or stop SSRIs repeatedly and always gets worse.  Been more consistent.  She's frustrated taking so many meds chronically.  Disc relapse risk off meds.  Diarrhea resolved and overall satisfied with sertraline  50 mg daily.  Likely to need something LT DT age and LT sx.  Insight is lacking on this subject.  Encourage consistent  Counseling 20  min:  dealing with controlling daughter.  Also stress with son who won't contact her.  Disc picking small issues to establish her agency.  They try to tell her what to do with her money.  She manages her own money.    FU 3 mos  Nori Beat, MD, DFAPA   Please see After Visit Summary for patient specific instructions.  Future Appointments  Date Time Provider Department Center  06/01/2024  1:30 PM Christel Cousins, MD [facility]  06/15/2024 11:20 AM Clearnce Curia, NP [facility]  11/25/2024 11:20 AM [facility]  VISIT 1 [facility]    No orders Rice the defined types were placed in this encounter.     -------------------------------

## 2024-05-20 ENCOUNTER — Other Ambulatory Visit: Payer: Self-pay | Admitting: Family Medicine

## 2024-05-21 DIAGNOSIS — H905 Unspecified sensorineural hearing loss: Secondary | ICD-10-CM | POA: Diagnosis not present

## 2024-06-01 ENCOUNTER — Ambulatory Visit (INDEPENDENT_AMBULATORY_CARE_PROVIDER_SITE_OTHER)

## 2024-06-01 ENCOUNTER — Ambulatory Visit (INDEPENDENT_AMBULATORY_CARE_PROVIDER_SITE_OTHER): Admitting: Family Medicine

## 2024-06-01 ENCOUNTER — Ambulatory Visit: Payer: Self-pay | Admitting: Family Medicine

## 2024-06-01 ENCOUNTER — Encounter: Payer: Self-pay | Admitting: Family Medicine

## 2024-06-01 VITALS — BP 122/78 | HR 92 | Temp 97.8°F | Resp 18 | Ht 63.0 in | Wt 162.4 lb

## 2024-06-01 DIAGNOSIS — R059 Cough, unspecified: Secondary | ICD-10-CM | POA: Diagnosis not present

## 2024-06-01 DIAGNOSIS — R918 Other nonspecific abnormal finding of lung field: Secondary | ICD-10-CM | POA: Diagnosis not present

## 2024-06-01 DIAGNOSIS — E1159 Type 2 diabetes mellitus with other circulatory complications: Secondary | ICD-10-CM

## 2024-06-01 DIAGNOSIS — R002 Palpitations: Secondary | ICD-10-CM | POA: Diagnosis not present

## 2024-06-01 DIAGNOSIS — E1169 Type 2 diabetes mellitus with other specified complication: Secondary | ICD-10-CM | POA: Diagnosis not present

## 2024-06-01 DIAGNOSIS — J40 Bronchitis, not specified as acute or chronic: Secondary | ICD-10-CM | POA: Diagnosis not present

## 2024-06-01 DIAGNOSIS — J4540 Moderate persistent asthma, uncomplicated: Secondary | ICD-10-CM | POA: Diagnosis not present

## 2024-06-01 DIAGNOSIS — K219 Gastro-esophageal reflux disease without esophagitis: Secondary | ICD-10-CM | POA: Diagnosis not present

## 2024-06-01 DIAGNOSIS — E119 Type 2 diabetes mellitus without complications: Secondary | ICD-10-CM | POA: Diagnosis not present

## 2024-06-01 DIAGNOSIS — R600 Localized edema: Secondary | ICD-10-CM

## 2024-06-01 DIAGNOSIS — R053 Chronic cough: Secondary | ICD-10-CM | POA: Diagnosis not present

## 2024-06-01 DIAGNOSIS — J984 Other disorders of lung: Secondary | ICD-10-CM | POA: Diagnosis not present

## 2024-06-01 DIAGNOSIS — Z794 Long term (current) use of insulin: Secondary | ICD-10-CM | POA: Diagnosis not present

## 2024-06-01 DIAGNOSIS — I152 Hypertension secondary to endocrine disorders: Secondary | ICD-10-CM

## 2024-06-01 DIAGNOSIS — E039 Hypothyroidism, unspecified: Secondary | ICD-10-CM | POA: Diagnosis not present

## 2024-06-01 DIAGNOSIS — E785 Hyperlipidemia, unspecified: Secondary | ICD-10-CM | POA: Diagnosis not present

## 2024-06-01 MED ORDER — LANTUS SOLOSTAR 100 UNIT/ML ~~LOC~~ SOPN: 40.0000 [IU] | PEN_INJECTOR | Freq: Every day | SUBCUTANEOUS | 3 refills | Status: AC

## 2024-06-01 MED ORDER — OMEPRAZOLE 20 MG PO CPDR: 20.0000 mg | DELAYED_RELEASE_CAPSULE | Freq: Two times a day (BID) | ORAL | 1 refills | Status: DC

## 2024-06-01 MED ORDER — POTASSIUM CHLORIDE CRYS ER 10 MEQ PO TBCR: 10.0000 meq | EXTENDED_RELEASE_TABLET | Freq: Two times a day (BID) | ORAL | 1 refills | Status: AC

## 2024-06-01 MED ORDER — FREESTYLE LIBRE 3 PLUS SENSOR MISC: 1.0000 | 3 refills | Status: DC

## 2024-06-01 MED ORDER — ALBUTEROL SULFATE HFA 108 (90 BASE) MCG/ACT IN AERS: INHALATION_SPRAY | RESPIRATORY_TRACT | 1 refills | Status: DC

## 2024-06-01 MED ORDER — FENOFIBRATE 160 MG PO TABS: 160.0000 mg | ORAL_TABLET | Freq: Every day | ORAL | 1 refills | Status: DC

## 2024-06-01 MED ORDER — FUROSEMIDE 20 MG PO TABS: 20.0000 mg | ORAL_TABLET | Freq: Two times a day (BID) | ORAL | 1 refills | Status: AC

## 2024-06-01 MED ORDER — EMPAGLIFLOZIN 10 MG PO TABS: 10.0000 mg | ORAL_TABLET | Freq: Every day | ORAL | 1 refills | Status: DC

## 2024-06-01 NOTE — Patient Instructions (Signed)
 It was very nice to see you today!  Use albuterol  every 4-6 hrs as needed-coughing, sob, before going out in heat   PLEASE NOTE:  If you had any lab tests please let us  know if you have not heard back within a few days. You may see your results on MyChart before we have a chance to review them but we will give you a call once they are reviewed by us . If we ordered any referrals today, please let us  know if you have not heard from their office within the next week.   Please try these tips to maintain a healthy lifestyle:  Eat most of your calories during the day when you are active. Eliminate processed foods including packaged sweets (pies, cakes, cookies), reduce intake of potatoes, white bread, white pasta, and white rice. Look for whole grain options, oat flour or almond flour.  Each meal should contain half fruits/vegetables, one quarter protein, and one quarter carbs (no bigger than a computer mouse).  Cut down on sweet beverages. This includes juice, soda, and sweet tea. Also watch fruit intake, though this is a healthier sweet option, it still contains natural sugar! Limit to 3 servings daily.  Drink at least 1 glass of water  with each meal and aim for at least 8 glasses per day  Exercise at least 150 minutes every week.

## 2024-06-01 NOTE — Progress Notes (Signed)
 Subjective:     Patient ID: Teresa Rice, female    DOB: 1946-01-09, 78 y.o.   MRN: [id number]  Chief Complaint  Patient presents with   Medical Management of Chronic Issues    3 month follow-up     HPI Discussed the use of AI scribe software for clinical note transcription with the patient, who gave verbal consent to proceed.  History of Present Illness Teresa Rice is a 78 year old female with hypertension and asthma who presents with a persistent cough and medication management.  She has been experiencing a persistent cough that has been on and off for several months. Initially, she had a sore throat followed by a cough, which resolved after treatment with a Z-Pak. However, the cough returned after three weeks. She describes the cough as congestion-related, worsening when she is outside and improving indoors. Occasionally, she experiences wheezing, as noted by her daughter, and sometimes produces green or clear sputum. She uses Symbicort  twice daily for asthma management. Occ albuterol   Her hypertension is managed with diltiazem  240 mg, valsartan  80 mg, and metoprolol  100 mg. She mentions that her metoprolol  dosage was recently reduced. She regularly monitors her blood pressure and blood sugar levels, noting occasional discrepancies in her readings. Sees Cardiology.  Her blood sugar levels range from 90 to 150 mg/dL in the mornings, with a few outlier readings.  For diabetes management, she is on Jardiance  10 mg, Lantus  38 units, metformin  1000 mg once daily, and Ozempic  2 mg weekly. She reports occasional constipation, which is relieved by her protein drinks with decaf coffee.  She also takes omeprazole  twice daily for stomach issues.  She has a history of mild intermittent asthma diagnosed in 1996 after quitting smoking. She uses Symbicort  twice daily and has an albuterol  rescue inhaler for acute symptoms. No COPD.  Her mood is stable, and she is currently taking sertraline   50 mg and Xanax  0.5 mg three times a day for anxiety and depression. No suicidal thoughts and she continues to see a psychiatrist.  Her dietary habits include consuming Atkins protein drinks and bars, and occasionally indulging in foods like watermelon and meatball sandwiches. She acknowledges the need to incorporate more vegetables into her diet.    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   Mild intermittent asthma    OA (osteoarthritis)    fingers    Palpitations 09/04/2022   Trigger finger of both hands    wear slints and special gloves at night   Type 2 diabetes mellitus [facility])     Past Surgical History:  Procedure Laterality Date   BLADDER SURGERY  2012   in [place]   TURBT   CHOLECYSTECTOMY  11/26/1968   CYSTOSCOPY/RETROGRADE/URETEROSCOPY Left 05/24/2014   Procedure: CYSTOSCOPY/RETROGRADE/ URETEROSCOPY;  Surgeon: Garnette CHRISTELLA matilda, MD;  Location: [facility];  Service: Urology;  Laterality: Left;   TRANSURETHRAL RESECTION OF BLADDER TUMOR N/A 05/24/2014   Procedure: TRANSURETHRAL RESECTION OF BLADDER TUMOR (TURBT) ;  Surgeon: Garnette CHRISTELLA matilda, MD;  Location: [facility];  Service: Urology;  Laterality: N/A;   TRANSURETHRAL RESECTION OF BLADDER TUMOR N/A 08/04/2015   Procedure: TRANSURETHRAL RESECTION OF BLADDER TUMOR (TURBT);  Surgeon: Garnette Shack, MD;  Location: [facility];  Service: Urology;  Laterality: N/A;   WRIST SURGERY Right    x3     Current Outpatient Medications:    Accu-Chek Softclix Lancets lancets, , Disp: , Rfl:    ALPRAZolam  (XANAX ) 1 MG tablet, TAKE 1/2  (ONE-HALF) TABLET BY MOUTH THREE TIMES DAILY AND 1 TABLET AT BEDTIME, Disp: 75 tablet, Rfl: 2   B-D UF III MINI PEN NEEDLES 31G X 5 MM MISC, SMARTSIG:1 Pen Needle SUB-Q Daily, Disp: 100 each, Rfl: 3   baclofen  (LIORESAL ) 10 MG tablet, Take 0.5 tablets (5 mg total) by mouth 2 (two) times daily as needed for muscle spasms., Disp: 30 each, Rfl: 0   blood glucose meter kit and supplies, Dispense based on patient and insurance preference. Use up to four times daily as directed. (FOR ICD-10 E10.9, E11.9)., Disp: 1 each, Rfl: 0   Calcium  Carbonate (CALCIUM  600 PO), Take 1 tablet by mouth daily in the afternoon., Disp: , Rfl:    cholecalciferol (VITAMIN D3) 25 MCG (1000 UNIT) tablet, Take 1,000 Units by mouth daily., Disp: , Rfl:    Continuous Glucose Sensor (FREESTYLE LIBRE 3 PLUS SENSOR) MISC, Inject 1 each into the skin every 14 (fourteen) days. Change sensor every 15 days., Disp: 6 each, Rfl: 3   diltiazem  (CARTIA  XT) 240 MG 24 hr capsule, Take 1 capsule by mouth once daily, Disp: 90 capsule, Rfl: 2   fluticasone  (FLONASE ) 50 MCG/ACT nasal spray, Place into both nostrils daily., Disp: , Rfl:    glucose blood test strip, Use as instructed, Disp: 100 each, Rfl: 12   guaiFENesin -codeine  100-10 MG/5ML syrup, Take 5 mLs by mouth every 6 (six) hours as needed for cough., Disp: 118 mL, Rfl: 0   ipratropium (ATROVENT ) 0.06 % nasal spray, Place 2 sprays into both nostrils every 6 (six) hours as needed for rhinitis., Disp: 15 mL, Rfl: 12   levothyroxine  (SYNTHROID ) 25 MCG tablet, TAKE 1 TABLET BY MOUTH ONCE DAILY FOR THYROID , Disp: 90 tablet, Rfl: 3   magnesium oxide (MAG-OX) 400 MG tablet, Take 400 mg by mouth 2 (two) times daily., Disp: , Rfl:    metFORMIN  (GLUCOPHAGE ) 1000 MG tablet, TAKE 1 TABLET BY MOUTH TWICE DAILY WITH A MEAL, Disp: 180 tablet, Rfl: 1   metoprolol  succinate (TOPROL  XL) 100 MG 24 hr tablet, Take 1 tablet (100 mg total) by mouth daily. Take with or immediately following a meal., Disp: 90  tablet, Rfl: 2   Multiple Vitamin (MULTIVITAMIN WITH MINERALS) TABS tablet, Take 1 tablet by mouth daily., Disp: , Rfl:    nystatin cream (MYCOSTATIN), Apply topically., Disp: , Rfl:    Omega 3 1000 MG CAPS, Take 1,000 mg by mouth daily., Disp: , Rfl:    OZEMPIC , 2 MG/DOSE, 8 MG/3ML SOPN, Inject 2 mg into the skin once a week., Disp: 9 mL, Rfl: 3   Polyethylene Glycol 3350 (MIRALAX PO), Take by mouth every evening., Disp: , Rfl:    rosuvastatin  (CRESTOR ) 10 MG tablet, Take 1 tablet by mouth once daily, Disp: 90 tablet, Rfl: 1   sertraline  (ZOLOFT ) 50 MG tablet, Take 1 tablet (50 mg total) by mouth daily., Disp: 90 tablet, Rfl: 0   SYMBICORT  160-4.5 MCG/ACT inhaler, Inhale 2 puffs into the lungs in the morning and at bedtime., Disp: 33 g, Rfl: 3   traMADol  (ULTRAM )  50 MG tablet, Take 1 tablet by mouth every 6 (six) hours as needed., Disp: , Rfl:    triamcinolone cream (KENALOG) 0.1 %, Apply topically 3 (three) times daily., Disp: , Rfl:    valsartan  (DIOVAN ) 80 MG tablet, Take 1 tablet by mouth once daily, Disp: 90 tablet, Rfl: 1   VITAMIN E PO, Take by mouth., Disp: , Rfl:    albuterol  (VENTOLIN  HFA) 108 (90 Base) MCG/ACT inhaler, INHALE 2 PUFFS BY MOUTH EVERY 6 HOURS AS NEEDED FOR WHEEZING OR SHORTNESS OF BREATH, Disp: 2 each, Rfl: 1   empagliflozin  (JARDIANCE ) 10 MG TABS tablet, Take 1 tablet (10 mg total) by mouth daily before breakfast., Disp: 90 tablet, Rfl: 1   fenofibrate  160 MG tablet, Take 1 tablet (160 mg total) by mouth daily., Disp: 90 tablet, Rfl: 1   furosemide  (LASIX ) 20 MG tablet, Take 1 tablet (20 mg total) by mouth 2 (two) times daily., Disp: 180 tablet, Rfl: 1   LANTUS  SOLOSTAR 100 UNIT/ML Solostar Pen, Inject 40 Units into the skin daily., Disp: 45 mL, Rfl: 3   omeprazole  (PRILOSEC) 20 MG capsule, Take 1 capsule (20 mg total) by mouth 2 (two) times daily before a meal., Disp: 180 capsule, Rfl: 1   potassium chloride  (KLOR-CON  M) 10 MEQ tablet, Take 1 tablet (10 mEq total) by  mouth 2 (two) times daily., Disp: 180 tablet, Rfl: 1  Allergies  Allergen Reactions   Celebrex [Celecoxib] Anaphylaxis   Glipizide Itching   Penicillins Other (See Comments)    Burn marks from inside out    Sulfa Antibiotics Itching   ROS neg/noncontributory except as noted HPI/below      Objective:     BP 122/78 (BP Location: Left Arm, Patient Position: Sitting, Cuff Size: Normal)   Pulse 92   Temp 97.8 F (36.6 C) (Temporal)   Resp 18   Ht 5' 3 (1.6 m)   Wt 162 lb 6 oz (73.7 kg)   LMP  (LMP Unknown)   SpO2 95%   BMI 28.76 kg/m  Wt Readings from Last 3 Encounters:  06/01/24 162 lb 6 oz (73.7 kg)  05/04/24 158 lb (71.7 kg)  03/02/24 161 lb 4 oz (73.1 kg)    Physical Exam   Gen: WDWN NAD HEENT: NCAT, conjunctiva not injected, sclera nonicteric NECK:  supple, no thyromegaly, no nodes, no carotid bruits CARDIAC: RRR, S1S2+, no murmur. DP 2+B LUNGS: CTAB. No wheezes ABDOMEN:  BS+, soft, NTND, No HSM, no masses EXT:  no edema MSK: no gross abnormalities.  NEURO: A&O x3.  CN II-XII intact.  PSYCH: normal mood. Good eye contact  45 min w/new w/u for cough/asthma, and chronic illnesses and educating pt on inhaler regimen    Assessment & Plan:  Type 2 diabetes mellitus with insulin therapy (HCC) -     FreeStyle Libre 3 Plus Sensor; Inject 1 each into the skin every 14 (fourteen) days. Change sensor every 15 days.  Dispense: 6 each; Refill: 3 -     Empagliflozin ; Take 1 tablet (10 mg total) by mouth daily before breakfast.  Dispense: 90 tablet; Refill: 1 -     Lantus  SoloStar; Inject 40 Units into the skin daily.  Dispense: 45 mL; Refill: 3  Acquired hypothyroidism  Hyperlipidemia associated with type 2 diabetes mellitus (HCC) -     Fenofibrate ; Take 1 tablet (160 mg total) by mouth daily.  Dispense: 90 tablet; Refill: 1  Palpitations  Hypertension associated with diabetes (HCC)  Moderate persistent asthma without  complication -     Albuterol  Sulfate HFA;  INHALE 2 PUFFS BY MOUTH EVERY 6 HOURS AS NEEDED FOR WHEEZING OR SHORTNESS OF BREATH  Dispense: 2 each; Refill: 1  Chronic cough -     DG Chest 2 View; Future  Localized edema -     Furosemide ; Take 1 tablet (20 mg total) by mouth 2 (two) times daily.  Dispense: 180 tablet; Refill: 1 -     Potassium Chloride  Crys ER; Take 1 tablet (10 mEq total) by mouth 2 (two) times daily.  Dispense: 180 tablet; Refill: 1  Gastroesophageal reflux disease without esophagitis -     Omeprazole ; Take 1 capsule (20 mg total) by mouth 2 (two) times daily before a meal.  Dispense: 180 capsule; Refill: 1  Assessment and Plan Assessment & Plan Chronic Cough   She experiences an intermittent cough with congestion, recurring after initial improvement with azithromycin . Symptoms worsen outdoors, suggesting an environmental trigger. Differential diagnosis includes asthma exacerbation, COPD, or recurrent infection. Wheezing and occasional green sputum indicate possible infection or asthma exacerbation. Order a chest x-ray and consider refer to a pulmonologist for further evaluation. Educate on using an albuterol  inhaler before going outside and during coughing spells.  Asthma   Mild intermittent asthma, diagnosed in 1996, is currently managed with Symbicort  twice daily. No recent exacerbations have required albuterol . She reports rare dyspnea episodes, managed with albuterol . Continue Symbicort  twice daily and use the albuterol  inhaler as needed for acute symptoms and before exposure to potential triggers.  Hypertension   Her hypertension is managed with diltiazem , valsartan , and metoprolol , with a recent reduction in metoprolol  dosage. She is advised to maintain consistent medication timing and consider setting a reminder for adherence. Continue the current antihypertensive regimen, set a consistent time for taking medication, and consider setting a reminder for medication adherence.  Type 2 Diabetes Mellitus   Blood  glucose levels are generally well-controlled with occasional outliers. The current regimen includes Jardiance , Lantus , metformin , and Ozempic . She monitors blood glucose regularly and reports dietary compliance with occasional indulgences. Discussed switching to [facility] 3 for continuous glucose monitoring due to the discontinuation of Libre 2. Continue the current diabetes medication regimen, monitor blood glucose levels regularly, and switch to Libre 3 for continuous glucose monitoring.  Hyperlipidemia   Her hyperlipidemia is managed with rosuvastatin  and fenofibrate . Continue the current lipid-lowering therapy.  Hypothyroidism   Hypothyroidism is managed with levothyroxine  25 mcg daily, adhering to medication timing guidelines. Continue the current thyroid  medication regimen.  Depression and Anxiety   Depression and anxiety are managed with sertraline  and Xanax . She reports well-managed mood and no suicidal ideation, continuing regular psychiatric follow-up. Continue sertraline  50 mg daily and Xanax  as prescribed.    Return in about 3 months (around 09/01/2024) for chronic follow-up.  Jenkins CHRISTELLA Carrel, MD

## 2024-06-01 NOTE — Progress Notes (Signed)
 May be asthma or some pneumonia developing.  Send in zpak and repeat cxr in 3-4 wks

## 2024-06-02 ENCOUNTER — Other Ambulatory Visit: Payer: Self-pay | Admitting: *Deleted

## 2024-06-02 DIAGNOSIS — R053 Chronic cough: Secondary | ICD-10-CM

## 2024-06-02 MED ORDER — AZITHROMYCIN 250 MG PO TABS: ORAL_TABLET | ORAL | 0 refills | Status: AC

## 2024-06-03 DIAGNOSIS — Z8551 Personal history of malignant neoplasm of bladder: Secondary | ICD-10-CM | POA: Diagnosis not present

## 2024-06-04 ENCOUNTER — Other Ambulatory Visit: Payer: Self-pay | Admitting: Family Medicine

## 2024-06-04 ENCOUNTER — Other Ambulatory Visit

## 2024-06-04 DIAGNOSIS — M67442 Ganglion, left hand: Secondary | ICD-10-CM | POA: Diagnosis not present

## 2024-06-08 ENCOUNTER — Ambulatory Visit (INDEPENDENT_AMBULATORY_CARE_PROVIDER_SITE_OTHER)

## 2024-06-08 DIAGNOSIS — R059 Cough, unspecified: Secondary | ICD-10-CM | POA: Diagnosis not present

## 2024-06-08 DIAGNOSIS — R053 Chronic cough: Secondary | ICD-10-CM

## 2024-06-10 ENCOUNTER — Ambulatory Visit: Payer: Self-pay | Admitting: Family Medicine

## 2024-06-10 NOTE — Progress Notes (Signed)
 Chest xray ok.  If cough continues, may need to do CT

## 2024-06-12 ENCOUNTER — Encounter (HOSPITAL_BASED_OUTPATIENT_CLINIC_OR_DEPARTMENT_OTHER): Payer: Self-pay

## 2024-06-15 ENCOUNTER — Telehealth: Payer: Self-pay

## 2024-06-15 ENCOUNTER — Other Ambulatory Visit: Payer: Self-pay | Admitting: Family Medicine

## 2024-06-15 ENCOUNTER — Encounter (HOSPITAL_BASED_OUTPATIENT_CLINIC_OR_DEPARTMENT_OTHER): Payer: Self-pay | Admitting: Family

## 2024-06-15 ENCOUNTER — Ambulatory Visit (HOSPITAL_BASED_OUTPATIENT_CLINIC_OR_DEPARTMENT_OTHER): Admitting: Family

## 2024-06-15 VITALS — BP 130/64 | HR 87 | Ht 63.0 in | Wt 165.0 lb

## 2024-06-15 DIAGNOSIS — I10 Essential (primary) hypertension: Secondary | ICD-10-CM | POA: Diagnosis not present

## 2024-06-15 DIAGNOSIS — Z01810 Encounter for preprocedural cardiovascular examination: Secondary | ICD-10-CM

## 2024-06-15 DIAGNOSIS — E782 Mixed hyperlipidemia: Secondary | ICD-10-CM

## 2024-06-15 DIAGNOSIS — R002 Palpitations: Secondary | ICD-10-CM

## 2024-06-15 NOTE — Progress Notes (Signed)
 Cardiology Office Note:  .   Date:  06/15/2024  ID:  Teresa Rice, DOB 1946-06-21, MRN [id number] PCP: Teresa Jenkins Jansky, MD  [facility] Providers Cardiologist:  Annabella Scarce, MD    History of Present Illness: .   Teresa Rice is a 78 y.o. female with a hx of tachycardia, HTN, anxiety, Dm2 last seen 03/11/23.   Saw PCP 06/2022 noting palpitations with EKG revealing PACs.  At follow-up later that month metoprolol  was increased to 75 mg and she was referred to cardiology.   Established with Dr. Scarce 09/04/2022.Metoprolol  was stopped and she was started on diltiazem  240 mg daily.  7-day ZIO ordered with preliminary report with NSR and rare PVC/PAC <1% burden with no significant arrhythmia.Echo 09/19/22 with normal LVEF 55-60%, no RWMA, mild asymmetric LVH   At visit 10/17/2023 losartan  was stopped and transition to valsartan  160 mg daily for improved blood pressure control.  Home sleep study was ordered but not performed.   At visit 01/07/2023 Metoprolol  succinate 25 mg daily added for improved heart rate control. At visit 03/11/23 increased to Toprol  50mg  daily for palpitations. At visit 05/2023 Metoprolol  succinate was further increased to 100mg  for palpitations.   Presents today for follow up independently. Chief concern of cough. Notes her inhaler helps if she is in the house she does not cough as much. She is concerned it may be something she is eating, but is uncertain. This cough has been ongoing since an illness in May while on vacation. Her recent CXR was unremarkable. She notes she would not want to do a CT scan as does not feels she could hold her breath long enough for the scan. She notes her daughter stats she is wheezing at night. She completed course of abx without much improvement. She notes phlegm which is green and then sometimes clear. Encouraged to continue following with primary care provider.   Reports no shortness of breath nor dyspnea on exertion. Reports no  chest pain, pressure, or tightness. No edema, orthopnea, PND. Reports no palpitations.  She gets occasional chest tightness when feeling  anxious which resolves either independently or with her Xanax . No exertional chest discomfort.   Reports BP at home has been labile but averages <130/80. Reassurance provided that diastolic BP in 60s was okay.   She is pending surgery for removal of mucous cyst of digit of left hand and requests clearance.   ROS: Please see the history of present illness.    All other systems reviewed and are negative.   Studies Reviewed: SABRA   EKG Interpretation Date/Time:  Monday June 15 2024 11:39:19 EDT Ventricular Rate:  84 PR Interval:  186 QRS Duration:  106 QT Interval:  374 QTC Calculation: 441 R Axis:   56  Text Interpretation: Normal sinus rhythm Incomplete right bundle branch block  No acute ST/T wave changes. Confirmed by Vannie Mora ([id number]) on 06/15/2024 11:58:17 AM       Risk Assessment/Calculations:         STOP-Bang Score:         Physical Exam:   VS:  BP 130/64 (BP Location: Left Arm, Patient Position: Sitting, Cuff Size: Normal)   Pulse 87   Ht 5' 3 (1.6 m)   Wt 165 lb (74.8 kg)   LMP  (LMP Unknown)   SpO2 98%   BMI 29.23 kg/m    Wt Readings from Last 3 Encounters:  06/15/24 165 lb (74.8 kg)  06/01/24 162 lb 6 oz (73.7  kg)  05/04/24 158 lb (71.7 kg)    GEN: Well nourished, well developed in no acute distress NECK: No JVD; No carotid bruits CARDIAC: RRR, no murmurs, rubs, gallops RESPIRATORY:  Clear to auscultation without rales, wheezing or rhonchi  ABDOMEN: Soft, non-tender, non-distended EXTREMITIES:  No edema; No deformity   ASSESSMENT AND PLAN: .    Preop clearance - According to the Revised Cardiac Risk Index (RCRI), her Perioperative Risk of Major Cardiac Event is (%): 0.4. Her Functional Capacity in METs is: 5.07 according to the Duke Activity Status Index (DASI). Per AHA/ACC guidelines, she is deemed acceptable risk for  the planned procedure without additional cardiovascular testing. Will route to surgical team so they are aware.  She is not on any antiplatelets nor anticoagulants that need to be held   HTN -  BP at goal of <130/80. Continue Diltiazem  240mg , Valsartan  80mg , Toprol  100mg  daily. Discussed to monitor BP at home at least 2 hours after medications and sitting for 5-10 minutes.   Chronic cough - Following with PCP. Recent CXR unremarkable. Lung sounds clear on exam. Hesitant about CT scan as feels she cannot hold her breath that long. Encouraged to follow up with primary care   LE edema -   Echo 08/2022 normal LVEF.  Likely etiology venous insufficiency. Continue Lasix  20mg  BID. Encouraged low salt diet, leg elevation, compressions tockings if tolerated.    Inappropriate sinus tachycardia / Palpitations - Prior monitor 09/2022 with inappropriate sinus tachycardia. Palpitations only during times of stress. Continue Toprol  100mg  every day, Diltiazem  240mg  daily. Encouraged to continue to avoid caffeine, stay well hydrated.    Snores - STOP Bang 5. Sleep study previously ordered. However, since then sleep has been better and she has politely declined sleep study.    DM2 - Continue to follow with PCP. 02/2024 A1c 6.8. Appreciate inclusion of Jardiance  for cardioprotective benefit.   HLD - 02/2024 LDL 49. Continue Rosuvastatin  10mg  daily, Fenofibrate  160mg  daily.       Dispo: follow up in 1 year  Signed, Reche GORMAN Finder, NP

## 2024-06-15 NOTE — Patient Instructions (Addendum)
 Medication Instructions:   Your physician recommends that you continue on your current medications as directed. Please refer to the Current Medication list given to you today.   *If you need a refill on your cardiac medications before your next appointment, please call your pharmacy*  Lab Work:  None ordered.  If you have labs (blood work) drawn today and your tests are completely normal, you will receive your results only by: MyChart Message (if you have MyChart) OR A paper copy in the mail If you have any lab test that is abnormal or we need to change your treatment, we will call you to review the results.  Testing/Procedures:  None ordered.  Follow-Up: At [facility], you and your health needs are our priority.  As part of our continuing mission to provide you with exceptional heart care, our providers are all part of one team.  This team includes your primary Cardiologist (physician) and Advanced Practice Providers or APPs (Physician Assistants and Nurse Practitioners) who all work together to provide you with the care you need, when you need it.  Your next appointment:   1 year(s)  Provider:   Annabella Scarce, MD or Reche Finder, NP    We recommend signing up for the patient portal called MyChart.  Sign up information is provided on this After Visit Summary.  MyChart is used to connect with patients for Virtual Visits (Telemedicine).  Patients are able to view lab/test results, encounter notes, upcoming appointments, etc.  Non-urgent messages can be sent to your provider as well.   To learn more about what you can do with MyChart, go to [email or web address].   Other Instructions  Your physician wants you to follow-up in: 1 year.  You will receive a reminder letter in the mail two months in advance. If you don't receive a letter, please call our office to schedule the follow-up appointment.      We will let your surgical team know you are good for  surgery!  Our goal is for your average blood pressure to be less than 130/80 We want your top number 110-135 with an average of less than 130 We want your bottom number to be less than 80  Your blood pressure will naturally fluctuate throughout the day. To get the most accurate measurement, be sure to rest for 5-10 minutes before checking your blood pressure.   If you ever get a high number at a doctor's office you can ask them to recheck it.   Tips to Measure your Blood Pressure Correctly  Here's what you can do to ensure a correct reading:  Don't drink a caffeinated beverage or smoke during the 30 minutes before the test.  Sit quietly for five minutes before the test begins.  During the measurement, sit in a chair with your feet on the floor and your arm supported so your elbow is at about heart level.  The inflatable part of the cuff should completely cover at least 80% of your upper arm, and the cuff should be placed on bare skin, not over a shirt.  Don't talk during the measurement.  Have your blood pressure measured twice, with a brief break in between. If the readings are different by 5 points or more, have it done a third time.

## 2024-06-15 NOTE — Telephone Encounter (Signed)
 Patient cleared in note from OV with Reche Finder, NP on 06/15/24. Note faxed to requesting provider with confirmation received.

## 2024-06-15 NOTE — Telephone Encounter (Signed)
   Pre-operative Risk Assessment    Patient Name: Teresa Rice  DOB: 1946/10/01 MRN: [id number]   Date of last office visit: 06/15/24 CAITLIN WALKER, NP Date of next office visit: NONE   Request for Surgical Clearance    Procedure:  EXCISION OF LEFT MIDDLE FINGER MUCOUS CYST  Date of Surgery:  Clearance TBD                                Surgeon:  OZELL HOCK, MD Surgeon's Group or Practice Name:  [facility] Phone number:  [phone] Fax number:  [phone]   Type of Clearance Requested:   - Medical    Type of Anesthesia:  General    Additional requests/questions:    SignedLucie DELENA Ku   06/15/2024, 1:08 PM

## 2024-06-22 ENCOUNTER — Telehealth: Payer: Self-pay | Admitting: Family Medicine

## 2024-06-22 ENCOUNTER — Other Ambulatory Visit: Payer: Self-pay | Admitting: *Deleted

## 2024-06-22 DIAGNOSIS — Z794 Long term (current) use of insulin: Secondary | ICD-10-CM

## 2024-06-22 MED ORDER — FREESTYLE LIBRE 3 PLUS SENSOR MISC
1.0000 | 3 refills | Status: AC
Start: 2024-06-22 — End: ?

## 2024-06-22 NOTE — Telephone Encounter (Signed)
 Patient came in office stating she has the freestyle libre 2 monitor but her current sensor is not syncing up to it. Patient informed me she needs a freestyle libre 3 plus sensor called in so she can check sugars.

## 2024-06-22 NOTE — Telephone Encounter (Signed)
 Rx sent.

## 2024-07-15 DIAGNOSIS — E039 Hypothyroidism, unspecified: Secondary | ICD-10-CM | POA: Diagnosis not present

## 2024-07-15 DIAGNOSIS — L720 Epidermal cyst: Secondary | ICD-10-CM | POA: Diagnosis not present

## 2024-07-20 ENCOUNTER — Other Ambulatory Visit: Payer: Self-pay | Admitting: Family Medicine

## 2024-07-20 DIAGNOSIS — E119 Type 2 diabetes mellitus without complications: Secondary | ICD-10-CM

## 2024-07-23 ENCOUNTER — Other Ambulatory Visit: Payer: 59

## 2024-07-25 ENCOUNTER — Other Ambulatory Visit: Payer: Self-pay | Admitting: Psychiatry

## 2024-07-25 DIAGNOSIS — F4310 Post-traumatic stress disorder, unspecified: Secondary | ICD-10-CM

## 2024-07-25 DIAGNOSIS — F331 Major depressive disorder, recurrent, moderate: Secondary | ICD-10-CM

## 2024-07-25 DIAGNOSIS — F4001 Agoraphobia with panic disorder: Secondary | ICD-10-CM

## 2024-07-28 ENCOUNTER — Encounter: Payer: Self-pay | Admitting: Psychiatry

## 2024-07-28 ENCOUNTER — Ambulatory Visit (INDEPENDENT_AMBULATORY_CARE_PROVIDER_SITE_OTHER): Admitting: Psychiatry

## 2024-07-28 DIAGNOSIS — G4721 Circadian rhythm sleep disorder, delayed sleep phase type: Secondary | ICD-10-CM

## 2024-07-28 DIAGNOSIS — F4310 Post-traumatic stress disorder, unspecified: Secondary | ICD-10-CM

## 2024-07-28 DIAGNOSIS — F331 Major depressive disorder, recurrent, moderate: Secondary | ICD-10-CM | POA: Diagnosis not present

## 2024-07-28 DIAGNOSIS — F5105 Insomnia due to other mental disorder: Secondary | ICD-10-CM | POA: Diagnosis not present

## 2024-07-28 DIAGNOSIS — F4001 Agoraphobia with panic disorder: Secondary | ICD-10-CM

## 2024-07-28 MED ORDER — SERTRALINE HCL 50 MG PO TABS: 50.0000 mg | ORAL_TABLET | Freq: Every day | ORAL | 2 refills | Status: AC

## 2024-07-28 MED ORDER — ALPRAZOLAM 1 MG PO TABS: ORAL_TABLET | ORAL | 5 refills | Status: AC
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
## 2024-07-30 DIAGNOSIS — M67442 Ganglion, left hand: Secondary | ICD-10-CM | POA: Diagnosis not present

## 2024-08-06 ENCOUNTER — Ambulatory Visit: Admitting: Psychiatry

## 2024-08-31 ENCOUNTER — Other Ambulatory Visit: Payer: Self-pay | Admitting: Family Medicine

## 2024-08-31 DIAGNOSIS — J4540 Moderate persistent asthma, uncomplicated: Secondary | ICD-10-CM

## 2024-09-08 ENCOUNTER — Other Ambulatory Visit: Payer: Self-pay | Admitting: Family Medicine

## 2024-09-09 ENCOUNTER — Encounter: Payer: Self-pay | Admitting: Family Medicine

## 2024-09-09 ENCOUNTER — Ambulatory Visit: Payer: Self-pay | Admitting: Family Medicine

## 2024-09-09 ENCOUNTER — Ambulatory Visit: Admitting: Family Medicine

## 2024-09-09 VITALS — BP 134/62 | HR 80 | Temp 97.5°F | Ht 63.0 in | Wt 167.0 lb

## 2024-09-09 DIAGNOSIS — Z79899 Other long term (current) drug therapy: Secondary | ICD-10-CM

## 2024-09-09 DIAGNOSIS — Z7985 Long-term (current) use of injectable non-insulin antidiabetic drugs: Secondary | ICD-10-CM

## 2024-09-09 DIAGNOSIS — E119 Type 2 diabetes mellitus without complications: Secondary | ICD-10-CM | POA: Diagnosis not present

## 2024-09-09 DIAGNOSIS — Z794 Long term (current) use of insulin: Secondary | ICD-10-CM

## 2024-09-09 DIAGNOSIS — E039 Hypothyroidism, unspecified: Secondary | ICD-10-CM | POA: Diagnosis not present

## 2024-09-09 DIAGNOSIS — E1159 Type 2 diabetes mellitus with other circulatory complications: Secondary | ICD-10-CM | POA: Diagnosis not present

## 2024-09-09 DIAGNOSIS — Z23 Encounter for immunization: Secondary | ICD-10-CM | POA: Diagnosis not present

## 2024-09-09 DIAGNOSIS — E1169 Type 2 diabetes mellitus with other specified complication: Secondary | ICD-10-CM

## 2024-09-09 DIAGNOSIS — J4540 Moderate persistent asthma, uncomplicated: Secondary | ICD-10-CM

## 2024-09-09 DIAGNOSIS — E785 Hyperlipidemia, unspecified: Secondary | ICD-10-CM

## 2024-09-09 DIAGNOSIS — Z7984 Long term (current) use of oral hypoglycemic drugs: Secondary | ICD-10-CM | POA: Diagnosis not present

## 2024-09-09 DIAGNOSIS — I152 Hypertension secondary to endocrine disorders: Secondary | ICD-10-CM | POA: Diagnosis not present

## 2024-09-09 LAB — CBC WITH DIFFERENTIAL/PLATELET
Basophils Absolute: 0 K/uL (ref 0.0–0.1)
Basophils Relative: 0.6 % (ref 0.0–3.0)
Eosinophils Absolute: 0.3 K/uL (ref 0.0–0.7)
Eosinophils Relative: 3.4 % (ref 0.0–5.0)
HCT: 40.4 % (ref 36.0–46.0)
Hemoglobin: 13 g/dL (ref 12.0–15.0)
Lymphocytes Relative: 27 % (ref 12.0–46.0)
Lymphs Abs: 2.2 K/uL (ref 0.7–4.0)
MCHC: 32.2 g/dL (ref 30.0–36.0)
MCV: 79.9 fl (ref 78.0–100.0)
Monocytes Absolute: 0.5 K/uL (ref 0.1–1.0)
Monocytes Relative: 6.7 % (ref 3.0–12.0)
Neutro Abs: 5 K/uL (ref 1.4–7.7)
Neutrophils Relative %: 62.3 % (ref 43.0–77.0)
Platelets: 309 K/uL (ref 150.0–400.0)
RBC: 5.06 Mil/uL (ref 3.87–5.11)
RDW: 15.2 % (ref 11.5–15.5)
WBC: 8 K/uL (ref 4.0–10.5)

## 2024-09-09 LAB — LIPID PANEL
Cholesterol: 117 mg/dL (ref 0–200)
HDL: 33.7 mg/dL — ABNORMAL LOW (ref >39.00)
LDL Cholesterol: 55 mg/dL (ref 0–99)
NonHDL: 83.55
Total CHOL/HDL Ratio: 3
Triglycerides: 145 mg/dL (ref 0.0–149.0)
VLDL: 29 mg/dL (ref 0.0–40.0)

## 2024-09-09 LAB — COMPREHENSIVE METABOLIC PANEL WITH GFR
ALT: 13 U/L (ref 0–35)
AST: 14 U/L (ref 0–37)
Albumin: 3.9 g/dL (ref 3.5–5.2)
Alkaline Phosphatase: 76 U/L (ref 39–117)
BUN: 14 mg/dL (ref 6–23)
CO2: 27 meq/L (ref 19–32)
Calcium: 9.3 mg/dL (ref 8.4–10.5)
Chloride: 105 meq/L (ref 96–112)
Creatinine, Ser: 0.81 mg/dL (ref 0.40–1.20)
GFR: 69.26 mL/min (ref >60.00)
Glucose, Bld: 126 mg/dL — ABNORMAL HIGH (ref 70–99)
Potassium: 3.8 meq/L (ref 3.5–5.1)
Sodium: 141 meq/L (ref 135–145)
Total Bilirubin: 0.5 mg/dL (ref 0.2–1.2)
Total Protein: 7.1 g/dL (ref 6.0–8.3)

## 2024-09-09 LAB — HEMOGLOBIN A1C: Hgb A1c MFr Bld: 7.2 % — ABNORMAL HIGH (ref 4.6–6.5)

## 2024-09-09 LAB — TSH: TSH: 5.25 u[IU]/mL (ref 0.35–5.50)

## 2024-09-09 LAB — MICROALBUMIN / CREATININE URINE RATIO
Creatinine,U: 213.2 mg/dL
Microalb Creat Ratio: 21.2 mg/g (ref 0.0–30.0)
Microalb, Ur: 4.5 mg/dL — ABNORMAL HIGH (ref 0.0–1.9)

## 2024-09-09 LAB — VITAMIN B12: Vitamin B-12: 570 pg/mL (ref 211–911)

## 2024-09-09 MED ORDER — NEBULIZER MISC: 1.0000 | Freq: Once | 1 refills | Status: AC

## 2024-09-09 MED ORDER — BACLOFEN 10 MG PO TABS: 5.0000 mg | ORAL_TABLET | Freq: Two times a day (BID) | ORAL | 1 refills | Status: DC | PRN

## 2024-09-09 MED ORDER — ROSUVASTATIN CALCIUM 10 MG PO TABS: 10.0000 mg | ORAL_TABLET | Freq: Every day | ORAL | 1 refills | Status: AC

## 2024-09-09 MED ORDER — ALBUTEROL SULFATE (2.5 MG/3ML) 0.083% IN NEBU: 2.5000 mg | INHALATION_SOLUTION | Freq: Four times a day (QID) | RESPIRATORY_TRACT | 1 refills | Status: AC | PRN

## 2024-09-09 NOTE — Patient Instructions (Signed)

## 2024-09-09 NOTE — Progress Notes (Signed)
 Labs mostly ok.  Sugars acceptable but could be a little better-keep working on it Thyroid  could use an adjustment-verify not missed doses or ran out.  If not, then increase to 0.05mg (can send in rx or if has a lot of 0.025-can take 2.

## 2024-09-09 NOTE — Progress Notes (Signed)
 Subjective:     Patient ID: Teresa Rice, female    DOB: Nov 04, 1946, 78 y.o.   MRN: [id number]  Chief Complaint  Patient presents with   Diabetes    29mo f/u; doing alright    HPI Discussed the use of AI scribe software for clinical note transcription with the patient, who gave verbal consent to proceed.  History of Present Illness Teresa Rice is a 78 year old female who presents for a follow-up visit.  She is being followed for hypertension and is currently on diltiazem  240 mg, furosemide  20 mg twice a day, metoprolol  100 mg, and valsartan  80 mg. Her home blood pressure readings are generally around 130/70 mmHg. No chest pain, heart racing, or shortness of breath.  For diabetes management, she takes empagliflozin  10 mg daily, metformin  1000 mg twice a day, Ozempic  2 mg once a week, and Lantus  (glargine) 38 units daily. She experiences occasional alerts from her monitor indicating 'critical' levels, which she finds confusing. Her blood sugar was 132 mg/dL without eating, and sometimes it drops to 69 mg/dL, triggering an alert. She does not have a backup meter and is unsure if the monitor is functioning correctly.  Regarding asthma, she experiences an occasional cough, especially after returning home following pest control treatment. She uses Symbicort  twice daily and albuterol  as needed, which is not every day. She attributes some of her coughing to environmental factors, such as exposure to insecticide.  She is on rosuvastatin  10 mg for hyperlipidemia and reports no gastrointestinal issues such as vomiting, diarrhea, or constipation. She experiences heartburn occasionally, particularly after eating certain foods like [place] cuisine, which she attributes to the sauce used.  She takes omeprazole  20 mg twice a day for reflux, which is well-controlled unless she eats out. She rarely eats out, approximately every six months, and experiences heartburn when she does.  She is on thyroid   medication, taking 25 mcg daily, and inquires about the implications of missing a dose. She is also using baclofen  for neck spasms and requests a refill.    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   Mild intermittent asthma    OA (osteoarthritis)    fingers    Palpitations 09/04/2022   Trigger finger of both hands    wear slints and special gloves at night   Type 2 diabetes mellitus [facility])     Past Surgical History:  Procedure Laterality Date   BLADDER SURGERY  2012   in [place]   TURBT   CHOLECYSTECTOMY  11/26/1968   CYSTOSCOPY/RETROGRADE/URETEROSCOPY Left 05/24/2014   Procedure: CYSTOSCOPY/RETROGRADE/ URETEROSCOPY;  Surgeon: Garnette CHRISTELLA matilda, MD;  Location: [facility];  Service: Urology;  Laterality: Left;   TRANSURETHRAL RESECTION OF BLADDER TUMOR N/A 05/24/2014   Procedure: TRANSURETHRAL RESECTION OF BLADDER TUMOR (TURBT) ;  Surgeon: Garnette CHRISTELLA matilda, MD;  Location: [facility];  Service: Urology;  Laterality: N/A;   TRANSURETHRAL RESECTION OF BLADDER TUMOR N/A 08/04/2015   Procedure: TRANSURETHRAL RESECTION OF BLADDER TUMOR (TURBT);  Surgeon:  Garnette Shack, MD;  Location: [facility];  Service: Urology;  Laterality: N/A;   WRIST SURGERY Right    x3     Current Outpatient Medications:    Accu-Chek Softclix Lancets lancets, , Disp: , Rfl:    albuterol  (PROVENTIL ) (2.5 MG/3ML) 0.083% nebulizer solution, Take 3 mLs (2.5 mg total) by nebulization every 6 (six) hours as needed for wheezing or shortness of breath., Disp: 150 mL, Rfl: 1    albuterol  (VENTOLIN  HFA) 108 (90 Base) MCG/ACT inhaler, INHALE 2 PUFFS BY MOUTH EVERY 6 HOURS AS NEEDED FOR WHEEZING FOR SHORTNESS OF BREATH, Disp: 36 g, Rfl: 0   ALPRAZolam  (XANAX ) 1 MG tablet, TAKE 1/2 (ONE-HALF) TABLET BY MOUTH THREE TIMES DAILY AND 1 TABLET AT BEDTIME, Disp: 75 tablet, Rfl: 5   BD PEN NEEDLE MINI ULTRAFINE 31G X 5 MM MISC, USE 1 PEN NEEDLE ONCE DAILY, Disp: 100 each, Rfl: 0   blood glucose meter kit and supplies, Dispense based on patient and insurance preference. Use up to four times daily as directed. (FOR ICD-10 E10.9, E11.9)., Disp: 1 each, Rfl: 0   Calcium  Carbonate (CALCIUM  600 PO), Take 1 tablet by mouth daily in the afternoon., Disp: , Rfl:    cholecalciferol (VITAMIN D3) 25 MCG (1000 UNIT) tablet, Take 1,000 Units by mouth daily., Disp: , Rfl:    Continuous Glucose Sensor (FREESTYLE LIBRE 3 PLUS SENSOR) MISC, Inject 1 each into the skin every 14 (fourteen) days. Change sensor every 15 days., Disp: 6 each, Rfl: 3   diltiazem  (CARTIA  XT) 240 MG 24 hr capsule, Take 1 capsule by mouth once daily, Disp: 90 capsule, Rfl: 2   empagliflozin  (JARDIANCE ) 10 MG TABS tablet, Take 1 tablet (10 mg total) by mouth daily before breakfast., Disp: 90 tablet, Rfl: 1   fenofibrate  160 MG tablet, Take 1 tablet (160 mg total) by mouth daily., Disp: 90 tablet, Rfl: 1   fluticasone  (FLONASE ) 50 MCG/ACT nasal spray, Place into both nostrils daily., Disp: , Rfl:    furosemide  (LASIX ) 20 MG tablet, Take 1 tablet (20 mg total) by mouth 2 (two) times daily., Disp: 180 tablet, Rfl: 1   glucose blood test strip, Use as instructed, Disp: 100 each, Rfl: 12   guaiFENesin -codeine  100-10 MG/5ML syrup, Take 5 mLs by mouth every 6 (six) hours as needed for cough., Disp: 118 mL, Rfl: 0   ipratropium (ATROVENT ) 0.06 % nasal spray, Place 2 sprays into both nostrils every 6 (six) hours as needed for rhinitis., Disp: 15 mL, Rfl: 12   LANTUS  SOLOSTAR 100 UNIT/ML Solostar Pen, Inject 40 Units into the skin daily.,  Disp: 45 mL, Rfl: 3   levothyroxine  (SYNTHROID ) 25 MCG tablet, TAKE 1 TABLET BY MOUTH ONCE DAILY FOR THYROID , Disp: 90 tablet, Rfl: 0   magnesium oxide (MAG-OX) 400 MG tablet, Take 400 mg by mouth 2 (two) times daily., Disp: , Rfl:    metFORMIN  (GLUCOPHAGE ) 1000 MG tablet, TAKE 1 TABLET BY MOUTH TWICE DAILY WITH A MEAL, Disp: 180 tablet, Rfl: 1   metoprolol  succinate (TOPROL  XL) 100 MG 24 hr tablet, Take 1 tablet (100 mg total) by mouth daily. Take with or immediately following a meal., Disp: 90 tablet, Rfl: 2   Multiple Vitamin (MULTIVITAMIN WITH MINERALS) TABS tablet, Take 1 tablet by mouth daily., Disp: , Rfl:    Nebulizer MISC, 1 each by Does not apply route once for 1 dose., Disp: 1 each, Rfl: 1   nystatin cream (MYCOSTATIN), Apply topically., Disp: , Rfl:  Omega 3 1000 MG CAPS, Take 1,000 mg by mouth daily., Disp: , Rfl:    omeprazole  (PRILOSEC) 20 MG capsule, Take 1 capsule (20 mg total) by mouth 2 (two) times daily before a meal., Disp: 180 capsule, Rfl: 1   OZEMPIC , 2 MG/DOSE, 8 MG/3ML SOPN, INJECT 2 MG INTO THE SKIN ONCE A WEEK, Disp: 9 mL, Rfl: 1   Polyethylene Glycol 3350 (MIRALAX PO), Take by mouth every evening., Disp: , Rfl:    potassium chloride  (KLOR-CON  M) 10 MEQ tablet, Take 1 tablet (10 mEq total) by mouth 2 (two) times daily., Disp: 180 tablet, Rfl: 1   sertraline  (ZOLOFT ) 50 MG tablet, Take 1 tablet (50 mg total) by mouth daily., Disp: 90 tablet, Rfl: 2   SYMBICORT  160-4.5 MCG/ACT inhaler, Inhale 2 puffs into the lungs in the morning and at bedtime., Disp: 33 g, Rfl: 3   traMADol  (ULTRAM ) 50 MG tablet, Take 1 tablet by mouth every 6 (six) hours as needed., Disp: , Rfl:    triamcinolone cream (KENALOG) 0.1 %, Apply topically 3 (three) times daily., Disp: , Rfl:    valsartan  (DIOVAN ) 80 MG tablet, Take 1 tablet by mouth once daily, Disp: 90 tablet, Rfl: 1   VITAMIN E PO, Take by mouth., Disp: , Rfl:    baclofen  (LIORESAL ) 10 MG tablet, Take 0.5 tablets (5 mg total) by mouth 2  (two) times daily as needed for muscle spasms., Disp: 30 each, Rfl: 1   rosuvastatin  (CRESTOR ) 10 MG tablet, Take 1 tablet (10 mg total) by mouth daily., Disp: 90 tablet, Rfl: 1  Allergies  Allergen Reactions   Celecoxib Anaphylaxis and Shortness Of Breath   Glipizide Itching   Penicillins Other (See Comments)    Burn marks from inside out    Sulfa Antibiotics Itching   ROS neg/noncontributory except as noted HPI/below      Objective:     BP 134/62   Pulse 80   Temp (!) 97.5 F (36.4 C)   Ht 5' 3 (1.6 m)   Wt 167 lb (75.8 kg)   LMP  (LMP Unknown)   SpO2 95%   BMI 29.58 kg/m  Wt Readings from Last 3 Encounters:  09/09/24 167 lb (75.8 kg)  06/15/24 165 lb (74.8 kg)  06/01/24 162 lb 6 oz (73.7 kg)    Physical Exam   Gen: WDWN NAD HEENT: NCAT, conjunctiva not injected, sclera nonicteric NECK:  supple, no thyromegaly, no nodes, no carotid bruits CARDIAC: RRR, S1S2+, no murmur. DP 2+B LUNGS: CTAB. No wheezes ABDOMEN:  BS+, soft, NTND, No HSM, no masses EXT:  no edema MSK: no gross abnormalities.  NEURO: A&O x3.  CN II-XII intact.  tremor PSYCH: normal mood. Good eye contact  Diabetic Foot Exam - Simple   Simple Foot Form Diabetic Foot exam was performed with the following findings: Yes 09/09/2024 10:04 AM  Visual Inspection No deformities, no ulcerations, no other skin breakdown bilaterally: Yes Sensation Testing Intact to touch and monofilament testing bilaterally: Yes Pulse Check Posterior Tibialis and Dorsalis pulse intact bilaterally: Yes Comments         Assessment & Plan:  Type 2 diabetes mellitus with insulin therapy (HCC) -     Comprehensive metabolic panel with GFR -     Hemoglobin A1c -     Microalbumin / creatinine urine ratio -     AMB Referral [facility] Care Management  Acquired hypothyroidism -     TSH  Hyperlipidemia associated with type 2 diabetes mellitus (HCC) -  Comprehensive metabolic panel with GFR -     Lipid panel -     AMB  Referral [facility] Care Management -     Rosuvastatin  Calcium ; Take 1 tablet (10 mg total) by mouth daily.  Dispense: 90 tablet; Refill: 1  Hypertension associated with diabetes (HCC) -     CBC with Differential/Platelet -     Comprehensive metabolic panel with GFR -     AMB Referral [facility] Care Management  Moderate persistent asthma without complication -     AMB Referral [facility] Care Management -     Albuterol  Sulfate; Take 3 mLs (2.5 mg total) by nebulization every 6 (six) hours as needed for wheezing or shortness of breath.  Dispense: 150 mL; Refill: 1 -     Nebulizer; 1 each by Does not apply route once for 1 dose.  Dispense: 1 each; Refill: 1  High risk medication use -     Comprehensive metabolic panel with GFR -     Vitamin B12  Flu vaccine need -     Flu vaccine HIGH DOSE PF(Fluzone Trivalent)  Long term current use of oral hypoglycemic drug  Long-term current use of injectable noninsulin antidiabetic medication  Encounter for long-term (current) use of insulin (HCC)  Other orders -     Baclofen ; Take 0.5 tablets (5 mg total) by mouth 2 (two) times daily as needed for muscle spasms.  Dispense: 30 each; Refill: 1  Assessment and Plan Assessment & Plan Type 2 diabetes mellitus   Blood sugars are generally well-controlled, though occasional alerts from the glucose monitor indicate potential lows. There is some confusion regarding the monitor's alerts and functionality. Continue empagliflozin  10 mg daily, metformin  1000 mg twice daily, Ozempic  2 mg weekly, and Lantus  38 units daily. Refer to a pharmacist for glucose monitor education and data review. Consider obtaining a backup glucose meter.  Hypertension   Blood pressure is well-controlled with the current medication regimen. No reports of chest pain, palpitations, or dyspnea. Lisinopril  has been discontinued due to interaction with valsartan . Continue diltiazem  240 mg daily, metoprolol  100 mg daily, valsartan  80 mg daily, and  furosemide  20 mg twice daily.  Moderate persistent asthma   Intermittent cough, possibly exacerbated by recent exposure to insecticide. Cough is not as severe as previous episodes. Continue Symbicort  twice daily and use albuterol  as needed for cough. Provide prescription for a nebulizer machine and send prescription for albuterol  nebulizer solution to pharmacy.  Hyperlipidemia   Cholesterol management is ongoing with rosuvastatin . No adverse effects reported. Continue rosuvastatin  10 mg daily.  Gastroesophageal reflux disease (GERD)   Reflux is well-managed with omeprazole . Occasional heartburn occurs with certain foods, particularly at restaurants. Continue omeprazole  20 mg twice daily. Consider taking Pepcid before dining out to prevent heartburn.  Hypothyroidism   Thyroid  function is stable with the current levothyroxine  dose. Occasional missed doses are not concerning if infrequent. Continue levothyroxine  25 mcg daily.  Muscle spasms of neck   Intermittent neck spasms reported. Baclofen  is used for management. Refill baclofen  prescription.    Return in about 3 months (around 12/10/2024) for chronic follow-up.  Jenkins CHRISTELLA Carrel, MD

## 2024-09-10 NOTE — Telephone Encounter (Signed)
 Spoke to patient about recent lab results and notes per PCP. Informed on levothyroxine  25mg  sent on 09/08/24. Informed to take 2 of the 25 per Dr Wendolyn and to call when refill is needed to switch to 50. Pt acknowledged understanding.

## 2024-09-11 ENCOUNTER — Telehealth: Payer: Self-pay

## 2024-09-11 NOTE — Progress Notes (Signed)
 Care Guide Pharmacy Note  09/11/2024 Name: [name] MRN: [id number] DOB: 07/30/1946  Referred By: Wendolyn Jenkins Jansky, MD Reason for referral: Complex Care Management (Outreach to schedule referral with PharmD)   Teresa Rice is a 78 y.o. year old female who is a primary care patient of Wendolyn Jenkins Jansky, MD.  Teresa Rice was referred to the pharmacist for assistance related to: HLD and DMII  Successful contact was made with the patient to discuss pharmacy services including being ready for the pharmacist to call at least 5 minutes before the scheduled appointment time and to have medication bottles and any blood pressure readings ready for review. The patient agreed to meet with the pharmacist via telephone visit on 09/24/2024.  [facility]  [facility], [facility] Guide  Direct Dial: [phone]  Fax [phone]

## 2024-09-11 NOTE — Progress Notes (Signed)
 Opened in error

## 2024-09-11 NOTE — Progress Notes (Signed)
 Care Guide Pharmacy Note  09/11/2024 Name: Teresa Rice MRN: [id number] DOB: 1946/03/30  Referred By: Wendolyn Jenkins Jansky, MD Reason for referral: Complex Care Management (Outreach to schedule referral with PharmD)   Teresa Rice is a 78 y.o. year old female who is a primary care patient of Wendolyn Jenkins Jansky, MD.  Teresa Rice was referred to the pharmacist for assistance related to: HLD and DMII  An unsuccessful telephone outreach was attempted today to contact the patient who was referred to the pharmacy team for assistance with medication management. Additional attempts will be made to contact the patient.  [facility]  [facility], [facility] Guide  Direct Dial: [phone]  Fax [phone]

## 2024-09-18 DIAGNOSIS — J4540 Moderate persistent asthma, uncomplicated: Secondary | ICD-10-CM | POA: Diagnosis not present

## 2024-09-24 ENCOUNTER — Telehealth: Payer: Self-pay | Admitting: Pharmacist

## 2024-09-24 ENCOUNTER — Other Ambulatory Visit: Payer: Self-pay | Admitting: Pharmacist

## 2024-09-24 NOTE — Progress Notes (Signed)
 Attempted to outreach patient again for scheduled 9am appt with Clinical Pharmacist to discuss Continuous Glucose Monitor, diabetes and medications.  Unable to reach patient twice (tried at 9am and again at 9:37am)  LM on VM with my direct number [phone]

## 2024-09-24 NOTE — Progress Notes (Signed)
 Attempt was made to contact patient by phone today for follow up by Clinical Pharmacist regarding diabetes / meds / Continuous Glucose Monitor. .  Unable to reach patient. LM on VM with my contact number [phone] (also left number for [place] office for today only [phone])

## 2024-09-25 ENCOUNTER — Other Ambulatory Visit: Payer: Self-pay | Admitting: Family Medicine

## 2024-09-30 ENCOUNTER — Telehealth: Payer: Self-pay | Admitting: *Deleted

## 2024-09-30 NOTE — Progress Notes (Signed)
 Complex Care Management Care Guide Note  09/30/2024 Name: Teresa Rice MRN: [id number] DOB: 28-May-1946  Teresa Rice is a 78 y.o. year old female who is a primary care patient of Wendolyn Jenkins Jansky, MD and is actively engaged with the care management team. I reached out to Teresa Rice by phone today to assist with re-scheduling  with the Pharmacist.  Follow up plan: Telephone appointment with complex care management team member scheduled for:  10/06/2024  Thedford Franks, CMA [place]  [facility], [facility] Guide Direct Dial: [phone]  Fax: [phone] Website: [email or web address]

## 2024-09-30 NOTE — Progress Notes (Signed)
 Complex Care Management Care Guide Note  09/30/2024 Name: Teresa Rice MRN: [id number] DOB: [birth year]  [name] is a 78 y.o. year old female who is a primary care patient of Wendolyn Jenkins Jansky, MD and is actively engaged with the care management team. I reached out to [name] by phone today to assist with re-scheduling  with the Pharmacist.  Follow up plan: Unsuccessful telephone outreach attempt made. A HIPAA compliant phone message was left for the patient providing contact information and requesting a return call.  Thedford Franks, CMA [place]  [facility], [facility] Guide Direct Dial: [phone]  Fax: [phone] Website: [email or web address]

## 2024-10-06 ENCOUNTER — Telehealth: Payer: Self-pay | Admitting: Pharmacist

## 2024-10-06 ENCOUNTER — Other Ambulatory Visit: Payer: Self-pay | Admitting: Pharmacist

## 2024-10-06 NOTE — Progress Notes (Signed)
 10/06/2024 Name: Teresa Rice MRN: [id number] DOB: [birth year]  Chief Complaint  Patient presents with   Diabetes   Medication Adherence    Teresa Rice is a 78 y.o. year old female who presented for a telephone visit.   They were referred to the pharmacist by their PCP for assistance in managing diabetes and complex medication management.    Subjective:  Care Team: Primary Care Provider: Wendolyn Jenkins Jansky, MD ; Next Scheduled Visit: 12/11/2024  Medication Access/Adherence  Current Pharmacy:  [facility] [address] [place] - [address] [place] [address] Phone: [phone] Fax: [phone]  [facility] Delivery) - [place], [place] - [address] [facility] [address] [place] [address] [place]  [address] Phone: [phone] Fax: [phone]  [facility] #[id number] [place], [place] - [address] AT [facility] OF [facility] & [address] [place] [address] Phone: [phone] Fax: [phone]   Patient reports affordability concerns with their medications: No  Patient reports access/transportation concerns to their pharmacy: No  Patient reports adherence concerns with their medications:  Yes  - states she is having difficulty using Libre sensors   Diabetes:  Current medications: Lantus  40 units once a day, Ozempic  2mg  weekly, Jardiance  10mg  daily with breakfast and metformin  1000mg  twice a day  Current glucose readings: Using [employer] 3 plus sensors and Libre 3 app  Date of Download: 10/06/2024 % Time CGM is active: 91% Average Glucose: 145 mg/dL Glucose Management Indicator: 6.8%  Glucose Variability: 23.8% (goal <36%) Time in Goal:  - Time in range 70-180: 83% - Time above range: 16% - Time below range: 1% Observed patterns: Patient was concerned about lows that it appears that she only had 1 days of lows on November 3rd and then a  quick low on October 29th that occurred when sensor fell off when she bumped a door jam.  She is checking blood glucose when sensor shows low with a fingerstick confirmation.    Patient denies hypoglycemic s/sx including no dizziness, shakiness, sweating. Patient denies hyperglycemic symptoms including no polyuria, polydipsia, polyphagia, nocturia, neuropathy, blurred vision.no  Current meal patterns:  - Breakfast: pancake + bacon with sugar-free syrup;  - Lunch varies - Supper Varies - Snacks sugar free fudgecicles and sugar free cookies - Drinks - water    Macrovascular and Microvascular Risk Reduction:  Statin? yes (rosuvastatin  ); ACEi/ARB? yes (valsartan  80mg  ) Last urinary albumin/creatinine ratio:  Lab Results  Component Value Date   MICRALBCREAT 21.2 09/09/2024   MICRALBCREAT 12.8 03/02/2024   Last eye exam:  Lab Results  Component Value Date   HMDIABEYEEXA No Retinopathy 10/18/2023   Last foot exam: 09/09/2024 Tobacco Use:  Tobacco Use: Medium Risk (09/09/2024)   Patient History    Smoking Tobacco Use: Former    Smokeless Tobacco Use: Never    Passive Exposure: Not on file   Reviewed med list and refill history.  Noted that following meds that might be due to be refilled:  Furosemide  and potassium - per patient she takes furosemide  if needed for swelling. She will take potassium with furosemide . Symbicort  - patient states she has been taking regularly but she is not sure if it is effective because she still gets shortness of breath when she is active.   Objective:  BP Readings from Last 3 Encounters:  09/09/24 134/62  06/15/24 130/64  06/01/24 122/78  Lab Results  Component Value Date   HGBA1C 7.2 (H) 09/09/2024    Lab Results  Component Value Date   CREATININE 0.81 09/09/2024   BUN 14 09/09/2024   NA 141 09/09/2024   K 3.8 09/09/2024   CL 105 09/09/2024   CO2 27 09/09/2024    Lab Results  Component Value Date   CHOL 117 09/09/2024    HDL 33.70 (L) 09/09/2024   LDLCALC 55 09/09/2024   LDLDIRECT 74.0 05/23/2023   TRIG 145.0 09/09/2024   CHOLHDL 3 09/09/2024    Medications Reviewed Today     Reviewed by Carla Milling, RPH-CPP (Pharmacist) on 10/06/24 at 1614  Med List Status: <None>   Medication Order Taking? Sig Documenting Provider Last Dose Status Informant  Accu-Chek Softclix Lancets lancets [id number]   [provider]  Active   albuterol  (PROVENTIL ) (2.5 MG/3ML) 0.083% nebulizer solution [id number]  Take 3 mLs (2.5 mg total) by nebulization every 6 (six) hours as needed for wheezing or shortness of breath. Wendolyn Jenkins Jansky, MD  Active   albuterol  (VENTOLIN  HFA) 108 [phone] Base) MCG/ACT inhaler [id number] Yes INHALE 2 PUFFS BY MOUTH EVERY 6 HOURS AS NEEDED FOR WHEEZING FOR SHORTNESS OF SHERIDA Wendolyn Jenkins Jansky, MD  Active   ALPRAZolam  (XANAX ) 1 MG tablet [id number] Yes TAKE 1/2 (ONE-HALF) TABLET BY MOUTH THREE TIMES DAILY AND 1 TABLET AT BEDTIME Cottle, Lorene KANDICE Raddle., MD  Active   baclofen  (LIORESAL ) 10 MG tablet [id number]  Take 0.5 tablets (5 mg total) by mouth 2 (two) times daily as needed for muscle spasms. Wendolyn Jenkins Jansky, MD  Active   blood glucose meter kit and supplies [id number]  Dispense based on patient and insurance preference. Use up to four times daily as directed. (FOR ICD-10 E10.9, E11.9). Waddell Rake, MD  Active   Calcium  Carbonate (CALCIUM  600 PO) [id number]  Take 1 tablet by mouth daily in the afternoon. [provider]  Active   cholecalciferol (VITAMIN D3) 25 MCG (1000 UNIT) tablet [id number]  Take 1,000 Units by mouth daily. [provider]  Active   Continuous Glucose Sensor (FREESTYLE LIBRE 3 PLUS SENSOR) MISC [id number] Yes Inject 1 each into the skin every 14 (fourteen) days. Change sensor every 15 days. Wendolyn Jenkins Jansky, MD  Active   diltiazem  (CARTIA  XT) 240 MG 24 hr capsule [id number] Yes Take 1 capsule by mouth once daily Raford Riggs, MD  Active   [facility] PEN NEEDLE  ULTRAFINE 31G X 5 MM MISC [id number]  USE 1 PEN NEEDLE ONCE DAILY Wendolyn Jenkins Jansky, MD  Active   empagliflozin  (JARDIANCE ) 10 MG TABS tablet [id number] Yes Take 1 tablet (10 mg total) by mouth daily before breakfast. Wendolyn Jenkins Jansky, MD  Active   fenofibrate  160 MG tablet [id number] Yes Take 1 tablet (160 mg total) by mouth daily. Wendolyn Jenkins Jansky, MD  Active   fluticasone  (FLONASE ) 50 MCG/ACT nasal spray [id number] Yes Place into both nostrils daily. [provider]  Active   furosemide  (LASIX ) 20 MG tablet [id number] Yes Take 1 tablet (20 mg total) by mouth 2 (two) times daily.  Patient taking differently: Take 20 mg by mouth as needed.   Wendolyn Jenkins Jansky, MD  Active   glucose blood test strip [id number]  Use as instructed Waddell Rake, MD  Active   guaiFENesin -codeine  100-10 MG/5ML syrup [id number]  Take 5 mLs by mouth every 6 (six) hours as needed for cough. Wendolyn Jenkins Jansky, MD  Active   ipratropium (ATROVENT ) 0.06 % nasal spray  [id number]  Place 2 sprays into both nostrils every 6 (six) hours as needed for rhinitis. Wendolyn Jenkins Jansky, MD  Active   LANTUS  SOLOSTAR 100 UNIT/ML Solostar Pen [id number] Yes Inject 40 Units into the skin daily. Wendolyn Jenkins Jansky, MD  Active   levothyroxine  (SYNTHROID ) 25 MCG tablet [id number]  TAKE 1 TABLET BY MOUTH ONCE DAILY FOR THYROID  Wendolyn Jenkins Jansky, MD  Active   magnesium oxide (MAG-OX) 400 MG tablet [id number]  Take 400 mg by mouth 2 (two) times daily. [provider]  Active Self  metFORMIN  (GLUCOPHAGE ) 1000 MG tablet [id number]  TAKE 1 TABLET BY MOUTH TWICE DAILY WITH A MEAL Wendolyn Jenkins Jansky, MD  Active   metoprolol  succinate (TOPROL  XL) 100 MG 24 hr tablet [id number]  Take 1 tablet (100 mg total) by mouth daily. Take with or immediately following a meal. Walker, Caitlin S, NP  Active   Multiple Vitamin (MULTIVITAMIN WITH MINERALS) TABS tablet [id number]  Take 1 tablet by mouth daily. [provider]  Active Self  nystatin cream (MYCOSTATIN)  [id number]  Apply topically. [provider]  Active   Omega 3 1000 MG CAPS [id number]  Take 1,000 mg by mouth daily. [provider]  Active Self  omeprazole  (PRILOSEC) 20 MG capsule [id number]  Take 1 capsule (20 mg total) by mouth 2 (two) times daily before a meal. Wendolyn Jenkins Jansky, MD  Active   OZEMPIC , 2 MG/DOSE, 8 MG/3ML NELMA [id number] Yes INJECT 2 MG INTO THE SKIN ONCE A WEEK Wendolyn Jenkins Jansky, MD  Active   Polyethylene Glycol 3350 (MIRALAX PO) [id number] Yes Take by mouth every evening. [provider]  Active   potassium chloride  (KLOR-CON  M) 10 MEQ tablet [id number] Yes Take 1 tablet (10 mEq total) by mouth 2 (two) times daily.  Patient taking differently: Take 10 mEq by mouth as needed (with furosemide ).   Wendolyn Jenkins Jansky, MD  Active   rosuvastatin  (CRESTOR ) 10 MG tablet [id number] Yes Take 1 tablet (10 mg total) by mouth daily. Wendolyn Jenkins Jansky, MD  Active   sertraline  (ZOLOFT ) 50 MG tablet [id number] Yes Take 1 tablet (50 mg total) by mouth daily. Geoffry Lorene KANDICE Mickey., MD  Active   SYMBICORT  160-4.5 MCG/ACT inhaler [id number] Yes Inhale 2 puffs into the lungs in the morning and at bedtime. Wendolyn Jenkins Jansky, MD  Active   traMADol  (ULTRAM ) 50 MG tablet [id number]  Take 1 tablet by mouth every 6 (six) hours as needed. [provider]  Active   triamcinolone cream (KENALOG) 0.1 % [id number]  Apply topically 3 (three) times daily. [provider]  Active   valsartan  (DIOVAN ) 80 MG tablet [id number] Yes Take 1 tablet by mouth once daily Walker, Caitlin S, NP  Active   VITAMIN E PO [id number]  Take by mouth. [provider]  Active               Assessment/Plan:   Diabetes: - Currently uncontrolled; goal A1c <7.5%. Cardiorenal risk reduction is optimized.. Blood pressure is at goal <130/80. LDL is at goal.  - Reviewed goal A1c, goal fasting, and goal 2 hour post prandial glucose. Recommended to check glucose with Continuous Glucose Monitor  continously, Reviewed dietary modifications including limiting intake of high CHO foods., and Reviewed signs and symptoms of hypoglycemia.and how to treat low blood glucose < 70. - Recommend to continue Lantus  40 units once a day, Jardiance  10mg  daily, metformin  1000mg  twice a day and Ozempic  2mg  weekly .   Asthma: -  Currently uncontrolled.  - Reviewed appropriate inhaler technique. - Recommend to use albuterol  inhaler as needed for shortness of breath. She can also try 1 or 2 puff prior to exercise / activity.  - Discussed importance of using Symbicort  2 puffs twice a day EVERY day.    Follow Up Plan: 1 week - in office to provide Continuous Glucose Monitor education  Madelin Ray, PharmD Clinical Pharmacist [facility] Primary [facility]  Population Health [phone]

## 2024-10-06 NOTE — Progress Notes (Signed)
 Tried to contact patient for initial appointment with Clinical Pharmacist - second outreach attempt.  Unable to reach patient - call went straight to VM. LM with CB# [phone].    Addendum:  Patient called back and we were able to connect for appointment  Madelin Ray, PharmD Clinical Pharmacist [facility] Primary Care  [facility] [phone]

## 2024-10-15 ENCOUNTER — Other Ambulatory Visit: Payer: Self-pay | Admitting: Pharmacist

## 2024-10-15 ENCOUNTER — Other Ambulatory Visit (HOSPITAL_BASED_OUTPATIENT_CLINIC_OR_DEPARTMENT_OTHER): Payer: Self-pay | Admitting: Family

## 2024-10-15 DIAGNOSIS — I10 Essential (primary) hypertension: Secondary | ICD-10-CM

## 2024-10-15 NOTE — Progress Notes (Signed)
 10/15/2024 Name: Teresa Rice MRN: [id number] DOB: September 05, 1946  No chief complaint on file.   Teresa Rice is a 78 y.o. year old female who presented for a telephone visit.   They were referred to the pharmacist by their PCP for assistance in managing diabetes and complex medication management.    Subjective:  Care Team: Primary Care Provider: Wendolyn Jenkins Jansky, MD ; Next Scheduled Visit: 12/11/2024  Medication Access/Adherence  Current Pharmacy:  [facility] [address], [place] - [address] [place] [address] Phone: [phone] Fax: [phone]  [facility] Delivery) - [place], [place] - [address] [facility] [address] [place] [address] [place] [address] Phone: [phone] Fax: [phone]  [facility]ORE #[id number] [place], [place] - [address] AT [facility] OF [facility] & [address] [place] [address] Phone: [phone] Fax: [phone]   Patient reports affordability concerns with their medications: No  Patient reports access/transportation concerns to their pharmacy: No  Patient reports adherence concerns with their medications:  Yes  - states she is having difficulty using Libre sensors   Diabetes:  Current medications: Lantus  40 units once a day, Ozempic  2mg  weekly, Jardiance  10mg  daily with breakfast and metformin  1000mg  twice a day  Medications tried in the past: Mounjaro  - appetite increase  Current glucose readings: Using [employer] 3 plus sensors and Libre 3 app  Date of Download: 10/15/2024 % Time CGM is active: 89% Average Glucose: 143 mg/dL Glucose Management Indicator: 6.7%  Glucose Variability: 25% (goal <36%) Time in Goal:  - Time in range 70-180: 83% - Time above range: 17% - Time below range: 0% Observed patterns: a few blood glucose reading over 250 and one low that lasted about 20 to 30 minutes. Patient did  confirm with fingerstick and treated by eating 2 or 3 lifesavers.       Patient denies hypoglycemic s/sx including no dizziness, shakiness, sweating. Patient denies hyperglycemic symptoms including no polyuria, polydipsia, polyphagia, nocturia, neuropathy, blurred vision.no  Current meal patterns:  - Breakfast: pancake + bacon with sugar-free syrup; cereal + [employer] milk or Protein drink + iced coffee (no sugar added)  - Lunch - sometimes skips; tuna fish sandwich - Supper Varies - hot dog ( 1 or 2); broccoli, beans,  - Snacks sugar free fudgecicles and sugar free shortbread cookie - Drinks - water , Atkins protein drink + iced coffee  Exercise:    Macrovascular and Microvascular Risk Reduction:  Statin? yes (rosuvastatin  ); ACEi/ARB? yes (valsartan  80mg  ) Last urinary albumin/creatinine ratio:  Lab Results  Component Value Date   MICRALBCREAT 21.2 09/09/2024   MICRALBCREAT 12.8 03/02/2024   Last eye exam:  Lab Results  Component Value Date   HMDIABEYEEXA No Retinopathy 10/18/2023   Last foot exam: 09/09/2024 Tobacco Use:  Tobacco Use: Medium Risk (09/09/2024)   Patient History    Smoking Tobacco Use: Former    Smokeless Tobacco Use: Never    Passive Exposure: Not on file     Objective:  BP Readings from Last 3 Encounters:  09/09/24 134/62  06/15/24 130/64  06/01/24 122/78     Lab Results  Component Value Date   HGBA1C 7.2 (H) 09/09/2024    Lab Results  Component Value Date   CREATININE 0.81 09/09/2024   BUN 14 09/09/2024   NA 141 09/09/2024   K 3.8 09/09/2024   CL 105 09/09/2024  CO2 27 09/09/2024    Lab Results  Component Value Date   CHOL 117 09/09/2024   HDL 33.70 (L) 09/09/2024   LDLCALC 55 09/09/2024   LDLDIRECT 74.0 05/23/2023   TRIG 145.0 09/09/2024   CHOLHDL 3 09/09/2024    Medications Reviewed Today   Medications were not reviewed in this encounter       Assessment/Plan:   Diabetes: - Currently uncontrolled; goal A1c  <7.5%. Cardiorenal risk reduction is optimized.. Blood pressure is at goal <130/80. LDL is at goal.  - Reviewed goal A1c, goal fasting, and goal 2 hour post prandial glucose. Recommended to check glucose with Continuous Glucose Monitor continously, Reviewed dietary modifications including limiting intake of high CHO foods., and Reviewed signs and symptoms of hypoglycemia.and how to treat low blood glucose < 70. - Recommend to continue Lantus  40 units once a day, Jardiance  10mg  daily, metformin  1000mg  twice a day and Ozempic  2mg  weekly . - Discussed diet in depth today. Provided handout of ADA plate method. Encouraged patient to have some non starchy vegetables every day and occasionally can have fruits as long as she sticks to serving size of 1/2 cup / baseball sized fruit.  - Eye exam- requested recent office notes to be faxed to PCP office from [facility] [phone])   Follow Up Plan: January 2026 - follow up with PCP; February 2026 - follow up with Clinical pharmacist.   Madelin Ray, PharmD Clinical Pharmacist [facility] Primary [facility]  [facility] [phone]

## 2024-10-15 NOTE — Patient Instructions (Addendum)
 Try to each variety of vegetable and fruits. Choose multi-colored foods / Vegetables and fruits give you the minerals and vitamins you need as well as fiber.   Increase non-starchy vegetables - carrots, green bean, squash, zucchini, tomatoes, onions, peppers, spinach and other green leafy vegetables, cabbage, lettuce, cucumbers, asparagus, okra (not fried), eggplant   Increase fresh fruit but limit serving sizes 1/2 cup or about the size of tennis or baseball 1/2 cup of melons or berries 1/2 of a banana Small / Cutie orange  Limit sugar and processed foods (cakes, cookies, ice cream, crackers and chips). Even no sugar foods can have carbohydrates.   Limit red meat to no more than 1-2 times per week (serving size about the size of your palm) Choose whole grains / lean proteins - whole wheat bread, quinoa, whole grain rice (1/2 cup), fish, chicken, turkey  Avoid sugar and calorie containing beverages - soda, sweet tea and juice.  Choose water  or unsweetened tea instead.

## 2024-10-20 ENCOUNTER — Other Ambulatory Visit (HOSPITAL_BASED_OUTPATIENT_CLINIC_OR_DEPARTMENT_OTHER): Payer: Self-pay | Admitting: Family

## 2024-10-20 DIAGNOSIS — I4711 Inappropriate sinus tachycardia, so stated: Secondary | ICD-10-CM

## 2024-10-20 DIAGNOSIS — R002 Palpitations: Secondary | ICD-10-CM

## 2024-10-21 ENCOUNTER — Other Ambulatory Visit: Payer: Self-pay | Admitting: Family Medicine

## 2024-10-21 DIAGNOSIS — J4540 Moderate persistent asthma, uncomplicated: Secondary | ICD-10-CM

## 2024-10-22 ENCOUNTER — Encounter (HOSPITAL_BASED_OUTPATIENT_CLINIC_OR_DEPARTMENT_OTHER): Payer: Self-pay | Admitting: Cardiovascular Disease

## 2024-10-22 DIAGNOSIS — R002 Palpitations: Secondary | ICD-10-CM

## 2024-10-22 DIAGNOSIS — I4711 Inappropriate sinus tachycardia, so stated: Secondary | ICD-10-CM

## 2024-10-26 MED ORDER — METOPROLOL SUCCINATE ER 100 MG PO TB24: 100.0000 mg | ORAL_TABLET | Freq: Every day | ORAL | 3 refills | Status: DC

## 2024-11-01 ENCOUNTER — Other Ambulatory Visit: Payer: Self-pay | Admitting: Family Medicine

## 2024-11-18 ENCOUNTER — Other Ambulatory Visit: Payer: Self-pay | Admitting: Family Medicine

## 2024-11-20 ENCOUNTER — Other Ambulatory Visit: Payer: Self-pay | Admitting: Family Medicine

## 2024-12-08 ENCOUNTER — Other Ambulatory Visit: Payer: Self-pay | Admitting: Family

## 2024-12-08 ENCOUNTER — Other Ambulatory Visit: Payer: Self-pay

## 2024-12-08 DIAGNOSIS — J4540 Moderate persistent asthma, uncomplicated: Secondary | ICD-10-CM

## 2024-12-08 MED ORDER — ALBUTEROL SULFATE HFA 108 (90 BASE) MCG/ACT IN AERS: INHALATION_SPRAY | RESPIRATORY_TRACT | 0 refills | Status: AC

## 2024-12-11 ENCOUNTER — Encounter: Payer: Self-pay | Admitting: Family Medicine

## 2024-12-11 ENCOUNTER — Ambulatory Visit: Admitting: Family Medicine

## 2024-12-11 VITALS — BP 122/68 | HR 74 | Temp 97.1°F | Ht 63.0 in | Wt 165.4 lb

## 2024-12-11 DIAGNOSIS — I152 Hypertension secondary to endocrine disorders: Secondary | ICD-10-CM | POA: Diagnosis not present

## 2024-12-11 DIAGNOSIS — R053 Chronic cough: Secondary | ICD-10-CM | POA: Diagnosis not present

## 2024-12-11 DIAGNOSIS — Z794 Long term (current) use of insulin: Secondary | ICD-10-CM

## 2024-12-11 DIAGNOSIS — J4540 Moderate persistent asthma, uncomplicated: Secondary | ICD-10-CM

## 2024-12-11 DIAGNOSIS — E119 Type 2 diabetes mellitus without complications: Secondary | ICD-10-CM

## 2024-12-11 DIAGNOSIS — E1169 Type 2 diabetes mellitus with other specified complication: Secondary | ICD-10-CM | POA: Diagnosis not present

## 2024-12-11 DIAGNOSIS — E785 Hyperlipidemia, unspecified: Secondary | ICD-10-CM | POA: Diagnosis not present

## 2024-12-11 DIAGNOSIS — Z7985 Long-term (current) use of injectable non-insulin antidiabetic drugs: Secondary | ICD-10-CM

## 2024-12-11 DIAGNOSIS — K219 Gastro-esophageal reflux disease without esophagitis: Secondary | ICD-10-CM

## 2024-12-11 DIAGNOSIS — E039 Hypothyroidism, unspecified: Secondary | ICD-10-CM | POA: Diagnosis not present

## 2024-12-11 DIAGNOSIS — Z7984 Long term (current) use of oral hypoglycemic drugs: Secondary | ICD-10-CM | POA: Diagnosis not present

## 2024-12-11 DIAGNOSIS — E1159 Type 2 diabetes mellitus with other circulatory complications: Secondary | ICD-10-CM

## 2024-12-11 LAB — COMPREHENSIVE METABOLIC PANEL WITH GFR
ALT: 20 U/L (ref 3–35)
AST: 17 U/L (ref 5–37)
Albumin: 4.3 g/dL (ref 3.5–5.2)
Alkaline Phosphatase: 83 U/L (ref 39–117)
BUN: 20 mg/dL (ref 6–23)
CO2: 29 meq/L (ref 19–32)
Calcium: 10.1 mg/dL (ref 8.4–10.5)
Chloride: 105 meq/L (ref 96–112)
Creatinine, Ser: 0.78 mg/dL (ref 0.40–1.20)
GFR: 72.34 mL/min
Glucose, Bld: 155 mg/dL — ABNORMAL HIGH (ref 70–99)
Potassium: 4.3 meq/L (ref 3.5–5.1)
Sodium: 141 meq/L (ref 135–145)
Total Bilirubin: 0.6 mg/dL (ref 0.2–1.2)
Total Protein: 7.2 g/dL (ref 6.0–8.3)

## 2024-12-11 LAB — HEMOGLOBIN A1C: Hgb A1c MFr Bld: 7 % — ABNORMAL HIGH (ref 4.6–6.5)

## 2024-12-11 LAB — TSH: TSH: 1.61 u[IU]/mL (ref 0.35–5.50)

## 2024-12-11 LAB — T4, FREE: Free T4: 0.86 ng/dL (ref 0.60–1.60)

## 2024-12-11 LAB — T3, FREE: T3, Free: 3.1 pg/mL (ref 2.3–4.2)

## 2024-12-11 MED ORDER — SYMBICORT 160-4.5 MCG/ACT IN AERO: 2.0000 | INHALATION_SPRAY | Freq: Two times a day (BID) | RESPIRATORY_TRACT | 3 refills | Status: AC

## 2024-12-11 MED ORDER — TIRZEPATIDE 5 MG/0.5ML ~~LOC~~ SOAJ: 5.0000 mg | SUBCUTANEOUS | 0 refills | Status: AC

## 2024-12-11 MED ORDER — OMEPRAZOLE 20 MG PO CPDR: 20.0000 mg | DELAYED_RELEASE_CAPSULE | Freq: Two times a day (BID) | ORAL | 1 refills | Status: AC

## 2024-12-11 MED ORDER — PROMETHAZINE-DM 6.25-15 MG/5ML PO SYRP: 5.0000 mL | ORAL_SOLUTION | Freq: Every evening | ORAL | 1 refills | Status: AC | PRN

## 2024-12-11 MED ORDER — EMPAGLIFLOZIN 10 MG PO TABS: 10.0000 mg | ORAL_TABLET | Freq: Every day | ORAL | 1 refills | Status: AC

## 2024-12-11 MED ORDER — FENOFIBRATE 160 MG PO TABS: 160.0000 mg | ORAL_TABLET | Freq: Every day | ORAL | 1 refills | Status: AC

## 2024-12-11 NOTE — Patient Instructions (Signed)

## 2024-12-11 NOTE — Progress Notes (Addendum)
 "  Subjective:     Patient ID: Teresa Rice, female    DOB: [birth year], 79 y.o.   MRN: [id number]  Chief Complaint  Patient presents with   Diabetes    Pt is here for chronic issues    Discussed the use of AI scribe software for clinical note transcription with the patient, who gave verbal consent to proceed.  History of Present Illness Teresa Rice is a 79 year old female with diabetes and thyroid  issues who presents for follow-up.  Hypothyroidism-She takes two 25 mg pills of her thyroid  medication daily, totaling 50 mg. She attributes her thinning hair to her thyroid  condition.   HTN-Her blood pressure readings have been around 108/72 or 78, without dizziness. She uses a Fitbit to track her blood pressure and reports that the readings are similar to those from her blood pressure cuff. Her medications for blood pressure include diltiazem  240 mg, metoprolol  100 mg, and valsartan  80 mg, with furosemide  as needed.  About a month ago, she experienced shortness of breath and fluctuating blood pressure and blood sugar levels, which resolved by the end of the day. Her heart rate was around 100 bpm during this episode, and she managed the symptoms by resting and drinking water .  For diabetes management, she is on Ozempic  2 mg weekly, empagliflozin  10 mg, metformin  1000 mg twice daily, and Lantus  38 units daily. She has been working with a nutritionist and has made dietary changes, including reducing her food intake and consuming more greens. She reports a weight loss from 170 lbs to 166 lbs over the past week.  She experiences occasional coughing episodes, which she attributes to environmental allergies. She uses a cough medicine as needed, which she finds more effective than the one with codeine . She does not cough daily, but when she does, it is persistent until treated.  She takes omeprazole  twice daily for stomach issues and reports no problems with food getting stuck.   She uses  Symbicort  daily for asthma management and needs a refill. 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   Mild intermittent asthma    OA (osteoarthritis)    fingers    Palpitations 09/04/2022   Trigger finger of both hands    wear slints and special gloves at night   Type 2 diabetes mellitus [facility])     Past Surgical History:  Procedure Laterality Date   BLADDER SURGERY  2012   in [place]   TURBT   CHOLECYSTECTOMY  11/26/1968   CYSTOSCOPY/RETROGRADE/URETEROSCOPY Left 05/24/2014   Procedure: CYSTOSCOPY/RETROGRADE/ URETEROSCOPY;  Surgeon: Garnette CHRISTELLA Matilda, MD;  Location: [facility];  Service: Urology;  Laterality: Left;   TRANSURETHRAL RESECTION OF BLADDER TUMOR N/A 05/24/2014   Procedure: TRANSURETHRAL RESECTION OF BLADDER TUMOR (TURBT) ;  Surgeon: Garnette CHRISTELLA Matilda, MD;  Location: [facility];  Service: Urology;  Laterality: N/A;   TRANSURETHRAL RESECTION OF BLADDER TUMOR N/A 08/04/2015   Procedure: TRANSURETHRAL RESECTION OF BLADDER TUMOR (  TURBT);  Surgeon: Garnette Shack, MD;  Location: [facility];  Service: Urology;  Laterality: N/A;   WRIST SURGERY Right    x3    Current Medications[1]  Allergies[2] ROS neg/noncontributory except as noted HPI/below      Objective:     BP 122/68 (BP Location: Left Arm, Patient Position: Sitting, Cuff Size: Normal)   Pulse 74   Temp (!) 97.1 F (36.2 C) (Temporal)   Ht 5' 3 (1.6 m)   Wt 165 lb 6 oz (75 kg)   LMP  (LMP Unknown)   SpO2 98%   BMI 29.29  kg/m  Wt Readings from Last 3 Encounters:  12/11/24 165 lb 6 oz (75 kg)  09/09/24 167 lb (75.8 kg)  06/15/24 165 lb (74.8 kg)    Physical Exam MEASUREMENTS: Weight- 166. GENERAL: Well developed well nourished no acute distress HEAD EYES EARS NOSE THROAT: Normocephalic atraumatic, conjunctiva not injected, sclera nonicteric CARDIAC: Regular rate and rhythm, S1 S2 present , no murmur, dorsalis pedis 2 plus bilaterally NECK: Supple, no thyromegaly, no nodes, no carotid bruits LUNGS: Clear to auscultation bilaterally, no wheezes ABDOMEN: Bowel sounds present, soft, non tender non distended, no hepatosplenomegaly, no masses EXTREMITIES: No edema MUSCULOSKELETAL: No gross abnormalities NEUROLOGICAL: Alert and oriented x3, cranial nerves II through XII intact PSYCHIATRIC: Normal mood, good eye contact       Assessment & Plan:  Type 2 diabetes mellitus with insulin therapy (HCC) -     Hemoglobin A1c -     Comprehensive metabolic panel with GFR -     Empagliflozin ; Take 1 tablet (10 mg total) by mouth daily before breakfast.  Dispense: 90 tablet; Refill: 1 -     Tirzepatide ; Inject 5 mg into the skin once a week.  Dispense: 6 mL; Refill: 0  Hypertension associated with diabetes (HCC)  Acquired hypothyroidism -     T4, free -     T3, free -     TSH -     Levothyroxine  Sodium; Take 1 tablet (50 mcg total) by mouth daily before breakfast. TAKE 1 TABLET BY MOUTH ONCE DAILY FOR THYROID   Dispense: 90 tablet; Refill: 1  Chronic cough  Moderate persistent asthma without complication -     Symbicort ; Inhale 2 puffs into the lungs in the morning and at bedtime.  Dispense: 33 g; Refill: 3  Hyperlipidemia associated with type 2 diabetes mellitus (HCC) -     Fenofibrate ; Take 1 tablet (160 mg total) by mouth daily.  Dispense: 90 tablet; Refill: 1  Gastroesophageal reflux disease without esophagitis -     Omeprazole ; Take 1 capsule (20 mg total) by mouth 2 (two) times daily before a meal.   Dispense: 180 capsule; Refill: 1  Encounter for long-term (current) use of insulin (HCC)  Long term current use of oral hypoglycemic drug  Long-term current use of injectable noninsulin antidiabetic medication  Other orders -     Promethazine -DM; Take 5 mLs by mouth at bedtime as needed for cough.  Dispense: 118 mL; Refill: 1    Assessment and Plan Assessment & Plan Type 2 diabetes mellitus with insulin therapy   Type 2 diabetes mellitus is managed with Lantus  38 units daily, empagliflozin  10 mg daily, and metformin  1000 mg twice daily. She will be switched from Ozempic  to Mounjaro  for better weight control, having lost weight from 170 lbs to 166 lbs over the past week. An A1c test was ordered.  Acquired hypothyroidism   She takes levothyroxine , currently  two 25 mcg tablets daily for a total of 50 mcg. Reports hair thinning, likely related to thyroid  function or age, chronic medical conditions. . Continue levothyroxine  50 mcg daily. Thyroid  function tests were ordered, and the pharmacy will be instructed to update the prescription to 50 mcg tablets depending on results.  Moderate persistent asthma   Asthma is managed with daily Symbicort . The Symbicort  prescription was refilled.  Chronic cough   She experiences an intermittent chronic cough, possibly due to environmental allergies. The cough is not daily but can be persistent. Cough medicine is used as needed and is effective. Continue current cough management as needed.  Gastroesophageal reflux disease   GERD is managed with omeprazole  twice daily. No current symptoms of food impaction or significant reflux. The omeprazole  prescription was refilled.     Return in about 3 months (around 03/11/2025) for chronic follow-up.  Teresa CHRISTELLA Carrel, MD     [1]  Current Outpatient Medications:    Accu-Chek Softclix Lancets lancets, , Disp: , Rfl:    albuterol  (PROVENTIL ) (2.5 MG/3ML) 0.083% nebulizer solution, Take 3 mLs (2.5 mg total) by  nebulization every 6 (six) hours as needed for wheezing or shortness of breath., Disp: 150 mL, Rfl: 1   albuterol  (VENTOLIN  HFA) 108 (90 Base) MCG/ACT inhaler, INHALE 2 PUFFS BY MOUTH EVERY 6 HOURS AS NEEDED FOR WHEEZING FOR SHORTNESS OF BREATH, Disp: 36 g, Rfl: 0   ALPRAZolam  (XANAX ) 1 MG tablet, TAKE 1/2 (ONE-HALF) TABLET BY MOUTH THREE TIMES DAILY AND 1 TABLET AT BEDTIME, Disp: 75 tablet, Rfl: 5   baclofen  (LIORESAL ) 10 MG tablet, TAKE 1/2 (ONE-HALF) TABLET BY MOUTH TWICE DAILY AS NEEDED FOR MUSCLE SPASM, Disp: 30 tablet, Rfl: 1   blood glucose meter kit and supplies, Dispense based on patient and insurance preference. Use up to four times daily as directed. (FOR ICD-10 E10.9, E11.9)., Disp: 1 each, Rfl: 0   Calcium  Carbonate (CALCIUM  600 PO), Take 1 tablet by mouth daily in the afternoon., Disp: , Rfl:    cholecalciferol (VITAMIN D3) 25 MCG (1000 UNIT) tablet, Take 1,000 Units by mouth daily., Disp: , Rfl:    Continuous Glucose Sensor (FREESTYLE LIBRE 3 PLUS SENSOR) MISC, Inject 1 each into the skin every 14 (fourteen) days. Change sensor every 15 days., Disp: 6 each, Rfl: 3   diltiazem  (CARTIA  XT) 240 MG 24 hr capsule, Take 1 capsule by mouth once daily, Disp: 90 capsule, Rfl: 2   EMBECTA PEN NEEDLE ULTRAFINE 31G X 5 MM MISC, USE 1 PEN NEEDLE ONCE DAILY, Disp: 100 each, Rfl: 3   fluticasone  (FLONASE ) 50 MCG/ACT nasal spray, Place into both nostrils daily., Disp: , Rfl:    furosemide  (LASIX ) 20 MG tablet, Take 1 tablet (20 mg total) by mouth 2 (two) times daily. (Patient taking differently: Take 20 mg by mouth as needed.), Disp: 180 tablet, Rfl: 1   glucose blood test strip, Use as instructed, Disp: 100 each, Rfl: 12   ipratropium (ATROVENT ) 0.06 % nasal spray, Place 2 sprays into both nostrils every 6 (six) hours as needed for rhinitis., Disp: 15 mL, Rfl: 12   LANTUS  SOLOSTAR 100 UNIT/ML Solostar Pen, Inject 40 Units into the skin daily., Disp: 45 mL, Rfl: 3   magnesium oxide (MAG-OX) 400 MG  tablet, Take 400 mg by mouth 2 (two) times daily., Disp: , Rfl:    metFORMIN  (GLUCOPHAGE ) 1000 MG tablet, TAKE 1 TABLET BY MOUTH TWICE DAILY WITH A MEAL, Disp: 180 tablet, Rfl: 1   metoprolol  succinate (  TOPROL  XL) 100 MG 24 hr tablet, Take 1 tablet (100 mg total) by mouth daily. Take with or immediately following a meal., Disp: 90 tablet, Rfl: 3   Multiple Vitamin (MULTIVITAMIN WITH MINERALS) TABS tablet, Take 1 tablet by mouth daily., Disp: , Rfl:    nystatin cream (MYCOSTATIN), Apply topically., Disp: , Rfl:    Omega 3 1000 MG CAPS, Take 1,000 mg by mouth daily., Disp: , Rfl:    Polyethylene Glycol 3350 (MIRALAX PO), Take by mouth every evening., Disp: , Rfl:    potassium chloride  (KLOR-CON  M) 10 MEQ tablet, Take 1 tablet (10 mEq total) by mouth 2 (two) times daily. (Patient taking differently: Take 10 mEq by mouth as needed (with furosemide ).), Disp: 180 tablet, Rfl: 1   promethazine -dextromethorphan (PROMETHAZINE -DM) 6.25-15 MG/5ML syrup, Take 5 mLs by mouth at bedtime as needed for cough., Disp: 118 mL, Rfl: 1   rosuvastatin  (CRESTOR ) 10 MG tablet, Take 1 tablet (10 mg total) by mouth daily., Disp: 90 tablet, Rfl: 1   sertraline  (ZOLOFT ) 50 MG tablet, Take 1 tablet (50 mg total) by mouth daily., Disp: 90 tablet, Rfl: 2   tirzepatide  (MOUNJARO ) 5 MG/0.5ML Pen, Inject 5 mg into the skin once a week., Disp: 6 mL, Rfl: 0   traMADol  (ULTRAM ) 50 MG tablet, Take 1 tablet by mouth every 6 (six) hours as needed., Disp: , Rfl:    triamcinolone cream (KENALOG) 0.1 %, Apply topically 3 (three) times daily., Disp: , Rfl:    valsartan  (DIOVAN ) 80 MG tablet, Take 1 tablet by mouth once daily, Disp: 90 tablet, Rfl: 2   VITAMIN E PO, Take by mouth., Disp: , Rfl:    empagliflozin  (JARDIANCE ) 10 MG TABS tablet, Take 1 tablet (10 mg total) by mouth daily before breakfast., Disp: 90 tablet, Rfl: 1   fenofibrate  160 MG tablet, Take 1 tablet (160 mg total) by mouth daily., Disp: 90 tablet, Rfl: 1   levothyroxine   (SYNTHROID ) 50 MCG tablet, Take 1 tablet (50 mcg total) by mouth daily before breakfast. TAKE 1 TABLET BY MOUTH ONCE DAILY FOR THYROID , Disp: 90 tablet, Rfl: 1   omeprazole  (PRILOSEC) 20 MG capsule, Take 1 capsule (20 mg total) by mouth 2 (two) times daily before a meal., Disp: 180 capsule, Rfl: 1   SYMBICORT  160-4.5 MCG/ACT inhaler, Inhale 2 puffs into the lungs in the morning and at bedtime., Disp: 33 g, Rfl: 3 [2]  Allergies Allergen Reactions   Celecoxib Anaphylaxis and Shortness Of Breath   Glipizide Itching   Penicillins Other (See Comments)    Burn marks from inside out    Sulfa Antibiotics Itching   "

## 2024-12-13 ENCOUNTER — Ambulatory Visit: Payer: Self-pay | Admitting: Family Medicine

## 2024-12-13 MED ORDER — LEVOTHYROXINE SODIUM 50 MCG PO TABS: 50.0000 ug | ORAL_TABLET | Freq: Every day | ORAL | 1 refills | Status: AC

## 2024-12-13 NOTE — Progress Notes (Signed)
 Thyroid  dose great-sent the 50 tablets A1C a little better and ok for age.

## 2024-12-14 NOTE — Progress Notes (Signed)
Pt has read results.

## 2024-12-18 ENCOUNTER — Other Ambulatory Visit: Payer: Self-pay

## 2024-12-18 ENCOUNTER — Observation Stay (HOSPITAL_COMMUNITY)
Admission: EM | Admit: 2024-12-18 | Discharge: 2024-12-21 | Disposition: A | Attending: Emergency Medicine | Admitting: Emergency Medicine

## 2024-12-18 DIAGNOSIS — I10 Essential (primary) hypertension: Secondary | ICD-10-CM | POA: Diagnosis not present

## 2024-12-18 DIAGNOSIS — Z87891 Personal history of nicotine dependence: Secondary | ICD-10-CM | POA: Diagnosis not present

## 2024-12-18 DIAGNOSIS — Z8659 Personal history of other mental and behavioral disorders: Secondary | ICD-10-CM | POA: Diagnosis not present

## 2024-12-18 DIAGNOSIS — Z79899 Other long term (current) drug therapy: Secondary | ICD-10-CM | POA: Diagnosis not present

## 2024-12-18 DIAGNOSIS — R531 Weakness: Secondary | ICD-10-CM | POA: Diagnosis present

## 2024-12-18 DIAGNOSIS — A419 Sepsis, unspecified organism: Secondary | ICD-10-CM | POA: Diagnosis not present

## 2024-12-18 DIAGNOSIS — Z794 Long term (current) use of insulin: Secondary | ICD-10-CM | POA: Insufficient documentation

## 2024-12-18 DIAGNOSIS — E039 Hypothyroidism, unspecified: Secondary | ICD-10-CM | POA: Diagnosis not present

## 2024-12-18 DIAGNOSIS — U071 COVID-19: Principal | ICD-10-CM | POA: Diagnosis present

## 2024-12-18 DIAGNOSIS — Z8551 Personal history of malignant neoplasm of bladder: Secondary | ICD-10-CM | POA: Diagnosis not present

## 2024-12-18 DIAGNOSIS — E785 Hyperlipidemia, unspecified: Secondary | ICD-10-CM | POA: Insufficient documentation

## 2024-12-18 DIAGNOSIS — Z7984 Long term (current) use of oral hypoglycemic drugs: Secondary | ICD-10-CM | POA: Insufficient documentation

## 2024-12-18 DIAGNOSIS — E119 Type 2 diabetes mellitus without complications: Secondary | ICD-10-CM | POA: Diagnosis not present

## 2024-12-18 DIAGNOSIS — Z8679 Personal history of other diseases of the circulatory system: Secondary | ICD-10-CM | POA: Insufficient documentation

## 2024-12-18 DIAGNOSIS — J45909 Unspecified asthma, uncomplicated: Secondary | ICD-10-CM | POA: Insufficient documentation

## 2024-12-18 NOTE — ED Triage Notes (Signed)
 Pt came in via EMS from home w/  c/o of a fall. Has been experiencing weakness and malaise. Reports fevers. No thinners.No head strike. No LOC.

## 2024-12-19 ENCOUNTER — Emergency Department (HOSPITAL_COMMUNITY)

## 2024-12-19 ENCOUNTER — Encounter (HOSPITAL_COMMUNITY): Payer: Self-pay | Admitting: Internal Medicine

## 2024-12-19 DIAGNOSIS — U071 COVID-19: Secondary | ICD-10-CM | POA: Diagnosis present

## 2024-12-19 LAB — URINALYSIS, W/ REFLEX TO CULTURE (INFECTION SUSPECTED)
Bacteria, UA: NONE SEEN
Bilirubin Urine: NEGATIVE
Glucose, UA: NEGATIVE mg/dL
Hgb urine dipstick: NEGATIVE
Ketones, ur: NEGATIVE mg/dL
Leukocytes,Ua: NEGATIVE
Nitrite: NEGATIVE
Protein, ur: NEGATIVE mg/dL
Specific Gravity, Urine: 1.008 (ref 1.005–1.030)
pH: 6 (ref 5.0–8.0)

## 2024-12-19 LAB — COMPREHENSIVE METABOLIC PANEL WITH GFR
ALT: 20 U/L (ref 0–44)
AST: 25 U/L (ref 15–41)
Albumin: 3.8 g/dL (ref 3.5–5.0)
Alkaline Phosphatase: 83 U/L (ref 38–126)
Anion gap: 13 (ref 5–15)
BUN: 16 mg/dL (ref 8–23)
CO2: 20 mmol/L — ABNORMAL LOW (ref 22–32)
Calcium: 9.2 mg/dL (ref 8.9–10.3)
Chloride: 106 mmol/L (ref 98–111)
Creatinine, Ser: 0.81 mg/dL (ref 0.44–1.00)
GFR, Estimated: >60 mL/min
Glucose, Bld: 198 mg/dL — ABNORMAL HIGH (ref 70–99)
Potassium: 3.8 mmol/L (ref 3.5–5.1)
Sodium: 139 mmol/L (ref 135–145)
Total Bilirubin: 0.4 mg/dL (ref 0.0–1.2)
Total Protein: 6.9 g/dL (ref 6.5–8.1)

## 2024-12-19 LAB — CBC WITH DIFFERENTIAL/PLATELET
Abs Immature Granulocytes: 0.05 10*3/uL (ref 0.00–0.07)
Basophils Absolute: 0.1 10*3/uL (ref 0.0–0.1)
Basophils Relative: 1 %
Eosinophils Absolute: 0.1 10*3/uL (ref 0.0–0.5)
Eosinophils Relative: 1 %
HCT: 41.1 % (ref 36.0–46.0)
Hemoglobin: 13.2 g/dL (ref 12.0–15.0)
Immature Granulocytes: 1 %
Lymphocytes Relative: 5 %
Lymphs Abs: 0.4 10*3/uL — ABNORMAL LOW (ref 0.7–4.0)
MCH: 26.6 pg (ref 26.0–34.0)
MCHC: 32.1 g/dL (ref 30.0–36.0)
MCV: 82.7 fL (ref 80.0–100.0)
Monocytes Absolute: 0.7 10*3/uL (ref 0.1–1.0)
Monocytes Relative: 8 %
Neutro Abs: 7.6 10*3/uL (ref 1.7–7.7)
Neutrophils Relative %: 84 %
Platelets: 219 10*3/uL (ref 150–400)
RBC: 4.97 MIL/uL (ref 3.87–5.11)
RDW: 14.5 % (ref 11.5–15.5)
WBC: 8.9 10*3/uL (ref 4.0–10.5)
nRBC: 0 % (ref 0.0–0.2)

## 2024-12-19 LAB — TSH: TSH: 0.806 u[IU]/mL (ref 0.350–4.500)

## 2024-12-19 LAB — CBC
HCT: 37.4 % (ref 36.0–46.0)
Hemoglobin: 12.2 g/dL (ref 12.0–15.0)
MCH: 26.6 pg (ref 26.0–34.0)
MCHC: 32.6 g/dL (ref 30.0–36.0)
MCV: 81.7 fL (ref 80.0–100.0)
Platelets: 239 10*3/uL (ref 150–400)
RBC: 4.58 MIL/uL (ref 3.87–5.11)
RDW: 14.8 % (ref 11.5–15.5)
WBC: 6.3 10*3/uL (ref 4.0–10.5)
nRBC: 0 % (ref 0.0–0.2)

## 2024-12-19 LAB — GLUCOSE, CAPILLARY
Glucose-Capillary: 117 mg/dL — ABNORMAL HIGH (ref 70–99)
Glucose-Capillary: 134 mg/dL — ABNORMAL HIGH (ref 70–99)

## 2024-12-19 LAB — CBG MONITORING, ED
Glucose-Capillary: 108 mg/dL — ABNORMAL HIGH (ref 70–99)
Glucose-Capillary: 147 mg/dL — ABNORMAL HIGH (ref 70–99)

## 2024-12-19 LAB — I-STAT CG4 LACTIC ACID, ED
Lactic Acid, Venous: 1.7 mmol/L (ref 0.5–1.9)
Lactic Acid, Venous: 2.1 mmol/L (ref 0.5–1.9)

## 2024-12-19 LAB — PROTIME-INR
INR: 1.1 (ref 0.8–1.2)
Prothrombin Time: 14.8 s (ref 11.4–15.2)

## 2024-12-19 LAB — PROCALCITONIN: Procalcitonin: 0.16 ng/mL

## 2024-12-19 LAB — CREATININE, SERUM
Creatinine, Ser: 0.78 mg/dL (ref 0.44–1.00)
GFR, Estimated: >60 mL/min

## 2024-12-19 LAB — RESP PANEL BY RT-PCR (RSV, FLU A&B, COVID)  RVPGX2
Influenza A by PCR: NEGATIVE
Influenza B by PCR: NEGATIVE
Resp Syncytial Virus by PCR: NEGATIVE
SARS Coronavirus 2 by RT PCR: POSITIVE — AB

## 2024-12-19 LAB — C-REACTIVE PROTEIN: CRP: 4.4 mg/dL — ABNORMAL HIGH

## 2024-12-19 LAB — T4, FREE: Free T4: 1.06 ng/dL (ref 0.80–2.00)

## 2024-12-19 MED ORDER — LACTATED RINGERS IV SOLN: INTRAVENOUS | Status: DC

## 2024-12-19 MED ORDER — ACETAMINOPHEN 325 MG PO TABS
650.0000 mg | ORAL_TABLET | Freq: Once | ORAL | Status: AC
  Administered 2024-12-19: 650 mg via ORAL
  Filled 2024-12-19: qty 2

## 2024-12-19 MED ORDER — ONDANSETRON HCL 4 MG/2ML IJ SOLN: 4.0000 mg | Freq: Four times a day (QID) | INTRAMUSCULAR | Status: DC | PRN

## 2024-12-19 MED ORDER — LACTATED RINGERS IV BOLUS
500.0000 mL | Freq: Once | INTRAVENOUS | Status: AC
  Administered 2024-12-19: 500 mL via INTRAVENOUS

## 2024-12-19 MED ORDER — ACETAMINOPHEN 650 MG RE SUPP: 650.0000 mg | Freq: Four times a day (QID) | RECTAL | Status: DC | PRN

## 2024-12-19 MED ORDER — HEPARIN SODIUM (PORCINE) 5000 UNIT/ML IJ SOLN
5000.0000 [IU] | Freq: Three times a day (TID) | INTRAMUSCULAR | Status: DC
  Administered 2024-12-19 – 2024-12-21 (×6): 5000 [IU] via SUBCUTANEOUS
  Filled 2024-12-19 (×6): qty 1

## 2024-12-19 MED ORDER — ACETAMINOPHEN 500 MG PO TABS
1000.0000 mg | ORAL_TABLET | Freq: Once | ORAL | Status: AC
  Administered 2024-12-19: 1000 mg via ORAL
  Filled 2024-12-19: qty 2

## 2024-12-19 MED ORDER — ALPRAZOLAM 0.5 MG PO TABS
0.5000 mg | ORAL_TABLET | Freq: Three times a day (TID) | ORAL | Status: DC | PRN
  Administered 2024-12-20 (×2): 0.5 mg via ORAL
  Filled 2024-12-19 (×2): qty 1

## 2024-12-19 MED ORDER — FLUTICASONE FUROATE-VILANTEROL 100-25 MCG/ACT IN AEPB: 1.0000 | INHALATION_SPRAY | Freq: Every day | RESPIRATORY_TRACT | Status: DC

## 2024-12-19 MED ORDER — INSULIN ASPART 100 UNIT/ML IJ SOLN
0.0000 [IU] | Freq: Three times a day (TID) | INTRAMUSCULAR | Status: DC
  Administered 2024-12-19: 2 [IU] via SUBCUTANEOUS
  Filled 2024-12-19: qty 2

## 2024-12-19 MED ORDER — BISACODYL 5 MG PO TBEC: 5.0000 mg | DELAYED_RELEASE_TABLET | Freq: Every day | ORAL | Status: DC | PRN

## 2024-12-19 MED ORDER — INSULIN GLARGINE-YFGN 100 UNIT/ML ~~LOC~~ SOLN
20.0000 [IU] | Freq: Two times a day (BID) | SUBCUTANEOUS | Status: DC
  Administered 2024-12-19 – 2024-12-20 (×3): 20 [IU] via SUBCUTANEOUS
  Filled 2024-12-19 (×5): qty 0.2

## 2024-12-19 MED ORDER — SODIUM CHLORIDE 0.9 % IV SOLN
2.0000 g | Freq: Once | INTRAVENOUS | Status: AC
  Administered 2024-12-19: 2 g via INTRAVENOUS
  Filled 2024-12-19: qty 10

## 2024-12-19 MED ORDER — INSULIN ASPART 100 UNIT/ML IJ SOLN: 0.0000 [IU] | Freq: Every day | INTRAMUSCULAR | Status: DC

## 2024-12-19 MED ORDER — ROSUVASTATIN CALCIUM 10 MG PO TABS
10.0000 mg | ORAL_TABLET | Freq: Every day | ORAL | Status: DC
  Administered 2024-12-19 – 2024-12-20 (×2): 10 mg via ORAL
  Filled 2024-12-19 (×3): qty 1

## 2024-12-19 MED ORDER — FENOFIBRATE 160 MG PO TABS
160.0000 mg | ORAL_TABLET | Freq: Every day | ORAL | Status: DC
  Administered 2024-12-20 – 2024-12-21 (×2): 160 mg via ORAL
  Filled 2024-12-19 (×2): qty 1

## 2024-12-19 MED ORDER — ONDANSETRON HCL 4 MG PO TABS: 4.0000 mg | ORAL_TABLET | Freq: Four times a day (QID) | ORAL | Status: DC | PRN

## 2024-12-19 MED ORDER — LACTATED RINGERS IV BOLUS
1000.0000 mL | Freq: Once | INTRAVENOUS | Status: AC
  Administered 2024-12-19: 1000 mL via INTRAVENOUS

## 2024-12-19 MED ORDER — ISOSORBIDE MONONITRATE ER 30 MG PO TB24: 30.0000 mg | ORAL_TABLET | Freq: Every day | ORAL | Status: DC

## 2024-12-19 MED ORDER — METOPROLOL SUCCINATE ER 50 MG PO TB24
50.0000 mg | ORAL_TABLET | Freq: Every day | ORAL | Status: DC
  Administered 2024-12-19 – 2024-12-21 (×3): 50 mg via ORAL
  Filled 2024-12-19 (×3): qty 1

## 2024-12-19 MED ORDER — VANCOMYCIN HCL IN DEXTROSE 1-5 GM/200ML-% IV SOLN
1000.0000 mg | Freq: Once | INTRAVENOUS | Status: AC
  Administered 2024-12-19: 1000 mg via INTRAVENOUS
  Filled 2024-12-19: qty 200

## 2024-12-19 MED ORDER — PANTOPRAZOLE SODIUM 40 MG PO TBEC
40.0000 mg | DELAYED_RELEASE_TABLET | Freq: Every day | ORAL | Status: DC
  Administered 2024-12-19 – 2024-12-21 (×3): 40 mg via ORAL
  Filled 2024-12-19 (×3): qty 1

## 2024-12-19 MED ORDER — BUDESONIDE-FORMOTEROL FUMARATE 160-4.5 MCG/ACT IN AERO
2.0000 | INHALATION_SPRAY | Freq: Two times a day (BID) | RESPIRATORY_TRACT | Status: DC
  Administered 2024-12-19 – 2024-12-21 (×3): 2 via RESPIRATORY_TRACT

## 2024-12-19 MED ORDER — LEVOTHYROXINE SODIUM 50 MCG PO TABS
50.0000 ug | ORAL_TABLET | Freq: Every day | ORAL | Status: DC
  Administered 2024-12-20 – 2024-12-21 (×2): 50 ug via ORAL
  Filled 2024-12-19 (×2): qty 1

## 2024-12-19 MED ORDER — IBUPROFEN 200 MG PO TABS
400.0000 mg | ORAL_TABLET | ORAL | Status: DC | PRN
  Administered 2024-12-19 – 2024-12-20 (×2): 400 mg via ORAL
  Filled 2024-12-19 (×2): qty 2

## 2024-12-19 MED ORDER — HYDRALAZINE HCL 20 MG/ML IJ SOLN: 10.0000 mg | Freq: Four times a day (QID) | INTRAMUSCULAR | Status: DC | PRN

## 2024-12-19 MED ORDER — SERTRALINE HCL 50 MG PO TABS
50.0000 mg | ORAL_TABLET | Freq: Every day | ORAL | Status: DC
  Administered 2024-12-19 – 2024-12-21 (×3): 50 mg via ORAL
  Filled 2024-12-19 (×3): qty 1

## 2024-12-19 MED ORDER — METRONIDAZOLE 500 MG/100ML IV SOLN
500.0000 mg | Freq: Once | INTRAVENOUS | Status: AC
  Administered 2024-12-19: 500 mg via INTRAVENOUS
  Filled 2024-12-19: qty 100

## 2024-12-19 MED ORDER — ALBUTEROL SULFATE (2.5 MG/3ML) 0.083% IN NEBU: 3.0000 mL | INHALATION_SOLUTION | Freq: Four times a day (QID) | RESPIRATORY_TRACT | Status: DC | PRN

## 2024-12-19 MED ORDER — BACLOFEN 10 MG PO TABS
5.0000 mg | ORAL_TABLET | Freq: Two times a day (BID) | ORAL | Status: DC
  Administered 2024-12-19 – 2024-12-21 (×5): 5 mg via ORAL
  Filled 2024-12-19 (×5): qty 1

## 2024-12-19 MED ORDER — GUAIFENESIN-DM 100-10 MG/5ML PO SYRP
10.0000 mL | ORAL_SOLUTION | Freq: Three times a day (TID) | ORAL | Status: DC | PRN
  Administered 2024-12-20: 10 mL via ORAL
  Filled 2024-12-19: qty 10

## 2024-12-19 MED ORDER — DILTIAZEM HCL ER COATED BEADS 240 MG PO CP24
240.0000 mg | ORAL_CAPSULE | Freq: Every day | ORAL | Status: DC
  Administered 2024-12-19 – 2024-12-20 (×2): 240 mg via ORAL
  Filled 2024-12-19 (×2): qty 1

## 2024-12-19 MED ORDER — ACETAMINOPHEN 325 MG PO TABS
650.0000 mg | ORAL_TABLET | Freq: Four times a day (QID) | ORAL | Status: DC | PRN
  Administered 2024-12-19: 650 mg via ORAL
  Filled 2024-12-19 (×2): qty 2

## 2024-12-19 MED ORDER — LACTATED RINGERS IV SOLN: INTRAVENOUS | Status: AC

## 2024-12-19 NOTE — Plan of Care (Signed)
  Problem: Education: Goal: Knowledge of risk factors and measures for prevention of condition will improve Outcome: Progressing   Problem: Coping: Goal: Psychosocial and spiritual needs will be supported Outcome: Progressing   Problem: Respiratory: Goal: Will maintain a patent airway Outcome: Progressing   Problem: Education: Goal: Ability to describe self-care measures that may prevent or decrease complications (Diabetes Survival Skills Education) will improve Outcome: Progressing

## 2024-12-19 NOTE — Sepsis Progress Note (Signed)
 Elink monitoring for the code sepsis protocol.

## 2024-12-19 NOTE — ED Notes (Signed)
 Patient taken off oxygen at this time.  Was able to use bedside commode and return to bed without difficulty.

## 2024-12-19 NOTE — ED Notes (Signed)
 Inserted 2011 and 2008 for culture collection. Actually collected at 0011 and 0008.

## 2024-12-19 NOTE — Evaluation (Signed)
 Physical Therapy One Time Evaluation Patient Details Name: CLARECE DRZEWIECKI MRN: [id number] DOB: August 27, 1946 Today's Date: 12/19/2024  History of Present Illness  Glenola Wheat  is a 79 y.o. female, presented to the ER where she was diagnosed with acute COVID-19 infection along with generalized weakness, dehydration and a fall the day before admission. PHMx:DM type II,HTN, dyslipidemia, GERD, osteoarthritis, history of sinus tachycardia, mild asthma  Clinical Impression  Patient evaluated by Physical Therapy with no further acute PT needs identified. All education has been completed and the patient has no further questions.  Pt pleasant and very eager to discharge home as soon as possible.  Pt ambulated good distance in hallway and mildly unsteady however suspect this is pt's baseline (also no overt LOB observed, and pt adamantly declines use of assistive device).  Pt feels safe to return home at d/c.  Daughter present initially and stated pt could return home with her if pt needed assist however she'd prefer pt go home while still contagious.  Pt appears close to baseline. SPO2 97% on room air pregait and 96% room air postgait.  No further f/u PT or DME needs identified at this time. PT is signing off. Thank you for this referral.         If plan is discharge home, recommend the following:     Can travel by private vehicle        Equipment Recommendations None recommended by PT  Recommendations for Other Services       Functional Status Assessment Patient has had a recent decline in their functional status and demonstrates the ability to make significant improvements in function in a reasonable and predictable amount of time.     Precautions / Restrictions Precautions Precautions: Fall      Mobility  Bed Mobility Overal bed mobility: Independent                  Transfers Overall transfer level: Needs assistance Equipment used: None Transfers: Sit to/from Stand Sit to  Stand: Contact guard assist, Supervision           General transfer comment: CGA for safety    Ambulation/Gait Ambulation/Gait assistance: Contact guard assist Gait Distance (Feet): 350 Feet Assistive device: None Gait Pattern/deviations: Step-through pattern, Decreased stride length       General Gait Details: mildly unsteady intitially however pt reports aching posterior knees and sore feet, pt feels close to baseline, SpO2 97% on room air pregait and 96% room air postgait  Stairs            Wheelchair Mobility     Tilt Bed    Modified Rankin (Stroke Patients Only)       Balance Overall balance assessment: History of Falls                                           Pertinent Vitals/Pain Pain Assessment Pain Assessment: Faces Faces Pain Scale: Hurts little more Pain Location: headache Pain Descriptors / Indicators: Aching Pain Intervention(s): Repositioned, Patient requesting pain meds-RN notified    Home Living Family/patient expects to be discharged to:: Private residence Living Arrangements: Alone   Type of Home: Apartment Home Access: Level entry       Home Layout: One level Home Equipment: None      Prior Function Prior Level of Function : Independent/Modified Independent;Driving  Extremity/Trunk Assessment   Upper Extremity Assessment Upper Extremity Assessment: Overall WFL for tasks assessed    Lower Extremity Assessment Lower Extremity Assessment: Generalized weakness       Communication   Communication Communication: No apparent difficulties    Cognition Arousal: Alert     PT - Cognitive impairments: No apparent impairments                         Following commands: Intact       Cueing       General Comments      Exercises     Assessment/Plan    PT Assessment Patient does not need any further PT services  PT Problem List         PT Treatment  Interventions      PT Goals (Current goals can be found in the Care Plan section)  Acute Rehab PT Goals PT Goal Formulation: All assessment and education complete, DC therapy    Frequency       Co-evaluation               AM-PAC PT 6 Clicks Mobility  Outcome Measure Help needed turning from your back to your side while in a flat bed without using bedrails?: None Help needed moving from lying on your back to sitting on the side of a flat bed without using bedrails?: None Help needed moving to and from a bed to a chair (including a wheelchair)?: None Help needed standing up from a chair using your arms (e.g., wheelchair or bedside chair)?: None Help needed to walk in hospital room?: A Little Help needed climbing 3-5 steps with a railing? : A Little 6 Click Score: 22    End of Session Equipment Utilized During Treatment: Gait belt Activity Tolerance: Patient tolerated treatment well Patient left: in chair;with call bell/phone within reach;with chair alarm set Nurse Communication: Mobility status PT Visit Diagnosis: Difficulty in walking, not elsewhere classified (R26.2)    Time: 1530-1550 PT Time Calculation (min) (ACUTE ONLY): 20 min   Charges:   PT Evaluation $PT Eval Low Complexity: 1 Low   PT General Charges $$ ACUTE PT VISIT: 1 Visit        Tari PT, DPT Physical Therapist Acute Rehabilitation Services Office: [phone]   Tari CROME Payson 12/19/2024, 4:22 PM

## 2024-12-19 NOTE — Evaluation (Signed)
 Occupational Therapy Evaluation Patient Details Name: Teresa Rice MRN: [id number] DOB: [birth year] Today's Date: 12/19/2024   History of Present Illness   Teresa Rice  is a 79 y.o. female, presented to the ER where she was diagnosed with acute COVID-19 infection along with generalized weakness, dehydration and a fall the day before admission. PHMx:DM type II,HTN, dyslipidemia, GERD, osteoarthritis, history of sinus tachycardia, mild asthma     Clinical Impressions This 78 yo female admitted with above presents to acute OT with PLOF of being totally independent with basic ADLs, IADLs and driving. Currently she is a CGA level due to mild impulsivity (moves rather fast) and mild balance issues for basic ADLs. She will continue to benefit from acute OT without need for follow up.     If plan is discharge home, recommend the following:   A little help with walking and/or transfers;A little help with bathing/dressing/bathroom     Functional Status Assessment   Patient has had a recent decline in their functional status and demonstrates the ability to make significant improvements in function in a reasonable and predictable amount of time.     Equipment Recommendations   None recommended by OT      Precautions/Restrictions   Precautions Precautions: Fall Recall of Precautions/Restrictions: Impaired Restrictions Weight Bearing Restrictions Per Provider Order: No     Mobility Bed Mobility Overal bed mobility: Independent                  Transfers Overall transfer level: Needs assistance   Transfers: Sit to/from Stand Sit to Stand: Contact guard assist           General transfer comment: CGA ambulating to/from bathroom to bed without AD      Balance Overall balance assessment: Mild deficits observed, not formally tested (in standing)                                         ADL either performed or assessed with clinical judgement    ADL                                         General ADL Comments: overall at a CGA level due to mild decreased balance when up on her feet     Vision Patient Visual Report: No change from baseline              Pertinent Vitals/Pain Pain Assessment Pain Assessment:  (reports pain in her joints)     Extremity/Trunk Assessment Upper Extremity Assessment Upper Extremity Assessment: Overall WFL for tasks assessed           Communication Communication Communication: No apparent difficulties   Cognition Arousal: Alert Behavior During Therapy: Impulsive Cognition: No apparent impairments                               Following commands: Intact                  Home Living Family/patient expects to be discharged to:: Private residence Living Arrangements: Alone   Type of Home: Apartment Home Access: Level entry     Home Layout: One level     Bathroom Shower/Tub: Producer, Television/film/video: Standard  Home Equipment: None          Prior Functioning/Environment Prior Level of Function : Independent/Modified Independent;Driving                    OT Problem List: Impaired balance (sitting and/or standing)   OT Treatment/Interventions: Self-care/ADL training;DME and/or AE instruction;Balance training;Patient/family education      OT Goals(Current goals can be found in the care plan section)   Acute Rehab OT Goals Patient Stated Goal: to go home OT Goal Formulation: With patient Time For Goal Achievement: 01/02/25 Potential to Achieve Goals: Good   OT Frequency:  Min 2X/week       AM-PAC OT 6 Clicks Daily Activity     Outcome Measure Help from another person eating meals?: None Help from another person taking care of personal grooming?: A Little Help from another person toileting, which includes using toliet, bedpan, or urinal?: A Little Help from another person bathing (including washing,  rinsing, drying)?: A Little Help from another person to put on and taking off regular upper body clothing?: A Little Help from another person to put on and taking off regular lower body clothing?: A Little 6 Click Score: 19   End of Session Nurse Communication: Mobility status  Activity Tolerance: Patient tolerated treatment well Patient left: in bed;with call bell/phone within reach;with bed alarm set  OT Visit Diagnosis: Unsteadiness on feet (R26.81);Other abnormalities of gait and mobility (R26.89)                Time: 8564-8554 OT Time Calculation (min): 10 min Charges:  OT General Charges $OT Visit: 1 Visit OT Evaluation $OT Eval Moderate Complexity: 1 Mod  [name] OT Acute Rehabilitation Services Office [phone]    [place] Teresa Rice 12/19/2024, 3:54 PM

## 2024-12-19 NOTE — ED Notes (Signed)
 Patient assisted to use bedside commode at this time.

## 2024-12-19 NOTE — H&P (Addendum)
 "                                                                                                                                                                                                                                                                                                                                                                         Tele-Hospitalist H&P Note    Patient Demographics:   Teresa Rice, is a 79 y.o. female  MRN: [id number]   DOB - [birth year]  Referring Provider: EDP Telemedicine Provider: Lavada Stank M.D   Patient Location:  [facility]  Referring Diagnosis:    Chief Complaint  Patient presents with   Fall   Weakness    Patient Name and DOB verified: Teresa Rice,  DOB - May 13, 1946    Patient consented to Telemedicine Evaluation: Yes  RN virtual assistant:   Julane Butt R  Video encounter time and date: 12/19/2024 at 9:49 AM       HPI:    Teresa Rice  is a 79 y.o. female, with history of DM type II on insulin , hypertension, dyslipidemia, GERD, osteoarthritis, history of sinus tachycardia, mild asthma, denies any history of atrial fibrillation.  Patient with above history who lives alone but was exposed to her great granddaughter few days ago who had a fever and was sick, developed low-grade fever, chills and cough about 2 to 3 days ago, followed by feelings of generalized weakness, incurred a fall at home yesterday without injuring herself, presented to the ER where she was diagnosed with acute COVID-19 infection along with generalized weakness, dehydration and I was requested to admit the patient.  Patient currently feeling better denies any cough or shortness of breath, she is feeling good and wants to  actually go home today if possible, denies any chest pain or palpitations, no abdominal pain or discomfort, no diarrhea or dysuria, no focal weakness.  After getting some fluids she is already feeling better getting admitted for COVID-19  infection causing dehydration and generalized weakness.    Review of systems:    A full 10 point Review of Systems was done, except as stated above, all other Review of Systems were negative.   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   Mild intermittent asthma    OA (osteoarthritis)    fingers    Palpitations 09/04/2022   Trigger finger of both hands    wear slints and special gloves at night   Type 2 diabetes mellitus [facility])       Past Surgical History:  Procedure Laterality Date   BLADDER SURGERY  2012   in [place]   TURBT   CHOLECYSTECTOMY  11/26/1968   CYSTOSCOPY/RETROGRADE/URETEROSCOPY Left 05/24/2014   Procedure: CYSTOSCOPY/RETROGRADE/ URETEROSCOPY;  Surgeon: Garnette CHRISTELLA Matilda, MD;  Location: [facility];  Service: Urology;  Laterality: Left;   TRANSURETHRAL RESECTION OF BLADDER TUMOR N/A 05/24/2014   Procedure: TRANSURETHRAL RESECTION OF BLADDER TUMOR (TURBT) ;  Surgeon: Garnette CHRISTELLA Matilda, MD;  Location: [facility];  Service: Urology;  Laterality: N/A;   TRANSURETHRAL RESECTION OF BLADDER TUMOR N/A 08/04/2015   Procedure: TRANSURETHRAL RESECTION OF BLADDER TUMOR (TURBT);  Surgeon: Garnette Matilda, MD;  Location: [facility];  Service: Urology;  Laterality: N/A;   WRIST SURGERY Right    x3      Social History:     Social History   Tobacco Use   Smoking status: Former    Current packs/day: 0.00    Types: Cigarettes    Start date: 05/21/1964    Quit date: 05/21/1994    Years since quitting: 30.6   Smokeless tobacco: Never  Substance Use Topics   Alcohol use: Yes    Comment: RARE        Family  History :     Family History  Problem Relation Age of Onset   Early death Mother    AAA (abdominal aortic aneurysm) Sister    Cancer Father    Diabetes Brother    Cancer Brother        Home Medications:   Prior to Admission medications  Medication Sig Start Date End Date Taking? Authorizing Provider  albuterol  (PROVENTIL ) (2.5 MG/3ML) 0.083% nebulizer solution Take 3 mLs (2.5 mg total) by nebulization every 6 (six) hours as needed for wheezing or shortness of breath. 09/09/24  Yes Wendolyn Jenkins Jansky, MD  albuterol  (VENTOLIN  HFA) 108 [phone] Base) MCG/ACT inhaler INHALE 2 PUFFS BY MOUTH EVERY 6 HOURS AS NEEDED FOR WHEEZING FOR SHORTNESS OF BREATH 12/08/24  Yes Wendolyn Jenkins Jansky, MD  ALPRAZolam  (XANAX ) 1 MG tablet TAKE 1/2 (ONE-HALF) TABLET BY MOUTH THREE TIMES DAILY AND 1 TABLET AT BEDTIME 07/28/24  Yes Cottle, Lorene KANDICE Raddle., MD  baclofen  (LIORESAL ) 10 MG tablet TAKE 1/2 (ONE-HALF) TABLET BY MOUTH TWICE DAILY AS NEEDED FOR MUSCLE SPASM 11/01/24  Yes Wendolyn Jenkins Jansky, MD  blood  glucose meter kit and supplies Dispense based on patient and insurance preference. Use up to four times daily as directed. (FOR ICD-10 E10.9, E11.9). 02/08/21  Yes Waddell Rake, MD  Calcium  Carbonate (CALCIUM  600 PO) Take 1 tablet by mouth daily in the afternoon.   Yes [provider]  cholecalciferol (VITAMIN D3) 25 MCG (1000 UNIT) tablet Take 1,000 Units by mouth daily.   Yes [provider]  Continuous Glucose Sensor (FREESTYLE LIBRE 3 PLUS SENSOR) MISC Inject 1 each into the skin every 14 (fourteen) days. Change sensor every 15 days. 06/22/24  Yes Wendolyn Jenkins Jansky, MD  diltiazem  (CARTIA  XT) 240 MG 24 hr capsule Take 1 capsule by mouth once daily 04/28/24  Yes Raford Riggs, MD  empagliflozin  (JARDIANCE ) 10 MG TABS tablet Take 1 tablet (10 mg total) by mouth daily before breakfast. 12/11/24  Yes Wendolyn Jenkins Jansky, MD  fenofibrate  160 MG tablet Take 1 tablet (160 mg total) by mouth daily. 12/11/24  Yes Wendolyn Jenkins Jansky, MD  fluticasone  (FLONASE ) 50 MCG/ACT nasal spray Place into both nostrils daily.   Yes [provider]  furosemide  (LASIX ) 20 MG tablet Take 1 tablet (20 mg total) by mouth 2 (two) times daily. Patient taking differently: Take 20 mg by mouth as needed. 06/01/24  Yes Wendolyn Jenkins Jansky, MD  ipratropium (ATROVENT ) 0.06 % nasal spray Place 2 sprays into both nostrils every 6 (six) hours as needed for rhinitis. Patient taking differently: Place 2 sprays into both nostrils daily. 11/29/23  Yes Wendolyn Jenkins Jansky, MD  LANTUS  SOLOSTAR 100 UNIT/ML Solostar Pen Inject 40 Units into the skin daily. Patient taking differently: Inject 38 Units into the skin daily. 06/01/24  Yes Wendolyn Jenkins Jansky, MD  levothyroxine  (SYNTHROID ) 50 MCG tablet Take 1 tablet (50 mcg total) by mouth daily before breakfast. TAKE 1 TABLET BY MOUTH ONCE DAILY FOR THYROID  12/13/24  Yes Wendolyn Jenkins Jansky, MD  magnesium oxide (MAG-OX) 400 MG tablet Take 400 mg by mouth 2 (two) times daily.   Yes [provider]  metFORMIN  (GLUCOPHAGE ) 1000 MG tablet TAKE 1 TABLET BY MOUTH TWICE DAILY WITH A MEAL 11/18/24  Yes Wendolyn Jenkins Jansky, MD  metoprolol  succinate (TOPROL  XL) 100 MG 24 hr tablet Take 1 tablet (100 mg total) by mouth daily. Take with or immediately following a meal. 10/26/24  Yes Walker, Caitlin S, NP  Multiple Vitamin (MULTIVITAMIN WITH MINERALS) TABS tablet Take 1 tablet by mouth daily.   Yes [provider]  Omega 3 1000 MG CAPS Take 1,000 mg by mouth daily.   Yes [provider]  omeprazole  (PRILOSEC) 20 MG capsule Take 1 capsule (20 mg total) by mouth 2 (two) times daily before a meal. 12/11/24  Yes Wendolyn Jenkins Jansky, MD  Polyethylene Glycol 3350 (MIRALAX PO) Take 17 g by mouth every evening.   Yes [provider]  potassium chloride  (KLOR-CON  M) 10 MEQ tablet Take 1 tablet (10 mEq total) by mouth 2 (two) times daily. Patient taking differently: Take 10 mEq by mouth as needed (with  furosemide ). 06/01/24  Yes Wendolyn Jenkins Jansky, MD  promethazine -dextromethorphan  (PROMETHAZINE -DM) 6.25-15 MG/5ML syrup Take 5 mLs by mouth at bedtime as needed for cough. 12/11/24  Yes Wendolyn Jenkins Jansky, MD  rosuvastatin  (CRESTOR ) 10 MG tablet Take 1 tablet (10 mg total) by mouth daily. 09/09/24  Yes Wendolyn Jenkins Jansky, MD  sertraline  (ZOLOFT ) 50 MG tablet Take 1 tablet (50 mg total) by mouth daily. 07/28/24  Yes Cottle, Lorene KANDICE Raddle., MD  SYMBICORT  160-4.5 MCG/ACT inhaler Inhale 2 puffs into the lungs in the morning and at bedtime. 12/11/24  Yes Wendolyn Jenkins Jansky, MD  tirzepatide  (MOUNJARO ) 5 MG/0.5ML Pen Inject 5 mg into the skin once a week. 12/11/24  Yes Wendolyn Jenkins Jansky, MD  traMADol  (ULTRAM ) 50 MG tablet Take 1 tablet by mouth every 6 (six) hours as needed. 10/22/16  Yes [provider]  valsartan  (DIOVAN ) 80 MG tablet Take 1 tablet by mouth once daily 10/16/24  Yes Walker, Caitlin S, NP  VITAMIN E PO Take by mouth.   Yes [provider]  Accu-Chek Softclix Lancets lancets  08/28/19   [provider]  EMBECTA PEN NEEDLE ULTRAFINE 31G X 5 MM MISC USE 1 PEN NEEDLE ONCE DAILY Patient not taking: Reported on 12/19/2024 09/25/24   Wendolyn Jenkins Jansky, MD  glucose blood test strip Use as instructed Patient not taking: Reported on 12/19/2024 03/24/21   Waddell Rake, MD  nystatin cream (MYCOSTATIN) Apply topically. Patient not taking: Reported on 12/19/2024 05/17/22   [provider]  triamcinolone cream (KENALOG) 0.1 % Apply topically 3 (three) times daily. Patient not taking: Reported on 12/19/2024 05/17/22   [provider]     Allergies:    Allergies[1]   Physical Exam: Assisted by patient's RN over the camera   Vitals  Blood pressure 123/60, pulse 93, temperature (!) 102.9 F (39.4 C), temperature source Oral, resp. rate (!) 29, SpO2 96%.   Virtual exam done over Video assisted by RN  1. General Elderly white female lying in hospital bed in no apparent  distress, non toxic - wants to go home ASAP  2. Normal affect and insight, Not Suicidal or Homicidal, Awake Alert,   3. No F.N deficits, Strength & Sensation equally intact all 4 extremities   4. Ears and Eyes appear Normal,   5. Supple Neck,    6. Symmetrical Chest wall movement, Good air movement bilaterally, CTAB.  7. RRR, No Murmurs,   8. Positive Bowel Sounds, Abdomen Soft, No tenderness,    9. Normal ROM.       Data Review:   Recent Labs  Lab 12/19/24 0010 12/19/24 0902  WBC 8.9 6.3  HGB 13.2 12.2  HCT 41.1 37.4  PLT 219 239  MCV 82.7 81.7  MCH 26.6 26.6  MCHC 32.1 32.6  RDW 14.5 14.8  LYMPHSABS 0.4*  --   MONOABS 0.7  --   EOSABS 0.1  --   BASOSABS 0.1  --     Recent Labs  Lab 12/19/24 0010 12/19/24 0019 12/19/24 0219 12/19/24 0821  NA 139  --   --   --   K 3.8  --   --   --   CL 106  --   --   --   CO2 20*  --   --   --   ANIONGAP 13  --   --   --   GLUCOSE 198*  --   --   --   BUN 16  --   --   --   CREATININE 0.81  --   --  0.78  AST 25  --   --   --   ALT 20  --   --   --   ALKPHOS 83  --   --   --   BILITOT 0.4  --   --   --   ALBUMIN 3.8  --   --   --   PROCALCITON  --   --   --  0.16  LATICACIDVEN  --  2.1* 1.7  --   INR 1.1  --   --   --   TSH  --   --   --  0.806  CALCIUM  9.2  --   --   --     Recent Labs  Lab 12/19/24 0010 12/19/24 0019 12/19/24 0219 12/19/24 0821  PROCALCITON  --   --   --  0.16  LATICACIDVEN  --  2.1* 1.7  --   INR 1.1  --   --   --   TSH  --   --   --  0.806  CALCIUM  9.2  --   --   --     Recent Labs  Lab 12/19/24 0010 12/19/24 0019 12/19/24 0219 12/19/24 0821 12/19/24 0902  WBC 8.9  --   --   --  6.3  PLT 219  --   --   --  239  PROCALCITON  --   --   --  0.16  --   LATICACIDVEN  --  2.1* 1.7  --   --   CREATININE 0.81  --   --  0.78  --     Urinalysis    Component Value Date/Time   COLORURINE STRAW (A) 12/19/2024 0752   APPEARANCEUR HAZY (A) 12/19/2024 0752   LABSPEC 1.008  12/19/2024 0752   PHURINE 6.0 12/19/2024 0752   GLUCOSEU NEGATIVE 12/19/2024 0752   HGBUR NEGATIVE 12/19/2024 0752   BILIRUBINUR NEGATIVE 12/19/2024 0752   KETONESUR NEGATIVE 12/19/2024 0752   PROTEINUR NEGATIVE 12/19/2024 0752   NITRITE NEGATIVE 12/19/2024 0752   LEUKOCYTESUR NEGATIVE 12/19/2024 0752      Imaging Results:    DG Chest Port 1 View if patient is in a treatment room. Result Date: 12/19/2024 EXAM: 1 VIEW(S) XRAY OF THE CHEST 12/19/2024 12:18:00 AM COMPARISON: 06/08/2024 CLINICAL HISTORY: Suspected sepsis. FINDINGS: LUNGS AND PLEURA: Low lung volumes. Stable diffuse interstitial coarsening. No focal pulmonary opacity. No pleural effusion. No pneumothorax. HEART AND MEDIASTINUM: Aortic atherosclerotic calcification. BONES AND SOFT TISSUES: No acute osseous abnormality. IMPRESSION: 1. No acute cardiopulmonary abnormality. 2. Stable diffuse interstitial coarsening. Electronically signed by: Oneil Devonshire MD 12/19/2024 12:25 AM EST RP Workstation: [id number]    My personal review of EKG: Rhythm ST, 112/min,   no Acute ST changes   Assessment & Plan:   1.  Acute COVID-19 infection.  Does not seem to have significant pulmonary involvement at this time, however it has caused poor appetite, dehydration and generalized weakness.  Monitor inflammatory markers, IV fluids, PT OT eval and treat, telemetry monitoring.  For now refrain from using steroids or remdesivir.  Monitor inflammatory markers closely.  2.  History of sinus tachycardia and hypertension.  Home beta-blocker and calcium  channel blocker continued.  Hydrate and monitor.  Check TSH along with free T4.   3.  Dyslipidemia.  Home medications including statin continued.  4.  Fall at home yesterday.  No injuries, PT eval.  5.  DM type II.  Insulin -dependent, change Lantus  to twice daily along with sliding scale.  Monitor and adjust.  6.  Hypothyroidism.  Continue home Synthroid , check TSH and free T4.  7.  History of anxiety.   Home benzodiazepine continued.  8.  History of asthma, currently no wheezing, nebulizer treatments and oxygen supplemental as needed.  Monitor.  9. HTN - in poor control, home B Blocker, Hydralazine  PRN, hold ARB as dehydrated.  DVT Prophylaxis Heparin    AM Labs Ordered, also  please review Full Orders  Family Communication: Admission, patients condition and plan of care including tests being ordered have been discussed with the patient who indicates understanding and agree with the plan and Code Status.  Code Status DNR  Likely DC to  Home  Condition Fair  Consults called: None    Admission status: Obs    Time spent in minutes : 40  Signature  -    Lavada Stank M.D on 12/19/2024 at 9:49 AM   -  To page go to [email or web address]             [1]  Allergies Allergen Reactions   Celecoxib Anaphylaxis and Shortness Of Breath   Glipizide Itching   Penicillins Other (See Comments)    Burn marks from inside out    Sulfa Antibiotics Itching   "

## 2024-12-19 NOTE — ED Notes (Signed)
 PT went to bathroom but didn't collect urine sample

## 2024-12-19 NOTE — ED Provider Notes (Addendum)
 " [place] EMERGENCY DEPARTMENT AT [facility] Provider Note   CSN: [id number] Arrival date & time: 12/18/24  2342     Patient presents with: Fall and Weakness   CODI FOLKERTS is a 79 y.o. female.   The history is provided by the patient.  Fall This is a new problem. The problem occurs rarely. The problem has been resolved. Nothing aggravates the symptoms. Nothing relieves the symptoms. She has tried nothing for the symptoms. The treatment provided no relief.  Patient with h/o DM and bladder cancer presents with sepsis.  Fever, cough, global weakness and fall.      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 Authorizing Provider  Accu-Chek Softclix Lancets lancets  08/28/19   [provider]  albuterol  (PROVENTIL ) (2.5 MG/3ML) 0.083% nebulizer solution Take 3 mLs (2.5 mg total) by nebulization every 6 (six) hours as needed for wheezing or shortness of breath. 09/09/24   Wendolyn Jenkins Jansky, MD  albuterol  (VENTOLIN  HFA) 108 [phone] Base) MCG/ACT inhaler INHALE 2 PUFFS BY MOUTH EVERY 6 HOURS AS NEEDED FOR WHEEZING FOR SHORTNESS OF BREATH 12/08/24   Wendolyn Jenkins Jansky, MD  ALPRAZolam  (XANAX ) 1 MG tablet TAKE 1/2 (ONE-HALF) TABLET BY MOUTH THREE TIMES DAILY AND 1 TABLET AT BEDTIME 07/28/24   Cottle, Lorene KANDICE Raddle., MD  baclofen  (LIORESAL ) 10 MG tablet TAKE 1/2 (ONE-HALF) TABLET BY MOUTH TWICE DAILY AS  NEEDED FOR MUSCLE SPASM 11/01/24   Wendolyn Jenkins Jansky, MD  blood glucose meter kit and supplies Dispense based on patient and insurance preference. Use up to four times daily as directed. (FOR ICD-10 E10.9, E11.9). 02/08/21   Waddell Rake, MD  Calcium  Carbonate (CALCIUM  600 PO) Take 1 tablet by mouth daily in the afternoon.    [provider]  cholecalciferol (VITAMIN D3) 25 MCG (1000 UNIT) tablet Take 1,000 Units by mouth daily.    [provider]  Continuous Glucose Sensor (FREESTYLE LIBRE 3 PLUS SENSOR) MISC Inject 1 each into the skin every 14 (fourteen) days. Change sensor every 15 days. 06/22/24   Wendolyn Jenkins Jansky, MD  diltiazem  (CARTIA  XT) 240 MG 24 hr capsule Take 1 capsule by mouth once daily 04/28/24   Raford Riggs, MD  EMBECTA PEN NEEDLE ULTRAFINE 31G X 5 MM MISC USE 1 PEN NEEDLE ONCE DAILY 09/25/24   Wendolyn Jenkins Jansky, MD  empagliflozin  (JARDIANCE ) 10 MG TABS tablet Take 1 tablet (10 mg total) by mouth daily before breakfast. 12/11/24   Wendolyn Jenkins Jansky, MD  fenofibrate  160 MG tablet Take 1 tablet (160 mg total) by mouth daily. 12/11/24   Wendolyn Jenkins Jansky, MD  fluticasone  (FLONASE ) 50 MCG/ACT nasal spray Place into both nostrils daily.    [provider]  furosemide  (LASIX ) 20 MG tablet Take 1 tablet (20 mg total) by mouth 2 (  two) times daily. Patient taking differently: Take 20 mg by mouth as needed. 06/01/24   Wendolyn Jenkins Jansky, MD  glucose blood test strip Use as instructed 03/24/21   Waddell Rake, MD  ipratropium (ATROVENT ) 0.06 % nasal spray Place 2 sprays into both nostrils every 6 (six) hours as needed for rhinitis. 11/29/23   Wendolyn Jenkins Jansky, MD  LANTUS  SOLOSTAR 100 UNIT/ML Solostar Pen Inject 40 Units into the skin daily. 06/01/24   Wendolyn Jenkins Jansky, MD  levothyroxine  (SYNTHROID ) 50 MCG tablet Take 1 tablet (50 mcg total) by mouth daily before breakfast. TAKE 1 TABLET BY MOUTH ONCE DAILY FOR THYROID  12/13/24   Wendolyn Jenkins Jansky, MD  magnesium oxide (MAG-OX) 400  MG tablet Take 400 mg by mouth 2 (two) times daily.    [provider]  metFORMIN  (GLUCOPHAGE ) 1000 MG tablet TAKE 1 TABLET BY MOUTH TWICE DAILY WITH A MEAL 11/18/24   Wendolyn Jenkins Jansky, MD  metoprolol  succinate (TOPROL  XL) 100 MG 24 hr tablet Take 1 tablet (100 mg total) by mouth daily. Take with or immediately following a meal. 10/26/24   Walker, Caitlin S, NP  Multiple Vitamin (MULTIVITAMIN WITH MINERALS) TABS tablet Take 1 tablet by mouth daily.    [provider]  nystatin cream (MYCOSTATIN) Apply topically. 05/17/22   [provider]  Omega 3 1000 MG CAPS Take 1,000 mg by mouth daily.    [provider]  omeprazole  (PRILOSEC) 20 MG capsule Take 1 capsule (20 mg total) by mouth 2 (two) times daily before a meal. 12/11/24   Wendolyn Jenkins Jansky, MD  Polyethylene Glycol 3350 (MIRALAX PO) Take by mouth every evening.    [provider]  potassium chloride  (KLOR-CON  M) 10 MEQ tablet Take 1 tablet (10 mEq total) by mouth 2 (two) times daily. Patient taking differently: Take 10 mEq by mouth as needed (with furosemide ). 06/01/24   Wendolyn Jenkins Jansky, MD  promethazine -dextromethorphan  (PROMETHAZINE -DM) 6.25-15 MG/5ML syrup Take 5 mLs by mouth at bedtime as needed for cough. 12/11/24   Wendolyn Jenkins Jansky, MD  rosuvastatin  (CRESTOR ) 10 MG tablet Take 1 tablet (10 mg total) by mouth daily. 09/09/24   Wendolyn Jenkins Jansky, MD  sertraline  (ZOLOFT ) 50 MG tablet Take 1 tablet (50 mg total) by mouth daily. 07/28/24   Cottle, Lorene KANDICE Raddle., MD  SYMBICORT  160-4.5 MCG/ACT inhaler Inhale 2 puffs into the lungs in the morning and at bedtime. 12/11/24   Wendolyn Jenkins Jansky, MD  tirzepatide  (MOUNJARO ) 5 MG/0.5ML Pen Inject 5 mg into the skin once a week. 12/11/24   Wendolyn Jenkins Jansky, MD  traMADol  (ULTRAM ) 50 MG tablet Take 1 tablet by mouth every 6 (six) hours as needed. 10/22/16   [provider]  triamcinolone cream (KENALOG) 0.1 % Apply topically 3 (three) times daily. 05/17/22   [provider]  valsartan  (DIOVAN ) 80 MG tablet Take 1 tablet by mouth once daily 10/16/24   Walker, Caitlin S, NP  VITAMIN E PO Take by mouth.    [provider]    Allergies: Celecoxib, Glipizide, Penicillins, and Sulfa antibiotics    Review of Systems  Unable to perform ROS: Acuity of condition  Constitutional:  Positive for fever.  Respiratory:  Positive for cough.     Updated Vital Signs BP (!) 153/66   Pulse (!) 103   Temp (!) 100.7 F (38.2 C) (Oral)   Resp (!) 29   LMP  (LMP Unknown)   SpO2 93%   Physical Exam Vitals and  nursing note reviewed.  Constitutional:      General: She is not in acute distress.    Appearance: Normal appearance. She is well-developed.  HENT:     Head: Normocephalic and atraumatic.     Nose: Nose normal.  Eyes:     Pupils: Pupils are equal, round, and reactive to light.  Cardiovascular:     Rate and Rhythm: Regular rhythm. Tachycardia present.     Pulses: Normal pulses.     Heart sounds: Normal heart sounds.  Pulmonary:     Effort: No respiratory distress.     Breath sounds: Rhonchi present.  Abdominal:     General: Bowel sounds are normal. There is no distension.     Palpations: Abdomen is soft.     Tenderness: There is no abdominal tenderness. There is no guarding or rebound.  Musculoskeletal:        General: Normal range of motion.     Cervical back: Normal range of motion and neck supple.  Skin:    General: Skin is warm and dry.     Capillary Refill: Capillary refill takes less than 2 seconds.     Findings: No erythema or rash.  Neurological:     General: No focal deficit present.     Mental Status: She is alert and oriented to person, place, and time.     Deep Tendon Reflexes: Reflexes normal.     (all labs ordered are listed, but only abnormal results are displayed) Results for orders placed or performed during the hospital encounter of 12/18/24  Resp panel by RT-PCR (RSV, Flu A&B, Covid) Anterior Nasal Swab    Collection Time: 12/18/24 12:36 AM   Specimen: Anterior Nasal Swab  Result Value Ref Range   SARS Coronavirus 2 by RT PCR POSITIVE (A) NEGATIVE   Influenza A by PCR NEGATIVE NEGATIVE   Influenza B by PCR NEGATIVE NEGATIVE   Resp Syncytial Virus by PCR NEGATIVE NEGATIVE  Blood Culture (routine x 2)   Collection Time: 12/19/24 12:08 AM   Specimen: BLOOD RIGHT FOREARM  Result Value Ref Range   Specimen Description      BLOOD RIGHT FOREARM Performed at [facility], [address] [place], [place] [address]    Special Requests      BOTTLES DRAWN AEROBIC AND ANAEROBIC Blood Culture adequate volume Performed at [facility], [address], [place], [place] [address]    Culture PENDING    Report Status PENDING   Comprehensive metabolic panel   Collection Time: 12/19/24 12:10 AM  Result Value Ref Range   Sodium 139 135 - 145 mmol/L   Potassium 3.8 3.5 - 5.1 mmol/L   Chloride 106 98 - 111 mmol/L   CO2 20 (L) 22 - 32 mmol/L   Glucose, Bld 198 (H) 70 - 99 mg/dL   BUN 16 8 - 23 mg/dL   Creatinine, Ser 9.18 0.44 - 1.00 mg/dL   Calcium  9.2 8.9 - 10.3 mg/dL   Total Protein 6.9 6.5 - 8.1 g/dL   Albumin 3.8 3.5 - 5.0 g/dL   AST 25 15 - 41 U/L   ALT 20 0 - 44 U/L   Alkaline Phosphatase 83 38 - 126 U/L   Total Bilirubin 0.4 0.0 - 1.2 mg/dL   GFR, Estimated >39 >39 mL/min   Anion gap 13 5 - 15  CBC with Differential   Collection Time: 12/19/24 12:10 AM  Result Value Ref Range   WBC 8.9 4.0 - 10.5  K/uL   RBC 4.97 3.87 - 5.11 MIL/uL   Hemoglobin 13.2 12.0 - 15.0 g/dL   HCT 58.8 63.9 - 53.9 %   MCV 82.7 80.0 - 100.0 fL   MCH 26.6 26.0 - 34.0 pg   MCHC 32.1 30.0 - 36.0 g/dL   RDW 85.4 88.4 - 84.4 %   Platelets 219 150 - 400 K/uL   nRBC 0.0 0.0 - 0.2 %   Neutrophils Relative % 84 %   Neutro Abs 7.6 1.7 - 7.7 K/uL   Lymphocytes Relative 5 %   Lymphs Abs 0.4 (L) 0.7 - 4.0 K/uL   Monocytes Relative 8 %   Monocytes Absolute 0.7 0.1 - 1.0 K/uL   Eosinophils  Relative 1 %   Eosinophils Absolute 0.1 0.0 - 0.5 K/uL   Basophils Relative 1 %   Basophils Absolute 0.1 0.0 - 0.1 K/uL   Immature Granulocytes 1 %   Abs Immature Granulocytes 0.05 0.00 - 0.07 K/uL  Protime-INR   Collection Time: 12/19/24 12:10 AM  Result Value Ref Range   Prothrombin Time 14.8 11.4 - 15.2 seconds   INR 1.1 0.8 - 1.2  Blood Culture (routine x 2)   Collection Time: 12/19/24 12:11 AM   Specimen: BLOOD LEFT HAND  Result Value Ref Range   Specimen Description      BLOOD LEFT HAND Performed at [facility], [address], [place], [place] [address]    Special Requests      BOTTLES DRAWN AEROBIC AND ANAEROBIC Blood Culture adequate volume Performed at [facility], [address], [place], [place] [address]    Culture PENDING    Report Status PENDING   I-Stat Lactic Acid, ED   Collection Time: 12/19/24 12:19 AM  Result Value Ref Range   Lactic Acid, Venous 2.1 (HH) 0.5 - 1.9 mmol/L   Comment NOTIFIED PHYSICIAN   I-Stat Lactic Acid, ED   Collection Time: 12/19/24  2:19 AM  Result Value Ref Range   Lactic Acid, Venous 1.7 0.5 - 1.9 mmol/L   DG Chest Port 1 View if patient is in a treatment room. Result Date: 12/19/2024 EXAM: 1 VIEW(S) XRAY OF THE CHEST 12/19/2024 12:18:00 AM COMPARISON: 06/08/2024 CLINICAL HISTORY: Suspected sepsis. FINDINGS: LUNGS AND PLEURA: Low lung volumes. Stable diffuse interstitial coarsening. No focal pulmonary opacity. No pleural effusion. No pneumothorax. HEART AND MEDIASTINUM: Aortic atherosclerotic calcification. BONES AND SOFT TISSUES: No acute osseous abnormality. IMPRESSION: 1. No acute cardiopulmonary abnormality. 2. Stable diffuse interstitial coarsening. Electronically signed by: Oneil Devonshire MD 12/19/2024 12:25 AM EST RP Workstation: [id number]    EKG: EKG Interpretation Date/Time:  Saturday December 19 2024 00:46:59 EST Ventricular Rate:  112 PR Interval:  188 QRS Duration:  119 QT Interval:  381 QTC  Calculation: 521 R Axis:   47  Text Interpretation: Sinus tachycardia Incomplete right bundle branch block Confirmed by Nettie, Conception Doebler ([id number]) on 12/19/2024 12:49:35 AM  Radiology: ARCOLA Chest Port 1 View if patient is in a treatment room. Result Date: 12/19/2024 EXAM: 1 VIEW(S) XRAY OF THE CHEST 12/19/2024 12:18:00 AM COMPARISON: 06/08/2024 CLINICAL HISTORY: Suspected sepsis. FINDINGS: LUNGS AND PLEURA: Low lung volumes. Stable diffuse interstitial coarsening. No focal pulmonary opacity. No pleural effusion. No pneumothorax. HEART AND MEDIASTINUM: Aortic atherosclerotic calcification. BONES AND SOFT TISSUES: No acute osseous abnormality. IMPRESSION: 1. No acute cardiopulmonary abnormality. 2. Stable diffuse interstitial coarsening. Electronically signed by: Oneil Devonshire MD 12/19/2024 12:25 AM EST RP Workstation: [id number]     .Critical Care  Performed  byBETHA Nettie Earing, MD Authorized by: Nettie Earing, MD   Critical care provider statement:    Critical care time (minutes):  60   Critical care end time:  12/19/2024 5:41 AM   Critical care was necessary to treat or prevent imminent or life-threatening deterioration of the following conditions:  Sepsis   Critical care was time spent personally by me on the following activities:  Development of treatment plan with patient or surrogate, discussions with consultants, evaluation of patient's response to treatment, examination of patient, ordering and review of laboratory studies, ordering and review of radiographic studies, ordering and performing treatments and interventions, pulse oximetry, re-evaluation of patient's condition and review of old charts   I assumed direction of critical care for this patient from another provider in my specialty: no     Care discussed with: admitting provider      Medications Ordered in the ED  lactated ringers  infusion ( Intravenous New Bag/Given 12/19/24 0048)  vancomycin  (VANCOCIN ) IVPB 1000 mg/200 mL premix  (1,000 mg Intravenous New Bag/Given 12/19/24 0242)  aztreonam  (AZACTAM ) 2 g in sodium chloride  0.9 % 100 mL IVPB (0 g Intravenous Stopped 12/19/24 0119)  metroNIDAZOLE  (FLAGYL ) IVPB 500 mg (0 mg Intravenous Stopped 12/19/24 0229)  lactated ringers  bolus 1,000 mL (1,000 mLs Intravenous New Bag/Given 12/19/24 0226)  acetaminophen  (TYLENOL ) tablet 1,000 mg (1,000 mg Oral Given 12/19/24 0210)                                    Medical Decision Making Sepsis with cough and fever and global weakness   Amount and/or Complexity of Data Reviewed Independent Historian: EMS    Details: See above  External Data Reviewed: notes.    Details: Previous notes reviewed  Labs: ordered.    Details: Covid is positive flu is negative, normal sodium 139, normal potassium 3,8, normal creatinine 0.81.  normal LFTs, first lactate elevated 2.1 normal white count 8.9 normal hemoglobin 13.2 normal platelets  Radiology: ordered and independent interpretation performed.    Details: No CHF by me  ECG/medicine tests: ordered and independent interpretation performed. Decision-making details documented in ED Course.  Risk OTC drugs. Prescription drug management. Decision regarding hospitalization.     Final diagnoses:  Sepsis, due to unspecified organism, unspecified whether acute organ dysfunction present [facility])  COVID-19   The patient appears reasonably stabilized for admission considering the current resources, flow, and capabilities available in the ED at this time, and I doubt any other [facility] requiring further screening and/or treatment in the ED prior to admission.  ED Discharge Orders     None          Naomia Lenderman, MD 12/19/24 ARTEMUS Nettie, Nakeya Adinolfi, MD 12/19/24 [id number]  "

## 2024-12-19 NOTE — ED Notes (Signed)
 Patient placed on 2 L [place] due to decreasing oxygen saturations

## 2024-12-20 DIAGNOSIS — U071 COVID-19: Secondary | ICD-10-CM

## 2024-12-20 LAB — MAGNESIUM: Magnesium: 1.9 mg/dL (ref 1.7–2.4)

## 2024-12-20 LAB — GLUCOSE, CAPILLARY
Glucose-Capillary: 89 mg/dL (ref 70–99)
Glucose-Capillary: 106 mg/dL — ABNORMAL HIGH (ref 70–99)
Glucose-Capillary: 108 mg/dL — ABNORMAL HIGH (ref 70–99)
Glucose-Capillary: 163 mg/dL — ABNORMAL HIGH (ref 70–99)

## 2024-12-20 LAB — CBC WITH DIFFERENTIAL/PLATELET
Abs Immature Granulocytes: 0.02 10*3/uL (ref 0.00–0.07)
Basophils Absolute: 0 10*3/uL (ref 0.0–0.1)
Basophils Relative: 1 %
Eosinophils Absolute: 0 10*3/uL (ref 0.0–0.5)
Eosinophils Relative: 0 %
HCT: 41.1 % (ref 36.0–46.0)
Hemoglobin: 12.5 g/dL (ref 12.0–15.0)
Immature Granulocytes: 1 %
Lymphocytes Relative: 41 %
Lymphs Abs: 1.6 10*3/uL (ref 0.7–4.0)
MCH: 26 pg (ref 26.0–34.0)
MCHC: 30.4 g/dL (ref 30.0–36.0)
MCV: 85.4 fL (ref 80.0–100.0)
Monocytes Absolute: 0.4 10*3/uL (ref 0.1–1.0)
Monocytes Relative: 11 %
Neutro Abs: 1.9 10*3/uL (ref 1.7–7.7)
Neutrophils Relative %: 46 %
Platelets: 233 10*3/uL (ref 150–400)
RBC: 4.81 MIL/uL (ref 3.87–5.11)
RDW: 14.9 % (ref 11.5–15.5)
WBC: 4 10*3/uL (ref 4.0–10.5)
nRBC: 0 % (ref 0.0–0.2)

## 2024-12-20 LAB — BASIC METABOLIC PANEL WITH GFR
Anion gap: 8 (ref 5–15)
BUN: 9 mg/dL (ref 8–23)
CO2: 28 mmol/L (ref 22–32)
Calcium: 9.2 mg/dL (ref 8.9–10.3)
Chloride: 107 mmol/L (ref 98–111)
Creatinine, Ser: 0.79 mg/dL (ref 0.44–1.00)
GFR, Estimated: >60 mL/min
Glucose, Bld: 84 mg/dL (ref 70–99)
Potassium: 3.8 mmol/L (ref 3.5–5.1)
Sodium: 143 mmol/L (ref 135–145)

## 2024-12-20 LAB — C-REACTIVE PROTEIN: CRP: 4.3 mg/dL — ABNORMAL HIGH

## 2024-12-20 LAB — FERRITIN: Ferritin: 444 ng/mL — ABNORMAL HIGH (ref 11–307)

## 2024-12-20 NOTE — TOC Initial Note (Signed)
 Transition of Care [facility]) - Initial/Assessment Note    Patient Details  Name: Teresa Rice MRN: [id number] Date of Birth: [birth year]  Transition of Care [facility]) CM/SW Contact:    Sonda Manuella Quill, RN Phone Number: 12/20/2024, 11:59 AM  Clinical Narrative:                 Spoke w/ pt over phone; pt said she lives at home; she plans to d/c to dtr's house then back to her apt; pt said she will try to arrange transportation; she identified POC dtr Dawn Ward [phone]); insurance/PCP verified; pt denied SDOH risks; she does not have DME, HH services, or home oxygen; awaiting no recc per PT/OT evals; IP CM is following.  Expected Discharge Plan: Home/Self Care Barriers to Discharge: Continued Medical Work up   Patient Goals and CMS Choice Patient states their goals for this hospitalization and ongoing recovery are:: home     [place] ownership interest in [facility].provided to:: Patient    Expected Discharge Plan and Services   Discharge Planning Services: CM Consult   Living arrangements for the past 2 months: Apartment                 DME Arranged: N/A DME Agency: NA       HH Arranged: NA HH Agency: NA        Prior Living Arrangements/Services Living arrangements for the past 2 months: Apartment Lives with:: Self Patient language and need for interpreter reviewed:: Yes Do you feel safe going back to the place where you live?: Yes      Need for Family Participation in Patient Care: Yes (Comment) Care giver support system in place?: Yes (comment) Current home services:  (n/a) Criminal Activity/Legal Involvement Pertinent to Current Situation/Hospitalization: No - Comment as needed  Activities of Daily Living   ADL Screening (condition at time of admission) Independently performs ADLs?: Yes (appropriate for developmental age) Is the patient deaf or have difficulty hearing?: Yes Does the patient have difficulty seeing, even when wearing  glasses/contacts?: No Does the patient have difficulty concentrating, remembering, or making decisions?: No  Permission Sought/Granted Permission sought to share information with : Case Manager Permission granted to share information with : Yes, Verbal Permission Granted  Share Information with NAME: Case Manager     Permission granted to share info w Relationship: Dawn Ward (dtr) [phone]     Emotional Assessment Appearance:: Other (Comment Required (unable to assess) Attitude/Demeanor/Rapport: Gracious Affect (typically observed): Accepting Orientation: : Oriented to Self, Oriented to Place, Oriented to  Time, Oriented to Situation Alcohol / Substance Use: Not Applicable Psych Involvement: No (comment)  Admission diagnosis:  Sepsis, due to unspecified organism, unspecified whether acute organ dysfunction present (HCC) [A41.9] COVID-19 virus infection [U07.1] COVID-19 [U07.1] Patient Active Problem List   Diagnosis Date Noted   COVID-19 virus infection 12/19/2024   Localized edema 02/14/2023   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   Palpitations 09/04/2022   Major depressive disorder, recurrent episode, moderate (HCC) 02/06/2022   Screening for colorectal cancer patient refuses 02/06/2022   Osteopenia 05/05/2020   History of bladder cancer 10/14/2019   Morton's neuroma of both feet 08/10/2019   Seasonal allergies 05/06/2019   Gastroesophageal reflux disease 05/06/2019   Moderate persistent asthma 05/06/2019   Acquired hypothyroidism 05/06/2019   Chronic bilateral low back pain without sciatica 05/06/2019   Type 2 diabetes mellitus with insulin  therapy (HCC) 05/06/2019   Hyperlipidemia associated with type 2 diabetes mellitus (HCC) 05/06/2019  Hypertension associated with diabetes (HCC) 05/06/2019   PTSD (post-traumatic stress disorder) 11/10/2018   Bilateral carpal tunnel syndrome 10/11/2015   Primary osteoarthritis of both hands 10/11/2015   Trigger finger of  both hands 10/11/2015   PCP:  Wendolyn Jenkins Jansky, MD Pharmacy:   [facility] [place] [address] [place] - [address] [place] [address] Phone: [phone] Fax: [phone]  [facility]) - [place], [place] - [address] [facility] [address] [place] [address] [place] [address] Phone: [phone] Fax: [phone]  [id number] [place], [place] - [address] AT [facility] & [address] [place] [address] Phone: [phone] Fax: [phone]     Social Drivers of Health (SDOH) Social History: SDOH Screenings   Food Insecurity: No Food Insecurity (12/20/2024)  Housing: Low Risk (12/20/2024)  Transportation Needs: No Transportation Needs (12/20/2024)  Utilities: Not At Risk (12/20/2024)  Depression (PHQ2-9): Low Risk (12/11/2024)  Financial Resource Strain: Low Risk (11/19/2023)  Physical Activity: Inactive (11/19/2023)  Social Connections: Moderately Isolated (12/19/2024)  Stress: No Stress Concern Present (11/19/2023)  Tobacco Use: Medium Risk (12/19/2024)  Health Literacy: Adequate Health Literacy (11/19/2023)   SDOH Interventions: Food Insecurity Interventions: Intervention Not Indicated, Inpatient TOC Housing Interventions: Intervention Not Indicated, Inpatient TOC Transportation Interventions: Intervention Not Indicated, Inpatient TOC Utilities Interventions: Intervention Not Indicated, Inpatient TOC   Readmission Risk Interventions     No data to display

## 2024-12-20 NOTE — Progress Notes (Signed)
 " PROGRESS NOTE    Teresa Rice  FMW:[id number] DOB: Jan 23, 1946 DOA: 12/18/2024 PCP: Wendolyn Jenkins Jansky, MD   Brief Narrative: Patient was admitted with generalized weakness.  Low-grade fever chills and a cough status post fall at home without injury diagnosed with COVID-19 with really no respiratory symptoms on room air, found to be dehydrated and admitted for supportive management.  Patient lives alone but was exposed to her great granddaughter who had fever and was sick. She is extremely hard of hearing and she does not have her hearing aids here in the hospital. She she has type 2 diabetes on insulin  hypertension hyperlipidemia GERD osteoarthritis mild asthma and sinus tachycardia.   Assessment & Plan:   Principal Problem:   COVID-19 virus infection  #1 COVID-19, patient admitted with generalized weakness fever chills and fall prior to admission.  She is being treated with IV fluids.  CRP pending from this morning.  Since she is on room air with barely no respiratory symptoms she has not been started on remdesivir or steroids.  She continues to be on room air and is able to keep up her oxygen saturation.  She is tachypneic intermittently. Chest x-ray on admission showed no acute infiltrates stable chronic changes diffuse interstitial coarsening.  Left heart border not clearly visualized.  Chest x-ray reviewed by me. In the range incentive spirometer and flutter valve  #2 status post fall at home due to generalized weakness and poor p.o. intake due to COVID PT eval noted with no new recommendations.  #3 type 2 diabetes continue Lantus  and SSI CBG (last 3)  Recent Labs    12/19/24 1636 12/19/24 2141 12/20/24 0810  GLUCAP 117* 134* 89   #4 hypothyroidism continue Synthroid  TSH 0.806  #5 history of sinus tachycardia currently stable and rate controlled on Cardizem  and Toprol . TSH 0.8 #6 history of anxiety continue home meds benzos  #7 history of mild asthma as needed oxygen and  nebulizer although currently now on room air   #8 hyperlipidemia on Crestor   #9 depression on Zoloft   Estimated body mass index is 28.49 kg/m as calculated from the following:   Height as of this encounter: 5' 4 (1.626 m).   Weight as of this encounter: 75.3 kg.  DVT prophylaxis: Lovenox Code Status: DNR  family Communication: None at bedside Disposition Plan:  Status is: Observation The patient remains OBS appropriate and will d/c before 2 midnights.   Consultants: None  Procedures: None none Antimicrobials: None  Subjective:  Patient resting in bed on room air denies any shortness of breath or cough extremely hard of hearing does not have her hearing aid no overnight events reported by the nursing staff Objective: Vitals:   12/19/24 1502 12/19/24 1820 12/19/24 1930 12/20/24 0548  BP: (!) 101/45 138/63 (!) 119/59 137/70  Pulse: 93  77 78  Resp: 17     Temp: 99.3 F (37.4 C)  97.8 F (36.6 C) 98 F (36.7 C)  TempSrc:   Oral Oral  SpO2: 93%  96% 92%  Weight:      Height:        Intake/Output Summary (Last 24 hours) at 12/20/2024 0802 Last data filed at 12/20/2024 0300 Gross per 24 hour  Intake 683.59 ml  Output --  Net 683.59 ml   Filed Weights   12/19/24 1500  Weight: 75.3 kg    Examination:  General exam: Appears in no acute distress Respiratory system: Clear to auscultation. Respiratory effort normal. Cardiovascular system: Regular not  tachycardic Gastrointestinal system: Abdomen is nondistended, soft and nontender. No organomegaly or masses felt. Normal bowel sounds heard. Central nervous system: Alert and oriented. No focal neurological deficits. Extremities: No edema   Data Reviewed: I have personally reviewed following labs and imaging studies  CBC: Recent Labs  Lab 12/19/24 0010 12/19/24 0902 12/20/24 0603  WBC 8.9 6.3 4.0  NEUTROABS 7.6  --  1.9  HGB 13.2 12.2 12.5  HCT 41.1 37.4 41.1  MCV 82.7 81.7 85.4  PLT 219 239 233   Basic  Metabolic Panel: Recent Labs  Lab 12/19/24 0010 12/19/24 0821 12/20/24 0603  NA 139  --  143  K 3.8  --  3.8  CL 106  --  107  CO2 20*  --  28  GLUCOSE 198*  --  84  BUN 16  --  9  CREATININE 0.81 0.78 0.79  CALCIUM  9.2  --  9.2  MG  --   --  1.9   GFR: Estimated Creatinine Clearance: 56.6 mL/min (by C-G formula based on SCr of 0.79 mg/dL). Liver Function Tests: Recent Labs  Lab 12/19/24 0010  AST 25  ALT 20  ALKPHOS 83  BILITOT 0.4  PROT 6.9  ALBUMIN 3.8   No results for input(s): LIPASE, AMYLASE in the last 168 hours. No results for input(s): AMMONIA in the last 168 hours. Coagulation Profile: Recent Labs  Lab 12/19/24 0010  INR 1.1   Cardiac Enzymes: No results for input(s): CKTOTAL, CKMB, CKMBINDEX, TROPONINI in the last 168 hours. BNP (last 3 results) No results for input(s): PROBNP in the last 8760 hours. HbA1C: No results for input(s): HGBA1C in the last 72 hours. CBG: Recent Labs  Lab 12/19/24 0824 12/19/24 1227 12/19/24 1636 12/19/24 2141  GLUCAP 108* 147* 117* 134*   Lipid Profile: No results for input(s): CHOL, HDL, LDLCALC, TRIG, CHOLHDL, LDLDIRECT in the last 72 hours. Thyroid  Function Tests: Recent Labs    12/19/24 0821  TSH 0.806  FREET4 1.06   Anemia Panel: No results for input(s): VITAMINB12, FOLATE, FERRITIN, TIBC, IRON, RETICCTPCT in the last 72 hours. Sepsis Labs: Recent Labs  Lab 12/19/24 0019 12/19/24 0219 12/19/24 0821  PROCALCITON  --   --  0.16  LATICACIDVEN 2.1* 1.7  --     Recent Results (from the past 240 hours)  Resp panel by RT-PCR (RSV, Flu A&B, Covid) Anterior Nasal Swab     Status: Abnormal   Collection Time: 12/18/24 12:36 AM   Specimen: Anterior Nasal Swab  Result Value Ref Range Status   SARS Coronavirus 2 by RT PCR POSITIVE (A) NEGATIVE Final    Comment: (NOTE) SARS-CoV-2 target nucleic acids are DETECTED.  The SARS-CoV-2 RNA is generally detectable in upper  respiratory specimens during the acute phase of infection. Positive results are indicative of the presence of the identified virus, but do not rule out bacterial infection or co-infection with other pathogens not detected by the test. Clinical correlation with patient history and other diagnostic information is necessary to determine patient infection status. The expected result is Negative.  Fact Sheet for Patients: bloggercourse.com  Fact Sheet for Healthcare Providers: seriousbroker.it  This test is not yet approved or cleared by the [place]  FDA and  has been authorized for detection and/or diagnosis of SARS-CoV-2 by FDA under an Emergency Use Authorization (EUA).  This EUA will remain in effect (meaning this test can be used) for the duration of  the COVID-19 declaration under Section 564(b)(1) of the A ct, 21  U.S.C. section 360bbb-3(b)(1), unless the authorization is terminated or revoked sooner.     Influenza A by PCR NEGATIVE NEGATIVE Final   Influenza B by PCR NEGATIVE NEGATIVE Final    Comment: (NOTE) The Xpert Xpress SARS-CoV-2/FLU/RSV plus assay is intended as an aid in the diagnosis of influenza from Nasopharyngeal swab specimens and should not be used as a sole basis for treatment. Nasal washings and aspirates are unacceptable for Xpert Xpress SARS-CoV-2/FLU/RSV testing.  Fact Sheet for Patients: bloggercourse.com  Fact Sheet for Healthcare Providers: seriousbroker.it  This test is not yet approved or cleared by the [place]  FDA and has been authorized for detection and/or diagnosis of SARS-CoV-2 by FDA under an Emergency Use Authorization (EUA). This EUA will remain in effect (meaning this test can be used) for the duration of the COVID-19 declaration under Section 564(b)(1) of the Act, 21 U.S.C. section 360bbb-3(b)(1), unless the authorization is  terminated or revoked.     Resp Syncytial Virus by PCR NEGATIVE NEGATIVE Final    Comment: (NOTE) Fact Sheet for Patients: bloggercourse.com  Fact Sheet for Healthcare Providers: seriousbroker.it  This test is not yet approved or cleared by the [place]  FDA and has been authorized for detection and/or diagnosis of SARS-CoV-2 by FDA under an Emergency Use Authorization (EUA). This EUA will remain in effect (meaning this test can be used) for the duration of the COVID-19 declaration under Section 564(b)(1) of the Act, 21 U.S.C. section 360bbb-3(b)(1), unless the authorization is terminated or revoked.  Performed at [facility], [address], [place], [place] [address]   Blood Culture (routine x 2)     Status: None (Preliminary result)   Collection Time: 12/19/24 12:08 AM   Specimen: BLOOD RIGHT FOREARM  Result Value Ref Range Status   Specimen Description   Final    BLOOD RIGHT FOREARM Performed at [facility], [address] [place], [place] [address]    Special Requests   Final    BOTTLES DRAWN AEROBIC AND ANAEROBIC Blood Culture adequate volume Performed at [facility], [address], [place], [place] [address]    Culture   Final    NO GROWTH < 12 HOURS Performed at [facility], [address], [place], [place] [address]    Report Status PENDING  Incomplete  Blood Culture (routine x 2)     Status: None (Preliminary result)   Collection Time: 12/19/24 12:11 AM   Specimen: BLOOD LEFT HAND  Result Value Ref Range Status   Specimen Description   Final    BLOOD LEFT HAND Performed at [facility], [address] [place], [place] [address]    Special Requests   Final    BOTTLES DRAWN AEROBIC AND ANAEROBIC Blood Culture adequate volume Performed at [facility], [address] [place], [place] [address]    Culture   Final    NO  GROWTH < 12 HOURS Performed at [facility], [address] [place], [place] [address]    Report Status PENDING  Incomplete     Radiology Studies: DG Chest Port 1 View if patient is in a treatment room. Result Date: 12/19/2024 EXAM: 1 VIEW(S) XRAY OF THE CHEST 12/19/2024 12:18:00 AM COMPARISON: 06/08/2024 CLINICAL HISTORY: Suspected sepsis. FINDINGS: LUNGS AND PLEURA: Low lung volumes. Stable diffuse interstitial coarsening. No focal pulmonary opacity. No pleural effusion. No pneumothorax. HEART AND MEDIASTINUM: Aortic atherosclerotic calcification. BONES AND SOFT TISSUES: No acute osseous abnormality.  IMPRESSION: 1. No acute cardiopulmonary abnormality. 2. Stable diffuse interstitial coarsening. Electronically signed by: Oneil Devonshire MD 12/19/2024 12:25 AM EST RP Workstation: [id number]    Scheduled Meds:  baclofen   5 mg Oral BID   budesonide -formoterol   2 puff Inhalation BID   diltiazem   240 mg Oral q1800   fenofibrate   160 mg Oral Daily   heparin   5,000 Units Subcutaneous Q8H   insulin  aspart  0-15 Units Subcutaneous TID WC   insulin  aspart  0-5 Units Subcutaneous QHS   insulin  glargine-yfgn  20 Units Subcutaneous BID   levothyroxine   50 mcg Oral Q0600   metoprolol  succinate  50 mg Oral Daily   pantoprazole   40 mg Oral Daily   rosuvastatin   10 mg Oral Daily   sertraline   50 mg Oral Daily   Continuous Infusions:   LOS: 0 days     Teresa KANDICE Hoots, MD  12/20/2024, 8:02 AM   "

## 2024-12-20 NOTE — Care Management Obs Status (Signed)
 MEDICARE OBSERVATION STATUS NOTIFICATION   Patient Details  Name: AHNYLA MENDEL MRN: [id number] Date of Birth: 02/06/1946   Medicare Observation Status Notification Given:  Yes    Sonda Manuella Quill, RN 12/20/2024, 11:51 AM

## 2024-12-21 ENCOUNTER — Other Ambulatory Visit (HOSPITAL_COMMUNITY): Payer: Self-pay

## 2024-12-21 DIAGNOSIS — U071 COVID-19: Secondary | ICD-10-CM | POA: Diagnosis not present

## 2024-12-21 LAB — GLUCOSE, CAPILLARY: Glucose-Capillary: 96 mg/dL (ref 70–99)

## 2024-12-21 LAB — CBC
HCT: 42.2 % (ref 36.0–46.0)
Hemoglobin: 13.2 g/dL (ref 12.0–15.0)
MCH: 26.1 pg (ref 26.0–34.0)
MCHC: 31.3 g/dL (ref 30.0–36.0)
MCV: 83.6 fL (ref 80.0–100.0)
Platelets: 228 10*3/uL (ref 150–400)
RBC: 5.05 MIL/uL (ref 3.87–5.11)
RDW: 14.6 % (ref 11.5–15.5)
WBC: 3.7 10*3/uL — ABNORMAL LOW (ref 4.0–10.5)
nRBC: 0 % (ref 0.0–0.2)

## 2024-12-21 LAB — COMPREHENSIVE METABOLIC PANEL WITH GFR
ALT: 20 U/L (ref 0–44)
AST: 27 U/L (ref 15–41)
Albumin: 3.7 g/dL (ref 3.5–5.0)
Alkaline Phosphatase: 68 U/L (ref 38–126)
Anion gap: 9 (ref 5–15)
BUN: 12 mg/dL (ref 8–23)
CO2: 28 mmol/L (ref 22–32)
Calcium: 9.6 mg/dL (ref 8.9–10.3)
Chloride: 106 mmol/L (ref 98–111)
Creatinine, Ser: 0.98 mg/dL (ref 0.44–1.00)
GFR, Estimated: 59 mL/min — ABNORMAL LOW
Glucose, Bld: 88 mg/dL (ref 70–99)
Potassium: 4 mmol/L (ref 3.5–5.1)
Sodium: 143 mmol/L (ref 135–145)
Total Bilirubin: 0.2 mg/dL (ref 0.0–1.2)
Total Protein: 7 g/dL (ref 6.5–8.1)

## 2024-12-21 LAB — FERRITIN: Ferritin: 541 ng/mL — ABNORMAL HIGH (ref 11–307)

## 2024-12-21 LAB — C-REACTIVE PROTEIN: CRP: 2.2 mg/dL — ABNORMAL HIGH

## 2024-12-21 MED ORDER — METOPROLOL SUCCINATE ER 50 MG PO TB24
50.0000 mg | ORAL_TABLET | Freq: Every day | ORAL | 2 refills | Status: AC
  Filled 2024-12-21: qty 30, 30d supply, fill #0

## 2024-12-21 MED ORDER — ACETAMINOPHEN 325 MG PO TABS: 650.0000 mg | ORAL_TABLET | Freq: Four times a day (QID) | ORAL | Status: AC | PRN

## 2024-12-21 NOTE — TOC Transition Note (Signed)
 Transition of Care [facility]) - Discharge Note   Patient Details  Name: Teresa Rice MRN: [id number] Date of Birth: 1946/03/31  [facility] CM/SW Contact:  Doneta Glenys DASEN, RN Phone Number: 12/21/2024, 9:24 AM   Clinical Narrative:    CM spoke with patients daughter Stephane said she should be at the hospital about 10 am and will check in at the nurses station when she arrives.  IP CM signing off.   Final next level of care: Home/Self Care Barriers to Discharge: ED Barriers Resolved   Patient Goals and CMS Choice Patient states their goals for this hospitalization and ongoing recovery are:: Home CMS Medicare.gov Compare Post Acute Care list provided to:: Patient Choice offered to / list presented to : Patient [place] ownership interest in [facility].provided to:: Patient    Discharge Placement                       Discharge Plan and Services Additional resources added to the After Visit Summary for     Discharge Planning Services: CM Consult            DME Arranged: N/A DME Agency: NA       HH Arranged: NA HH Agency: NA        Social Drivers of Health (SDOH) Interventions SDOH Screenings   Food Insecurity: No Food Insecurity (12/20/2024)  Housing: Low Risk (12/20/2024)  Transportation Needs: No Transportation Needs (12/20/2024)  Utilities: Not At Risk (12/20/2024)  Depression (PHQ2-9): Low Risk (12/11/2024)  Financial Resource Strain: Low Risk (11/19/2023)  Physical Activity: Inactive (11/19/2023)  Social Connections: Moderately Isolated (12/19/2024)  Stress: No Stress Concern Present (11/19/2023)  Tobacco Use: Medium Risk (12/19/2024)  Health Literacy: Adequate Health Literacy (11/19/2023)     Readmission Risk Interventions     No data to display

## 2024-12-21 NOTE — Progress Notes (Signed)
 Discharge medications delivered to patient at the bedside in a secure bag.

## 2024-12-21 NOTE — Discharge Summary (Signed)
 Physician Discharge Summary  Teresa Rice FMW:[id number] DOB: June 29, 1946 DOA: 12/18/2024  PCP: Wendolyn Jenkins Jansky, MD  Admit date: 12/18/2024 Discharge date: 12/21/2024  Admitted From: Home Disposition: Home   Recommendations for Outpatient Follow-up:  Follow up with PCP in 1-2 weeks Please obtain BMP/CBC in one week   Home Health: None Equipment/Devices: None  Discharge Condition: Stable CODE STATUS: Full code Diet recommendation: Cardiac Brief/Interim Summary: You may copy/paste interim summary or write brief hospital course depending on length of stay  Discharge Diagnoses:  Principal Problem:   COVID-19 virus infection                      Estimated body mass index is 28.49 kg/m as calculated from the following:   Height as of this encounter: 5' 4 (1.626 m).   Weight as of this encounter: 75.3 kg.  Discharge Instructions  Discharge Instructions     Increase activity slowly   Complete by: As directed       Allergies as of 12/21/2024       Reactions   Celecoxib Anaphylaxis, Shortness Of Breath   12/19/24 pt agreeable to using ibuprofen  short-term for fever and it was tolerated.   Glipizide Itching   Penicillins Other (See Comments)   Burn marks from inside out   Sulfa Antibiotics Itching        Medication List     STOP taking these medications    Accu-Chek Softclix Lancets lancets   Embecta Pen Needle Ultrafine 31G X 5 MM Misc Generic drug: Insulin  Pen Needle   glucose blood test strip   nystatin cream Commonly known as: MYCOSTATIN   triamcinolone cream 0.1 % Commonly known as: KENALOG   valsartan  80 MG tablet Commonly known as: DIOVAN        TAKE these medications    acetaminophen  325 MG tablet Commonly known as: TYLENOL  Take 2 tablets (650 mg total) by mouth every 6 (six) hours as needed for mild pain (pain score 1-3) or fever (or Fever >/= 101).   albuterol  (2.5 MG/3ML) 0.083% nebulizer solution Commonly known as:  PROVENTIL  Take 3 mLs (2.5 mg total) by nebulization every 6 (six) hours as needed for wheezing or shortness of breath.   albuterol  108 (90 Base) MCG/ACT inhaler Commonly known as: VENTOLIN  HFA INHALE 2 PUFFS BY MOUTH EVERY 6 HOURS AS NEEDED FOR WHEEZING FOR SHORTNESS OF BREATH   ALPRAZolam  1 MG tablet Commonly known as: XANAX  TAKE 1/2 (ONE-HALF) TABLET BY MOUTH THREE TIMES DAILY AND 1 TABLET AT BEDTIME   baclofen  10 MG tablet Commonly known as: LIORESAL  TAKE 1/2 (ONE-HALF) TABLET BY MOUTH TWICE DAILY AS NEEDED FOR MUSCLE SPASM   blood glucose meter kit and supplies Dispense based on patient and insurance preference. Use up to four times daily as directed. (FOR ICD-10 E10.9, E11.9).   CALCIUM  600 PO Take 1 tablet by mouth daily in the afternoon.   Cartia  XT 240 MG 24 hr capsule Generic drug: diltiazem  Take 1 capsule by mouth once daily   cholecalciferol 25 MCG (1000 UNIT) tablet Commonly known as: VITAMIN D3 Take 1,000 Units by mouth daily.   empagliflozin  10 MG Tabs tablet Commonly known as: Jardiance  Take 1 tablet (10 mg total) by mouth daily before breakfast.   fenofibrate  160 MG tablet Take 1 tablet (160 mg total) by mouth daily.   fluticasone  50 MCG/ACT nasal spray Commonly known as: FLONASE  Place into both nostrils daily.   FreeStyle Libre 3 Plus Sensor Misc Inject  1 each into the skin every 14 (fourteen) days. Change sensor every 15 days.   furosemide  20 MG tablet Commonly known as: LASIX  Take 1 tablet (20 mg total) by mouth 2 (two) times daily. What changed:  when to take this reasons to take this   ipratropium 0.06 % nasal spray Commonly known as: ATROVENT  Place 2 sprays into both nostrils every 6 (six) hours as needed for rhinitis. What changed: when to take this   Lantus  SoloStar 100 UNIT/ML Solostar Pen Generic drug: insulin  glargine Inject 40 Units into the skin daily. What changed: how much to take   levothyroxine  50 MCG tablet Commonly known  as: SYNTHROID  Take 1 tablet (50 mcg total) by mouth daily before breakfast. TAKE 1 TABLET BY MOUTH ONCE DAILY FOR THYROID    magnesium oxide 400 MG tablet Commonly known as: MAG-OX Take 400 mg by mouth 2 (two) times daily.   metFORMIN  1000 MG tablet Commonly known as: GLUCOPHAGE  TAKE 1 TABLET BY MOUTH TWICE DAILY WITH A MEAL   metoprolol  succinate 50 MG 24 hr tablet Commonly known as: TOPROL -XL Take 1 tablet (50 mg total) by mouth daily. Take with or immediately following a meal. What changed:  medication strength how much to take   MIRALAX PO Take 17 g by mouth every evening.   multivitamin with minerals Tabs tablet Take 1 tablet by mouth daily.   Omega 3 1000 MG Caps Take 1,000 mg by mouth daily.   omeprazole  20 MG capsule Commonly known as: PRILOSEC Take 1 capsule (20 mg total) by mouth 2 (two) times daily before a meal.   potassium chloride  10 MEQ tablet Commonly known as: KLOR-CON  M Take 1 tablet (10 mEq total) by mouth 2 (two) times daily. What changed:  when to take this reasons to take this   promethazine -dextromethorphan  6.25-15 MG/5ML syrup Commonly known as: PROMETHAZINE -DM Take 5 mLs by mouth at bedtime as needed for cough.   rosuvastatin  10 MG tablet Commonly known as: CRESTOR  Take 1 tablet (10 mg total) by mouth daily.   sertraline  50 MG tablet Commonly known as: ZOLOFT  Take 1 tablet (50 mg total) by mouth daily.   Symbicort  160-4.5 MCG/ACT inhaler Generic drug: budesonide -formoterol  Inhale 2 puffs into the lungs in the morning and at bedtime.   tirzepatide  5 MG/0.5ML Pen Commonly known as: MOUNJARO  Inject 5 mg into the skin once a week.   traMADol  50 MG tablet Commonly known as: ULTRAM  Take 1 tablet by mouth every 6 (six) hours as needed.   VITAMIN E PO Take by mouth.        Follow-up Information     Wendolyn Jenkins Jansky, MD Follow up.   Specialty: Family Medicine Contact information: [address] [place]  [address] [phone]                Allergies[1]  Consultations: ***Specify Physician/Group   Procedures/Studies: DG Chest Port 1 View if patient is in a treatment room. Result Date: 12/19/2024 EXAM: 1 VIEW(S) XRAY OF THE CHEST 12/19/2024 12:18:00 AM COMPARISON: 06/08/2024 CLINICAL HISTORY: Suspected sepsis. FINDINGS: LUNGS AND PLEURA: Low lung volumes. Stable diffuse interstitial coarsening. No focal pulmonary opacity. No pleural effusion. No pneumothorax. HEART AND MEDIASTINUM: Aortic atherosclerotic calcification. BONES AND SOFT TISSUES: No acute osseous abnormality. IMPRESSION: 1. No acute cardiopulmonary abnormality. 2. Stable diffuse interstitial coarsening. Electronically signed by: Oneil Devonshire MD 12/19/2024 12:25 AM EST RP Workstation: [id number]   (Echo, Carotid, EGD, Colonoscopy, ERCP)    Subjective:   Discharge Exam: Vitals:  12/21/24 0924 12/21/24 1043  BP: 133/76   Pulse: 86   Resp:    Temp:    SpO2:  94%   Vitals:   12/20/24 1813 12/21/24 0528 12/21/24 0924 12/21/24 1043  BP: (!) 137/50 (!) 132/96 133/76   Pulse: 75 77 86   Resp:  15    Temp: 98.2 F (36.8 C) (!) 97.5 F (36.4 C)    TempSrc:      SpO2: 97%   94%  Weight:      Height:        General: Pt is alert, awake, not in acute distress Cardiovascular: RRR, S1/S2 +, no rubs, no gallops Respiratory: CTA bilaterally, no wheezing, no rhonchi Abdominal: Soft, NT, ND, bowel sounds + Extremities: no edema, no cyanosis    The results of significant diagnostics from this hospitalization (including imaging, microbiology, ancillary and laboratory) are listed below for reference.     Microbiology: Recent Results (from the past 240 hours)  Resp panel by RT-PCR (RSV, Flu A&B, Covid) Anterior Nasal Swab     Status: Abnormal   Collection Time: 12/18/24 12:36 AM   Specimen: Anterior Nasal Swab  Result Value Ref Range Status   SARS Coronavirus 2 by RT PCR POSITIVE (A) NEGATIVE Final    Comment:  (NOTE) SARS-CoV-2 target nucleic acids are DETECTED.  The SARS-CoV-2 RNA is generally detectable in upper respiratory specimens during the acute phase of infection. Positive results are indicative of the presence of the identified virus, but do not rule out bacterial infection or co-infection with other pathogens not detected by the test. Clinical correlation with patient history and other diagnostic information is necessary to determine patient infection status. The expected result is Negative.  Fact Sheet for Patients: bloggercourse.com  Fact Sheet for Healthcare Providers: seriousbroker.it  This test is not yet approved or cleared by the [place]  FDA and  has been authorized for detection and/or diagnosis of SARS-CoV-2 by FDA under an Emergency Use Authorization (EUA).  This EUA will remain in effect (meaning this test can be used) for the duration of  the COVID-19 declaration under Section 564(b)(1) of the A ct, 21 U.S.C. section 360bbb-3(b)(1), unless the authorization is terminated or revoked sooner.     Influenza A by PCR NEGATIVE NEGATIVE Final   Influenza B by PCR NEGATIVE NEGATIVE Final    Comment: (NOTE) The Xpert Xpress SARS-CoV-2/FLU/RSV plus assay is intended as an aid in the diagnosis of influenza from Nasopharyngeal swab specimens and should not be used as a sole basis for treatment. Nasal washings and aspirates are unacceptable for Xpert Xpress SARS-CoV-2/FLU/RSV testing.  Fact Sheet for Patients: bloggercourse.com  Fact Sheet for Healthcare Providers: seriousbroker.it  This test is not yet approved or cleared by the [place]  FDA and has been authorized for detection and/or diagnosis of SARS-CoV-2 by FDA under an Emergency Use Authorization (EUA). This EUA will remain in effect (meaning this test can be used) for the duration of the COVID-19  declaration under Section 564(b)(1) of the Act, 21 U.S.C. section 360bbb-3(b)(1), unless the authorization is terminated or revoked.     Resp Syncytial Virus by PCR NEGATIVE NEGATIVE Final    Comment: (NOTE) Fact Sheet for Patients: bloggercourse.com  Fact Sheet for Healthcare Providers: seriousbroker.it  This test is not yet approved or cleared by the [place]  FDA and has been authorized for detection and/or diagnosis of SARS-CoV-2 by FDA under an Emergency Use Authorization (EUA). This EUA will remain in effect (meaning this test can  be used) for the duration of the COVID-19 declaration under Section 564(b)(1) of the Act, 21 U.S.C. section 360bbb-3(b)(1), unless the authorization is terminated or revoked.  Performed at [facility], [address] [place], [place] [address]   Blood Culture (routine x 2)     Status: None (Preliminary result)   Collection Time: 12/19/24 12:08 AM   Specimen: BLOOD RIGHT FOREARM  Result Value Ref Range Status   Specimen Description   Final    BLOOD RIGHT FOREARM Performed at [facility], [address] [place], [place] [address]    Special Requests   Final    BOTTLES DRAWN AEROBIC AND ANAEROBIC Blood Culture adequate volume Performed at [facility], [address], [place], [place] [address]    Culture   Final    NO GROWTH 1 DAY Performed at [facility], [address] [place], [place] [address]    Report Status PENDING  Incomplete  Blood Culture (routine x 2)     Status: None (Preliminary result)   Collection Time: 12/19/24 12:11 AM   Specimen: BLOOD LEFT HAND  Result Value Ref Range Status   Specimen Description   Final    BLOOD LEFT HAND Performed at [facility], [address], [place], [place] [address]    Special Requests   Final    BOTTLES DRAWN AEROBIC AND ANAEROBIC Blood Culture adequate volume Performed at  [facility], [address], [place], [place] [address]    Culture   Final    NO GROWTH 1 DAY Performed at [facility], [address] [place], [place] [address]    Report Status PENDING  Incomplete  Culture, blood (Routine x 2)     Status: None (Preliminary result)   Collection Time: 12/19/24  3:35 PM   Specimen: BLOOD RIGHT ARM  Result Value Ref Range Status   Specimen Description   Final    BLOOD RIGHT ARM Performed at [facility], [address], [place], [place] [address]    Special Requests   Final    BOTTLES DRAWN AEROBIC AND ANAEROBIC Blood Culture adequate volume Performed at [facility], [address] [place], [place] [address]    Culture   Final    NO GROWTH < 12 HOURS Performed at [facility], [address], [place], [place] [address]    Report Status PENDING  Incomplete  Culture, blood (Routine x 2)     Status: None (Preliminary result)   Collection Time: 12/19/24  3:39 PM   Specimen: BLOOD RIGHT ARM  Result Value Ref Range Status   Specimen Description   Final    BLOOD RIGHT ARM Performed at [facility], [address] [place], [place] [address]    Special Requests   Final    BOTTLES DRAWN AEROBIC ONLY Blood Culture adequate volume Performed at [facility], [address], [place], [place] [address]    Culture   Final    NO GROWTH < 12 HOURS Performed at [facility], [address] [place], [place] [address]    Report Status PENDING  Incomplete     Labs: BNP (last 3 results) No results for input(s): BNP in the last 8760 hours. Basic Metabolic Panel: Recent Labs  Lab 12/19/24 0010 12/19/24 0821 12/20/24 0603 12/21/24 0510  NA 139  --  143 143  K 3.8  --  3.8 4.0  CL 106  --  107 106  CO2 20*  --  28 28  GLUCOSE 198*  --  84 88  BUN 16  --  9 12  CREATININE 0.81 0.78 0.79 0.98  CALCIUM  9.2  --  9.2 9.6  MG  --   --  1.9  --    Liver Function  Tests: Recent Labs  Lab 12/19/24 0010 12/21/24 0510  AST 25 27  ALT 20 20  ALKPHOS 83 68  BILITOT 0.4 0.2  PROT 6.9 7.0  ALBUMIN 3.8 3.7   No results for input(s): LIPASE, AMYLASE in the last 168 hours. No results for input(s): AMMONIA in the last 168 hours. CBC: Recent Labs  Lab 12/19/24 0010 12/19/24 0902 12/20/24 0603 12/21/24 0510  WBC 8.9 6.3 4.0 3.7*  NEUTROABS 7.6  --  1.9  --   HGB 13.2 12.2 12.5 13.2  HCT 41.1 37.4 41.1 42.2  MCV 82.7 81.7 85.4 83.6  PLT 219 239 233 228   Cardiac Enzymes: No results for input(s): CKTOTAL, CKMB, CKMBINDEX, TROPONINI in the last 168 hours. BNP: Invalid input(s): POCBNP CBG: Recent Labs  Lab 12/20/24 0810 12/20/24 1210 12/20/24 1621 12/20/24 2034 12/21/24 0759  GLUCAP 89 106* 108* 163* 96   D-Dimer No results for input(s): DDIMER in the last 72 hours. Hgb A1c No results for input(s): HGBA1C in the last 72 hours. Lipid Profile No results for input(s): CHOL, HDL, LDLCALC, TRIG, CHOLHDL, LDLDIRECT in the last 72 hours. Thyroid  function studies Recent Labs    12/19/24 0821  TSH 0.806   Anemia work up Recent Labs    12/20/24 0603 12/21/24 0510  FERRITIN 444* 541*   Urinalysis    Component Value Date/Time   COLORURINE STRAW (A) 12/19/2024 0752   APPEARANCEUR HAZY (A) 12/19/2024 0752   LABSPEC 1.008 12/19/2024 0752   PHURINE 6.0 12/19/2024 0752   GLUCOSEU NEGATIVE 12/19/2024 0752   HGBUR NEGATIVE 12/19/2024 0752   BILIRUBINUR NEGATIVE 12/19/2024 0752   KETONESUR NEGATIVE 12/19/2024 0752   PROTEINUR NEGATIVE 12/19/2024 0752   NITRITE NEGATIVE 12/19/2024 0752   LEUKOCYTESUR NEGATIVE 12/19/2024 0752   Sepsis Labs Recent Labs  Lab 12/19/24 0010 12/19/24 0902 12/20/24 0603 12/21/24 0510  WBC 8.9 6.3 4.0 3.7*   Microbiology Recent Results (from the past 240 hours)  Resp panel by RT-PCR (RSV, Flu A&B, Covid) Anterior Nasal Swab     Status: Abnormal   Collection Time:  12/18/24 12:36 AM   Specimen: Anterior Nasal Swab  Result Value Ref Range Status   SARS Coronavirus 2 by RT PCR POSITIVE (A) NEGATIVE Final    Comment: (NOTE) SARS-CoV-2 target nucleic acids are DETECTED.  The SARS-CoV-2 RNA is generally detectable in upper respiratory specimens during the acute phase of infection. Positive results are indicative of the presence of the identified virus, but do not rule out bacterial infection or co-infection with other pathogens not detected by the test. Clinical correlation with patient history and other diagnostic information is necessary to determine patient infection status. The expected result is Negative.  Fact Sheet for Patients: bloggercourse.com  Fact Sheet for Healthcare Providers: seriousbroker.it  This test is not yet approved or cleared by the [place]  FDA and  has been authorized for detection and/or diagnosis of SARS-CoV-2 by FDA under an Emergency Use Authorization (EUA).  This EUA will remain in effect (meaning this test can be used) for the duration of  the COVID-19 declaration under Section 564(b)(1) of the A ct,  21 U.S.C. section 360bbb-3(b)(1), unless the authorization is terminated or revoked sooner.     Influenza A by PCR NEGATIVE NEGATIVE Final   Influenza B by PCR NEGATIVE NEGATIVE Final    Comment: (NOTE) The Xpert Xpress SARS-CoV-2/FLU/RSV plus assay is intended as an aid in the diagnosis of influenza from Nasopharyngeal swab specimens and should not be used as a sole basis for treatment. Nasal washings and aspirates are unacceptable for Xpert Xpress SARS-CoV-2/FLU/RSV testing.  Fact Sheet for Patients: bloggercourse.com  Fact Sheet for Healthcare Providers: seriousbroker.it  This test is not yet approved or cleared by the [place]  FDA and has been authorized for detection and/or diagnosis of SARS-CoV-2  by FDA under an Emergency Use Authorization (EUA). This EUA will remain in effect (meaning this test can be used) for the duration of the COVID-19 declaration under Section 564(b)(1) of the Act, 21 U.S.C. section 360bbb-3(b)(1), unless the authorization is terminated or revoked.     Resp Syncytial Virus by PCR NEGATIVE NEGATIVE Final    Comment: (NOTE) Fact Sheet for Patients: bloggercourse.com  Fact Sheet for Healthcare Providers: seriousbroker.it  This test is not yet approved or cleared by the [place]  FDA and has been authorized for detection and/or diagnosis of SARS-CoV-2 by FDA under an Emergency Use Authorization (EUA). This EUA will remain in effect (meaning this test can be used) for the duration of the COVID-19 declaration under Section 564(b)(1) of the Act, 21 U.S.C. section 360bbb-3(b)(1), unless the authorization is terminated or revoked.  Performed at [facility], [address], [place], [place] [address]   Blood Culture (routine x 2)     Status: None (Preliminary result)   Collection Time: 12/19/24 12:08 AM   Specimen: BLOOD RIGHT FOREARM  Result Value Ref Range Status   Specimen Description   Final    BLOOD RIGHT FOREARM Performed at [facility], [address], [place], [place] [address]    Special Requests   Final    BOTTLES DRAWN AEROBIC AND ANAEROBIC Blood Culture adequate volume Performed at [facility], [address], [place], [place] [address]    Culture   Final    NO GROWTH 1 DAY Performed at [facility], [address], [place], [place] [address]    Report Status PENDING  Incomplete  Blood Culture (routine x 2)     Status: None (Preliminary result)   Collection Time: 12/19/24 12:11 AM   Specimen: BLOOD LEFT HAND  Result Value Ref Range Status   Specimen Description   Final    BLOOD LEFT HAND Performed at [facility], [address] [place], [place] [address]    Special Requests   Final    BOTTLES DRAWN AEROBIC AND ANAEROBIC Blood Culture adequate volume Performed at [facility], [address], [place], [place] [address]    Culture   Final    NO GROWTH 1 DAY Performed at [facility], [address] [place], [place] [address]    Report Status PENDING  Incomplete  Culture, blood (Routine x 2)     Status: None (Preliminary result)   Collection Time: 12/19/24  3:35 PM   Specimen: BLOOD RIGHT ARM  Result Value Ref Range Status   Specimen Description   Final    BLOOD RIGHT ARM Performed at [facility], [address], [place], [place] [address]    Special Requests   Final    BOTTLES  DRAWN AEROBIC AND ANAEROBIC Blood Culture adequate volume Performed at [facility], [address], [place], [place] [address]    Culture   Final    NO GROWTH < 12 HOURS Performed at [facility], [address], [place], [place] [address]    Report Status PENDING  Incomplete  Culture, blood (Routine x 2)     Status: None (Preliminary result)   Collection Time: 12/19/24  3:39 PM   Specimen: BLOOD RIGHT ARM  Result Value Ref Range Status   Specimen Description   Final    BLOOD RIGHT ARM Performed at [facility], [address] [place], [place] [address]    Special Requests   Final    BOTTLES DRAWN AEROBIC ONLY Blood Culture adequate volume Performed at [facility], [address] [place], [place] [address]    Culture   Final    NO GROWTH < 12 HOURS Performed at [facility], [address], [place], [place] [address]    Report Status PENDING  Incomplete     Time coordinating discharge: 39 min SIGNED:   Almarie KANDICE Hoots, MD  [facility] 12/21/2024, 11:52 AM     [1]  Allergies Allergen Reactions   Celecoxib Anaphylaxis and Shortness Of Breath    12/19/24 pt agreeable to using ibuprofen  short-term for  fever and it was tolerated.   Glipizide Itching   Penicillins Other (See Comments)    Burn marks from inside out    Sulfa Antibiotics Itching

## 2024-12-22 ENCOUNTER — Telehealth: Payer: Self-pay

## 2024-12-22 NOTE — Transitions of Care (Post Inpatient/ED Visit) (Unsigned)
" ° °  12/22/2024  Name: Teresa Rice MRN: [id number] DOB: 1946/02/08  Today's TOC FU Call Status: Today's TOC FU Call Status:: Unsuccessful Call (1st Attempt) Unsuccessful Call (1st Attempt) Date: 12/22/24  Attempted to reach the patient regarding the most recent Inpatient/ED visit.  Follow Up Plan: Additional outreach attempts will be made to reach the patient to complete the Transitions of Care (Post Inpatient/ED visit) call.   Signature Julian Lemmings, LPN [facility] Nurse Health Advisor Direct Dial [phone]  "

## 2024-12-23 ENCOUNTER — Telehealth: Payer: Self-pay

## 2024-12-23 ENCOUNTER — Ambulatory Visit: Admitting: Family Medicine

## 2024-12-23 ENCOUNTER — Encounter: Payer: Self-pay | Admitting: Family Medicine

## 2024-12-23 ENCOUNTER — Inpatient Hospital Stay: Admitting: Family Medicine

## 2024-12-23 VITALS — BP 112/60 | HR 84 | Temp 97.4°F | Ht 64.0 in | Wt 167.2 lb

## 2024-12-23 DIAGNOSIS — U071 COVID-19: Secondary | ICD-10-CM

## 2024-12-23 DIAGNOSIS — E1159 Type 2 diabetes mellitus with other circulatory complications: Secondary | ICD-10-CM

## 2024-12-23 DIAGNOSIS — E119 Type 2 diabetes mellitus without complications: Secondary | ICD-10-CM

## 2024-12-23 DIAGNOSIS — J4541 Moderate persistent asthma with (acute) exacerbation: Secondary | ICD-10-CM

## 2024-12-23 NOTE — Progress Notes (Signed)
 "  Subjective:     Patient ID: Teresa Rice, female    DOB: 09/09/1946, 79 y.o.   MRN: [id number]  Chief Complaint  Patient presents with   Follow-up    Pt is here for [facility] follow up    Discussed the use of AI scribe software for clinical note transcription with the patient, who gave verbal consent to proceed.  History of Present Illness Teresa Rice is a 79 year old female with a history of asthma who presents with fatigue and cough following a recent COVID-19 infection.  She was diagnosed with COVID-19 on December 18, 2024-1/26, after experiencing weakness and a fall. During her hospital stay, her oxygen levels dropped to 89%, and she received oxygen for a few minutes.she received IVF. Hosp 2 days.  Since returning home, she feels tired and has been resting. No shortness of breath is present, but she continues to experience a persistent cough, which she attributes to using a breathing device. No fever is present, and her blood sugar was monitored frequently during her hospital stay.  She has a history of asthma and notes that her left lung was identified as having a wheeze during her hospital stay. She is currently using Symbicort  twice daily and albuterol  as needed, which she started yesterday. She finds it challenging to use a spirometer as frequently as recommended.  Her current medications include metoprolol , which was reduced to 50 mg due to low blood pressure during her illness. She is also on Lantus  and had been receiving heparin  injections, which have caused bruising on her stomach. She is monitoring her blood pressure at home and reports it as 112/60 today.  She follows a diet low in carbs, sugar, and salt to manage her health conditions.  She recalls the onset of her symptoms as last Friday, which aligns with her COVID-19 diagnosis. She suspects she contracted the virus from her granddaughter or great-granddaughter, who were also unwell around the same time.  No vomiting,  diarrhea, shortness of breath, or fever. She reports fatigue, persistent cough, and wheezing in the left lung.    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   Mild intermittent asthma    OA (osteoarthritis)    fingers    Palpitations 09/04/2022   Trigger finger of both hands    wear slints and special gloves at night   Type 2 diabetes mellitus [facility])     Past Surgical History:  Procedure Laterality Date   BLADDER SURGERY  2012   in [place]   TURBT   CHOLECYSTECTOMY  11/26/1968   CYSTOSCOPY/RETROGRADE/URETEROSCOPY Left 05/24/2014   Procedure: CYSTOSCOPY/RETROGRADE/ URETEROSCOPY;  Surgeon: Garnette CHRISTELLA Matilda, MD;  Location: [facility];  Service: Urology;  Laterality: Left;   TRANSURETHRAL RESECTION OF BLADDER TUMOR N/A 05/24/2014   Procedure: TRANSURETHRAL RESECTION OF BLADDER TUMOR (TURBT) ;  Surgeon: Garnette CHRISTELLA Matilda, MD;  Location: [facility];  Service: Urology;  Laterality: N/A;   TRANSURETHRAL RESECTION OF BLADDER TUMOR N/A 08/04/2015  Procedure: TRANSURETHRAL RESECTION OF BLADDER TUMOR (TURBT);  Surgeon: Garnette Shack, MD;  Location: [facility];  Service: Urology;  Laterality: N/A;   WRIST SURGERY Right    x3    Current Medications[1]  Allergies[2] ROS neg/noncontributory except as noted HPI/below      Objective:     BP 112/60 (BP Location: Left Arm, Patient Position: Sitting, Cuff Size: Normal)   Pulse 84   Temp (!) 97.4 F (36.3 C) (Temporal)   Ht 5' 4 (1.626 m)   Wt 167 lb 4 oz (75.9  kg)   LMP  (LMP Unknown)   SpO2 94%   BMI 28.71 kg/m  Wt Readings from Last 3 Encounters:  12/23/24 167 lb 4 oz (75.9 kg)  12/19/24 166 lb (75.3 kg)  12/11/24 165 lb 6 oz (75 kg)    Physical Exam VITALS: BP- 112/60 GENERAL: Well developed, well nourished, no acute distress. HEAD EYES EARS NOSE THROAT: Normocephalic, atraumatic, conjunctiva not injected, sclera nonicteric. +congested CARDIAC: Regular rate and rhythm, S1 S2 present, no murmur NECK: Supple, no thyromegaly, no nodes. LUNGS: Mild wheeze at end of expiration. Good AE ABDOMEN: Bowel sounds present, soft, non-tender, non-distended, no hepatosplenomegaly, no masses. EXTREMITIES: No edema. MUSCULOSKELETAL: No gross abnormalities. NEUROLOGICAL: Alert and oriented x3, cranial nerves II through XII intact. PSYCHIATRIC: Normal mood, good eye contact.   Reviewed hosp records.  Reviewed meds-no discrepency and no addn'l changes to list    Assessment & Plan:  COVID-19 -     CBC with Differential/Platelet -     Basic metabolic panel with GFR  Type 2 diabetes mellitus with insulin  therapy (HCC)  Hypertension associated with diabetes (HCC)  Moderate persistent asthma with exacerbation    Assessment and Plan Assessment & Plan COVID-19   Diagnosed on January 23rd, 2025, with symptoms of weakness, fatigue, and cough. Hospitalized for dehydration and treated with fluids and antibiotics. No pneumonia was present. Oxygen saturation dropped to 89% during hospitalization. She is currently at the peak of illness and is expected to improve over the next two weeks. No fever or shortness of breath is noted. The cough is likely exacerbated by spirometer use and inflammation. No antibiotics are indicated as COVID is viral. Continue Symbicort  twice daily and use albuterol  every six hours as needed for cough or wheezing. Perform deep breathing exercises hourly to prevent pneumonia. Wear a mask for the next five days when around others to  prevent transmission. Monitor for worsening symptoms such as increased weakness or shortness of breath and return to ER if symptoms worsen.  Asthma   Exacerbated by COVID-19, leading to cough and wheezing. Wheezing is noted but is not severe enough to warrant prednisone . Continue Symbicort  twice daily and use albuterol  every six hours as needed for wheezing or shortness of breath or cough.  Type 2 diabetes mellitus   Blood sugar levels were monitored during hospitalization. Current levels are slightly elevated but not concerning. No immediate changes to diabetes management are required. Continue the current diabetes management plan.  Hypertension   Blood pressure was low during hospitalization, likely due to COVID-19 and dehydration. The metoprolol  dose was reduced to 50 mg. Blood pressure is currently stable at 112/60 mmHg. Monitor blood pressure at home every other day and increase the metoprolol  dose if blood pressure rises significantly.     Return if symptoms worsen or fail to improve.  Jenkins CHRISTELLA Carrel, MD     [1]  Current Outpatient Medications:    acetaminophen  (TYLENOL ) 325  MG tablet, Take 2 tablets (650 mg total) by mouth every 6 (six) hours as needed for mild pain (pain score 1-3) or fever (or Fever >/= 101)., Disp: , Rfl:    albuterol  (PROVENTIL ) (2.5 MG/3ML) 0.083% nebulizer solution, Take 3 mLs (2.5 mg total) by nebulization every 6 (six) hours as needed for wheezing or shortness of breath., Disp: 150 mL, Rfl: 1   albuterol  (VENTOLIN  HFA) 108 (90 Base) MCG/ACT inhaler, INHALE 2 PUFFS BY MOUTH EVERY 6 HOURS AS NEEDED FOR WHEEZING FOR SHORTNESS OF BREATH, Disp: 36 g, Rfl: 0   ALPRAZolam  (XANAX ) 1 MG tablet, TAKE 1/2 (ONE-HALF) TABLET BY MOUTH THREE TIMES DAILY AND 1 TABLET AT BEDTIME, Disp: 75 tablet, Rfl: 5   baclofen  (LIORESAL ) 10 MG tablet, TAKE 1/2 (ONE-HALF) TABLET BY MOUTH TWICE DAILY AS NEEDED FOR MUSCLE SPASM, Disp: 30 tablet, Rfl: 1   blood glucose meter kit and supplies,  Dispense based on patient and insurance preference. Use up to four times daily as directed. (FOR ICD-10 E10.9, E11.9)., Disp: 1 each, Rfl: 0   Calcium  Carbonate (CALCIUM  600 PO), Take 1 tablet by mouth daily in the afternoon., Disp: , Rfl:    cholecalciferol (VITAMIN D3) 25 MCG (1000 UNIT) tablet, Take 1,000 Units by mouth daily., Disp: , Rfl:    Continuous Glucose Sensor (FREESTYLE LIBRE 3 PLUS SENSOR) MISC, Inject 1 each into the skin every 14 (fourteen) days. Change sensor every 15 days., Disp: 6 each, Rfl: 3   diltiazem  (CARTIA  XT) 240 MG 24 hr capsule, Take 1 capsule by mouth once daily, Disp: 90 capsule, Rfl: 2   empagliflozin  (JARDIANCE ) 10 MG TABS tablet, Take 1 tablet (10 mg total) by mouth daily before breakfast., Disp: 90 tablet, Rfl: 1   fenofibrate  160 MG tablet, Take 1 tablet (160 mg total) by mouth daily., Disp: 90 tablet, Rfl: 1   fluticasone  (FLONASE ) 50 MCG/ACT nasal spray, Place into both nostrils daily., Disp: , Rfl:    furosemide  (LASIX ) 20 MG tablet, Take 1 tablet (20 mg total) by mouth 2 (two) times daily. (Patient taking differently: Take 20 mg by mouth as needed.), Disp: 180 tablet, Rfl: 1   ipratropium (ATROVENT ) 0.06 % nasal spray, Place 2 sprays into both nostrils every 6 (six) hours as needed for rhinitis. (Patient taking differently: Place 2 sprays into both nostrils daily.), Disp: 15 mL, Rfl: 12   LANTUS  SOLOSTAR 100 UNIT/ML Solostar Pen, Inject 40 Units into the skin daily. (Patient taking differently: Inject 38 Units into the skin daily.), Disp: 45 mL, Rfl: 3   levothyroxine  (SYNTHROID ) 50 MCG tablet, Take 1 tablet (50 mcg total) by mouth daily before breakfast. TAKE 1 TABLET BY MOUTH ONCE DAILY FOR THYROID , Disp: 90 tablet, Rfl: 1   magnesium oxide (MAG-OX) 400 MG tablet, Take 400 mg by mouth 2 (two) times daily., Disp: , Rfl:    metFORMIN  (GLUCOPHAGE ) 1000 MG tablet, TAKE 1 TABLET BY MOUTH TWICE DAILY WITH A MEAL, Disp: 180 tablet, Rfl: 1   metoprolol  succinate  (TOPROL -XL) 50 MG 24 hr tablet, Take 1 tablet (50 mg total) by mouth daily. Take with or immediately following a meal., Disp: 30 tablet, Rfl: 2   Multiple Vitamin (MULTIVITAMIN WITH MINERALS) TABS tablet, Take 1 tablet by mouth daily., Disp: , Rfl:    Omega 3 1000 MG CAPS, Take 1,000 mg by mouth daily., Disp: , Rfl:    omeprazole  (PRILOSEC) 20 MG capsule, Take 1 capsule (20 mg total) by mouth 2 (two) times daily before a  meal., Disp: 180 capsule, Rfl: 1   Polyethylene Glycol 3350 (MIRALAX PO), Take 17 g by mouth every evening., Disp: , Rfl:    potassium chloride  (KLOR-CON  M) 10 MEQ tablet, Take 1 tablet (10 mEq total) by mouth 2 (two) times daily. (Patient taking differently: Take 10 mEq by mouth as needed (with furosemide ).), Disp: 180 tablet, Rfl: 1   promethazine -dextromethorphan  (PROMETHAZINE -DM) 6.25-15 MG/5ML syrup, Take 5 mLs by mouth at bedtime as needed for cough., Disp: 118 mL, Rfl: 1   rosuvastatin  (CRESTOR ) 10 MG tablet, Take 1 tablet (10 mg total) by mouth daily., Disp: 90 tablet, Rfl: 1   sertraline  (ZOLOFT ) 50 MG tablet, Take 1 tablet (50 mg total) by mouth daily., Disp: 90 tablet, Rfl: 2   SYMBICORT  160-4.5 MCG/ACT inhaler, Inhale 2 puffs into the lungs in the morning and at bedtime., Disp: 33 g, Rfl: 3   tirzepatide  (MOUNJARO ) 5 MG/0.5ML Pen, Inject 5 mg into the skin once a week., Disp: 6 mL, Rfl: 0   traMADol  (ULTRAM ) 50 MG tablet, Take 1 tablet by mouth every 6 (six) hours as needed., Disp: , Rfl:    VITAMIN E PO, Take by mouth., Disp: , Rfl:  [2]  Allergies Allergen Reactions   Celecoxib Anaphylaxis and Shortness Of Breath    12/19/24 pt agreeable to using ibuprofen  short-term for fever and it was tolerated.   Glipizide Itching   Penicillins Other (See Comments)    Burn marks from inside out    Sulfa Antibiotics Itching   "

## 2024-12-23 NOTE — Transitions of Care (Post Inpatient/ED Visit) (Unsigned)
" ° °  12/23/2024  Name: Teresa Rice MRN: [id number] DOB: July 30, 1946  Today's TOC FU Call Status: Today's TOC FU Call Status:: Unsuccessful Call (2nd Attempt) Unsuccessful Call (1st Attempt) Date: 12/22/24 Unsuccessful Call (2nd Attempt) Date: 12/23/24  Attempted to reach the patient regarding the most recent Inpatient/ED visit.  Follow Up Plan: Additional outreach attempts will be made to reach the patient to complete the Transitions of Care (Post Inpatient/ED visit) call.   Signature Julian Lemmings, LPN [facility] Nurse Health Advisor Direct Dial [phone]  "

## 2024-12-23 NOTE — Telephone Encounter (Signed)
 Copied from CRM #[id number]. Topic: General - Other >> Dec 23, 2024 12:57 PM [name] wrote: Reason for CRM: Patient called said she keeps getting disconnected from call with Lanette. Stated she will speak with provider when she comes in later today.

## 2024-12-23 NOTE — Telephone Encounter (Signed)
 Copied from CRM #[id number]. Topic: General - Other >> Dec 23, 2024 12:44 PM Robinson H wrote: Reason for CRM: Patient returning call to Lanette per notes, reached out to number in message and no answer.  Irisa [phone] >> Dec 23, 2024 12:47 PM Robinson H wrote: Call was disconnected while patient was on hold, tried to reach back out twice but call went to voicemail

## 2024-12-23 NOTE — Patient Instructions (Addendum)
 Do some deep breaths every hour while awake  Albuterol  every 6 hrs as needed  Worse, ER  Monitor blood pressures

## 2024-12-24 ENCOUNTER — Ambulatory Visit: Payer: Self-pay | Admitting: Family Medicine

## 2024-12-24 LAB — CBC WITH DIFFERENTIAL/PLATELET
Basophils Absolute: 0.1 10*3/uL (ref 0.0–0.1)
Basophils Relative: 1.2 % (ref 0.0–3.0)
Eosinophils Absolute: 0 10*3/uL (ref 0.0–0.7)
Eosinophils Relative: 0.4 % (ref 0.0–5.0)
HCT: 39.3 % (ref 36.0–46.0)
Hemoglobin: 13 g/dL (ref 12.0–15.0)
Lymphocytes Relative: 25.4 % (ref 12.0–46.0)
Lymphs Abs: 1.2 10*3/uL (ref 0.7–4.0)
MCHC: 33 g/dL (ref 30.0–36.0)
MCV: 80.3 fl (ref 78.0–100.0)
Monocytes Absolute: 0.5 10*3/uL (ref 0.1–1.0)
Monocytes Relative: 10.9 % (ref 3.0–12.0)
Neutro Abs: 3 10*3/uL (ref 1.4–7.7)
Neutrophils Relative %: 62.1 % (ref 43.0–77.0)
Platelets: 278 10*3/uL (ref 150.0–400.0)
RBC: 4.89 Mil/uL (ref 3.87–5.11)
RDW: 15.5 % (ref 11.5–15.5)
WBC: 4.8 10*3/uL (ref 4.0–10.5)

## 2024-12-24 LAB — BASIC METABOLIC PANEL WITH GFR
BUN: 20 mg/dL (ref 6–23)
CO2: 28 meq/L (ref 19–32)
Calcium: 9.5 mg/dL (ref 8.4–10.5)
Chloride: 102 meq/L (ref 96–112)
Creatinine, Ser: 1.67 mg/dL — ABNORMAL HIGH (ref 0.40–1.20)
GFR: 29.01 mL/min — ABNORMAL LOW
Glucose, Bld: 97 mg/dL (ref 70–99)
Potassium: 3.9 meq/L (ref 3.5–5.1)
Sodium: 138 meq/L (ref 135–145)

## 2024-12-24 LAB — CULTURE, BLOOD (ROUTINE X 2)
Culture: NO GROWTH
Culture: NO GROWTH
Special Requests: ADEQUATE
Special Requests: ADEQUATE

## 2024-12-24 NOTE — Progress Notes (Signed)
 Kidney function changed.  Make sure drinking plenty of water .  Repeat BMP in next week-dx elevated creat

## 2024-12-24 NOTE — Transitions of Care (Post Inpatient/ED Visit) (Signed)
" ° °  12/24/2024  Name: Teresa Rice MRN: [id number] DOB: 26-Aug-1946  Today's TOC FU Call Status: Today's TOC FU Call Status:: Unsuccessful Call (3rd Attempt) Unsuccessful Call (1st Attempt) Date: 12/22/24 Unsuccessful Call (2nd Attempt) Date: 12/23/24 Unsuccessful Call (3rd Attempt) Date: 12/24/24  Attempted to reach the patient regarding the most recent Inpatient/ED visit.  Follow Up Plan: No further outreach attempts will be made at this time. We have been unable to contact the patient.  Signature Julian Lemmings, LPN [facility] Nurse Health Advisor Direct Dial [phone]  "

## 2024-12-25 ENCOUNTER — Other Ambulatory Visit: Payer: Self-pay

## 2024-12-25 DIAGNOSIS — R7989 Other specified abnormal findings of blood chemistry: Secondary | ICD-10-CM

## 2024-12-25 LAB — CULTURE, BLOOD (ROUTINE X 2)
Culture: NO GROWTH
Culture: NO GROWTH
Special Requests: ADEQUATE
Special Requests: ADEQUATE

## 2024-12-25 NOTE — Progress Notes (Signed)
 Pt has read results and I added lab work

## 2024-12-27 ENCOUNTER — Other Ambulatory Visit: Payer: Self-pay | Admitting: Family Medicine

## 2024-12-27 DIAGNOSIS — J302 Other seasonal allergic rhinitis: Secondary | ICD-10-CM

## 2024-12-28 ENCOUNTER — Other Ambulatory Visit: Payer: Self-pay | Admitting: Family Medicine

## 2024-12-28 DIAGNOSIS — E119 Type 2 diabetes mellitus without complications: Secondary | ICD-10-CM

## 2024-12-30 ENCOUNTER — Other Ambulatory Visit: Payer: Self-pay | Admitting: Family Medicine

## 2025-01-01 ENCOUNTER — Inpatient Hospital Stay: Admitting: Family Medicine

## 2025-01-13 ENCOUNTER — Ambulatory Visit

## 2025-01-14 ENCOUNTER — Other Ambulatory Visit: Admitting: Pharmacist

## 2025-01-27 ENCOUNTER — Ambulatory Visit: Admitting: Psychiatry

## 2025-03-12 ENCOUNTER — Ambulatory Visit: Admitting: Family Medicine
